# Patient Record
Sex: Male | Born: 1955 | Race: White | Hispanic: No | Marital: Married | Smoking: Never smoker
Health system: Southern US, Community
[De-identification: ages and names within clinical notes are randomized; demographics above are authoritative.]

## PROBLEM LIST (undated history)

## (undated) DIAGNOSIS — F419 Anxiety disorder, unspecified: Secondary | ICD-10-CM

## (undated) DIAGNOSIS — N529 Male erectile dysfunction, unspecified: Secondary | ICD-10-CM

## (undated) DIAGNOSIS — Z8739 Personal history of other diseases of the musculoskeletal system and connective tissue: Secondary | ICD-10-CM

## (undated) DIAGNOSIS — F32A Depression, unspecified: Secondary | ICD-10-CM

## (undated) DIAGNOSIS — E119 Type 2 diabetes mellitus without complications: Secondary | ICD-10-CM

## (undated) DIAGNOSIS — K219 Gastro-esophageal reflux disease without esophagitis: Secondary | ICD-10-CM

## (undated) DIAGNOSIS — I639 Cerebral infarction, unspecified: Secondary | ICD-10-CM

## (undated) DIAGNOSIS — R351 Nocturia: Secondary | ICD-10-CM

## (undated) DIAGNOSIS — R399 Unspecified symptoms and signs involving the genitourinary system: Secondary | ICD-10-CM

## (undated) DIAGNOSIS — E785 Hyperlipidemia, unspecified: Secondary | ICD-10-CM

## (undated) DIAGNOSIS — I10 Essential (primary) hypertension: Secondary | ICD-10-CM

## (undated) DIAGNOSIS — F329 Major depressive disorder, single episode, unspecified: Secondary | ICD-10-CM

## (undated) DIAGNOSIS — T387X1A Poisoning by androgens and anabolic congeners, accidental (unintentional), initial encounter: Secondary | ICD-10-CM

## (undated) DIAGNOSIS — E291 Testicular hypofunction: Secondary | ICD-10-CM

## (undated) DIAGNOSIS — M1990 Unspecified osteoarthritis, unspecified site: Secondary | ICD-10-CM

## (undated) DIAGNOSIS — N400 Enlarged prostate without lower urinary tract symptoms: Secondary | ICD-10-CM

## (undated) DIAGNOSIS — E559 Vitamin D deficiency, unspecified: Secondary | ICD-10-CM

## (undated) HISTORY — DX: Nocturia: R35.1

## (undated) HISTORY — PX: ROTATOR CUFF REPAIR: SHX139

## (undated) HISTORY — DX: Hyperlipidemia, unspecified: E78.5

## (undated) HISTORY — DX: Unspecified osteoarthritis, unspecified site: M19.90

## (undated) HISTORY — PX: COLONOSCOPY: SHX174

## (undated) HISTORY — DX: Personal history of other diseases of the musculoskeletal system and connective tissue: Z87.39

## (undated) HISTORY — DX: Poisoning by androgens and anabolic congeners, accidental (unintentional), initial encounter: T38.7X1A

## (undated) HISTORY — DX: Unspecified symptoms and signs involving the genitourinary system: R39.9

## (undated) HISTORY — DX: Depression, unspecified: F32.A

## (undated) HISTORY — DX: Vitamin D deficiency, unspecified: E55.9

## (undated) HISTORY — PX: COLON SURGERY: SHX602

## (undated) HISTORY — DX: Major depressive disorder, single episode, unspecified: F32.9

## (undated) HISTORY — PX: KNEE ARTHROSCOPY: SUR90

## (undated) HISTORY — DX: Anxiety disorder, unspecified: F41.9

## (undated) HISTORY — DX: Male erectile dysfunction, unspecified: N52.9

## (undated) HISTORY — DX: Testicular hypofunction: E29.1

## (undated) HISTORY — DX: Benign prostatic hyperplasia without lower urinary tract symptoms: N40.0

---

## 1984-11-06 HISTORY — PX: BACK SURGERY: SHX140

## 2005-10-23 ENCOUNTER — Inpatient Hospital Stay: Payer: Self-pay | Admitting: Unknown Physician Specialty

## 2006-07-26 ENCOUNTER — Ambulatory Visit: Payer: Self-pay | Admitting: Internal Medicine

## 2006-11-09 ENCOUNTER — Emergency Department: Payer: Self-pay | Admitting: Emergency Medicine

## 2007-05-23 ENCOUNTER — Ambulatory Visit: Payer: Self-pay | Admitting: Unknown Physician Specialty

## 2007-05-28 ENCOUNTER — Ambulatory Visit: Payer: Self-pay | Admitting: Unknown Physician Specialty

## 2007-07-01 ENCOUNTER — Ambulatory Visit: Payer: Self-pay | Admitting: Unknown Physician Specialty

## 2010-08-31 ENCOUNTER — Ambulatory Visit: Payer: Self-pay

## 2010-09-14 ENCOUNTER — Ambulatory Visit: Payer: Self-pay | Admitting: Unknown Physician Specialty

## 2012-09-13 ENCOUNTER — Other Ambulatory Visit: Payer: Self-pay | Admitting: Unknown Physician Specialty

## 2012-09-13 LAB — SYNOVIAL CELL COUNT + DIFF, W/ CRYSTALS
Crystals, Joint Fluid: NONE SEEN
Nucleated Cell Count: 134061 /mm3
Other Cells BF: 0 %
Other Mononuclear Cells: 3 %

## 2012-09-15 ENCOUNTER — Other Ambulatory Visit: Payer: Self-pay

## 2012-09-15 LAB — BODY FLUID CELL COUNT WITH DIFFERENTIAL
Basophil: 0 %
Eosinophil: 0 %
Lymphocytes: 14 %
Neutrophils: 79 %
Nucleated Cell Count: 66842 /mm3
Other Cells BF: 0 %

## 2012-09-15 LAB — SYNOVIAL FLUID, CRYSTAL: Crystals, Joint Fluid: NONE SEEN

## 2012-09-16 ENCOUNTER — Ambulatory Visit: Payer: Self-pay | Admitting: Unknown Physician Specialty

## 2012-09-17 ENCOUNTER — Ambulatory Visit: Payer: Self-pay | Admitting: Unknown Physician Specialty

## 2012-09-19 LAB — BODY FLUID CULTURE

## 2012-09-26 DIAGNOSIS — M009 Pyogenic arthritis, unspecified: Secondary | ICD-10-CM | POA: Insufficient documentation

## 2012-09-28 LAB — BODY FLUID CULTURE

## 2012-09-29 ENCOUNTER — Other Ambulatory Visit: Payer: Self-pay | Admitting: Specialist

## 2012-09-29 LAB — BASIC METABOLIC PANEL
Anion Gap: 6 — ABNORMAL LOW (ref 7–16)
BUN: 16 mg/dL (ref 7–18)
Calcium, Total: 9.4 mg/dL (ref 8.5–10.1)
Chloride: 105 mmol/L (ref 98–107)
Co2: 29 mmol/L (ref 21–32)
Creatinine: 0.82 mg/dL (ref 0.60–1.30)
Sodium: 140 mmol/L (ref 136–145)

## 2012-09-29 LAB — CBC WITH DIFFERENTIAL/PLATELET
Basophil #: 0 10*3/uL (ref 0.0–0.1)
Basophil %: 0.6 %
Eosinophil #: 0.2 10*3/uL (ref 0.0–0.7)
HCT: 36.5 % — ABNORMAL LOW (ref 40.0–52.0)
Lymphocyte #: 1.4 10*3/uL (ref 1.0–3.6)
MCH: 31.9 pg (ref 26.0–34.0)
MCV: 92 fL (ref 80–100)
Monocyte %: 9.5 %
Neutrophil #: 6 10*3/uL (ref 1.4–6.5)
Platelet: 322 10*3/uL (ref 150–440)
RDW: 11.8 % (ref 11.5–14.5)
WBC: 8.4 10*3/uL (ref 3.8–10.6)

## 2012-09-29 LAB — SEDIMENTATION RATE: Erythrocyte Sed Rate: 76 mm/hr — ABNORMAL HIGH (ref 0–20)

## 2012-09-29 LAB — VANCOMYCIN, TROUGH: Vancomycin, Trough: 12 ug/mL (ref 10–20)

## 2012-10-23 ENCOUNTER — Ambulatory Visit: Payer: Self-pay | Admitting: Unknown Physician Specialty

## 2013-09-12 ENCOUNTER — Ambulatory Visit: Payer: Self-pay | Admitting: Gastroenterology

## 2013-09-12 LAB — CBC WITH DIFFERENTIAL/PLATELET
Basophil #: 0 10*3/uL (ref 0.0–0.1)
Basophil %: 0.4 %
Eosinophil %: 4.2 %
HCT: 40.2 % (ref 40.0–52.0)
Lymphocyte #: 1.3 10*3/uL (ref 1.0–3.6)
MCH: 33 pg (ref 26.0–34.0)
MCHC: 36.1 g/dL — ABNORMAL HIGH (ref 32.0–36.0)
MCV: 92 fL (ref 80–100)
Monocyte #: 0.5 x10 3/mm (ref 0.2–1.0)
Neutrophil #: 2.9 10*3/uL (ref 1.4–6.5)
Neutrophil %: 58.3 %
Platelet: 181 10*3/uL (ref 150–440)

## 2013-09-12 LAB — PROTIME-INR: Prothrombin Time: 13.1 secs (ref 11.5–14.7)

## 2013-09-15 LAB — PATHOLOGY REPORT

## 2013-10-17 ENCOUNTER — Ambulatory Visit: Payer: Self-pay | Admitting: Anesthesiology

## 2013-10-17 LAB — POTASSIUM: Potassium: 3.8 mmol/L (ref 3.5–5.1)

## 2013-11-03 ENCOUNTER — Inpatient Hospital Stay: Payer: Self-pay | Admitting: Surgery

## 2013-11-03 LAB — POTASSIUM: Potassium: 3.8 mmol/L (ref 3.5–5.1)

## 2013-11-04 LAB — BASIC METABOLIC PANEL
Anion Gap: 5 — ABNORMAL LOW (ref 7–16)
BUN: 15 mg/dL (ref 7–18)
Calcium, Total: 9 mg/dL (ref 8.5–10.1)
Chloride: 98 mmol/L (ref 98–107)
Co2: 27 mmol/L (ref 21–32)
Creatinine: 0.81 mg/dL (ref 0.60–1.30)
EGFR (African American): >60
EGFR (Non-African Amer.): >60
Sodium: 130 mmol/L — ABNORMAL LOW (ref 136–145)

## 2013-11-04 LAB — CBC WITH DIFFERENTIAL/PLATELET
Basophil %: 0.1 %
Eosinophil #: 0 10*3/uL (ref 0.0–0.7)
HCT: 41 % (ref 40.0–52.0)
HGB: 14.2 g/dL (ref 13.0–18.0)
Lymphocyte %: 6.1 %
MCH: 32.3 pg (ref 26.0–34.0)
MCHC: 34.8 g/dL (ref 32.0–36.0)
MCV: 93 fL (ref 80–100)
Monocyte #: 1 x10 3/mm (ref 0.2–1.0)
Monocyte %: 7.7 %
Neutrophil #: 10.6 10*3/uL — ABNORMAL HIGH (ref 1.4–6.5)
Neutrophil %: 86.1 %
Platelet: 182 10*3/uL (ref 150–440)
RBC: 4.41 10*6/uL (ref 4.40–5.90)
WBC: 12.4 10*3/uL — ABNORMAL HIGH (ref 3.8–10.6)

## 2013-11-07 LAB — PATHOLOGY REPORT

## 2015-02-23 NOTE — Op Note (Signed)
PATIENT NAME:  Erik [name], Erik [name] MR#:  [id number] DATE OF BIRTH:  23-Jun-1956  DATE OF PROCEDURE:  09/17/2012  PREOPERATIVE DIAGNOSIS:  Septic left knee.  POSTOPERATIVE DIAGNOSIS: Septic left knee.  PROCEDURES:  1. Ultrasound guidance for vascular access to left brachial vein.  2. Fluoroscopic guidance for placement of catheter.  3. Insertion of peripherally inserted central venous catheter, 4-french single lumen, left arm.  SURGEON: Levora DredgeGregory Aldo Sondgeroth, MD  ANESTHESIA: Local.   ESTIMATED BLOOD LOSS: Minimal.   INDICATION FOR PROCEDURE: Requiring antibiotics greater than five days.  DESCRIPTION OF PROCEDURE: The patient's left arm was sterilely prepped and draped, and a sterile surgical field was created. The brachial vein was accessed under direct ultrasound guidance without difficulty with a micropuncture needle and permanent image was recorded. 0.018 wire was then placed into the superior vena cava. Peel-away sheath was placed over the wire. A single lumen peripherally inserted central venous catheter was then placed over the wire and the wire and peel-away sheath were removed. The catheter tip was placed into the superior             vena cava and was secured at the skin at 40 cm with a sterile dressing. The catheter withdrew blood well and flushed easily with heparinized saline. The patient tolerated procedure well. ____________________________ Renford DillsGregory G. Tannie Koskela, MD ggs:slb D: 09/17/2012 11:58:56 ET T: 09/17/2012 12:10:04 ET JOB#: [id number]  cc: Renford DillsGregory G. Nox Talent, MD, <Dictator> Alda BertholdHarold B. Kernodle Jr., MD Rosalyn GessMichael E. Blocker, MD Renford DillsGREGORY G Tiajah Oyster MD ELECTRONICALLY SIGNED 09/17/2012 17:15

## 2015-02-26 NOTE — Op Note (Signed)
PATIENT NAME:  Erik [name], Erik [name] MR#:  [id number] DATE OF BIRTH:  Feb 26, 1956  DATE OF PROCEDURE:  11/03/2013  PREOPERATIVE DIAGNOSIS: A polyp of the cecum, umbilical hernia.   PROCEDURE: Laparoscopic right colectomy, umbilical hernia repair.   SURGEON: Renda Rolls, M.D.   ANESTHESIA: General.   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 He also had a small umbilical hernia, and surgery was recommended for definitive treatment.   DESCRIPTION OF PROCEDURE: The patient was placed on the operating table in the supine position under general endotracheal anesthesia. The abdomen was prepared with ChloraPrep and draped in a sterile manner.   A short incision was made just below the umbilicus and carried down to the deep fascia which was grasped with laryngeal hook and elevated. A Veress needle was inserted, aspirated and irrigated with a saline solution.   Next, the peritoneal cavity was inflated with carbon dioxide. The Veress needle was removed. The 10 mm cannula was inserted. The 10 mm 0-degree laparoscope was inserted to view the peritoneal cavity.   Another incision was made in the epigastrium overlying the transverse colon to insert an 11 mm cannula, another just about 2.5 inches below that one. This was in the upper aspect of the epigastrium so that we had two 11 mm cannulas there, one below the navel, and another 5 mm cannula was inserted in the right lower quadrant.   Initial survey revealed the liver appeared normal. The omentum was reflected in a cephalad direction up to expose the transverse colon. Next, the cecum was identified. The patient was tilted towards the left and dissection was carried out to mobilize the right colon with incision of the lateral peritoneal reflection using the Harmonic scalpel. The appendix was identified and was mobilized, also mobilized the terminal ileum. There did appear to be  a number of attachments in this area and some scarring, and a somewhat tedious dissection was undertaken mobilizing the appendix, terminal ileum, and ascending colon.   Next, a portion of omentum was separated from the transverse colon beginning at the midline extending towards the right and then subsequently divided the omentum in a longitudinal direction somewhat to the right of the midline and then the hepatic flexure of the colon was mobilized with further dissection with the Harmonic scalpel and reaching the point of dissection along the right colic gutter. There was somewhat redundant colon in this area and it did take a somewhat tedious dissection mobilizing the hepatic flexure. Also identified the duodenum and continued to mobilize the right colon moving back and forth between the ascending colon and the transverse colon until there was satisfactory mobilization.   Next, the laparoscopic instruments were removed. The infraumbilical port site and the 5 mm port sites were closed with 4-0 nylon vertical mattress sutures.   Next, an incision was made from one port site to another in the epigastrium and also extended several centimeters inferiorly and this was still in the upper aspect of the epigastrium so that the midline fascia was incised and a hand could be put into the peritoneal cavity and brought the right colon out on to the abdominal wall.   Next, a window was created in the transverse mesentery to the right of the site of the middle colic vessels and began the mesenteric dissection with the Harmonic scalpel.   Next, a window was created in the mesentery of the small bowel just about 4 inches proximal  to the ileocecal valve. The mesenteric dissection was begun with the Harmonic scalpel, and this was carried deeply. I did widen the portion of mesentery removed so that approximately 6 inches of terminal ileum was removed in order to preserve blood supply at the intended anastomosis. The  dissection was continued with the harmonic scalpel finding somewhat large arteries including the ileocolic artery were ligated with 0 chromic and then dividing with the Harmonic scalpel, and also the ileocolic vein was ligated with 0 chromic suture ligature.   Next, the small bowel was brought adjacent to the transverse colon at the intended sites of resection and the small bowel was grasped with Allis clamp and also has a transverse colon grasped with Allis clamp at the tenia coli and brought the 2 portions of bowel side by side, made an enterotomy and a colotomy and introduced the 75 mm GIA stapler to begin the anastomosis along the antimesenteric border of the small bowel and at the tenia coli. The staple line was hemostatic. The anastomosis was completed with application of the TA-60 stapler which was placed perpendicular to the first staple line and the specimen was excised and passed off to a side table. Several small bleeding points were cauterized. The mesenteric defect was closed with running 3-0 chromic. The apex of the staple line was imbricated with 5-0 Vicryl and also a portion of the junction of the staple lines was imbricated with 5-0 Vicryl. The anastomosis looked good and the mesenteric defect was completely closed. A pull suction was placed into what was the right colic gutter and found no collection of blood. It appeared that hemostasis was intact.   Next, gloves, gown, instruments, suction and cautery were exchanged for clean ones and placed 4 sterile towels around the operative site.   Next, the umbilical hernia was repaired with making a transversely oriented supraumbilical incision which was curvilinear and approximately 2.5 cm in length and dissected down to encounter incarcerated properitoneal fatty tissue within the umbilical hernia, and this was dissected free from surrounding structures and was amputated with electrocautery. Hemostasis was intact and the fascial defect was closed  with a 0 Maxon figure-of-eight suture.   Next, this wound was closed with interrupted 4-0 nylon vertical mattress sutures.   Next, the extraction site in the upper abdominal midline was closed and it appeared that hemostasis was intact and instrument count was correct. The midline fascia was closed with interrupted 0 Maxon figure-of-eight sutures and the skin was closed with interrupted 4-0 nylon vertical mattress sutures.   Dressings were applied to all the wounds using 4 x 4 gauze, 2-inch paper tape.   The patient tolerated the procedure satisfactorily and was prepared for transfer to the recovery room.   This case took more than usual amount of timec greater than 3 hours and greater effort and skill due to significant amount of fatty tissue and a significant amount of redundancy involving the transverse colon in addition to the repair of the umbilical hernia.   It should be noted that at the end of the procedure, the segment of colon was incised to open the cecum and did expose the polyp in the cecum and was submitted in formalin for routine pathology.   ____________________________ Shela CommonsJ. Renda RollsWilton Samuel Rittenhouse, MD jws:np D: 11/03/2013 14:31:55 ET T: 11/03/2013 15:20:06 ET JOB#: [id number]  cc: Adella HareJ. Wilton Erandi Lemma, MD, <Dictator> Adella HareWILTON J Kaelan Amble MD ELECTRONICALLY SIGNED 11/05/2013 16:53

## 2015-02-27 NOTE — Discharge Summary (Signed)
PATIENT NAME:  Erik [name], [name] MR#:  [id number] DATE OF BIRTH:  August 20, 1956  DATE OF ADMISSION:  11/03/2013 DATE OF DISCHARGE:  11/06/2013  This 59 year old male had a recent colonoscopy with findings of a large polyp in the cecum. This was a 40 mm sessile tubular tubulovillous adenoma.   PAST MEDICAL HISTORY: Does include: 1.  Reflux esophagitis.  2.  Osteoarthritis.  3.  Depression and anxiety. 4.  Hyperlipidemia.  5.  History of hypertension in the past.  6.  Benign prostatic hypertrophy.   Details are recorded in the typed H and P.    PHYSICAL EXAMINATION:   VITAL SIGNS: BMI was 31, blood pressure 168/80.  ABDOMEN: With mild diastases recti. Also had 2 cm umbilical hernia which was partially reducible.   He was advised to have a laparoscopic right colectomy and also recommended umbilical hernia repair.   He had a bowel preparation at home and came in through the outpatient surgery department. He did have a preop prophylactic antibiotic. Was carried to the operating room where he had a laparoscopic right colectomy and also repair of umbilical hernia.   Postoperatively, he was treated with IV fluids, analgesics, subcutaneous heparin and was begun initially on a clear liquid diet and gradually advanced his diet, which he tolerated satisfactorily.   His final pathology demonstrated a tubulovillous adenoma of the cecum which was 2.8 cm in dimension with high-grade dysplasia. Twelve regional lymph nodes were negative. Appendix was included.   DIAGNOSES:   1.  Tubulovillous adenoma of the right colon. 2.  Umbilical hernia.   OPERATION: Laparoscopic right colectomy and umbilical hernia repair.   DISCHARGE INSTRUCTIONS: Wound care saturations were given and plans made for follow-up in the office.  ____________________________ J. Renda RollsWilton Smith, MD jws:dp D: 11/21/2013 14:08:05 ET T: 11/21/2013 15:31:19 ET JOB#: [id number]  cc: Adella HareJ. Wilton Smith, MD, <Dictator> Adella HareWILTON J SMITH  MD ELECTRONICALLY SIGNED 11/21/2013 20:02

## 2015-04-30 ENCOUNTER — Ambulatory Visit (INDEPENDENT_AMBULATORY_CARE_PROVIDER_SITE_OTHER): Payer: 59 | Admitting: Urology

## 2015-04-30 ENCOUNTER — Encounter: Payer: Self-pay | Admitting: Urology

## 2015-04-30 VITALS — BP 139/74 | HR 71 | Resp 18 | Ht 70.0 in | Wt 200.2 lb

## 2015-04-30 DIAGNOSIS — N400 Enlarged prostate without lower urinary tract symptoms: Secondary | ICD-10-CM | POA: Insufficient documentation

## 2015-04-30 DIAGNOSIS — K219 Gastro-esophageal reflux disease without esophagitis: Secondary | ICD-10-CM | POA: Insufficient documentation

## 2015-04-30 DIAGNOSIS — N138 Other obstructive and reflux uropathy: Secondary | ICD-10-CM | POA: Insufficient documentation

## 2015-04-30 DIAGNOSIS — F32A Depression, unspecified: Secondary | ICD-10-CM | POA: Insufficient documentation

## 2015-04-30 DIAGNOSIS — M1990 Unspecified osteoarthritis, unspecified site: Secondary | ICD-10-CM | POA: Insufficient documentation

## 2015-04-30 DIAGNOSIS — F419 Anxiety disorder, unspecified: Secondary | ICD-10-CM | POA: Insufficient documentation

## 2015-04-30 DIAGNOSIS — N401 Enlarged prostate with lower urinary tract symptoms: Secondary | ICD-10-CM

## 2015-04-30 DIAGNOSIS — I10 Essential (primary) hypertension: Secondary | ICD-10-CM | POA: Insufficient documentation

## 2015-04-30 DIAGNOSIS — E785 Hyperlipidemia, unspecified: Secondary | ICD-10-CM | POA: Insufficient documentation

## 2015-04-30 DIAGNOSIS — F329 Major depressive disorder, single episode, unspecified: Secondary | ICD-10-CM | POA: Insufficient documentation

## 2015-04-30 LAB — BLADDER SCAN AMB NON-IMAGING

## 2015-04-30 NOTE — Progress Notes (Signed)
04/30/2015 9:21 AM   Erik [name] 08/27/1956 [id number]  Referring provider: No referring provider defined for this encounter.  Chief Complaint  Patient presents with  . Medication Management    1 mo f/u    HPI: Erik [name] is a 60 year old white male with BPH with LUTS who was found to have a large residual at his appointment month ago. He denied any suprapubic pain but his IPS S score was 25/3 which is a severe score. He was experiencing incomplete bladder emptying, frequency, intermittency, urgency, weak stream and nocturia 4. We did discuss CIC at that appointment, but patient refused. He wanted to restart his Cialis 5 mg daily in an effort to aid in doing his bladder.  He presents today for follow-up.  His IPSS score improved. It is now 12/2 which is a moderate score.  His PVR is still remained large at 288 mL. He is not experiencing any suprapubic discomfort, urinary tract infections, dysuria or gross hematuria. He refuses CIC at this time.      IPSS      04/30/15 0900       International Prostate Symptom Score   How often have you had the sensation of not emptying your bladder? Less than half the time     How often have you had to urinate less than every two hours? Less than half the time     How often have you found you stopped and started again several times when you urinated? Less than half the time     How often have you found it difficult to postpone urination? Less than 1 in 5 times     How often have you had a weak urinary stream? Less than half the time     How often have you had to strain to start urination? Less than 1 in 5 times     How many times did you typically get up at night to urinate? 2 Times     Total IPSS Score 12     Quality of Life due to urinary symptoms   If you were to spend the rest of your life with your urinary condition just the way it is now how would you feel about that? 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  He has been advised of the dangers of abusing testosterone.    PMH: Past Medical History  Diagnosis Date  . Nocturia   . HLD (hyperlipidemia)   . ED (erectile dysfunction)   . Anxiety   . H/O calcium pyrophosphate deposition disease (CPPD)   . Lower urinary tract symptoms (LUTS)   . BPH (benign prostatic hyperplasia)   . Testosterone overdose   . Hypogonadism in male   . Vitamin D deficiency     Surgical History: Past Surgical History  Procedure Laterality Date  . Knee arthroscopy Bilateral     Home Medications:    Medication List       This list is accurate as of: 04/30/15  9:21 AM.  Always use your most recent med list.               amLODipine 10 MG tablet  Commonly known as:  NORVASC  Take 10 mg by mouth daily.     CRESTOR 40 MG tablet  Generic drug:  rosuvastatin  TAKE 1 TABLET (40 MG TOTAL) BY  MOUTH NIGHTLY.     FLUoxetine 10 MG capsule  Commonly known as:  PROZAC  Take 10 mg by mouth daily.     hydrochlorothiazide 25 MG tablet  Commonly known as:  HYDRODIURIL  TAKE 1 TABLET (25 MG TOTAL) BY MOUTH ONCE DAILY.     losartan 25 MG tablet  Commonly known as:  COZAAR  TAKE 1 TABLET (25 MG TOTAL) BY MOUTH ONCE DAILY.     multivitamin tablet  Take 1 tablet by mouth daily.     nabumetone 750 MG tablet  Commonly known as:  RELAFEN  Take 750 mg by mouth daily.     omeprazole 40 MG capsule  Commonly known as:  PRILOSEC  TAKE 1 CAPSULE (40 MG TOTAL) BY MOUTH ONCE DAILY.     phentermine 15 MG capsule  TAKE 1 CAPSULE BY MOUTH EVERY MORNING BEFORE BREAKFAST     tadalafil 5 MG tablet  Commonly known as:  CIALIS  Take 5 mg by mouth daily as needed for erectile dysfunction.        Allergies:  Allergies  Allergen Reactions  . Celecoxib Nausea And Vomiting  . Shellfish-Derived Products Hives    Family History: No family history on file.  Social History:  reports that he has  never smoked. He does not have any smokeless tobacco history on file. He reports that he drinks alcohol. His drug history is not on file.  ROS: Urological Symptom Review  Patient is experiencing the following symptoms: Frequent urination Get up at night to urinate Stream starts and stops   Review of Systems  Gastrointestinal (upper)  : Negative for upper GI symptoms  Gastrointestinal (lower) : Negative for lower GI symptoms  Constitutional : Negative for symptoms  Skin: Negative for skin symptoms  Eyes: Negative for eye symptoms  Ear/Nose/Throat : Negative for Ear/Nose/Throat symptoms  Hematologic/Lymphatic: Negative for Hematologic/Lymphatic symptoms  Cardiovascular : Negative for cardiovascular symptoms  Respiratory : Negative for respiratory symptoms  Endocrine: Negative for endocrine symptoms  Musculoskeletal: Negative for musculoskeletal symptoms  Neurological: Negative for neurological symptoms  Psychologic: Negative for psychiatric symptoms   Physical Exam: BP 139/74 mmHg  Pulse 71  Resp 18  Ht  (1.778 m)  Wt 200 lb 3.2 oz (90.81 kg)  BMI 28.73 kg/m2   Laboratory Data: Results for orders placed or performed in visit on 04/30/15  BLADDER SCAN AMB NON-IMAGING  Result Value Ref Range   Scan Result     Lab Results  Component Value Date   WBC 12.4* 11/04/2013   HGB 14.2 11/04/2013   HCT 41.0 11/04/2013   MCV 93 11/04/2013   PLT 182 11/04/2013    Lab Results  Component Value Date   CREATININE 0.81 11/04/2013    No results found for: PSA  No results found for: TESTOSTERONE  No results found for: HGBA1C  Urinalysis No results found for: COLORURINE, APPEARANCEUR, LABSPEC, PHURINE, GLUCOSEU, HGBUR, BILIRUBINUR, KETONESUR, PROTEINUR, UROBILINOGEN, NITRITE, LEUKOCYTESUR  Pertinent Imaging:   Assessment & Plan:    1. BPH (benign prostatic hyperplasia) with LUTS:  Patient continues to have large residuals, but his  urinary symptoms have improved. He does not want any further testing or to learn CIC at this time.  Patient was warned of the possibility of urinary retention and the dangers of having a high residual of urine. (Cr 0.86 on 03/30/2015)  He will continue the Cialis 5 mg 1 tablet daily. He will return in one years time for IPS S score,  PVR, DRE and PSA.  - BLADDER SCAN AMB NON-IMAGING  PSA History:    0.3 ng/mL on 03/03/2013    0.2 ng/mL on 03/30/2015  2. Testosterone abuse:  Patient has been advised of the risks of using testosterone without the supervision of a medical provider.  No Follow-up on file.  Michiel Cowboy, PA-C  [facility] [address] [place], [place] [address] [phone]

## 2016-04-28 ENCOUNTER — Ambulatory Visit: Payer: Managed Care, Other (non HMO) | Admitting: Urology

## 2016-04-28 ENCOUNTER — Encounter: Payer: Self-pay | Admitting: Urology

## 2017-02-04 DIAGNOSIS — E119 Type 2 diabetes mellitus without complications: Secondary | ICD-10-CM

## 2017-02-04 HISTORY — DX: Type 2 diabetes mellitus without complications: E11.9

## 2017-02-16 ENCOUNTER — Emergency Department (HOSPITAL_COMMUNITY): Payer: Managed Care, Other (non HMO)

## 2017-02-16 ENCOUNTER — Inpatient Hospital Stay (HOSPITAL_COMMUNITY): Payer: Managed Care, Other (non HMO)

## 2017-02-16 ENCOUNTER — Emergency Department (HOSPITAL_COMMUNITY): Payer: Managed Care, Other (non HMO) | Admitting: Certified Registered"

## 2017-02-16 ENCOUNTER — Inpatient Hospital Stay (HOSPITAL_COMMUNITY)
Admission: EM | Admit: 2017-02-16 | Discharge: 2017-02-22 | DRG: 023 | Disposition: A | Discharge status: Rehabilitation Facility | Payer: Managed Care, Other (non HMO) | Attending: Neurology | Admitting: Neurology

## 2017-02-16 ENCOUNTER — Encounter (HOSPITAL_COMMUNITY)
Admission: EM | Disposition: A | Discharge status: Rehabilitation Facility | Payer: Self-pay | Source: Home / Self Care | Attending: Neurology

## 2017-02-16 ENCOUNTER — Encounter (HOSPITAL_COMMUNITY): Payer: Self-pay | Admitting: Interventional Radiology

## 2017-02-16 DIAGNOSIS — E1165 Type 2 diabetes mellitus with hyperglycemia: Secondary | ICD-10-CM | POA: Diagnosis present

## 2017-02-16 DIAGNOSIS — I63032 Cerebral infarction due to thrombosis of left carotid artery: Secondary | ICD-10-CM | POA: Diagnosis not present

## 2017-02-16 DIAGNOSIS — R471 Dysarthria and anarthria: Secondary | ICD-10-CM | POA: Diagnosis present

## 2017-02-16 DIAGNOSIS — R402222 Coma scale, best verbal response, incomprehensible words, at arrival to emergency department: Secondary | ICD-10-CM | POA: Diagnosis present

## 2017-02-16 DIAGNOSIS — I63031 Cerebral infarction due to thrombosis of right carotid artery: Secondary | ICD-10-CM

## 2017-02-16 DIAGNOSIS — I6522 Occlusion and stenosis of left carotid artery: Secondary | ICD-10-CM | POA: Diagnosis not present

## 2017-02-16 DIAGNOSIS — Z8249 Family history of ischemic heart disease and other diseases of the circulatory system: Secondary | ICD-10-CM | POA: Diagnosis not present

## 2017-02-16 DIAGNOSIS — I69991 Dysphagia following unspecified cerebrovascular disease: Secondary | ICD-10-CM | POA: Diagnosis not present

## 2017-02-16 DIAGNOSIS — K219 Gastro-esophageal reflux disease without esophagitis: Secondary | ICD-10-CM | POA: Diagnosis present

## 2017-02-16 DIAGNOSIS — I10 Essential (primary) hypertension: Secondary | ICD-10-CM | POA: Diagnosis present

## 2017-02-16 DIAGNOSIS — N401 Enlarged prostate with lower urinary tract symptoms: Secondary | ICD-10-CM | POA: Diagnosis present

## 2017-02-16 DIAGNOSIS — I63312 Cerebral infarction due to thrombosis of left middle cerebral artery: Secondary | ICD-10-CM | POA: Diagnosis present

## 2017-02-16 DIAGNOSIS — R29810 Facial weakness: Secondary | ICD-10-CM | POA: Diagnosis present

## 2017-02-16 DIAGNOSIS — E785 Hyperlipidemia, unspecified: Secondary | ICD-10-CM | POA: Diagnosis present

## 2017-02-16 DIAGNOSIS — I639 Cerebral infarction, unspecified: Secondary | ICD-10-CM | POA: Diagnosis present

## 2017-02-16 DIAGNOSIS — I63512 Cerebral infarction due to unspecified occlusion or stenosis of left middle cerebral artery: Secondary | ICD-10-CM | POA: Diagnosis not present

## 2017-02-16 DIAGNOSIS — I63239 Cerebral infarction due to unspecified occlusion or stenosis of unspecified carotid arteries: Secondary | ICD-10-CM | POA: Diagnosis not present

## 2017-02-16 DIAGNOSIS — G936 Cerebral edema: Secondary | ICD-10-CM | POA: Diagnosis present

## 2017-02-16 DIAGNOSIS — R402362 Coma scale, best motor response, obeys commands, at arrival to emergency department: Secondary | ICD-10-CM | POA: Diagnosis present

## 2017-02-16 DIAGNOSIS — R351 Nocturia: Secondary | ICD-10-CM | POA: Diagnosis present

## 2017-02-16 DIAGNOSIS — I6529 Occlusion and stenosis of unspecified carotid artery: Secondary | ICD-10-CM | POA: Diagnosis not present

## 2017-02-16 DIAGNOSIS — R1310 Dysphagia, unspecified: Secondary | ICD-10-CM | POA: Diagnosis present

## 2017-02-16 DIAGNOSIS — I69320 Aphasia following cerebral infarction: Secondary | ICD-10-CM | POA: Diagnosis not present

## 2017-02-16 DIAGNOSIS — R4701 Aphasia: Secondary | ICD-10-CM

## 2017-02-16 DIAGNOSIS — N138 Other obstructive and reflux uropathy: Secondary | ICD-10-CM | POA: Diagnosis present

## 2017-02-16 DIAGNOSIS — Z6831 Body mass index (BMI) 31.0-31.9, adult: Secondary | ICD-10-CM | POA: Diagnosis not present

## 2017-02-16 DIAGNOSIS — R29726 NIHSS score 26: Secondary | ICD-10-CM | POA: Diagnosis present

## 2017-02-16 DIAGNOSIS — Z833 Family history of diabetes mellitus: Secondary | ICD-10-CM | POA: Diagnosis not present

## 2017-02-16 DIAGNOSIS — E669 Obesity, unspecified: Secondary | ICD-10-CM | POA: Diagnosis present

## 2017-02-16 DIAGNOSIS — E119 Type 2 diabetes mellitus without complications: Secondary | ICD-10-CM | POA: Diagnosis not present

## 2017-02-16 DIAGNOSIS — D62 Acute posthemorrhagic anemia: Secondary | ICD-10-CM | POA: Diagnosis not present

## 2017-02-16 DIAGNOSIS — G8191 Hemiplegia, unspecified affecting right dominant side: Secondary | ICD-10-CM | POA: Diagnosis present

## 2017-02-16 DIAGNOSIS — I6521 Occlusion and stenosis of right carotid artery: Secondary | ICD-10-CM | POA: Diagnosis not present

## 2017-02-16 DIAGNOSIS — I6523 Occlusion and stenosis of bilateral carotid arteries: Secondary | ICD-10-CM | POA: Diagnosis not present

## 2017-02-16 DIAGNOSIS — Z96653 Presence of artificial knee joint, bilateral: Secondary | ICD-10-CM | POA: Diagnosis present

## 2017-02-16 DIAGNOSIS — J9601 Acute respiratory failure with hypoxia: Secondary | ICD-10-CM | POA: Diagnosis not present

## 2017-02-16 DIAGNOSIS — R402142 Coma scale, eyes open, spontaneous, at arrival to emergency department: Secondary | ICD-10-CM | POA: Diagnosis present

## 2017-02-16 DIAGNOSIS — J9690 Respiratory failure, unspecified, unspecified whether with hypoxia or hypercapnia: Secondary | ICD-10-CM

## 2017-02-16 DIAGNOSIS — R001 Bradycardia, unspecified: Secondary | ICD-10-CM | POA: Diagnosis not present

## 2017-02-16 DIAGNOSIS — I69391 Dysphagia following cerebral infarction: Secondary | ICD-10-CM

## 2017-02-16 DIAGNOSIS — Z9911 Dependence on respirator [ventilator] status: Secondary | ICD-10-CM | POA: Diagnosis not present

## 2017-02-16 DIAGNOSIS — J9600 Acute respiratory failure, unspecified whether with hypoxia or hypercapnia: Secondary | ICD-10-CM | POA: Diagnosis not present

## 2017-02-16 DIAGNOSIS — I63232 Cerebral infarction due to unspecified occlusion or stenosis of left carotid arteries: Secondary | ICD-10-CM

## 2017-02-16 DIAGNOSIS — F809 Developmental disorder of speech and language, unspecified: Secondary | ICD-10-CM | POA: Diagnosis not present

## 2017-02-16 DIAGNOSIS — G8194 Hemiplegia, unspecified affecting left nondominant side: Secondary | ICD-10-CM | POA: Diagnosis not present

## 2017-02-16 DIAGNOSIS — I6789 Other cerebrovascular disease: Secondary | ICD-10-CM | POA: Diagnosis not present

## 2017-02-16 DIAGNOSIS — I459 Conduction disorder, unspecified: Secondary | ICD-10-CM | POA: Diagnosis not present

## 2017-02-16 DIAGNOSIS — N400 Enlarged prostate without lower urinary tract symptoms: Secondary | ICD-10-CM | POA: Diagnosis not present

## 2017-02-16 DIAGNOSIS — Z4659 Encounter for fitting and adjustment of other gastrointestinal appliance and device: Secondary | ICD-10-CM

## 2017-02-16 DIAGNOSIS — Z96659 Presence of unspecified artificial knee joint: Secondary | ICD-10-CM | POA: Diagnosis not present

## 2017-02-16 DIAGNOSIS — E1159 Type 2 diabetes mellitus with other circulatory complications: Secondary | ICD-10-CM | POA: Diagnosis not present

## 2017-02-16 HISTORY — PX: IR PTA NON CORO-LOWER EXTREM: IMG6142

## 2017-02-16 HISTORY — PX: IR PERCUTANEOUS ART THROMBECTOMY/INFUSION INTRACRANIAL INC DIAG ANGIO: IMG6087

## 2017-02-16 HISTORY — PX: RADIOLOGY WITH ANESTHESIA: SHX6223

## 2017-02-16 HISTORY — PX: IR US GUIDE VASC ACCESS LEFT: IMG2389

## 2017-02-16 HISTORY — PX: IR ANGIO INTRA EXTRACRAN SEL COM CAROTID INNOMINATE UNI R MOD SED: IMG5359

## 2017-02-16 HISTORY — PX: IR US GUIDE VASC ACCESS RIGHT: IMG2390

## 2017-02-16 LAB — I-STAT CHEM 8, ED
BUN: 24 mg/dL — AB (ref 6–20)
CHLORIDE: 101 mmol/L (ref 101–111)
Calcium, Ion: 1.16 mmol/L (ref 1.15–1.40)
Creatinine, Ser: 1 mg/dL (ref 0.61–1.24)
Glucose, Bld: 207 mg/dL — ABNORMAL HIGH (ref 65–99)
HEMATOCRIT: 39 % (ref 39.0–52.0)
Hemoglobin: 13.3 g/dL (ref 13.0–17.0)
Potassium: 3.6 mmol/L (ref 3.5–5.1)
SODIUM: 138 mmol/L (ref 135–145)
TCO2: 26 mmol/L (ref 0–100)

## 2017-02-16 LAB — CBC
HCT: 38.2 % — ABNORMAL LOW (ref 39.0–52.0)
HEMOGLOBIN: 13.2 g/dL (ref 13.0–17.0)
MCH: 31.4 pg (ref 26.0–34.0)
MCHC: 34.6 g/dL (ref 30.0–36.0)
MCV: 91 fL (ref 78.0–100.0)
PLATELETS: 207 10*3/uL (ref 150–400)
RBC: 4.2 MIL/uL — AB (ref 4.22–5.81)
RDW: 12.9 % (ref 11.5–15.5)
WBC: 6.6 10*3/uL (ref 4.0–10.5)

## 2017-02-16 LAB — COMPREHENSIVE METABOLIC PANEL
ALBUMIN: 4.4 g/dL (ref 3.5–5.0)
ALT: 29 U/L (ref 17–63)
ANION GAP: 11 (ref 5–15)
AST: 30 U/L (ref 15–41)
Alkaline Phosphatase: 33 U/L — ABNORMAL LOW (ref 38–126)
BUN: 22 mg/dL — AB (ref 6–20)
CO2: 23 mmol/L (ref 22–32)
Calcium: 9.3 mg/dL (ref 8.9–10.3)
Chloride: 102 mmol/L (ref 101–111)
Creatinine, Ser: 0.97 mg/dL (ref 0.61–1.24)
GFR calc Af Amer: >60 mL/min (ref >60)
GFR calc non Af Amer: >60 mL/min (ref >60)
GLUCOSE: 206 mg/dL — AB (ref 65–99)
POTASSIUM: 3.7 mmol/L (ref 3.5–5.1)
SODIUM: 136 mmol/L (ref 135–145)
Total Bilirubin: 0.6 mg/dL (ref 0.3–1.2)
Total Protein: 6.9 g/dL (ref 6.5–8.1)

## 2017-02-16 LAB — POCT I-STAT 3, ART BLOOD GAS (G3+)
BICARBONATE: 25.8 mmol/L (ref 20.0–28.0)
O2 Saturation: 99 %
PH ART: 7.346 — AB (ref 7.350–7.450)
PO2 ART: 161 mmHg — AB (ref 83.0–108.0)
Patient temperature: 98.6
TCO2: 27 mmol/L (ref 0–100)
pCO2 arterial: 47.2 mmHg (ref 32.0–48.0)

## 2017-02-16 LAB — DIFFERENTIAL
BASOS ABS: 0 10*3/uL (ref 0.0–0.1)
Basophils Relative: 0 %
EOS ABS: 0.1 10*3/uL (ref 0.0–0.7)
EOS PCT: 2 %
LYMPHS ABS: 2.4 10*3/uL (ref 0.7–4.0)
LYMPHS PCT: 36 %
Monocytes Absolute: 0.8 10*3/uL (ref 0.1–1.0)
Monocytes Relative: 13 %
NEUTROS PCT: 49 %
Neutro Abs: 3.3 10*3/uL (ref 1.7–7.7)

## 2017-02-16 LAB — LIPID PANEL
CHOLESTEROL: 135 mg/dL (ref 0–200)
HDL: 28 mg/dL — ABNORMAL LOW (ref >40)
LDL Cholesterol: 78 mg/dL (ref 0–99)
Total CHOL/HDL Ratio: 4.8 RATIO
Triglycerides: 144 mg/dL (ref <150)
VLDL: 29 mg/dL (ref 0–40)

## 2017-02-16 LAB — HIV ANTIBODY (ROUTINE TESTING W REFLEX): HIV SCREEN 4TH GENERATION: NONREACTIVE

## 2017-02-16 LAB — PROTIME-INR
INR: 0.97
PROTHROMBIN TIME: 12.9 s (ref 11.4–15.2)

## 2017-02-16 LAB — APTT: APTT: 22 s — AB (ref 24–36)

## 2017-02-16 LAB — CBG MONITORING, ED
GLUCOSE-CAPILLARY: 206 mg/dL — AB (ref 65–99)
GLUCOSE-CAPILLARY: 238 mg/dL — AB (ref 65–99)

## 2017-02-16 LAB — GLUCOSE, CAPILLARY
GLUCOSE-CAPILLARY: 138 mg/dL — AB (ref 65–99)
GLUCOSE-CAPILLARY: 110 mg/dL — AB (ref 65–99)
Glucose-Capillary: 160 mg/dL — ABNORMAL HIGH (ref 65–99)

## 2017-02-16 LAB — MRSA PCR SCREENING: MRSA by PCR: NEGATIVE

## 2017-02-16 LAB — HEPARIN LEVEL (UNFRACTIONATED): HEPARIN UNFRACTIONATED: 0.2 [IU]/mL — AB (ref 0.30–0.70)

## 2017-02-16 LAB — I-STAT TROPONIN, ED: Troponin i, poc: 0 ng/mL (ref 0.00–0.08)

## 2017-02-16 SURGERY — RADIOLOGY WITH ANESTHESIA
Anesthesia: General

## 2017-02-16 MED ORDER — SODIUM CHLORIDE 0.9 % IV BOLUS (SEPSIS)
500.0000 mL | Freq: Once | INTRAVENOUS | Status: AC
  Administered 2017-02-16: 500 mL via INTRAVENOUS

## 2017-02-16 MED ORDER — SODIUM CHLORIDE 0.9 % IV SOLN: 50.0000 mL | Freq: Once | INTRAVENOUS | Status: DC

## 2017-02-16 MED ORDER — STROKE: EARLY STAGES OF RECOVERY BOOK
Freq: Once | Status: AC
  Administered 2017-02-16: 06:00:00
  Filled 2017-02-16: qty 1

## 2017-02-16 MED ORDER — PROPOFOL 10 MG/ML IV BOLUS
INTRAVENOUS | Status: DC | PRN
  Administered 2017-02-16: 100 mg via INTRAVENOUS
  Administered 2017-02-16: 50 mg via INTRAVENOUS
  Administered 2017-02-16: 150 mg via INTRAVENOUS

## 2017-02-16 MED ORDER — ACETAMINOPHEN 325 MG PO TABS
650.0000 mg | ORAL_TABLET | ORAL | Status: DC | PRN
Start: 2017-02-16 — End: 2017-02-18

## 2017-02-16 MED ORDER — PROPOFOL 10 MG/ML IV BOLUS
INTRAVENOUS | Status: AC
  Filled 2017-02-16: qty 20

## 2017-02-16 MED ORDER — CHLORHEXIDINE GLUCONATE 0.12% ORAL RINSE (MEDLINE KIT)
15.0000 mL | Freq: Two times a day (BID) | OROMUCOSAL | Status: DC
  Administered 2017-02-16 – 2017-02-17 (×3): 15 mL via OROMUCOSAL

## 2017-02-16 MED ORDER — NITROGLYCERIN 1 MG/10 ML FOR IR/CATH LAB
INTRA_ARTERIAL | Status: AC
  Filled 2017-02-16: qty 10

## 2017-02-16 MED ORDER — NICARDIPINE HCL IN NACL 20-0.86 MG/200ML-% IV SOLN: 0.0000 mg/h | INTRAVENOUS | Status: DC

## 2017-02-16 MED ORDER — ACETAMINOPHEN 160 MG/5ML PO SOLN: 650.0000 mg | ORAL | Status: DC | PRN

## 2017-02-16 MED ORDER — SUFENTANIL CITRATE 50 MCG/ML IV SOLN
INTRAVENOUS | Status: AC
  Filled 2017-02-16: qty 1

## 2017-02-16 MED ORDER — EPTIFIBATIDE 20 MG/10ML IV SOLN
INTRAVENOUS | Status: AC
  Filled 2017-02-16: qty 10

## 2017-02-16 MED ORDER — OXYCODONE HCL 5 MG/5ML PO SOLN
5.0000 mg | Freq: Once | ORAL | Status: DC | PRN
Start: 2017-02-16 — End: 2017-02-16

## 2017-02-16 MED ORDER — ALBUMIN HUMAN 5 % IV SOLN
12.5000 g | Freq: Four times a day (QID) | INTRAVENOUS | Status: AC
  Administered 2017-02-16 – 2017-02-17 (×4): 12.5 g via INTRAVENOUS
  Filled 2017-02-16 (×4): qty 250

## 2017-02-16 MED ORDER — IOPAMIDOL (ISOVUE-300) INJECTION 61%
INTRAVENOUS | Status: AC
  Administered 2017-02-16: 90 mL
  Filled 2017-02-16: qty 150

## 2017-02-16 MED ORDER — SODIUM CHLORIDE 0.9 % IV SOLN
INTRAVENOUS | Status: DC
  Administered 2017-02-16: 07:00:00 via INTRAVENOUS

## 2017-02-16 MED ORDER — SUFENTANIL CITRATE 50 MCG/ML IV SOLN
INTRAVENOUS | Status: DC | PRN
  Administered 2017-02-16 (×2): 20 ug via INTRAVENOUS
  Administered 2017-02-16: 10 ug via INTRAVENOUS

## 2017-02-16 MED ORDER — ONDANSETRON HCL 4 MG/2ML IJ SOLN: 4.0000 mg | Freq: Four times a day (QID) | INTRAMUSCULAR | Status: DC | PRN

## 2017-02-16 MED ORDER — PANTOPRAZOLE SODIUM 40 MG IV SOLR
40.0000 mg | Freq: Every day | INTRAVENOUS | Status: DC
  Administered 2017-02-16 – 2017-02-17 (×2): 40 mg via INTRAVENOUS
  Filled 2017-02-16 (×2): qty 40

## 2017-02-16 MED ORDER — IOPAMIDOL (ISOVUE-370) INJECTION 76%
INTRAVENOUS | Status: AC
  Administered 2017-02-16: 50 mL
  Filled 2017-02-16: qty 50

## 2017-02-16 MED ORDER — SODIUM CHLORIDE 0.9 % IV SOLN: 250.0000 mL | INTRAVENOUS | Status: DC | PRN

## 2017-02-16 MED ORDER — LACTATED RINGERS IV SOLN
INTRAVENOUS | Status: DC | PRN
  Administered 2017-02-16 (×2): via INTRAVENOUS

## 2017-02-16 MED ORDER — INSULIN ASPART 100 UNIT/ML ~~LOC~~ SOLN
0.0000 [IU] | Freq: Three times a day (TID) | SUBCUTANEOUS | Status: DC
  Administered 2017-02-16: 2 [IU] via SUBCUTANEOUS
  Administered 2017-02-16: 3 [IU] via SUBCUTANEOUS
  Administered 2017-02-17: 2 [IU] via SUBCUTANEOUS

## 2017-02-16 MED ORDER — ALTEPLASE (STROKE) FULL DOSE INFUSION
0.9000 mg/kg | Freq: Once | INTRAVENOUS | Status: AC
  Administered 2017-02-16: 90 mg via INTRAVENOUS
  Filled 2017-02-16: qty 100

## 2017-02-16 MED ORDER — ASPIRIN 325 MG PO TABS
ORAL_TABLET | ORAL | Status: AC
  Filled 2017-02-16: qty 1

## 2017-02-16 MED ORDER — PHENYLEPHRINE HCL 10 MG/ML IJ SOLN
INTRAVENOUS | Status: DC | PRN
  Administered 2017-02-16: 6.6 ug/min via INTRAVENOUS

## 2017-02-16 MED ORDER — LIDOCAINE HCL (CARDIAC) 20 MG/ML IV SOLN
INTRAVENOUS | Status: DC | PRN
  Administered 2017-02-16: 100 mg via INTRATRACHEAL

## 2017-02-16 MED ORDER — OXYCODONE HCL 5 MG PO TABS: 5.0000 mg | ORAL_TABLET | Freq: Once | ORAL | Status: DC | PRN

## 2017-02-16 MED ORDER — FENTANYL CITRATE (PF) 100 MCG/2ML IJ SOLN: 100.0000 ug | INTRAMUSCULAR | Status: DC | PRN

## 2017-02-16 MED ORDER — INSULIN ASPART 100 UNIT/ML ~~LOC~~ SOLN: 0.0000 [IU] | Freq: Every day | SUBCUTANEOUS | Status: DC

## 2017-02-16 MED ORDER — SUCCINYLCHOLINE CHLORIDE 20 MG/ML IJ SOLN
INTRAMUSCULAR | Status: DC | PRN
  Administered 2017-02-16: 50 mg via INTRAVENOUS
  Administered 2017-02-16: 120 mg via INTRAVENOUS

## 2017-02-16 MED ORDER — ACETAMINOPHEN 650 MG RE SUPP: 650.0000 mg | RECTAL | Status: DC | PRN

## 2017-02-16 MED ORDER — ORAL CARE MOUTH RINSE
15.0000 mL | OROMUCOSAL | Status: DC
  Administered 2017-02-16 – 2017-02-17 (×15): 15 mL via OROMUCOSAL

## 2017-02-16 MED ORDER — ALTEPLASE (STROKE) FULL DOSE INFUSION
0.9000 mg/kg | Freq: Once | INTRAVENOUS | Status: DC
  Administered 2017-02-16: 81 mg via INTRAVENOUS

## 2017-02-16 MED ORDER — PROPOFOL 1000 MG/100ML IV EMUL
INTRAVENOUS | Status: AC
  Administered 2017-02-16: 25 ug/kg/min via INTRAVENOUS
  Filled 2017-02-16: qty 100

## 2017-02-16 MED ORDER — CLOPIDOGREL BISULFATE 300 MG PO TABS
ORAL_TABLET | ORAL | Status: AC
Start: 2017-02-16 — End: 2017-02-16
  Filled 2017-02-16: qty 1

## 2017-02-16 MED ORDER — PROPOFOL 1000 MG/100ML IV EMUL
0.0000 ug/kg/min | INTRAVENOUS | Status: DC
  Administered 2017-02-16: 25 ug/kg/min via INTRAVENOUS
  Administered 2017-02-16: 50 ug/kg/min via INTRAVENOUS
  Administered 2017-02-16: 25 ug/kg/min via INTRAVENOUS
  Administered 2017-02-17: 50 ug/kg/min via INTRAVENOUS
  Administered 2017-02-17: 25 ug/kg/min via INTRAVENOUS
  Filled 2017-02-16 (×5): qty 100

## 2017-02-16 MED ORDER — ROCURONIUM 10MG/ML (10ML) SYRINGE FOR MEDFUSION PUMP - OPTIME
INTRAVENOUS | Status: DC | PRN
  Administered 2017-02-16 (×3): 50 mg via INTRAVENOUS

## 2017-02-16 MED ORDER — FENTANYL CITRATE (PF) 100 MCG/2ML IJ SOLN: 25.0000 ug | INTRAMUSCULAR | Status: DC | PRN

## 2017-02-16 MED ORDER — ROSUVASTATIN CALCIUM 20 MG PO TABS
40.0000 mg | ORAL_TABLET | Freq: Every day | ORAL | Status: DC
  Administered 2017-02-16 – 2017-02-18 (×3): 40 mg
  Filled 2017-02-16 (×3): qty 1

## 2017-02-16 MED ORDER — SUFENTANIL CITRATE 50 MCG/ML IV SOLN
INTRAVENOUS | Status: AC
Start: 2017-02-16 — End: 2017-02-16
  Filled 2017-02-16: qty 1

## 2017-02-16 NOTE — ED Provider Notes (Signed)
[facility]-EMERGENCY DEPT Provider Note   CSN: [id number] Arrival date & time: 02/16/17  0037  By signing my name below, I, Octavia Heir, attest that this documentation has been prepared under the direction and in the presence of Aalyssa Elderkin, MD.  Electronically Signed: Octavia Heir, ED Scribe. 02/16/17. 1:18 AM.    History   Chief Complaint No chief complaint on file.  LEVEL V CAVEAT: HPI and ROS limited due to acuity of condition.  The history is provided by the EMS personnel and the spouse. No language interpreter was used.  Cerebrovascular Accident  This is a new problem. The current episode started 1 to 2 hours ago. The problem occurs rarely. The problem has been gradually improving. Pertinent negatives include no chest pain. Nothing aggravates the symptoms. Nothing relieves the symptoms. He has tried nothing for the symptoms. The treatment provided no relief.   HPI Comments: Erik [name] is a 61 y.o. male brought in by ambulance, who presents to the Emergency Department presenting with as a code stroke. Last seen normal was ~ 11:30pm. Per EMS, pt was found in the bathroom laying on his knees. Per wife and EMS, pt is having significant left sided facial droop and aphasia.   Past Medical History:  Diagnosis Date  . Anxiety   . BPH (benign prostatic hyperplasia)   . ED (erectile dysfunction)   . H/O calcium pyrophosphate deposition disease (CPPD)   . HLD (hyperlipidemia)   . Hypogonadism in male   . Lower urinary tract symptoms (LUTS)   . Nocturia   . Testosterone overdose   . Vitamin D deficiency     Patient Active Problem List   Diagnosis Date Noted  . Anxiety 04/30/2015  . Benign fibroma of prostate 04/30/2015  . Clinical depression 04/30/2015  . Acid reflux 04/30/2015  . HLD (hyperlipidemia) 04/30/2015  . BP (high blood pressure) 04/30/2015  . Arthritis, degenerative 04/30/2015  . 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 Arthritis,  septic, knee (HCC) 09/26/2012    Past Surgical History:  Procedure Laterality Date  . KNEE ARTHROSCOPY Bilateral        Home Medications    Prior to Admission medications   Medication Sig Start Date End Date Taking? Authorizing Provider  amLODipine (NORVASC) 10 MG tablet Take 10 mg by mouth daily. 03/24/15   Historical Provider, MD  CRESTOR 40 MG tablet TAKE 1 TABLET (40 MG TOTAL) BY MOUTH NIGHTLY. 03/24/15   Historical Provider, MD  FLUoxetine (PROZAC) 10 MG capsule Take 10 mg by mouth daily.    Historical Provider, MD  hydrochlorothiazide (HYDRODIURIL) 25 MG tablet TAKE 1 TABLET (25 MG TOTAL) BY MOUTH ONCE DAILY. 03/24/15   Historical Provider, MD  losartan (COZAAR) 25 MG tablet TAKE 1 TABLET (25 MG TOTAL) BY MOUTH ONCE DAILY. 03/27/15   Historical Provider, MD  Multiple Vitamin (MULTIVITAMIN) tablet Take 1 tablet by mouth daily.    Historical Provider, MD  nabumetone (RELAFEN) 750 MG tablet Take 750 mg by mouth daily.    Historical Provider, MD  omeprazole (PRILOSEC) 40 MG capsule TAKE 1 CAPSULE (40 MG TOTAL) BY MOUTH ONCE DAILY. 03/24/15   Historical Provider, MD  phentermine 15 MG capsule TAKE 1 CAPSULE BY MOUTH EVERY MORNING BEFORE BREAKFAST 04/10/15   Historical Provider, MD  tadalafil (CIALIS) 5 MG tablet Take 5 mg by mouth daily as needed for erectile dysfunction.    Historical Provider, MD    Family History No family history on file.  Social History Social  History  Substance Use Topics  . Smoking status: Never Smoker  . Smokeless tobacco: Not on file  . Alcohol use Yes     Allergies   Celecoxib and Shellfish-derived products   Review of Systems Review of Systems  Unable to perform ROS: Acuity of condition  Cardiovascular: Negative for chest pain.  Musculoskeletal: Negative for back pain.    LEVEL V CAVEAT: HPI and ROS limited due to acuity condition of patient  Physical Exam Updated Vital Signs There were no vitals taken for this visit.  Physical Exam    Constitutional: He appears well-developed and well-nourished.  HENT:  Head: Normocephalic and atraumatic.  Mouth/Throat: Oropharynx is clear and moist. No oropharyngeal exudate.  Moist mucous membranes. No exudates. Drooling actively  Eyes: Conjunctivae and EOM are normal. Pupils are equal, round, and reactive to light.  Neck: Normal range of motion. Neck supple. No JVD present. No tracheal deviation present.  No carotid bruits. Trachea midline.   Cardiovascular: Normal rate, regular rhythm, normal heart sounds and intact distal pulses.  Exam reveals no gallop and no friction rub.   No murmur heard. RRR.   Pulmonary/Chest: Effort normal and breath sounds normal. No stridor. No respiratory distress. He has no wheezes. He has no rales.  Lungs CTA bilaterally.   Abdominal: Soft. Bowel sounds are normal. He exhibits no distension and no mass. There is no tenderness. There is no rebound and no guarding.  Genitourinary:  Genitourinary Comments: Rectal tone intact, hemoccult negative, normal looking genitals   Musculoskeletal: Normal range of motion. He exhibits no edema.  Lymphadenopathy:    He has no cervical adenopathy.  Neurological: He is alert. He has normal reflexes. He displays normal reflexes. Coordination normal.  GCS:  2- speech 4- eyes 5/6-movement  babinski on the left Significant dysarthria  2+ DTRs intact  Skin: Skin is warm and dry. Capillary refill takes less than 2 seconds.  Psychiatric: He has a normal mood and affect.  Nursing note and vitals reviewed.    ED Treatments / Results   Vitals:   02/16/17 0126 02/16/17 0200  BP:  (!) 154/75  Pulse: 81 78  Resp: 19 (!) 22    COORDINATION OF CARE:  1:07 AM Discussed treatment plan with staff at bedside and staff agreed to plan.  Labs (all labs ordered are listed, but only abnormal results are displayed) Results for orders placed or performed during the hospital encounter of 02/16/17  Protime-INR  Result Value  Ref Range   Prothrombin Time 12.9 11.4 - 15.2 seconds   INR 0.97   APTT  Result Value Ref Range   aPTT 22 (L) 24 - 36 seconds  CBC  Result Value Ref Range   WBC 6.6 4.0 - 10.5 K/uL   RBC 4.20 (L) 4.22 - 5.81 MIL/uL   Hemoglobin 13.2 13.0 - 17.0 g/dL   HCT 16.1 (L) 09.6 - 04.5 %   MCV 91.0 78.0 - 100.0 fL   MCH 31.4 26.0 - 34.0 pg   MCHC 34.6 30.0 - 36.0 g/dL   RDW 40.9 81.1 - 91.4 %   Platelets 207 150 - 400 K/uL  Differential  Result Value Ref Range   Neutrophils Relative % 49 %   Neutro Abs 3.3 1.7 - 7.7 K/uL   Lymphocytes Relative 36 %   Lymphs Abs 2.4 0.7 - 4.0 K/uL   Monocytes Relative 13 %   Monocytes Absolute 0.8 0.1 - 1.0 K/uL   Eosinophils Relative 2 %   Eosinophils  Absolute 0.1 0.0 - 0.7 K/uL   Basophils Relative 0 %   Basophils Absolute 0.0 0.0 - 0.1 K/uL  Comprehensive metabolic panel  Result Value Ref Range   Sodium 136 135 - 145 mmol/L   Potassium 3.7 3.5 - 5.1 mmol/L   Chloride 102 101 - 111 mmol/L   CO2 23 22 - 32 mmol/L   Glucose, Bld 206 (H) 65 - 99 mg/dL   BUN 22 (H) 6 - 20 mg/dL   Creatinine, Ser 1.61 0.61 - 1.24 mg/dL   Calcium 9.3 8.9 - 09.6 mg/dL   Total Protein 6.9 6.5 - 8.1 g/dL   Albumin 4.4 3.5 - 5.0 g/dL   AST 30 15 - 41 U/L   ALT 29 17 - 63 U/L   Alkaline Phosphatase 33 (L) 38 - 126 U/L   Total Bilirubin 0.6 0.3 - 1.2 mg/dL   GFR calc non Af Amer >60 >60 mL/min   GFR calc Af Amer >60 >60 mL/min   Anion gap 11 5 - 15  I-stat troponin, ED  Result Value Ref Range   Troponin i, poc 0.00 0.00 - 0.08 ng/mL   Comment 3          CBG monitoring, ED  Result Value Ref Range   Glucose-Capillary 206 (H) 65 - 99 mg/dL  I-Stat Chem 8, ED  Result Value Ref Range   Sodium 138 135 - 145 mmol/L   Potassium 3.6 3.5 - 5.1 mmol/L   Chloride 101 101 - 111 mmol/L   BUN 24 (H) 6 - 20 mg/dL   Creatinine, Ser 0.45 0.61 - 1.24 mg/dL   Glucose, Bld 409 (H) 65 - 99 mg/dL   Calcium, Ion 8.11 9.14 - 1.40 mmol/L   TCO2 26 0 - 100 mmol/L   Hemoglobin 13.3  13.0 - 17.0 g/dL   HCT 78.2 95.6 - 21.3 %  CBG monitoring, ED  Result Value Ref Range   Glucose-Capillary 238 (H) 65 - 99 mg/dL   Ct Angio Head W Or Wo Contrast  Result Date: 02/16/2017 CLINICAL DATA:  Right-sided weakness and aphasia EXAM: CT ANGIOGRAPHY HEAD AND NECK TECHNIQUE: Multidetector CT imaging of the head and neck was performed using the standard protocol during bolus administration of intravenous contrast. Multiplanar CT image reconstructions and MIPs were obtained to evaluate the vascular anatomy. Carotid stenosis measurements (when applicable) are obtained utilizing NASCET criteria, using the distal internal carotid diameter as the denominator. CONTRAST:  50 mL Isovue 370 IV COMPARISON:  Head CT 02/16/2017 FINDINGS: CTA NECK FINDINGS Aortic arch: There is no aneurysm or dissection of the visualized ascending aorta or aortic arch. There is a normal 3 vessel branching pattern. The visualized proximal subclavian arteries are widely patent. There is aortic atherosclerotic calcification. Right carotid system: There is multifocal atherosclerotic calcification within the right common carotid artery. There is mixed calcified and noncalcified plaque within the proximal right internal carotid artery with at least 90% stenosis. The distal right ICA is normal. Left carotid system: There is calcification at the origin of the left common carotid artery. The left internal carotid artery is occluded at its origin and along the remainder of its cervical course to the skullbase. There is return of contrast enhancement at the distal petrous segment. Vertebral arteries: The vertebral system is codominant. Both vertebral artery origins are patent. There is multifocal irregular narrowing of the left vertebral artery without hemodynamically significant stenosis. Both V3 and V4 segments are normal. The vertebrobasilar confluence is normal. Skeleton: There is  no bony spinal canal stenosis. No lytic or blastic lesions.  Other neck: The nasopharynx is clear. The oropharynx and hypopharynx are normal. The epiglottis is normal. The supraglottic larynx, glottis and subglottic larynx are normal. No retropharyngeal collection. The parapharyngeal spaces are preserved. The parotid and submandibular glands are normal. No sialolithiasis or salivary ductal dilatation. The thyroid gland is normal. There is no cervical lymphadenopathy. Upper chest: No pneumothorax or pleural effusion. No nodules or masses. Review of the MIP images confirms the above findings CTA HEAD FINDINGS Anterior circulation: --Intracranial internal carotid arteries: The left ICA is occluded without opacification of the proximal petrous segment. The distal petrous segment and the remainder of the intracranial left ICA are opacified. There is mild atherosclerotic calcification of the cavernous and clinoid segments. The right side is normal. --Anterior cerebral arteries: Normal. --Middle cerebral arteries: There is complete occlusion of the left middle cerebral artery at its origin. No enhancement of the M1 or and 2 segments is seen. The distal small vessels in the left MCA distribution are relatively symmetric compared to the right. The right MCA is normal. --Posterior communicating arteries: Present bilaterally. Posterior circulation: --Posterior cerebral arteries: Normal. --Superior cerebellar arteries: Normal. --Basilar artery: Normal. --Anterior inferior cerebellar arteries: Not clearly visualized. --Posterior inferior cerebellar arteries: Normal. Venous sinuses: As permitted by contrast timing, patent. Anatomic variants: None Delayed phase: Not performed. Review of the MIP images confirms the above findings IMPRESSION: 1. 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 Distal collateral flow within the left MCA distribution persists and is relatively symmetric to the right side. 2. 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 3. At least 90% stenosis at the origin of the right internal carotid artery secondary to mixed calcified and noncalcified plaque. Critical Value/emergent results were called by telephone at the time of interpretation on 02/16/2017 at 1:25 am to Dr. Ritta Slot , who verbally acknowledged these results. Electronically Signed   By: Deatra Robinson M.D.   On: 02/16/2017 01:47   Ct Angio Neck W Or Wo Contrast  Result Date: 02/16/2017 CLINICAL DATA:  Right-sided weakness and aphasia EXAM: CT ANGIOGRAPHY HEAD AND NECK TECHNIQUE: Multidetector CT imaging of the head and neck was performed using the standard protocol during bolus administration of intravenous contrast. Multiplanar CT image reconstructions and MIPs were obtained to evaluate the vascular anatomy. Carotid stenosis measurements (when applicable) are obtained utilizing NASCET criteria, using the distal internal carotid diameter as the denominator. CONTRAST:  50 mL Isovue 370 IV COMPARISON:  Head CT 02/16/2017 FINDINGS: CTA NECK FINDINGS Aortic arch: There is no aneurysm or dissection of the visualized ascending aorta or aortic arch. There is a normal 3 vessel branching pattern. The visualized proximal subclavian arteries are widely patent. There is aortic atherosclerotic calcification. Right carotid system: There is multifocal atherosclerotic calcification within the right common carotid artery. There is mixed calcified and noncalcified plaque within the proximal right internal carotid artery with at least 90% stenosis. The distal right ICA is normal. Left carotid system: There is calcification at the origin of the left common carotid artery. The left internal carotid artery is occluded at its origin and along the remainder of its cervical course to the skullbase. There is return of contrast enhancement at the distal petrous segment.  Vertebral arteries: The vertebral system is codominant. Both vertebral artery origins are patent. There  is multifocal irregular narrowing of the left vertebral artery without hemodynamically significant stenosis. Both V3 and V4 segments are normal. The vertebrobasilar confluence is normal. Skeleton: There is no bony spinal canal stenosis. No lytic or blastic lesions. Other neck: The nasopharynx is clear. The oropharynx and hypopharynx are normal. The epiglottis is normal. The supraglottic larynx, glottis and subglottic larynx are normal. No retropharyngeal collection. The parapharyngeal spaces are preserved. The parotid and submandibular glands are normal. No sialolithiasis or salivary ductal dilatation. The thyroid gland is normal. There is no cervical lymphadenopathy. Upper chest: No pneumothorax or pleural effusion. No nodules or masses. Review of the MIP images confirms the above findings CTA HEAD FINDINGS Anterior circulation: --Intracranial internal carotid arteries: The left ICA is occluded without opacification of the proximal petrous segment. The distal petrous segment and the remainder of the intracranial left ICA are opacified. There is mild atherosclerotic calcification of the cavernous and clinoid segments. The right side is normal. --Anterior cerebral arteries: Normal. --Middle cerebral arteries: There is complete occlusion of the left middle cerebral artery at its origin. No enhancement of the M1 or and 2 segments is seen. The distal small vessels in the left MCA distribution are relatively symmetric compared to the right. The right MCA is normal. --Posterior communicating arteries: Present bilaterally. Posterior circulation: --Posterior cerebral arteries: Normal. --Superior cerebellar arteries: Normal. --Basilar artery: Normal. --Anterior inferior cerebellar arteries: Not clearly visualized. --Posterior inferior cerebellar arteries: Normal. Venous sinuses: As permitted by contrast timing, patent.  Anatomic variants: None Delayed phase: Not performed. Review of the MIP images confirms the above findings IMPRESSION: 1. 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 Distal collateral flow within the left MCA distribution persists and is relatively symmetric to the right side. 2. 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 3. At least 90% stenosis at the origin of the right internal carotid artery secondary to mixed calcified and noncalcified plaque. Critical Value/emergent results were called by telephone at the time of interpretation on 02/16/2017 at 1:25 am to Dr. Ritta Slot , who verbally acknowledged these results. Electronically Signed   By: Deatra Robinson M.D.   On: 02/16/2017 01:47   Ct Head Code Stroke W/o Cm  Result Date: 02/16/2017 CLINICAL DATA:  Code stroke.  Right-sided weakness and aphasia EXAM: CT HEAD WITHOUT CONTRAST TECHNIQUE: Contiguous axial images were obtained from the base of the skull through the vertex without intravenous contrast. COMPARISON:  None. FINDINGS: Brain: No mass lesion, intraparenchymal hemorrhage or extra-axial collection. No evidence of acute cortical infarct. Brain parenchyma and CSF-containing spaces are normal for age. Vascular: There is a hyperdense left MCA. Skull: Normal visualized skull base, calvarium and extracranial soft tissues. Sinuses/Orbits: No sinus fluid levels or advanced mucosal thickening. No mastoid effusion. Normal orbits. ASPECTS [facility] Stroke Program Early CT Score) - Ganglionic level infarction (caudate, lentiform nuclei, internal capsule, insula, M1-M3 cortex): 7 - Supraganglionic infarction (M4-M6 cortex): 3 Total score (0-10 with 10 being normal): 10 IMPRESSION: 1. Hyper dense left middle cerebral artery, likely indicating the presence of acute thrombus. 2. ASPECTS is  10. 3. No intracranial hemorrhage. These results were called by telephone at the time of interpretation on 02/16/2017 at 12:53 am to Dr. Ritta Slot, who verbally acknowledged these results. Electronically Signed   By: Deatra Robinson M.D.   On: 02/16/2017 00:55    Radiology Ct  Head Code Stroke W/o Cm  Result Date: 02/16/2017 CLINICAL DATA:  Code stroke.  Right-sided weakness and aphasia EXAM: CT HEAD WITHOUT CONTRAST TECHNIQUE: Contiguous axial images were obtained from the base of the skull through the vertex without intravenous contrast. COMPARISON:  None. FINDINGS: Brain: No mass lesion, intraparenchymal hemorrhage or extra-axial collection. No evidence of acute cortical infarct. Brain parenchyma and CSF-containing spaces are normal for age. Vascular: There is a hyperdense left MCA. Skull: Normal visualized skull base, calvarium and extracranial soft tissues. Sinuses/Orbits: No sinus fluid levels or advanced mucosal thickening. No mastoid effusion. Normal orbits. ASPECTS [facility] Stroke Program Early CT Score) - Ganglionic level infarction (caudate, lentiform nuclei, internal capsule, insula, M1-M3 cortex): 7 - Supraganglionic infarction (M4-M6 cortex): 3 Total score (0-10 with 10 being normal): 10 IMPRESSION: 1. Hyper dense left middle cerebral artery, likely indicating the presence of acute thrombus. 2. ASPECTS is 10. 3. No intracranial hemorrhage. These results were called by telephone at the time of interpretation on 02/16/2017 at 12:53 am to Dr. Ritta Slot, who verbally acknowledged these results. Electronically Signed   By: Deatra Robinson M.D.   On: 02/16/2017 00:55    Procedures Procedures (including critical care time)  Medications Ordered in ED Medications  iopamidol (ISOVUE-370) 76 % injection (50 mLs  Contrast Given 02/16/17 0100)    EKG Interpretation  Date/Time:  Friday Rainen [place] 13 2018 01:10:49 EDT Ventricular Rate:  83 PR Interval:    QRS Duration: 99 QT  Interval:  378 QTC Calculation: 445 R Axis:   64 Text Interpretation:  Sinus rhythm Nonspecific  st t change Confirmed by [facility]  MD, Joseluis Alessio ([id number]) on 02/16/2017 3:28:01 AM      MDM Reviewed: nursing note and vitals Interpretation: labs, ECG, x-ray and CT scan (elevated glucose, dense MCA sign by me on CT NACPD by me on cxr) Consults: neurology  CRITICAL CARE Performed by: Jasmine Awe Total critical care time: 31 minutes Critical care time was exclusive of separately billable procedures and treating other patients. Critical care was necessary to treat or prevent imminent or life-threatening deterioration. Critical care was time spent personally by me on the following activities: development of treatment plan with patient and/or surrogate as well as nursing, discussions with consultants, evaluation of patient's response to treatment, examination of patient, obtaining history from patient or surrogate, ordering and performing treatments and interventions, ordering and review of laboratory studies, ordering and review of radiographic studies, pulse oximetry and re-evaluation of patient's condition.  Final Clinical Impressions(s) / ED Diagnoses   Final diagnoses:  Stroke (cerebrum) (HCC)  Stroke (cerebrum) (HCC)   Had TPA by neurology and was admitted by neurology.    I personally performed the services described in this documentation, which was scribed in my presence. The recorded information has been reviewed and is accurate.    New Prescriptions New Prescriptions   No medications on file     Delvis Kau, MD 02/16/17 [phone]

## 2017-02-16 NOTE — Progress Notes (Signed)
CODE STROKE called a [id number]. Patient arrived with EMS at 0037 with EMS.  Patient was LSW around 1115, patient's wife found him on the floor in the bathroom and patient was unable to speak.    Upon arrival, patient did have facial droop, aphasic, severe right hemiparesis and did not respond to pain in the right side initially, right side facial weakness, and left gaze. Patient was alert, patient was oriented (self, place, birthday), but did express dysarthria.  Pupils were 3-26mm round brisk equal and reactive.   Patient was taken CT and CT Head and CTA Head and NECK were done. RPh was already present with TPA if it was needed.  CTA + for Left M1 occlusion,  TPA was primed and started a 111, dose of , with a bolus dose of .  Foley was placed while contrast was being mixed for CTA.   Before TPA was started, patient did some improvement, and was able to follow some commands. Patient was able to lift the right leg and no drift was present and patient was able to lift right arm 2/5 strength. Patient denied HA or pain.  TPA start time 0111, patient taken to IR at 0116, arrived in IR at 0118. Handoff given to IR staff by myself and ED RN.   VS remained stable, BP with the parameters for TPA.  Time Arrived: 0034 Time Ended: 0145

## 2017-02-16 NOTE — ED Notes (Signed)
Pt arrived to Ed via EMS. [facility] family reports pt LKW at 4/12 @ 23:30. Pt spoke to wife and went into the bathroom. Wife went in to check on pt and he was down on the floor on his knees with right side unable to move.

## 2017-02-16 NOTE — ED Notes (Addendum)
Pt arrived in IR at 01:16. IR notified at 00:59. First IR MD 01:26. arrived at IR RN arrived at 01:37. IR nurse given report. Care handoff completed.

## 2017-02-16 NOTE — Progress Notes (Signed)
PT Cancellation Note  Patient Details Name: Erik [name] MRN: [id number] DOB: July 16, 1956   Cancelled Treatment:    Reason Eval/Treat Not Completed: Patient not medically ready, on strict bed rest orders. Will follow-up with PT evaluation when medically appropriate as time allows.  Dewayne Hatch, SPT Office-[phone]  Ina Homes 02/16/2017, 6:59 AM

## 2017-02-16 NOTE — Procedures (Addendum)
ADDENDUM:  10:14am, 02/16/2017  Upon further review of intraoperative images, addendum created to address findings of the CONTRALATERAL carotid system (right).  The cervical angiogram demonstrates high grade stenosis of the right proximal ICA, just after the bifurcation.  Measurement will be provided on angiogram images with percentage dictated in the image report.   Signed,  Yvone Neu. Loreta Ave DO       Neuro-Interventional Radiology Post Cerebral Angiogram Procedure Note  History:   61 yo male with acute left hemisphere stroke, presenting NIHSS reported >20. Last known well, 11:30pm.  tPA candidate, and received IV tPA.  Baseline mRS of 0.   CTA shows left ICA occlusion with left carotid disease.  M1 occlusion.  ASPECTS of 10.    Baseline mRS:  0 NIHSS:   >20 Last Known Well:  11:30pm   ASPECTS:   10 Anesthesia    GETA Skin Puncture:   2:12am  First Pass Date & Time: 3:55am Solitaire deployed with TICI 2b.  Solitaire/clot removed 4:03am with TICI 3 flow  IA tPA:   Yes IA Medication:  [No] Post TICI Score:  TICI 3 Device:   Solitaire 4x40 Additional Meds:   plavix OG tube,  ASA OG tube, 3:00am.    Procedure:  -US guided left CFA access for arterial line 46F sheath -US guided right CFA access -Cervicocerebral angiogram -Balloon angioplasty of left ICA occlusive lesion at the carotid bulb (initial revascularization of ICA occlusion 3:03am.) -Combination local suction and retrievable stent for mechanical thrombectomy of acute left M1 ELVO.  Rolm Gala technique.) -Attempted left ICA stenting with selected Exact 8-6 x40 stent. Failed stent placement given the calcified plaque, despite angioplasty with 4mm & 5mm balloon.     Findings:  Right ICA disease, with less than 50% stenosis at the ICA origin. Right sided cross-flow fills the left ACA, without significant MCA collaterals.  Left ICA occlusion, with minimal collateral flow to the left MCA territory via the skull  base and the ECA branches.   - After treatment, flow restored through the ICA, with high grade stenosis persisting.  Left M1 occlusion.    - after single pass solitaire, restoration of TICI 3 flow.   Complications: None  EBL: ~150cc.   Recommendations:  Given the inability to pass the carotid stent through the calcified plaque without risking dissection or carotid injury, wound recommend medical therapy and referral for surgical evaluation.  Discussed with Dr. Petra Kuba.    CT head now.   ICU admission  Right CFA sheath (68F) to stay until am.  VIR techs to remove in am.   Maintain left CFA 46F sheath for arterial line.   Signed,  Yvone Neu. Loreta Ave, DO

## 2017-02-16 NOTE — ED Notes (Signed)
Wife arrived. Speaking with Amada Jupiter, MD

## 2017-02-16 NOTE — Progress Notes (Signed)
Inpatient Diabetes Program Recommendations  AACE/ADA: New Consensus Statement on Inpatient Glycemic Control (2015)  Target Ranges:  Prepandial:   less than 140 mg/dL      Peak postprandial:   less than 180 mg/dL (1-2 hours)      Critically ill patients:  140 - 180 mg/dL   Lab Results  Component Value Date   GLUCAP 238 (H) 02/16/2017    Review of Glycemic Control  Diabetes history: No prior hx  Inpatient Diabetes Program Recommendations:  Noted pending A1c. Please consider ICU Glycemic Control Order set while patient is on vent.  Thank you, Billy Fischer. Latreece Mochizuki, RN, MSN, CDE  Diabetes Coordinator Inpatient Glycemic Control Team Team Pager [phone] (8am-5pm) 02/16/2017 8:35 AM

## 2017-02-16 NOTE — Progress Notes (Signed)
SLP Cancellation Note  Patient Details Name: Erik [name] MRN: [id number] DOB: Jun 11, 1956   Cancelled treatment:       Reason Eval/Treat Not Completed: Patient not medically ready   Lance [place], Riley Nearing 02/16/2017, 11:48 AM

## 2017-02-16 NOTE — Progress Notes (Signed)
STROKE TEAM PROGRESS NOTE   SUBJECTIVE (INTERVAL HISTORY) Erik [name] is at the bedside.  Still intubated on low dose sedation but able to follow commands and open eyes. Still has sheath and aline, will d/c when able. MRI pending.    OBJECTIVE Temp:  [97.3 F (36.3 C)] 97.3 F (36.3 C) (04/13 0809) Pulse Rate:  [50-83] 59 (04/13 1000) Cardiac Rhythm: Sinus bradycardia (04/13 0800) Resp:  [10-22] 16 (04/13 1000) BP: (85-157)/(56-77) 122/69 (04/13 1000) SpO2:  [94 %-100 %] 100 % (04/13 1000) Arterial Line BP: (108-174)/(46-70) 170/66 (04/13 1000) FiO2 (%):  [50 %-70 %] 50 % (04/13 0809) Weight:  [220 lb 0.3 oz (99.8 kg)] 220 lb 0.3 oz (99.8 kg) (04/13 0138)   Recent Labs Lab 02/16/17 0039 02/16/17 0056  GLUCAP 206* 238*    Recent Labs Lab 02/16/17 0038 02/16/17 0050  NA 136 138  K 3.7 3.6  CL 102 101  CO2 23  --   GLUCOSE 206* 207*  BUN 22* 24*  CREATININE 0.97 1.00  CALCIUM 9.3  --     Recent Labs Lab 02/16/17 0038  AST 30  ALT 29  ALKPHOS 33*  BILITOT 0.6  PROT 6.9  ALBUMIN 4.4    Recent Labs Lab 02/16/17 0038 02/16/17 0050  WBC 6.6  --   NEUTROABS 3.3  --   HGB 13.2 13.3  HCT 38.2* 39.0  MCV 91.0  --   PLT 207  --    No results for input(s): CKTOTAL, CKMB, CKMBINDEX, TROPONINI in the last 168 hours.  Recent Labs  02/16/17 0038  LABPROT 12.9  INR 0.97   No results for input(s): COLORURINE, LABSPEC, PHURINE, GLUCOSEU, HGBUR, BILIRUBINUR, KETONESUR, PROTEINUR, UROBILINOGEN, NITRITE, LEUKOCYTESUR in the last 72 hours.  Invalid input(s): APPERANCEUR     Component Value Date/Time   CHOL 135 02/16/2017 0612   TRIG 144 02/16/2017 0612   HDL 28 (L) 02/16/2017 0612   CHOLHDL 4.8 02/16/2017 0612   VLDL 29 02/16/2017 0612   LDLCALC 78 02/16/2017 0612   No results found for: HGBA1C No results found for: LABOPIA, COCAINSCRNUR, LABBENZ, AMPHETMU, THCU, LABBARB  No results for input(s): ETH in the last 168 hours.  I have personally reviewed the  radiological images below and agree with the radiology interpretations.  Ct Angio Head W Or Wo Contrast  Result Date: 02/16/2017 CLINICAL DATA:  Right-sided weakness and aphasia EXAM: CT ANGIOGRAPHY HEAD AND NECK TECHNIQUE: Multidetector CT imaging of the head and neck was performed using the standard protocol during bolus administration of intravenous contrast. Multiplanar CT image reconstructions and MIPs were obtained to evaluate the vascular anatomy. Carotid stenosis measurements (when applicable) are obtained utilizing NASCET criteria, using the distal internal carotid diameter as the denominator. CONTRAST:  50 mL Isovue 370 IV COMPARISON:  Head CT 02/16/2017 FINDINGS: CTA NECK FINDINGS Aortic arch: There is no aneurysm or dissection of the visualized ascending aorta or aortic arch. There is a normal 3 vessel branching pattern. The visualized proximal subclavian arteries are widely patent. There is aortic atherosclerotic calcification. Right carotid system: There is multifocal atherosclerotic calcification within the right common carotid artery. There is mixed calcified and noncalcified plaque within the proximal right internal carotid artery with at least 90% stenosis. The distal right ICA is normal. Left carotid system: There is calcification at the origin of the left common carotid artery. The left internal carotid artery is occluded at its origin and along the remainder of its cervical course to the skullbase. There is return  of contrast enhancement at the distal petrous segment. Vertebral arteries: The vertebral system is codominant. Both vertebral artery origins are patent. There is multifocal irregular narrowing of the left vertebral artery without hemodynamically significant stenosis. Both V3 and V4 segments are normal. The vertebrobasilar confluence is normal. Skeleton: There is no bony spinal canal stenosis. No lytic or blastic lesions. Other neck: The nasopharynx is clear. The oropharynx and  hypopharynx are normal. The epiglottis is normal. The supraglottic larynx, glottis and subglottic larynx are normal. No retropharyngeal collection. The parapharyngeal spaces are preserved. The parotid and submandibular glands are normal. No sialolithiasis or salivary ductal dilatation. The thyroid gland is normal. There is no cervical lymphadenopathy. Upper chest: No pneumothorax or pleural effusion. No nodules or masses. Review of the MIP images confirms the above findings CTA HEAD FINDINGS Anterior circulation: --Intracranial internal carotid arteries: The left ICA is occluded without opacification of the proximal petrous segment. The distal petrous segment and the remainder of the intracranial left ICA are opacified. There is mild atherosclerotic calcification of the cavernous and clinoid segments. The right side is normal. --Anterior cerebral arteries: Normal. --Middle cerebral arteries: There is complete occlusion of the left middle cerebral artery at its origin. No enhancement of the M1 or and 2 segments is seen. The distal small vessels in the left MCA distribution are relatively symmetric compared to the right. The right MCA is normal. --Posterior communicating arteries: Present bilaterally. Posterior circulation: --Posterior cerebral arteries: Normal. --Superior cerebellar arteries: Normal. --Basilar artery: Normal. --Anterior inferior cerebellar arteries: Not clearly visualized. --Posterior inferior cerebellar arteries: Normal. Venous sinuses: As permitted by contrast timing, patent. Anatomic variants: None Delayed phase: Not performed. Review of the MIP images confirms the above findings IMPRESSION: 1. 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 Distal collateral flow within the left MCA distribution persists and is relatively symmetric to the right side. 2. 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 3. At least 90% stenosis at the origin of the right internal carotid artery secondary to mixed calcified and noncalcified plaque. Critical Value/emergent results were called by telephone at the time of interpretation on 02/16/2017 at 1:25 am to Dr. Ritta Slot , who verbally acknowledged these results. Electronically Signed   By: Deatra Robinson M.D.   On: 02/16/2017 01:47   Ct Head Wo Contrast  Result Date: 02/16/2017 CLINICAL DATA:  Left MCA stroke, status post intervention. EXAM: CT HEAD WITHOUT CONTRAST TECHNIQUE: Contiguous axial images were obtained from the base of the skull through the vertex without intravenous contrast. COMPARISON:  CTA head neck 02/16/2017 FINDINGS: Brain: Gray-white differentiation in the left MCA territory is maintained. The basal ganglia and insular ribbons are preserved. There is no evidence of acute hemorrhage. There is residual contrast material opacifying the venous sinuses. Vascular: The previously seen hyperdense thrombus within the left middle cerebral artery is no longer present. Skull: Normal Sinuses/Orbits: Moderate bilateral maxillary mucosal thickening. Normal orbits. Mastoids are clear. Other: None. IMPRESSION: 1. Status post stroke intervention with resolution of previously seen hyperdense thrombus in the left middle cerebral artery. 2. No acute hemorrhage or cytotoxic edema. Electronically Signed   By: Deatra Robinson M.D.   On: 02/16/2017 06:43   Ct Angio Neck W Or Wo Contrast  Result Date: 02/16/2017 CLINICAL DATA:  Right-sided weakness and aphasia EXAM: CT ANGIOGRAPHY HEAD AND NECK TECHNIQUE: Multidetector CT  imaging of the head and neck was performed using the standard protocol during bolus administration of intravenous contrast. Multiplanar CT image reconstructions and MIPs were obtained to evaluate the vascular anatomy. Carotid stenosis measurements (when applicable) are obtained utilizing  NASCET criteria, using the distal internal carotid diameter as the denominator. CONTRAST:  50 mL Isovue 370 IV COMPARISON:  Head CT 02/16/2017 FINDINGS: CTA NECK FINDINGS Aortic arch: There is no aneurysm or dissection of the visualized ascending aorta or aortic arch. There is a normal 3 vessel branching pattern. The visualized proximal subclavian arteries are widely patent. There is aortic atherosclerotic calcification. Right carotid system: There is multifocal atherosclerotic calcification within the right common carotid artery. There is mixed calcified and noncalcified plaque within the proximal right internal carotid artery with at least 90% stenosis. The distal right ICA is normal. Left carotid system: There is calcification at the origin of the left common carotid artery. The left internal carotid artery is occluded at its origin and along the remainder of its cervical course to the skullbase. There is return of contrast enhancement at the distal petrous segment. Vertebral arteries: The vertebral system is codominant. Both vertebral artery origins are patent. There is multifocal irregular narrowing of the left vertebral artery without hemodynamically significant stenosis. Both V3 and V4 segments are normal. The vertebrobasilar confluence is normal. Skeleton: There is no bony spinal canal stenosis. No lytic or blastic lesions. Other neck: The nasopharynx is clear. The oropharynx and hypopharynx are normal. The epiglottis is normal. The supraglottic larynx, glottis and subglottic larynx are normal. No retropharyngeal collection. The parapharyngeal spaces are preserved. The parotid and submandibular glands are normal. No sialolithiasis or salivary ductal dilatation. The thyroid gland is normal. There is no cervical lymphadenopathy. Upper chest: No pneumothorax or pleural effusion. No nodules or masses. Review of the MIP images confirms the above findings CTA HEAD FINDINGS Anterior circulation: --Intracranial  internal carotid arteries: The left ICA is occluded without opacification of the proximal petrous segment. The distal petrous segment and the remainder of the intracranial left ICA are opacified. There is mild atherosclerotic calcification of the cavernous and clinoid segments. The right side is normal. --Anterior cerebral arteries: Normal. --Middle cerebral arteries: There is complete occlusion of the left middle cerebral artery at its origin. No enhancement of the M1 or and 2 segments is seen. The distal small vessels in the left MCA distribution are relatively symmetric compared to the right. The right MCA is normal. --Posterior communicating arteries: Present bilaterally. Posterior circulation: --Posterior cerebral arteries: Normal. --Superior cerebellar arteries: Normal. --Basilar artery: Normal. --Anterior inferior cerebellar arteries: Not clearly visualized. --Posterior inferior cerebellar arteries: Normal. Venous sinuses: As permitted by contrast timing, patent. Anatomic variants: None Delayed phase: Not performed. Review of the MIP images confirms the above findings IMPRESSION: 1. 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 Distal collateral flow within the left MCA distribution persists and is relatively symmetric to the right side. 2. 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 3. At least 90% stenosis at the origin of the right internal carotid artery secondary to mixed calcified and noncalcified plaque. Critical Value/emergent results were called by telephone at the time of interpretation on 02/16/2017 at 1:25 am to Dr. Ritta Slot , who verbally acknowledged these results. Electronically Signed   By: Deatra Robinson M.D.   On: 02/16/2017 01:47  Dg Chest Port 1 View  Result Date: 02/16/2017 CLINICAL DATA:  Respiratory failure,  intubated EXAM: PORTABLE CHEST 1 VIEW COMPARISON:  None available FINDINGS: Endotracheal tube 4.3 cm above the carina. NG tube within the stomach, tip not visualized. Marked cardiomegaly with central vascular congestion and low lung volumes. Mild streaky edema pattern and basilar atelectasis throughout both lungs. No large effusion or pneumothorax. Trachea is midline. No acute osseous finding. Remote postop changes of the right shoulder. IMPRESSION: Cardiomegaly with mild central vascular congestion and low lung volumes Scattered mild bilateral streaky edema pattern and atelectasis. Support apparatus in good position as above. Electronically Signed   By: Judie Petit.  Shick M.D.   On: 02/16/2017 08:54   Ct Head Code Stroke W/o Cm  Result Date: 02/16/2017 CLINICAL DATA:  Code stroke.  Right-sided weakness and aphasia EXAM: CT HEAD WITHOUT CONTRAST TECHNIQUE: Contiguous axial images were obtained from the base of the skull through the vertex without intravenous contrast. COMPARISON:  None. FINDINGS: Brain: No mass lesion, intraparenchymal hemorrhage or extra-axial collection. No evidence of acute cortical infarct. Brain parenchyma and CSF-containing spaces are normal for age. Vascular: There is a hyperdense left MCA. Skull: Normal visualized skull base, calvarium and extracranial soft tissues. Sinuses/Orbits: No sinus fluid levels or advanced mucosal thickening. No mastoid effusion. Normal orbits. ASPECTS [facility] Stroke Program Early CT Score) - Ganglionic level infarction (caudate, lentiform nuclei, internal capsule, insula, M1-M3 cortex): 7 - Supraganglionic infarction (M4-M6 cortex): 3 Total score (0-10 with 10 being normal): 10 IMPRESSION: 1. Hyper dense left middle cerebral artery, likely indicating the presence of acute thrombus. 2. ASPECTS is 10. 3. No intracranial hemorrhage. These results were called by telephone at the time of interpretation on 02/16/2017 at 12:53 am to Dr. Ritta Slot, who verbally  acknowledged these results. Electronically Signed   By: Deatra Robinson M.D.   On: 02/16/2017 00:55   2D Echocardiogram  penidng  MRI and MRA pending   PHYSICAL EXAM  Temp:  [97.3 F (36.3 C)] 97.3 F (36.3 C) (04/13 0809) Pulse Rate:  [50-83] 59 (04/13 1000) Resp:  [10-22] 16 (04/13 1000) BP: (85-157)/(56-77) 122/69 (04/13 1000) SpO2:  [94 %-100 %] 100 % (04/13 1000) Arterial Line BP: (108-174)/(46-70) 170/66 (04/13 1000) FiO2 (%):  [50 %-70 %] 50 % (04/13 0809) Weight:  [220 lb 0.3 oz (99.8 kg)] 220 lb 0.3 oz (99.8 kg) (04/13 0138)  General - Well nourished, well developed, intubated and on low dose sedation.  Ophthalmologic - fundi not visualized  Cardiovascular - Regular rate and rhythm.  Neuro - intubated on low dose sedation, able to open eyes on voice, following central and peripheral commands. PERRL, EOMI, blinking to visual threat on the left more consistent, but inconsistent on the right. Facial symmetry difficult to test due to ET. LUE and LLE purposeful movement and against gravity, RUE proximal 2/5, bicep 3/5, tricep 2/5, finger movement 0/5, RLE 2/5 proximal and 0/5 distally. DTR 1+ and no babinski bilaterally. Sensation, coordination not cooperative. And gait not tested.   ASSESSMENT/PLAN Erik [name] is a 61 y.o. male with history of HTN, HLD admitted for aphasia and right side weakness. Received tPA and endovascular intervention.    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 Failed attempt left ICA stenting. Embolic likely secondary to large vessel disease source given b/l ICA high grade stenosis with severely calcified plaques  MRI / MRA pending  CTA head and  neck showed left ICA and left M1 occlusion, right ICA proximal at least 90% stenosis  2D Echo  pending  LDL 78  HgbA1c penidng  SCDs for VTE prophylaxis within 24h tPA window  Diet NPO time specified   No  antithrombotic prior to admission, now within tPA window  Ongoing aggressive stroke risk factor management  Therapy recommendations:  pending  Disposition:  Pending  b/l ICA stenosis  Left ICA occlusion on CTA but now likely high grade stenosis post intervention  Failed left ICA stenting  Will need VVS consult for b/l ICA stenosis once extubated and stabilized  BP goal 120-160 post procedure  Diabetes  HgbA1c pending goal < 7.0  Uncontrolled  hyperglycemia  CBG monitoring  SSI  Hypertension  Home meds:   Amlodipine and cozaar BP goal 120-160 post tPA and revascularization procedure as well as b/l ICA stenosis  Stable at low 120s  Will give albumin 4 doses.  Hyperlipidemia  Home meds:  crestor 40  LDL 78, goal < 70  Resume crestor   Continue statin at discharge  Other Stroke Risk Factors  Advanced age  Obesity, Body mass index is 31.57 kg/m.   Other Active Problems  Elevated BUN  Hospital day # 0  This patient is critically ill due to Ieft ICA and MCA occlusion s/p tPA and IR, left and right ICA high grade stenosis, failed stenting, hyperglycemia and at significant risk of neurological worsening, death form recurrent stroke, cerebral edema, brain herniation, DKA. This patient's care requires constant monitoring of vital signs, hemodynamics, respiratory and cardiac monitoring, review of multiple databases, neurological assessment, discussion with family, other specialists and medical decision making of high complexity. I spent 45 minutes of neurocritical care time in the care of this patient.   Marvel Plan, MD PhD Stroke Neurology 02/16/2017 10:32 AM    To contact Stroke Continuity provider, please refer to [email or web address]. After hours, contact General Neurology

## 2017-02-16 NOTE — Anesthesia Procedure Notes (Addendum)
Procedure Name: Intubation Date/Time: 02/16/2017 1:58 AM Performed by: Claris Che Pre-anesthesia Checklist: Patient identified, Emergency Drugs available, Suction available, Patient being monitored and Timeout performed Patient Re-evaluated:Patient Re-evaluated prior to inductionOxygen Delivery Method: Circle system utilized Preoxygenation: Pre-oxygenation with 100% oxygen Intubation Type: IV induction, Rapid sequence and Cricoid Pressure applied Laryngoscope Size: Mac and 4 Grade View: Grade III Tube type: Subglottic suction tube Tube size: 7.5 mm Number of attempts: 1 Airway Equipment and Method: Stylet Placement Confirmation: ETT inserted through vocal cords under direct vision,  positive ETCO2 and breath sounds checked- equal and bilateral Secured at: 22 cm Tube secured with: Tape Dental Injury: Teeth and Oropharynx as per pre-operative assessment

## 2017-02-16 NOTE — Progress Notes (Signed)
Left femoral 4 french sheath discontinued by E Meital Riehl, RN,  vpad placed, 4x4 gauze dressing with tegederm applied, pedal pulse intact. Right 9 french femoral sheath discontinued by Pearlean Brownie, RT, Exoseal 7 french placed.  Pedal pulses intact, gauze and 4x4 tegederm dressing applied.  Pulses and sites reviewed with [facility].

## 2017-02-16 NOTE — Progress Notes (Signed)
Initial Nutrition Assessment  DOCUMENTATION CODES:   Obesity unspecified  INTERVENTION:    If TF started, rec initiation of Vital High Protein at goal rate of 20 ml/h (480 ml per day) and Prostat 30 ml QID   TF regimen + current Propofol to provide 1597 kcals, 162 gm protein, 401 ml free water daily  NUTRITION DIAGNOSIS:   Inadequate oral intake related to inability to eat as evidenced by NPO status  GOAL:   Provide needs based on ASPEN/SCCM guidelines  MONITOR:   Vent status, Labs, Weight trends, I & O's  REASON FOR ASSESSMENT:   Ventilator  ASSESSMENT:   61 yo Male with PMH of HTN and HLD who presented to [facility] on 4/12 with dysarthria and weakness Code stroke was called; patient received TPA at 11:15pm and CTA showed a left M1 occlusion. Per neuro notes, it is a possible embolus from left carotid stenosis.   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 Neurology note reviewed.  MRI pending.  Able to follow commands and open eyes. S/p IV TPA and percutaneous thrombectomy.  Labs and medications reviewed. CBG's 206-238.  Diet Order:  Diet NPO time specified  Skin:  Reviewed, no issues  Last BM:  N/A  Height:   Ht Readings from Last 1 Encounters:  02/16/17  (1.778 m)   Weight:   Wt Readings from Last 1 Encounters:  02/16/17 220 lb 0.3 oz (99.8 kg)   Ideal Body Weight:  75.4 kg  BMI:  Body mass index is 31.57 kg/m.  Estimated Nutritional Needs:   Kcal:  0981-1914  Protein:  150-160 gm  Fluid:  per MD  EDUCATION NEEDS:   No education needs identified at this time  Maureen Chatters, RD, LDN Pager #: [phone] After-Hours Pager #: [phone]

## 2017-02-16 NOTE — Transfer of Care (Signed)
Immediate Anesthesia Transfer of Care Note  Patient: Erik [name]  Procedure(s) Performed: Procedure(s): RADIOLOGY WITH ANESTHESIA (N/A)  Patient Location: ICU  Anesthesia Type:General  Level of Consciousness: sedated and Patient remains intubated per anesthesia plan  Airway & Oxygen Therapy: Patient remains intubated per anesthesia plan and Patient placed on Ventilator (see vital sign flow sheet for setting)  Post-op Assessment: Report given to RN and Post -op Vital signs reviewed and stable  Post vital signs: Reviewed and stable  Last Vitals:  Vitals:   02/16/17 0604 02/16/17 0606  BP:    Pulse:  (!) 57  Resp: 12 19    Last Pain: There were no vitals filed for this visit.       Complications: No apparent anesthesia complications

## 2017-02-16 NOTE — Consult Note (Signed)
Name: Erik [name] MRN: [id number] DOB: 04-07-1956    LOS: 0  PCCM Consult NOTE  History of Present Illness: Erik [name] is a 61yo male with PMH of HTN and HLD who presented to [facility] on 4/12 with dysarthria and weakness Code stroke was called; patient received TPA at 11:15pm and CTA showed a left M1 occlusion. Per neuro notes, it is a possible embolus from left carotid stenosis. IR was consulted and performed a percutaneous arterial thrombectomy of left A1; balloon angioplasty of left ICA and failed stenting of left ICA.  PCCM was consulted for vent management post procedure.  Lines / Drains: A line 4/13 Foley 4/13  Cultures: none  Antibiotics: none  Tests / Events: 4/12 - presented to [facility] with dysarthria and right sided weakness - L MCA stroke; s/p IV TPA and percutaneous thrombectomy. 4/12 - remained on vent post procedure  The patient is sedated, intubated and unable to provide history, which was obtained for available medical records.    Past Medical History:  Diagnosis Date  . Anxiety   . BPH (benign prostatic hyperplasia)   . ED (erectile dysfunction)   . H/O calcium pyrophosphate deposition disease (CPPD)   . HLD (hyperlipidemia)   . Hypogonadism in male   . Lower urinary tract symptoms (LUTS)   . Nocturia   . Testosterone overdose   . Vitamin D deficiency    Past Surgical History:  Procedure Laterality Date  . KNEE ARTHROSCOPY Bilateral    Prior to Admission medications   Medication Sig Start Date End Date Taking? Authorizing Provider  amLODipine (NORVASC) 10 MG tablet Take 10 mg by mouth daily. 03/24/15   Historical Provider, MD  CRESTOR 40 MG tablet TAKE 1 TABLET (40 MG TOTAL) BY MOUTH NIGHTLY. 03/24/15   Historical Provider, MD  FLUoxetine (PROZAC) 10 MG capsule Take 10 mg by mouth daily.    Historical Provider, MD  hydrochlorothiazide (HYDRODIURIL) 25 MG tablet TAKE 1 TABLET (25 MG TOTAL) BY MOUTH ONCE DAILY. 03/24/15   Historical Provider, MD  losartan  (COZAAR) 25 MG tablet TAKE 1 TABLET (25 MG TOTAL) BY MOUTH ONCE DAILY. 03/27/15   Historical Provider, MD  Multiple Vitamin (MULTIVITAMIN) tablet Take 1 tablet by mouth daily.    Historical Provider, MD  nabumetone (RELAFEN) 750 MG tablet Take 750 mg by mouth daily.    Historical Provider, MD  omeprazole (PRILOSEC) 40 MG capsule TAKE 1 CAPSULE (40 MG TOTAL) BY MOUTH ONCE DAILY. 03/24/15   Historical Provider, MD  phentermine 15 MG capsule TAKE 1 CAPSULE BY MOUTH EVERY MORNING BEFORE BREAKFAST 04/10/15   Historical Provider, MD  tadalafil (CIALIS) 5 MG tablet Take 5 mg by mouth daily as needed for erectile dysfunction.    Historical Provider, MD   Allergies Allergies  Allergen Reactions  . Celecoxib Nausea And Vomiting  . Shellfish-Derived Products Hives    Family History No family history on file.  Social History  reports that he has never smoked. He does not have any smokeless tobacco history on file. He reports that he drinks alcohol. His drug history is not on file.  Review Of Systems  11 points review of systems is negative with an exception of listed in HPI.  Vital Signs: Pulse Rate:  [57-83] 57 (04/13 0606) Resp:  [12-22] 19 (04/13 0606) BP: (154-157)/(72-75) 154/75 (04/13 0200) SpO2:  [94 %-100 %] 100 % (04/13 0606) FiO2 (%):  [70 %] 70 % (04/13 0606) Weight:  [99.8 kg (220 lb 0.3 oz)] 99.8 kg (  220 lb 0.3 oz) (04/13 0138) No intake/output data recorded.  Physical Examination: General:  Ill appearing male Neuro:  Sedated; PERRL; does not respond to painful stimuli    HEENT:  ETT in place Neck:  No JVD   Cardiovascular:  Distant heart sounds, N S1/S2, no murmurs, rubs or gallops appreciated; no LE edema; pulses intact Lungs:  Clear breath sounds on right; diminished breath sounds on left; no rales or rhonchi Abdomen:  Soft, NDNT Musculoskeletal:  Muscle tone intact; unable to evaluate fully due to sedation Skin:  intact  Ventilator settings: Vent Mode: PRVC FiO2 (%):  [70  %] 70 % Set Rate:  [16 bmp] 16 bmp Vt Set:  [580 mL] 580 mL PEEP:  [5 cmH20] 5 cmH20  Labs and Imaging:  Reviewed.  Please refer to the Assessment and Plan section for relevant results.  Assessment and Plan:  Ventilatory support in setting of CVA s/p TPA and thrombectomy 4/13 No known pulmonary pathology prior to hospitalization Current vent settings: PRVC FiO2 70, PEEP 5, RR 16, TV 380 Concern for tube displacement into right bronchus vs mucous plugging due to diminished breath sounds on left hemithorax Plan: --CXR for tube positioning - adjust as necessary --ABG after confirmation of tube positioning - adjust vent settings as indicated; goal O2 sat >90% --titrate sedation for RASS goal of 0, -1  Best practices / Disposition: -->ICU status under Neurology -->full code -->Protonix for GI Px -->ventilator bundle -->diet - NPO -->no family at bedside for update  The patient is critically ill with multiple organ systems failure and requires high complexity decision making for assessment and support, frequent evaluation and titration of therapies, application of advanced monitoring technologies and extensive interpretation of multiple databases. Critical Care Time devoted to patient care services described in this note is 33 minutes.  Gorica Svalina 02/16/2017, 6:08 AM    ATTENDING NOTE / ATTESTATION NOTE :   I have discussed the case with the resident/APP  Dr. Samuella Cota   I agree with the resident/APP's  history, physical examination, assessment, and plans.    I have edited the above note and modified it according to our agreed history, physical examination, assessment and plan.   Briefly,  Erik [name] is a 61yo male with PMH of HTN and HLD who presented to [facility] on 4/12 with dysarthria and weakness Code stroke was called; patient received TPA at 11:15pm and CTA showed a left M1 occlusion. Per neuro notes, it is a possible embolus from left carotid stenosis. IR was consulted and  performed a percutaneous arterial thrombectomy of left A1; balloon angioplasty of left ICA and failed stenting of left ICA.  PCCM was consulted for vent management post procedure.  Vitals:  Vitals:   02/16/17 0900 02/16/17 0930 02/16/17 0945 02/16/17 1000  BP: 121/69 117/66 124/68 122/69  Pulse: (!) 56 (!) 59 (!) 59 (!) 59  Resp: 16 16 16 16   Temp:      TempSrc:      SpO2: 100% 100% 100% 100%  Weight:      Height:        Constitutional/General: well-nourished, well-developed, intubated, sedated, not in any distress  Body mass index is 31.57 kg/m. Wt Readings from Last 3 Encounters:  02/16/17 99.8 kg (220 lb 0.3 oz)  04/30/15 90.8 kg (200 lb 3.2 oz)    HEENT: PERLA, anicteric sclerae. (-) Oral thrush. Intubated, ETT in place  Neck: No masses. Midline trachea. No JVD, (-) LAD. (-) bruits appreciated.  Respiratory/Chest:  Grossly normal chest. (-) deformity. (-) Accessory muscle use.  Symmetric expansion. Diminished BS on both lower lung zones. (-) wheezing, crackles, rhonchi (-) egophony  Cardiovascular: Regular rate and  rhythm, heart sounds normal, no murmur or gallops,  (-)  peripheral edema  Gastrointestinal:  Normal bowel sounds. Soft, non-tender. No hepatosplenomegaly.  (-) masses.   Musculoskeletal:  Normal muscle tone.   Extremities: Grossly normal. (-) clubbing, cyanosis.  (-) edema  Skin: (-) rash,lesions seen.   Neurological/Psychiatric : sedated, intubated. CN grossly intact. Unable to assess fully 2/2 sedation.     CBC Recent Labs     02/16/17  0038  02/16/17  0050  WBC  6.6   --   HGB  13.2  13.3  HCT  38.2*  39.0  PLT  207   --     Coag's Recent Labs     02/16/17  0038  APTT  22*  INR  0.97    BMET Recent Labs     02/16/17  0038  02/16/17  0050  NA  136  138  K  3.7  3.6  CL  102  101  CO2  23   --   BUN  22*  24*  CREATININE  0.97  1.00  GLUCOSE  206*  207*    Electrolytes Recent Labs     02/16/17  0038  CALCIUM   9.3    Sepsis Markers No results for input(s): PROCALCITON, O2SATVEN in the last 72 hours.  Invalid input(s): LACTICACIDVEN  ABG Recent Labs     02/16/17  0807  PHART  7.346*  PCO2ART  47.2  PO2ART  161.0*    Liver Enzymes Recent Labs     02/16/17  0038  AST  30  ALT  29  ALKPHOS  33*  BILITOT  0.6  ALBUMIN  4.4    Cardiac Enzymes No results for input(s): TROPONINI, PROBNP in the last 72 hours.  Glucose Recent Labs     02/16/17  0039  02/16/17  0056  GLUCAP  206*  238*    Imaging Ct Angio Head W Or Wo Contrast  Result Date: 02/16/2017 CLINICAL DATA:  Right-sided weakness and aphasia EXAM: CT ANGIOGRAPHY HEAD AND NECK TECHNIQUE: Multidetector CT imaging of the head and neck was performed using the standard protocol during bolus administration of intravenous contrast. Multiplanar CT image reconstructions and MIPs were obtained to evaluate the vascular anatomy. Carotid stenosis measurements (when applicable) are obtained utilizing NASCET criteria, using the distal internal carotid diameter as the denominator. CONTRAST:  50 mL Isovue 370 IV COMPARISON:  Head CT 02/16/2017 FINDINGS: CTA NECK FINDINGS Aortic arch: There is no aneurysm or dissection of the visualized ascending aorta or aortic arch. There is a normal 3 vessel branching pattern. The visualized proximal subclavian arteries are widely patent. There is aortic atherosclerotic calcification. Right carotid system: There is multifocal atherosclerotic calcification within the right common carotid artery. There is mixed calcified and noncalcified plaque within the proximal right internal carotid artery with at least 90% stenosis. The distal right ICA is normal. Left carotid system: There is calcification at the origin of the left common carotid artery. The left internal carotid artery is occluded at its origin and along the remainder of its cervical course to the skullbase. There is return of contrast enhancement at the  distal petrous segment. Vertebral arteries: The vertebral system is codominant. Both vertebral artery origins are patent. There is multifocal irregular narrowing of the left vertebral artery without  hemodynamically significant stenosis. Both V3 and V4 segments are normal. The vertebrobasilar confluence is normal. Skeleton: There is no bony spinal canal stenosis. No lytic or blastic lesions. Other neck: The nasopharynx is clear. The oropharynx and hypopharynx are normal. The epiglottis is normal. The supraglottic larynx, glottis and subglottic larynx are normal. No retropharyngeal collection. The parapharyngeal spaces are preserved. The parotid and submandibular glands are normal. No sialolithiasis or salivary ductal dilatation. The thyroid gland is normal. There is no cervical lymphadenopathy. Upper chest: No pneumothorax or pleural effusion. No nodules or masses. Review of the MIP images confirms the above findings CTA HEAD FINDINGS Anterior circulation: --Intracranial internal carotid arteries: The left ICA is occluded without opacification of the proximal petrous segment. The distal petrous segment and the remainder of the intracranial left ICA are opacified. There is mild atherosclerotic calcification of the cavernous and clinoid segments. The right side is normal. --Anterior cerebral arteries: Normal. --Middle cerebral arteries: There is complete occlusion of the left middle cerebral artery at its origin. No enhancement of the M1 or and 2 segments is seen. The distal small vessels in the left MCA distribution are relatively symmetric compared to the right. The right MCA is normal. --Posterior communicating arteries: Present bilaterally. Posterior circulation: --Posterior cerebral arteries: Normal. --Superior cerebellar arteries: Normal. --Basilar artery: Normal. --Anterior inferior cerebellar arteries: Not clearly visualized. --Posterior inferior cerebellar arteries: Normal. Venous sinuses: As permitted by  contrast timing, patent. Anatomic variants: None Delayed phase: Not performed. Review of the MIP images confirms the above findings IMPRESSION: 1. 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 Distal collateral flow within the left MCA distribution persists and is relatively symmetric to the right side. 2. 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 3. At least 90% stenosis at the origin of the right internal carotid artery secondary to mixed calcified and noncalcified plaque. Critical Value/emergent results were called by telephone at the time of interpretation on 02/16/2017 at 1:25 am to Dr. Ritta Slot , who verbally acknowledged these results. Electronically Signed   By: Deatra Robinson M.D.   On: 02/16/2017 01:47   Ct Head Wo Contrast  Result Date: 02/16/2017 CLINICAL DATA:  Left MCA stroke, status post intervention. EXAM: CT HEAD WITHOUT CONTRAST TECHNIQUE: Contiguous axial images were obtained from the base of the skull through the vertex without intravenous contrast. COMPARISON:  CTA head neck 02/16/2017 FINDINGS: Brain: Gray-white differentiation in the left MCA territory is maintained. The basal ganglia and insular ribbons are preserved. There is no evidence of acute hemorrhage. There is residual contrast material opacifying the venous sinuses. Vascular: The previously seen hyperdense thrombus within the left middle cerebral artery is no longer present. Skull: Normal Sinuses/Orbits: Moderate bilateral maxillary mucosal thickening. Normal orbits. Mastoids are clear. Other: None. IMPRESSION: 1. Status post stroke intervention with resolution of previously seen hyperdense thrombus in the left middle cerebral artery. 2. No acute hemorrhage or cytotoxic edema. Electronically Signed   By: Deatra Robinson M.D.   On: 02/16/2017 06:43   Ct  Angio Neck W Or Wo Contrast  Result Date: 02/16/2017 CLINICAL DATA:  Right-sided weakness and aphasia EXAM: CT ANGIOGRAPHY HEAD AND NECK TECHNIQUE: Multidetector CT imaging of the head and neck was performed using the standard protocol during bolus administration of intravenous contrast. Multiplanar CT image reconstructions and MIPs were obtained to evaluate the vascular anatomy. Carotid  stenosis measurements (when applicable) are obtained utilizing NASCET criteria, using the distal internal carotid diameter as the denominator. CONTRAST:  50 mL Isovue 370 IV COMPARISON:  Head CT 02/16/2017 FINDINGS: CTA NECK FINDINGS Aortic arch: There is no aneurysm or dissection of the visualized ascending aorta or aortic arch. There is a normal 3 vessel branching pattern. The visualized proximal subclavian arteries are widely patent. There is aortic atherosclerotic calcification. Right carotid system: There is multifocal atherosclerotic calcification within the right common carotid artery. There is mixed calcified and noncalcified plaque within the proximal right internal carotid artery with at least 90% stenosis. The distal right ICA is normal. Left carotid system: There is calcification at the origin of the left common carotid artery. The left internal carotid artery is occluded at its origin and along the remainder of its cervical course to the skullbase. There is return of contrast enhancement at the distal petrous segment. Vertebral arteries: The vertebral system is codominant. Both vertebral artery origins are patent. There is multifocal irregular narrowing of the left vertebral artery without hemodynamically significant stenosis. Both V3 and V4 segments are normal. The vertebrobasilar confluence is normal. Skeleton: There is no bony spinal canal stenosis. No lytic or blastic lesions. Other neck: The nasopharynx is clear. The oropharynx and hypopharynx are normal. The epiglottis is normal. The supraglottic larynx, glottis  and subglottic larynx are normal. No retropharyngeal collection. The parapharyngeal spaces are preserved. The parotid and submandibular glands are normal. No sialolithiasis or salivary ductal dilatation. The thyroid gland is normal. There is no cervical lymphadenopathy. Upper chest: No pneumothorax or pleural effusion. No nodules or masses. Review of the MIP images confirms the above findings CTA HEAD FINDINGS Anterior circulation: --Intracranial internal carotid arteries: The left ICA is occluded without opacification of the proximal petrous segment. The distal petrous segment and the remainder of the intracranial left ICA are opacified. There is mild atherosclerotic calcification of the cavernous and clinoid segments. The right side is normal. --Anterior cerebral arteries: Normal. --Middle cerebral arteries: There is complete occlusion of the left middle cerebral artery at its origin. No enhancement of the M1 or and 2 segments is seen. The distal small vessels in the left MCA distribution are relatively symmetric compared to the right. The right MCA is normal. --Posterior communicating arteries: Present bilaterally. Posterior circulation: --Posterior cerebral arteries: Normal. --Superior cerebellar arteries: Normal. --Basilar artery: Normal. --Anterior inferior cerebellar arteries: Not clearly visualized. --Posterior inferior cerebellar arteries: Normal. Venous sinuses: As permitted by contrast timing, patent. Anatomic variants: None Delayed phase: Not performed. Review of the MIP images confirms the above findings IMPRESSION: 1. 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 Distal collateral flow within the left MCA distribution persists and is relatively symmetric to the right side. 2. 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 3. At least 90%  stenosis at the origin of the right internal carotid artery secondary to mixed calcified and noncalcified plaque. Critical Value/emergent results were called by telephone at the time of interpretation on 02/16/2017 at 1:25 am to Dr. Ritta Slot , who verbally acknowledged these results. Electronically Signed   By: Deatra Robinson M.D.   On: 02/16/2017 01:47   Ir US Guide Vasc Access Left  Result Date: 02/16/2017 INDICATION: 61 year old male with acute right-sided weakness secondary to acute left hemisphere stroke. CT demonstrates left sided emergent large vessel occlusion of  the M1 segment. Last known well 11:30 p.m., and within tPA time frame. IV tPA administered, and given his baseline function, angiogram for mechanical thrombectomy was pursued. CTA demonstrates left ICA occlusion. 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 COMPLICATIONS: None TECHNIQUE: Informed written consent was obtained from the patient's family/wife after a thorough discussion of the procedural risks, benefits and alternatives. Specific risks discussed include: Bleeding, infection, contrast reaction, kidney injury/failure, need for further procedure/surgery, arterial injury or dissection, embolization to new territory, intracranial hemorrhage (10-15% risk), neurologic deterioration, cardiopulmonary collapse, death. All questions were  addressed. Maximal Sterile Barrier Technique was utilized including during the procedure including caps, mask, sterile gowns, sterile gloves, sterile drape, hand hygiene and skin antiseptic. A timeout was performed prior to the initiation of the procedure. Ultrasound survey of the left inguinal region was performed with images stored and sent to PACs. A micropuncture needle was used access the left common femoral artery under ultrasound. With excellent arterial blood flow returned, an .018 micro wire was passed through the needle, observed to enter the abdominal aorta under fluoroscopy. The needle was removed, and a micropuncture sheath was placed over the wire. The inner dilator and wire were removed, and an 035 Bentson wire was advanced under fluoroscopy into the abdominal aorta. The sheath was removed and a standard 4 [place] vascular sheath was placed for hemodynamic monitoring. The dilator was removed and the sheath was flushed. Ultrasound survey of the right inguinal region was performed with images stored and sent to PACs. A micropuncture needle was used access the right common femoral artery under ultrasound. With excellent arterial blood flow returned, an .018 micro wire was passed through the needle, observed to enter the abdominal aorta under fluoroscopy. The needle was removed, and a micropuncture sheath was placed over the wire. The inner dilator and wire were removed, and an 035 Bentson wire was advanced under fluoroscopy into the abdominal aorta. The sheath was removed and a standard 5 [place] vascular sheath was placed for hemodynamic monitoring. The dilator was removed and the sheath was flushed. A 76F JB-1 diagnostic catheter was advanced over the wire to the proximal descending thoracic aorta. Wire was then removed. Double flush of the catheter was performed. Catheter was then used to select the right common carotid artery. Angiogram was performed of the right-sided cervical and cerebral vasculature.  Catheter was then withdrawn into the aortic arch, and the origin of the left common carotid artery was selected. Road map angiogram was performed. Standard Glidewire was used to advanced the catheter into the left common carotid artery. Formal angiogram was then performed, confirming the left common carotid artery occlusion and left MCA occlusion. The Glidewire was then used to advance the JB 1 catheter into the external carotid artery branches. Glidewire was then exchanged for a Corporate investment banker. Exchange length Rosen wire was then passed through the diagnostic catheter to the distal common carotid artery and the diagnostic catheter was removed. The 5 French sheath was removed and exchanged for 8 French 55 centimeter BrightTip sheath. Sheath was flushed  and attached to pressurized and heparinized saline bag for constant forward flow. Then an 8 [place], 85 cm Flowgate balloon tip catheter was prepared on the back table with inflation of the balloon with 50/50 concentration of dilute contrast. The balloon catheter was then advanced over the wire, positioned into the distal common carotid artery. Copious back flush was performed and the balloon catheter was attached to heparinized and pressurized saline bag for forward flow. Rapid transit microcatheter and soft tip Transcend wire were then used to probe the origin of the common carotid artery. The micro wiring catheter combination were successful with crossing the acute occlusion, with the wire catheter combination place distally into the left ICA. Wire was removed and back flow blood was confirmed to confirm luminal position. Small contrast injection through the microcatheter was performed. Exchange length Transcend wire was then placed through the microcatheter, it and a rapid transit was removed over the wire. Balloon angioplasty was then performed at the left ICA occlusion with a 4mm x 30mm Via-Trak rapid exchange angioplasty balloon. Given the recalcitrant stenosis, a  5mm x 30mm balloon was selected with balloon angioplasty to 7 atmosphere. The balloon was then removed, and Provue18 catheter was advanced over the Transcend wire. Transcend wire was removed and a synchro soft micro wire was selected. Micro wire was then advanced into the MCA. The micro wire was then carefully advanced through the occluded segment. Microcatheter would not pass beyond the ophthalmic segment, likely secondary to the incomplete support system with the balloon catheter below the ICA origin. The exchange length Transcend wire was then placed through the microcatheter, and the microcatheter removed. An intermediate CAT 6 was selected, placed over the micro wire into the distal common carotid artery. The Provue 18 catheter was then advanced through the intermediate catheter while observing the distal aspect of the wire. The combination of the microcatheter and the intermediate catheter were successful in navigating to the skullbase through the persisting stenosis at the right ICA origin. Single attempt a passing the balloon catheter into the ICA was unsuccessful. Tip of the balloon catheter remained in the common carotid artery. The extra support from the intermediate catheter was useful in navigating the micro wire and microcatheter through the occlusion of the MCA into parietal branch. Micro wire was then removed. Blood was then aspirated through the hub of the microcatheter, and a gentle contrast injection was performed confirming intraluminal position. A rotating hemostatic valve was then attached to the back end of the microcatheter, and a pressurized and heparinized saline bag was attached to the catheter. 4 x 40 solitaire device was then selected. Back flush was achieved at the rotating hemostatic valve, and then the device was gently advanced through the microcatheter to the distal end. The retriever was then unsheathed by withdrawing the microcatheter under fluoroscopy. Once the retriever was  completely unsheathed, control angiogram was performed from the intermediate catheter. 3 minutes of the stent intercolation. Intermediate catheter was then advanced through the ophthalmic segment into the proximal MCA at the proximal aspect of the solitaire. Constant aspiration was then performed at the tip of the intermediate catheter as the retriever was gently and slowly withdrawn with fluoroscopic observation. Once the retriever was entirely removed from the system, free aspiration was confirmed at the hub of the intermediate catheter, with free blood return confirmed. Significant thrombus was retrieved from the solitaire device. Repeat angiogram was then performed through the intermediate catheter confirming restoration of TICI3 flow. The intermediate catheter was then withdrawn into  the cervical ICA, in preparation to address the left ICA stenosis with anticipated stent placement. Exchange length Transcend wire was placed through the intermediate catheter which was then removed. Once the exchange length trans and wire was in place, a control angiogram was performed, and there was attempted placement of our selected 8-6 x 40mm Exact stent. The stent would not pass through the lesion at the proximal common carotid artery. Stent was removed from the wire and repeat balloon angioplasty with 5 mm balloon was performed. Again, the stent would not pass through the lesion. Stent was removed from the wire, and a combination of 018 SV8 wire and the rapid transit were used to place the 018 buddy wire adjacent to the 0.014 Transcend wire. This was successful in straightening the carotid tortuosity, however, when there was attempt made to pass the stent system with 2 wire in place, the lumen on the flow gate catheter would not accept the buddy wire. The stent system was then removed, and a final balloon angioplasty was performed with 5 mm diameter balloon and the 018 SV 8 wire has a modified cutting balloon technique.  Eight atmospheric was achieved (nominal diameter). Final attempt at passing the stent across the lesion was unsuccessful, and attempted stenting was abandoned after discussion with Dr. Amada Jupiter of Stroke Neurology. Final control angiogram of the left carotid system was performed. Control angiogram was performed at the right common femoral artery puncture site, after the 55 centimeter 8 French sheath was exchanged for a standard 9 French sheath at the common femoral artery puncture site. Patient tolerated the procedure well and remained hemodynamically stable throughout. No complications were encountered. Estimated blood loss approximately 150 cc. FINDINGS: Ultrasound survey of the left common femoral artery demonstrates wide patency. Ultrasound survey of the right common femoral artery demonstrates wide patency. Initial images: Right common carotid artery:  Normal course caliber and contour. Right external carotid artery: Patent with antegrade flow. Right internal carotid artery: Significant stenosis at the proximal right internal carotid artery with a string sign. The percent stenosis estimated greater than 73% fine ask at criteria, however, this is favored to be greater given there is decreased caliber of the distal ICA secondary to decreased flow. Right MCA: M1 segment patent. The arterial, capillary/ parenchymal, and venous phase are delayed given the significant stenosis proximally. Contralateral filling of the left anterior cerebral artery with patent anterior communicating artery and large right A1 segment. Right ACA: Large right A1 segment with perfusion of left and right ACA territory. Left common carotid artery: Left common carotid artery origin is a common or drain from the base of the innominate artery. Mild plaque at the origin. Unremarkable course caliber and contour of the common carotid artery. Left external carotid artery: Patent with antegrade flow. Left internal carotid artery: Advanced  calcified and soft plaque at the carotid bifurcation and the left internal carotid artery with occlusion at the initiation of the case. Distal segment is patent, which was proven after crossing of the proximal occlusion. Left MCA: M1 occlusion secondary to thrombus was confirmed on the initial angiogram. Left ACA: Occlusion extended into the left A1 segment, compatible with a T occlusion Final images: After successful thrombectomy, there is restoration of TICI3 flow of the left hemisphere. Restoration of flow through the left A1 segment. Residual high-grade stenosis at the left ICA origin which was recalcitrant to multiple balloon angioplasty up to 5 mm with a cutting balloon technique. Dense plaque at the left ICA origin as well as  the geometry of the ICA origin precluded placement of the selected stent. Final image demonstrates flow through the left ICA and left MCA territory PROCEDURE: Cervical cerebral angiogram with left MCA thrombectomy with a combined aspiration and solitaire technique. 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 Restoration of TICI 3 flow was achieved with 1 pass of solitaire device and local aspiration, and flow through the left ICA occlusion was re-established by balloon angioplasty. Dense calcified plaque and the geometry of the proximal left ICA precluded placement of the selected Exact stent, and the patient will remain on medical therapy. Angiogram of the right carotid system demonstrates high-grade stenosis of proximal right ICA secondary to advanced atherosclerotic changes. Although the angiographic measurement of the string sign is 73% stenosis by NASCET criteria, the degree of stenosis is favored to be greater, as the distal vessel is compromised in diameter given the low flow. Signed, Yvone Neu. Loreta Ave DO [facility] Radiology PLAN: Noncontrast head CT postprocedure. ICU admission. Continue anti-platelet medication. Right common femoral 9 French sheath maintained which will be removed after 12 hours. Left common femoral 4 French sheath maintained for hemodynamic monitoring. Electronically Signed   By: Gilmer Mor D.O.   On: 02/16/2017 11:18   Ir US Guide Vasc Access Right  Result Date: 02/16/2017 INDICATION: 61 year old male with acute right-sided weakness secondary to acute left hemisphere stroke. CT demonstrates left sided emergent large vessel occlusion of the M1 segment. Last known well 11:30 p.m., and within tPA time frame. IV tPA administered, and given his baseline function, angiogram for mechanical thrombectomy was pursued. CTA demonstrates left ICA occlusion. 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 COMPLICATIONS: None TECHNIQUE: Informed written consent was obtained from the patient's family/wife after a thorough discussion of the procedural risks, benefits and alternatives. Specific risks discussed include: Bleeding, infection, contrast reaction, kidney injury/failure, need for further procedure/surgery, arterial injury or dissection, embolization to new territory, intracranial hemorrhage (10-15% risk), neurologic deterioration, cardiopulmonary collapse, death. All questions were  addressed. Maximal Sterile Barrier Technique was utilized including during the procedure including caps, mask, sterile gowns, sterile gloves, sterile drape, hand hygiene and skin antiseptic. A timeout was performed prior to the initiation of the procedure. Ultrasound survey of the left inguinal region was performed with images stored and sent to PACs. A micropuncture needle was used access the left common femoral artery under ultrasound. With excellent arterial blood flow returned, an .018 micro wire was passed through the needle, observed to enter the abdominal aorta under fluoroscopy. The needle was removed, and a micropuncture sheath was placed over the wire. The inner dilator and wire were removed, and an 035 Bentson wire was advanced under fluoroscopy into the abdominal aorta. The sheath was removed and a standard 4 [place] vascular sheath was placed for hemodynamic monitoring. The dilator was removed and the sheath was flushed. Ultrasound survey of the right inguinal region was performed with  images stored and sent to PACs. A micropuncture needle was used access the right common femoral artery under ultrasound. With excellent arterial blood flow returned, an .018 micro wire was passed through the needle, observed to enter the abdominal aorta under fluoroscopy. The needle was removed, and a micropuncture sheath was placed over the wire. The inner dilator and wire were removed, and an 035 Bentson wire was advanced under fluoroscopy into the abdominal aorta. The sheath was removed and a standard 5 [place] vascular sheath was placed for hemodynamic monitoring. The dilator was removed and the sheath was flushed. A 35F JB-1 diagnostic catheter was advanced over the wire to the proximal descending thoracic aorta. Wire was then removed. Double flush of the catheter was performed. Catheter was then used to select the right common carotid artery. Angiogram was performed of the right-sided cervical and cerebral vasculature.  Catheter was then withdrawn into the aortic arch, and the origin of the left common carotid artery was selected. Road map angiogram was performed. Standard Glidewire was used to advanced the catheter into the left common carotid artery. Formal angiogram was then performed, confirming the left common carotid artery occlusion and left MCA occlusion. The Glidewire was then used to advance the JB 1 catheter into the external carotid artery branches. Glidewire was then exchanged for a Corporate investment banker. Exchange length Rosen wire was then passed through the diagnostic catheter to the distal common carotid artery and the diagnostic catheter was removed. The 5 French sheath was removed and exchanged for 8 French 55 centimeter BrightTip sheath. Sheath was flushed and attached to pressurized and heparinized saline bag for constant forward flow. Then an 8 [place], 85 cm Flowgate balloon tip catheter was prepared on the back table with inflation of the balloon with 50/50 concentration of dilute contrast. The balloon catheter was then advanced over the wire, positioned into the distal common carotid artery. Copious back flush was performed and the balloon catheter was attached to heparinized and pressurized saline bag for forward flow. Rapid transit microcatheter and soft tip Transcend wire were then used to probe the origin of the common carotid artery. The micro wiring catheter combination were successful with crossing the acute occlusion, with the wire catheter combination place distally into the left ICA. Wire was removed and back flow blood was confirmed to confirm luminal position. Small contrast injection through the microcatheter was performed. Exchange length Transcend wire was then placed through the microcatheter, it and a rapid transit was removed over the wire. Balloon angioplasty was then performed at the left ICA occlusion with a 4mm x 30mm Via-Trak rapid exchange angioplasty balloon. Given the recalcitrant stenosis, a  5mm x 30mm balloon was selected with balloon angioplasty to 7 atmosphere. The balloon was then removed, and Provue18 catheter was advanced over the Transcend wire. Transcend wire was removed and a synchro soft micro wire was selected. Micro wire was then advanced into the MCA. The micro wire was then carefully advanced through the occluded segment. Microcatheter would not pass beyond the ophthalmic segment, likely secondary to the incomplete support system with the balloon catheter below the ICA origin. The exchange length Transcend wire was then placed through the microcatheter, and the microcatheter removed. An intermediate CAT 6 was selected, placed over the micro wire into the distal common carotid artery. The Provue 18 catheter was then advanced through the intermediate catheter while observing the distal aspect of the wire. The combination of the microcatheter and the intermediate catheter were successful in navigating to the  skullbase through the persisting stenosis at the right ICA origin. Single attempt a passing the balloon catheter into the ICA was unsuccessful. Tip of the balloon catheter remained in the common carotid artery. The extra support from the intermediate catheter was useful in navigating the micro wire and microcatheter through the occlusion of the MCA into parietal branch. Micro wire was then removed. Blood was then aspirated through the hub of the microcatheter, and a gentle contrast injection was performed confirming intraluminal position. A rotating hemostatic valve was then attached to the back end of the microcatheter, and a pressurized and heparinized saline bag was attached to the catheter. 4 x 40 solitaire device was then selected. Back flush was achieved at the rotating hemostatic valve, and then the device was gently advanced through the microcatheter to the distal end. The retriever was then unsheathed by withdrawing the microcatheter under fluoroscopy. Once the retriever was  completely unsheathed, control angiogram was performed from the intermediate catheter. 3 minutes of the stent intercolation. Intermediate catheter was then advanced through the ophthalmic segment into the proximal MCA at the proximal aspect of the solitaire. Constant aspiration was then performed at the tip of the intermediate catheter as the retriever was gently and slowly withdrawn with fluoroscopic observation. Once the retriever was entirely removed from the system, free aspiration was confirmed at the hub of the intermediate catheter, with free blood return confirmed. Significant thrombus was retrieved from the solitaire device. Repeat angiogram was then performed through the intermediate catheter confirming restoration of TICI3 flow. The intermediate catheter was then withdrawn into the cervical ICA, in preparation to address the left ICA stenosis with anticipated stent placement. Exchange length Transcend wire was placed through the intermediate catheter which was then removed. Once the exchange length trans and wire was in place, a control angiogram was performed, and there was attempted placement of our selected 8-6 x 40mm Exact stent. The stent would not pass through the lesion at the proximal common carotid artery. Stent was removed from the wire and repeat balloon angioplasty with 5 mm balloon was performed. Again, the stent would not pass through the lesion. Stent was removed from the wire, and a combination of 018 SV8 wire and the rapid transit were used to place the 018 buddy wire adjacent to the 0.014 Transcend wire. This was successful in straightening the carotid tortuosity, however, when there was attempt made to pass the stent system with 2 wire in place, the lumen on the flow gate catheter would not accept the buddy wire. The stent system was then removed, and a final balloon angioplasty was performed with 5 mm diameter balloon and the 018 SV 8 wire has a modified cutting balloon technique.  Eight atmospheric was achieved (nominal diameter). Final attempt at passing the stent across the lesion was unsuccessful, and attempted stenting was abandoned after discussion with Dr. Amada Jupiter of Stroke Neurology. Final control angiogram of the left carotid system was performed. Control angiogram was performed at the right common femoral artery puncture site, after the 55 centimeter 8 French sheath was exchanged for a standard 9 French sheath at the common femoral artery puncture site. Patient tolerated the procedure well and remained hemodynamically stable throughout. No complications were encountered. Estimated blood loss approximately 150 cc. FINDINGS: Ultrasound survey of the left common femoral artery demonstrates wide patency. Ultrasound survey of the right common femoral artery demonstrates wide patency. Initial images: Right common carotid artery:  Normal course caliber and contour. Right external carotid artery: Patent with antegrade  flow. Right internal carotid artery: Significant stenosis at the proximal right internal carotid artery with a string sign. The percent stenosis estimated greater than 73% fine ask at criteria, however, this is favored to be greater given there is decreased caliber of the distal ICA secondary to decreased flow. Right MCA: M1 segment patent. The arterial, capillary/ parenchymal, and venous phase are delayed given the significant stenosis proximally. Contralateral filling of the left anterior cerebral artery with patent anterior communicating artery and large right A1 segment. Right ACA: Large right A1 segment with perfusion of left and right ACA territory. Left common carotid artery: Left common carotid artery origin is a common or drain from the base of the innominate artery. Mild plaque at the origin. Unremarkable course caliber and contour of the common carotid artery. Left external carotid artery: Patent with antegrade flow. Left internal carotid artery: Advanced  calcified and soft plaque at the carotid bifurcation and the left internal carotid artery with occlusion at the initiation of the case. Distal segment is patent, which was proven after crossing of the proximal occlusion. Left MCA: M1 occlusion secondary to thrombus was confirmed on the initial angiogram. Left ACA: Occlusion extended into the left A1 segment, compatible with a T occlusion Final images: After successful thrombectomy, there is restoration of TICI3 flow of the left hemisphere. Restoration of flow through the left A1 segment. Residual high-grade stenosis at the left ICA origin which was recalcitrant to multiple balloon angioplasty up to 5 mm with a cutting balloon technique. Dense plaque at the left ICA origin as well as the geometry of the ICA origin precluded placement of the selected stent. Final image demonstrates flow through the left ICA and left MCA territory PROCEDURE: Cervical cerebral angiogram with left MCA thrombectomy with a combined aspiration and solitaire technique. 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 Restoration of TICI 3 flow was achieved with 1 pass of solitaire device and local aspiration, and flow through the left ICA occlusion was re-established by balloon angioplasty. Dense calcified plaque and the geometry of the proximal left ICA precluded placement of the selected Exact stent, and the patient will remain on medical therapy. Angiogram of the right carotid system demonstrates high-grade stenosis of proximal right ICA secondary to advanced atherosclerotic changes. Although the angiographic measurement of the string sign is 73% stenosis by NASCET criteria, the degree of stenosis is favored to be greater, as the distal vessel is compromised in diameter given the low flow. Signed, Yvone Neu. Loreta Ave DO Vascular and Interventional  [facility] Radiology PLAN: Noncontrast head CT postprocedure. ICU admission. Continue anti-platelet medication. Right common femoral 9 French sheath maintained which will be removed after 12 hours. Left common femoral 4 French sheath maintained for hemodynamic monitoring. Electronically Signed   By: Gilmer Mor D.O.   On: 02/16/2017 11:18   Dg Chest Port 1 View  Result Date: 02/16/2017 CLINICAL DATA:  Respiratory failure, intubated EXAM: PORTABLE CHEST 1 VIEW COMPARISON:  None available FINDINGS: Endotracheal tube 4.3 cm above the carina. NG tube within the stomach, tip not visualized. Marked cardiomegaly with central vascular congestion and low lung volumes. Mild streaky edema pattern and basilar atelectasis throughout both lungs. No large effusion or pneumothorax. Trachea is midline. No acute osseous finding. Remote postop changes of the right shoulder. IMPRESSION: Cardiomegaly with mild central vascular congestion and low lung volumes Scattered mild bilateral streaky  edema pattern and atelectasis. Support apparatus in good position as above. Electronically Signed   By: Judie Petit.  Shick M.D.   On: 02/16/2017 08:54   Ir Percutaneous Art Thrombectomy/infusion Intracranial Inc Diag Angio  Result Date: 02/16/2017 INDICATION: 61 year old male with acute right-sided weakness secondary to acute left hemisphere stroke. CT demonstrates left sided emergent large vessel occlusion of the M1 segment. Last known well 11:30 p.m., and within tPA time frame. IV tPA administered, and given his baseline function, angiogram for mechanical thrombectomy was pursued. CTA demonstrates left ICA occlusion. EXAM: ULTRASOUND-GUIDED LEFT COMMON FEMORAL ARTERY ACCESS FOR HEMODYNAMIC MONITORING ULTRASOUND-GUIDED RIGHT COMMON FEMORAL ARTERY ACCESS FOR ANGIOGRAM CERVICO CEREBRAL ANGIOGRAM, WITH COMPLETION CERVICO CEREBRAL ANGIOGRAM AFTER THROMBECTOMY ANGIOPLASTY OF LEFT INTERNAL CAROTID ARTERY FOR ACUTE ICA OCCLUSION  MECHANICAL THROMBECTOMY FOR EMERGENT LARGE VESSEL OCCLUSION LEFT MCA COMPARISON:  CT 02/16/2017, CT angiogram 02/16/2017 MEDICATIONS: 300 mg Plavix, 325 mg aspirin ANESTHESIA/SEDATION: General endotracheal tube anesthesia with the anesthesia team CONTRAST:  180 cc FLUOROSCOPY TIME:  Fluoroscopy Time: 59 minutes 24 seconds (5229 mGy). COMPLICATIONS: None TECHNIQUE: Informed written consent was obtained from the patient's family/wife after a thorough discussion of the procedural risks, benefits and alternatives. Specific risks discussed include: Bleeding, infection, contrast reaction, kidney injury/failure, need for further procedure/surgery, arterial injury or dissection, embolization to new territory, intracranial hemorrhage (10-15% risk), neurologic deterioration, cardiopulmonary collapse, death. All questions were addressed. Maximal Sterile Barrier Technique was utilized including during the procedure including caps, mask, sterile gowns, sterile gloves, sterile drape, hand hygiene and skin antiseptic. A timeout was performed prior to the initiation of the procedure. Ultrasound survey of the left inguinal region was performed with images stored and sent to PACs. A micropuncture needle was used access the left common femoral artery under ultrasound. With excellent arterial blood flow returned, an .018 micro wire was passed through the needle, observed to enter the abdominal aorta under fluoroscopy. The needle was removed, and a micropuncture sheath was placed over the wire. The inner dilator and wire were removed, and an 035 Bentson wire was advanced under fluoroscopy into the abdominal aorta. The sheath was removed and a standard 4 [place] vascular sheath was placed for hemodynamic monitoring. The dilator was removed and the sheath was flushed. Ultrasound survey of the right inguinal region was performed with images stored and sent to PACs. A micropuncture needle was used access the right common femoral artery under  ultrasound. With excellent arterial blood flow returned, an .018 micro wire was passed through the needle, observed to enter the abdominal aorta under fluoroscopy. The needle was removed, and a micropuncture sheath was placed over the wire. The inner dilator and wire were removed, and an 035 Bentson wire was advanced under fluoroscopy into the abdominal aorta. The sheath was removed and a standard 5 [place] vascular sheath was placed for hemodynamic monitoring. The dilator was removed and the sheath was flushed. A 6F JB-1 diagnostic catheter was advanced over the wire to the proximal descending thoracic aorta. Wire was then removed. Double flush of the catheter was performed. Catheter was then used to select the right common carotid artery. Angiogram was performed of the right-sided cervical and cerebral vasculature. Catheter was then withdrawn into the aortic arch, and the origin of the left common carotid artery was selected. Road map angiogram was performed. Standard Glidewire was used to advanced the catheter into the left common carotid artery. Formal angiogram was then performed, confirming the left common carotid artery occlusion and left MCA occlusion. The Glidewire was then used  to advance the JB 1 catheter into the external carotid artery branches. Glidewire was then exchanged for a Corporate investment banker. Exchange length Rosen wire was then passed through the diagnostic catheter to the distal common carotid artery and the diagnostic catheter was removed. The 5 French sheath was removed and exchanged for 8 French 55 centimeter BrightTip sheath. Sheath was flushed and attached to pressurized and heparinized saline bag for constant forward flow. Then an 8 [place], 85 cm Flowgate balloon tip catheter was prepared on the back table with inflation of the balloon with 50/50 concentration of dilute contrast. The balloon catheter was then advanced over the wire, positioned into the distal common carotid artery. Copious back  flush was performed and the balloon catheter was attached to heparinized and pressurized saline bag for forward flow. Rapid transit microcatheter and soft tip Transcend wire were then used to probe the origin of the common carotid artery. The micro wiring catheter combination were successful with crossing the acute occlusion, with the wire catheter combination place distally into the left ICA. Wire was removed and back flow blood was confirmed to confirm luminal position. Small contrast injection through the microcatheter was performed. Exchange length Transcend wire was then placed through the microcatheter, it and a rapid transit was removed over the wire. Balloon angioplasty was then performed at the left ICA occlusion with a 4mm x 30mm Via-Trak rapid exchange angioplasty balloon. Given the recalcitrant stenosis, a 5mm x 30mm balloon was selected with balloon angioplasty to 7 atmosphere. The balloon was then removed, and Provue18 catheter was advanced over the Transcend wire. Transcend wire was removed and a synchro soft micro wire was selected. Micro wire was then advanced into the MCA. The micro wire was then carefully advanced through the occluded segment. Microcatheter would not pass beyond the ophthalmic segment, likely secondary to the incomplete support system with the balloon catheter below the ICA origin. The exchange length Transcend wire was then placed through the microcatheter, and the microcatheter removed. An intermediate CAT 6 was selected, placed over the micro wire into the distal common carotid artery. The Provue 18 catheter was then advanced through the intermediate catheter while observing the distal aspect of the wire. The combination of the microcatheter and the intermediate catheter were successful in navigating to the skullbase through the persisting stenosis at the right ICA origin. Single attempt a passing the balloon catheter into the ICA was unsuccessful. Tip of the balloon catheter  remained in the common carotid artery. The extra support from the intermediate catheter was useful in navigating the micro wire and microcatheter through the occlusion of the MCA into parietal branch. Micro wire was then removed. Blood was then aspirated through the hub of the microcatheter, and a gentle contrast injection was performed confirming intraluminal position. A rotating hemostatic valve was then attached to the back end of the microcatheter, and a pressurized and heparinized saline bag was attached to the catheter. 4 x 40 solitaire device was then selected. Back flush was achieved at the rotating hemostatic valve, and then the device was gently advanced through the microcatheter to the distal end. The retriever was then unsheathed by withdrawing the microcatheter under fluoroscopy. Once the retriever was completely unsheathed, control angiogram was performed from the intermediate catheter. 3 minutes of the stent intercolation. Intermediate catheter was then advanced through the ophthalmic segment into the proximal MCA at the proximal aspect of the solitaire. Constant aspiration was then performed at the tip of the intermediate catheter as the retriever was  gently and slowly withdrawn with fluoroscopic observation. Once the retriever was entirely removed from the system, free aspiration was confirmed at the hub of the intermediate catheter, with free blood return confirmed. Significant thrombus was retrieved from the solitaire device. Repeat angiogram was then performed through the intermediate catheter confirming restoration of TICI3 flow. The intermediate catheter was then withdrawn into the cervical ICA, in preparation to address the left ICA stenosis with anticipated stent placement. Exchange length Transcend wire was placed through the intermediate catheter which was then removed. Once the exchange length trans and wire was in place, a control angiogram was performed, and there was attempted placement  of our selected 8-6 x 40mm Exact stent. The stent would not pass through the lesion at the proximal common carotid artery. Stent was removed from the wire and repeat balloon angioplasty with 5 mm balloon was performed. Again, the stent would not pass through the lesion. Stent was removed from the wire, and a combination of 018 SV8 wire and the rapid transit were used to place the 018 buddy wire adjacent to the 0.014 Transcend wire. This was successful in straightening the carotid tortuosity, however, when there was attempt made to pass the stent system with 2 wire in place, the lumen on the flow gate catheter would not accept the buddy wire. The stent system was then removed, and a final balloon angioplasty was performed with 5 mm diameter balloon and the 018 SV 8 wire has a modified cutting balloon technique. Eight atmospheric was achieved (nominal diameter). Final attempt at passing the stent across the lesion was unsuccessful, and attempted stenting was abandoned after discussion with Dr. Amada Jupiter of Stroke Neurology. Final control angiogram of the left carotid system was performed. Control angiogram was performed at the right common femoral artery puncture site, after the 55 centimeter 8 French sheath was exchanged for a standard 9 French sheath at the common femoral artery puncture site. Patient tolerated the procedure well and remained hemodynamically stable throughout. No complications were encountered. Estimated blood loss approximately 150 cc. FINDINGS: Ultrasound survey of the left common femoral artery demonstrates wide patency. Ultrasound survey of the right common femoral artery demonstrates wide patency. Initial images: Right common carotid artery:  Normal course caliber and contour. Right external carotid artery: Patent with antegrade flow. Right internal carotid artery: Significant stenosis at the proximal right internal carotid artery with a string sign. The percent stenosis estimated greater  than 73% fine ask at criteria, however, this is favored to be greater given there is decreased caliber of the distal ICA secondary to decreased flow. Right MCA: M1 segment patent. The arterial, capillary/ parenchymal, and venous phase are delayed given the significant stenosis proximally. Contralateral filling of the left anterior cerebral artery with patent anterior communicating artery and large right A1 segment. Right ACA: Large right A1 segment with perfusion of left and right ACA territory. Left common carotid artery: Left common carotid artery origin is a common or drain from the base of the innominate artery. Mild plaque at the origin. Unremarkable course caliber and contour of the common carotid artery. Left external carotid artery: Patent with antegrade flow. Left internal carotid artery: Advanced calcified and soft plaque at the carotid bifurcation and the left internal carotid artery with occlusion at the initiation of the case. Distal segment is patent, which was proven after crossing of the proximal occlusion. Left MCA: M1 occlusion secondary to thrombus was confirmed on the initial angiogram. Left ACA: Occlusion extended into the left A1 segment, compatible  with a T occlusion Final images: After successful thrombectomy, there is restoration of TICI3 flow of the left hemisphere. Restoration of flow through the left A1 segment. Residual high-grade stenosis at the left ICA origin which was recalcitrant to multiple balloon angioplasty up to 5 mm with a cutting balloon technique. Dense plaque at the left ICA origin as well as the geometry of the ICA origin precluded placement of the selected stent. Final image demonstrates flow through the left ICA and left MCA territory PROCEDURE: Cervical cerebral angiogram with left MCA thrombectomy with a combined aspiration and solitaire technique. 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 Restoration of TICI 3 flow was achieved with 1 pass of solitaire device and local aspiration, and flow through the left ICA occlusion was re-established by balloon angioplasty. Dense calcified plaque and the geometry of the proximal left ICA precluded placement of the selected Exact stent, and the patient will remain on medical therapy. Angiogram of the right carotid system demonstrates high-grade stenosis of proximal right ICA secondary to advanced atherosclerotic changes. Although the angiographic measurement of the string sign is 73% stenosis by NASCET criteria, the degree of stenosis is favored to be greater, as the distal vessel is compromised in diameter given the low flow. Signed, Yvone Neu. Loreta Ave DO [facility] Radiology PLAN: Noncontrast head CT postprocedure. ICU admission. Continue anti-platelet medication. Right common femoral 9 French sheath maintained which will be removed after 12 hours. Left common femoral 4 French sheath maintained for hemodynamic monitoring. Electronically Signed   By: Gilmer Mor D.O.   On: 02/16/2017 11:18   Ct Head Code Stroke W/o Cm  Result Date: 02/16/2017 CLINICAL DATA:  Code stroke.  Right-sided weakness and aphasia EXAM: CT HEAD WITHOUT CONTRAST TECHNIQUE: Contiguous axial images were obtained from the base of the skull through the vertex without intravenous contrast. COMPARISON:  None. FINDINGS: Brain: No mass lesion, intraparenchymal hemorrhage or extra-axial collection. No evidence of acute cortical infarct. Brain parenchyma and CSF-containing spaces are normal for age. Vascular: There is a hyperdense left MCA. Skull: Normal visualized skull base, calvarium and extracranial soft tissues. Sinuses/Orbits: No sinus fluid levels or advanced mucosal thickening. No mastoid effusion. Normal orbits. ASPECTS [facility] Stroke Program Early CT Score) -  Ganglionic level infarction (caudate, lentiform nuclei, internal capsule, insula, M1-M3 cortex): 7 - Supraganglionic infarction (M4-M6 cortex): 3 Total score (0-10 with 10 being normal): 10 IMPRESSION: 1. Hyper dense left middle cerebral artery, likely indicating the presence of acute thrombus. 2. ASPECTS is 10. 3. No intracranial hemorrhage. These results were called by telephone at the time of interpretation on 02/16/2017 at 12:53 am to Dr. Ritta Slot, who verbally acknowledged these results. Electronically Signed   By: Deatra Robinson M.D.   On: 02/16/2017 00:55   Ir Pta Non Coro-lower Extrem  Result Date: 02/16/2017 INDICATION: 61 year old male with acute right-sided weakness secondary to acute left hemisphere stroke. CT demonstrates left sided emergent large vessel occlusion of the M1 segment. Last known well 11:30 p.m., and within tPA time frame. IV tPA administered, and given his baseline function, angiogram for mechanical thrombectomy was pursued. CTA demonstrates left ICA occlusion. EXAM: ULTRASOUND-GUIDED LEFT COMMON FEMORAL ARTERY ACCESS FOR HEMODYNAMIC MONITORING ULTRASOUND-GUIDED RIGHT COMMON FEMORAL ARTERY ACCESS FOR ANGIOGRAM CERVICO CEREBRAL ANGIOGRAM, WITH COMPLETION CERVICO CEREBRAL ANGIOGRAM AFTER THROMBECTOMY ANGIOPLASTY OF LEFT INTERNAL CAROTID ARTERY FOR ACUTE ICA OCCLUSION  MECHANICAL THROMBECTOMY FOR EMERGENT LARGE VESSEL OCCLUSION LEFT MCA COMPARISON:  CT 02/16/2017, CT angiogram 02/16/2017 MEDICATIONS: 300 mg Plavix, 325 mg aspirin ANESTHESIA/SEDATION: General endotracheal tube anesthesia with the anesthesia team CONTRAST:  180 cc FLUOROSCOPY TIME:  Fluoroscopy Time: 59 minutes 24 seconds (5229 mGy). COMPLICATIONS: None TECHNIQUE: Informed written consent was obtained from the patient's family/wife after a thorough discussion of the procedural risks, benefits and alternatives. Specific risks discussed include: Bleeding, infection, contrast reaction, kidney injury/failure, need for  further procedure/surgery, arterial injury or dissection, embolization to new territory, intracranial hemorrhage (10-15% risk), neurologic deterioration, cardiopulmonary collapse, death. All questions were addressed. Maximal Sterile Barrier Technique was utilized including during the procedure including caps, mask, sterile gowns, sterile gloves, sterile drape, hand hygiene and skin antiseptic. A timeout was performed prior to the initiation of the procedure. Ultrasound survey of the left inguinal region was performed with images stored and sent to PACs. A micropuncture needle was used access the left common femoral artery under ultrasound. With excellent arterial blood flow returned, an .018 micro wire was passed through the needle, observed to enter the abdominal aorta under fluoroscopy. The needle was removed, and a micropuncture sheath was placed over the wire. The inner dilator and wire were removed, and an 035 Bentson wire was advanced under fluoroscopy into the abdominal aorta. The sheath was removed and a standard 4 [place] vascular sheath was placed for hemodynamic monitoring. The dilator was removed and the sheath was flushed. Ultrasound survey of the right inguinal region was performed with images stored and sent to PACs. A micropuncture needle was used access the right common femoral artery under ultrasound. With excellent arterial blood flow returned, an .018 micro wire was passed through the needle, observed to enter the abdominal aorta under fluoroscopy. The needle was removed, and a micropuncture sheath was placed over the wire. The inner dilator and wire were removed, and an 035 Bentson wire was advanced under fluoroscopy into the abdominal aorta. The sheath was removed and a standard 5 [place] vascular sheath was placed for hemodynamic monitoring. The dilator was removed and the sheath was flushed. A 29F JB-1 diagnostic catheter was advanced over the wire to the proximal descending thoracic aorta. Wire  was then removed. Double flush of the catheter was performed. Catheter was then used to select the right common carotid artery. Angiogram was performed of the right-sided cervical and cerebral vasculature. Catheter was then withdrawn into the aortic arch, and the origin of the left common carotid artery was selected. Road map angiogram was performed. Standard Glidewire was used to advanced the catheter into the left common carotid artery. Formal angiogram was then performed, confirming the left common carotid artery occlusion and left MCA occlusion. The Glidewire was then used to advance the JB 1 catheter into the external carotid artery branches. Glidewire was then exchanged for a Corporate investment banker. Exchange length Rosen wire was then passed through the diagnostic catheter to the distal common carotid artery and the diagnostic catheter was removed. The 5 French sheath was removed and exchanged for 8 French 55 centimeter BrightTip sheath. Sheath was flushed and attached to pressurized and heparinized saline bag for constant forward flow. Then an 8 [place], 85 cm Flowgate balloon tip catheter was prepared on the back table with inflation of the balloon with 50/50 concentration of dilute contrast. The balloon catheter was then advanced over the wire, positioned into the distal common carotid artery. Copious back flush was performed and the balloon catheter was attached to heparinized and pressurized  saline bag for forward flow. Rapid transit microcatheter and soft tip Transcend wire were then used to probe the origin of the common carotid artery. The micro wiring catheter combination were successful with crossing the acute occlusion, with the wire catheter combination place distally into the left ICA. Wire was removed and back flow blood was confirmed to confirm luminal position. Small contrast injection through the microcatheter was performed. Exchange length Transcend wire was then placed through the microcatheter, it and a  rapid transit was removed over the wire. Balloon angioplasty was then performed at the left ICA occlusion with a 4mm x 30mm Via-Trak rapid exchange angioplasty balloon. Given the recalcitrant stenosis, a 5mm x 30mm balloon was selected with balloon angioplasty to 7 atmosphere. The balloon was then removed, and Provue18 catheter was advanced over the Transcend wire. Transcend wire was removed and a synchro soft micro wire was selected. Micro wire was then advanced into the MCA. The micro wire was then carefully advanced through the occluded segment. Microcatheter would not pass beyond the ophthalmic segment, likely secondary to the incomplete support system with the balloon catheter below the ICA origin. The exchange length Transcend wire was then placed through the microcatheter, and the microcatheter removed. An intermediate CAT 6 was selected, placed over the micro wire into the distal common carotid artery. The Provue 18 catheter was then advanced through the intermediate catheter while observing the distal aspect of the wire. The combination of the microcatheter and the intermediate catheter were successful in navigating to the skullbase through the persisting stenosis at the right ICA origin. Single attempt a passing the balloon catheter into the ICA was unsuccessful. Tip of the balloon catheter remained in the common carotid artery. The extra support from the intermediate catheter was useful in navigating the micro wire and microcatheter through the occlusion of the MCA into parietal branch. Micro wire was then removed. Blood was then aspirated through the hub of the microcatheter, and a gentle contrast injection was performed confirming intraluminal position. A rotating hemostatic valve was then attached to the back end of the microcatheter, and a pressurized and heparinized saline bag was attached to the catheter. 4 x 40 solitaire device was then selected. Back flush was achieved at the rotating hemostatic  valve, and then the device was gently advanced through the microcatheter to the distal end. The retriever was then unsheathed by withdrawing the microcatheter under fluoroscopy. Once the retriever was completely unsheathed, control angiogram was performed from the intermediate catheter. 3 minutes of the stent intercolation. Intermediate catheter was then advanced through the ophthalmic segment into the proximal MCA at the proximal aspect of the solitaire. Constant aspiration was then performed at the tip of the intermediate catheter as the retriever was gently and slowly withdrawn with fluoroscopic observation. Once the retriever was entirely removed from the system, free aspiration was confirmed at the hub of the intermediate catheter, with free blood return confirmed. Significant thrombus was retrieved from the solitaire device. Repeat angiogram was then performed through the intermediate catheter confirming restoration of TICI3 flow. The intermediate catheter was then withdrawn into the cervical ICA, in preparation to address the left ICA stenosis with anticipated stent placement. Exchange length Transcend wire was placed through the intermediate catheter which was then removed. Once the exchange length trans and wire was in place, a control angiogram was performed, and there was attempted placement of our selected 8-6 x 40mm Exact stent. The stent would not pass through the lesion at the proximal common carotid  artery. Stent was removed from the wire and repeat balloon angioplasty with 5 mm balloon was performed. Again, the stent would not pass through the lesion. Stent was removed from the wire, and a combination of 018 SV8 wire and the rapid transit were used to place the 018 buddy wire adjacent to the 0.014 Transcend wire. This was successful in straightening the carotid tortuosity, however, when there was attempt made to pass the stent system with 2 wire in place, the lumen on the flow gate catheter would  not accept the buddy wire. The stent system was then removed, and a final balloon angioplasty was performed with 5 mm diameter balloon and the 018 SV 8 wire has a modified cutting balloon technique. Eight atmospheric was achieved (nominal diameter). Final attempt at passing the stent across the lesion was unsuccessful, and attempted stenting was abandoned after discussion with Dr. Amada Jupiter of Stroke Neurology. Final control angiogram of the left carotid system was performed. Control angiogram was performed at the right common femoral artery puncture site, after the 55 centimeter 8 French sheath was exchanged for a standard 9 French sheath at the common femoral artery puncture site. Patient tolerated the procedure well and remained hemodynamically stable throughout. No complications were encountered. Estimated blood loss approximately 150 cc. FINDINGS: Ultrasound survey of the left common femoral artery demonstrates wide patency. Ultrasound survey of the right common femoral artery demonstrates wide patency. Initial images: Right common carotid artery:  Normal course caliber and contour. Right external carotid artery: Patent with antegrade flow. Right internal carotid artery: Significant stenosis at the proximal right internal carotid artery with a string sign. The percent stenosis estimated greater than 73% fine ask at criteria, however, this is favored to be greater given there is decreased caliber of the distal ICA secondary to decreased flow. Right MCA: M1 segment patent. The arterial, capillary/ parenchymal, and venous phase are delayed given the significant stenosis proximally. Contralateral filling of the left anterior cerebral artery with patent anterior communicating artery and large right A1 segment. Right ACA: Large right A1 segment with perfusion of left and right ACA territory. Left common carotid artery: Left common carotid artery origin is a common or drain from the base of the innominate artery.  Mild plaque at the origin. Unremarkable course caliber and contour of the common carotid artery. Left external carotid artery: Patent with antegrade flow. Left internal carotid artery: Advanced calcified and soft plaque at the carotid bifurcation and the left internal carotid artery with occlusion at the initiation of the case. Distal segment is patent, which was proven after crossing of the proximal occlusion. Left MCA: M1 occlusion secondary to thrombus was confirmed on the initial angiogram. Left ACA: Occlusion extended into the left A1 segment, compatible with a T occlusion Final images: After successful thrombectomy, there is restoration of TICI3 flow of the left hemisphere. Restoration of flow through the left A1 segment. Residual high-grade stenosis at the left ICA origin which was recalcitrant to multiple balloon angioplasty up to 5 mm with a cutting balloon technique. Dense plaque at the left ICA origin as well as the geometry of the ICA origin precluded placement of the selected stent. Final image demonstrates flow through the left ICA and left MCA territory PROCEDURE: Cervical cerebral angiogram with left MCA thrombectomy with a combined aspiration and solitaire technique. 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 Restoration of TICI  3 flow was achieved with 1 pass of solitaire device and local aspiration, and flow through the left ICA occlusion was re-established by balloon angioplasty. Dense calcified plaque and the geometry of the proximal left ICA precluded placement of the selected Exact stent, and the patient will remain on medical therapy. Angiogram of the right carotid system demonstrates high-grade stenosis of proximal right ICA secondary to advanced atherosclerotic changes. Although the angiographic measurement of the string sign is 73% stenosis by  NASCET criteria, the degree of stenosis is favored to be greater, as the distal vessel is compromised in diameter given the low flow. Signed, Yvone Neu. Loreta Ave DO [facility] Radiology PLAN: Noncontrast head CT postprocedure. ICU admission. Continue anti-platelet medication. Right common femoral 9 French sheath maintained which will be removed after 12 hours. Left common femoral 4 French sheath maintained for hemodynamic monitoring. Electronically Signed   By: Gilmer Mor D.O.   On: 02/16/2017 11:18   Ir Angio Intra Extracran Sel Com Carotid Innominate Uni R Mod Sed  Result Date: 02/16/2017 INDICATION: 61 year old male with acute right-sided weakness secondary to acute left hemisphere stroke. CT demonstrates left sided emergent large vessel occlusion of the M1 segment. Last known well 11:30 p.m., and within tPA time frame. IV tPA administered, and given his baseline function, angiogram for mechanical thrombectomy was pursued. CTA demonstrates left ICA occlusion. 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 COMPLICATIONS: None TECHNIQUE: Informed written consent was obtained from the patient's family/wife after a thorough discussion of the procedural risks, benefits and alternatives. Specific risks discussed include: Bleeding, infection, contrast reaction, kidney  injury/failure, need for further procedure/surgery, arterial injury or dissection, embolization to new territory, intracranial hemorrhage (10-15% risk), neurologic deterioration, cardiopulmonary collapse, death. All questions were addressed. Maximal Sterile Barrier Technique was utilized including during the procedure including caps, mask, sterile gowns, sterile gloves, sterile drape, hand hygiene and skin antiseptic. A timeout was performed prior to the initiation of the procedure. Ultrasound survey of the left inguinal region was performed with images stored and sent to PACs. A micropuncture needle was used access the left common femoral artery under ultrasound. With excellent arterial blood flow returned, an .018 micro wire was passed through the needle, observed to enter the abdominal aorta under fluoroscopy. The needle was removed, and a micropuncture sheath was placed over the wire. The inner dilator and wire were removed, and an 035 Bentson wire was advanced under fluoroscopy into the abdominal aorta. The sheath was removed and a standard 4 [place] vascular sheath was placed for hemodynamic monitoring. The dilator was removed and the sheath was flushed. Ultrasound survey of the right inguinal region was performed with images stored and sent to PACs. A micropuncture needle was used access the right common femoral artery under ultrasound. With excellent arterial blood flow returned, an .018 micro wire was passed through the needle, observed to enter the abdominal aorta under fluoroscopy. The needle was removed, and a micropuncture sheath was placed over the wire. The inner dilator and wire were removed, and an  035 Bentson wire was advanced under fluoroscopy into the abdominal aorta. The sheath was removed and a standard 5 [place] vascular sheath was placed for hemodynamic monitoring. The dilator was removed and the sheath was flushed. A 39F JB-1 diagnostic catheter was advanced over the wire to the proximal  descending thoracic aorta. Wire was then removed. Double flush of the catheter was performed. Catheter was then used to select the right common carotid artery. Angiogram was performed of the right-sided cervical and cerebral vasculature. Catheter was then withdrawn into the aortic arch, and the origin of the left common carotid artery was selected. Road map angiogram was performed. Standard Glidewire was used to advanced the catheter into the left common carotid artery. Formal angiogram was then performed, confirming the left common carotid artery occlusion and left MCA occlusion. The Glidewire was then used to advance the JB 1 catheter into the external carotid artery branches. Glidewire was then exchanged for a Corporate investment banker. Exchange length Rosen wire was then passed through the diagnostic catheter to the distal common carotid artery and the diagnostic catheter was removed. The 5 French sheath was removed and exchanged for 8 French 55 centimeter BrightTip sheath. Sheath was flushed and attached to pressurized and heparinized saline bag for constant forward flow. Then an 8 [place], 85 cm Flowgate balloon tip catheter was prepared on the back table with inflation of the balloon with 50/50 concentration of dilute contrast. The balloon catheter was then advanced over the wire, positioned into the distal common carotid artery. Copious back flush was performed and the balloon catheter was attached to heparinized and pressurized saline bag for forward flow. Rapid transit microcatheter and soft tip Transcend wire were then used to probe the origin of the common carotid artery. The micro wiring catheter combination were successful with crossing the acute occlusion, with the wire catheter combination place distally into the left ICA. Wire was removed and back flow blood was confirmed to confirm luminal position. Small contrast injection through the microcatheter was performed. Exchange length Transcend wire was then placed  through the microcatheter, it and a rapid transit was removed over the wire. Balloon angioplasty was then performed at the left ICA occlusion with a 4mm x 30mm Via-Trak rapid exchange angioplasty balloon. Given the recalcitrant stenosis, a 5mm x 30mm balloon was selected with balloon angioplasty to 7 atmosphere. The balloon was then removed, and Provue18 catheter was advanced over the Transcend wire. Transcend wire was removed and a synchro soft micro wire was selected. Micro wire was then advanced into the MCA. The micro wire was then carefully advanced through the occluded segment. Microcatheter would not pass beyond the ophthalmic segment, likely secondary to the incomplete support system with the balloon catheter below the ICA origin. The exchange length Transcend wire was then placed through the microcatheter, and the microcatheter removed. An intermediate CAT 6 was selected, placed over the micro wire into the distal common carotid artery. The Provue 18 catheter was then advanced through the intermediate catheter while observing the distal aspect of the wire. The combination of the microcatheter and the intermediate catheter were successful in navigating to the skullbase through the persisting stenosis at the right ICA origin. Single attempt a passing the balloon catheter into the ICA was unsuccessful. Tip of the balloon catheter remained in the common carotid artery. The extra support from the intermediate catheter was useful in navigating the micro wire and microcatheter through the occlusion of the MCA into parietal branch. Micro wire was then removed. Blood  was then aspirated through the hub of the microcatheter, and a gentle contrast injection was performed confirming intraluminal position. A rotating hemostatic valve was then attached to the back end of the microcatheter, and a pressurized and heparinized saline bag was attached to the catheter. 4 x 40 solitaire device was then selected. Back flush was  achieved at the rotating hemostatic valve, and then the device was gently advanced through the microcatheter to the distal end. The retriever was then unsheathed by withdrawing the microcatheter under fluoroscopy. Once the retriever was completely unsheathed, control angiogram was performed from the intermediate catheter. 3 minutes of the stent intercolation. Intermediate catheter was then advanced through the ophthalmic segment into the proximal MCA at the proximal aspect of the solitaire. Constant aspiration was then performed at the tip of the intermediate catheter as the retriever was gently and slowly withdrawn with fluoroscopic observation. Once the retriever was entirely removed from the system, free aspiration was confirmed at the hub of the intermediate catheter, with free blood return confirmed. Significant thrombus was retrieved from the solitaire device. Repeat angiogram was then performed through the intermediate catheter confirming restoration of TICI3 flow. The intermediate catheter was then withdrawn into the cervical ICA, in preparation to address the left ICA stenosis with anticipated stent placement. Exchange length Transcend wire was placed through the intermediate catheter which was then removed. Once the exchange length trans and wire was in place, a control angiogram was performed, and there was attempted placement of our selected 8-6 x 40mm Exact stent. The stent would not pass through the lesion at the proximal common carotid artery. Stent was removed from the wire and repeat balloon angioplasty with 5 mm balloon was performed. Again, the stent would not pass through the lesion. Stent was removed from the wire, and a combination of 018 SV8 wire and the rapid transit were used to place the 018 buddy wire adjacent to the 0.014 Transcend wire. This was successful in straightening the carotid tortuosity, however, when there was attempt made to pass the stent system with 2 wire in place, the  lumen on the flow gate catheter would not accept the buddy wire. The stent system was then removed, and a final balloon angioplasty was performed with 5 mm diameter balloon and the 018 SV 8 wire has a modified cutting balloon technique. Eight atmospheric was achieved (nominal diameter). Final attempt at passing the stent across the lesion was unsuccessful, and attempted stenting was abandoned after discussion with Dr. Amada Jupiter of Stroke Neurology. Final control angiogram of the left carotid system was performed. Control angiogram was performed at the right common femoral artery puncture site, after the 55 centimeter 8 French sheath was exchanged for a standard 9 French sheath at the common femoral artery puncture site. Patient tolerated the procedure well and remained hemodynamically stable throughout. No complications were encountered. Estimated blood loss approximately 150 cc. FINDINGS: Ultrasound survey of the left common femoral artery demonstrates wide patency. Ultrasound survey of the right common femoral artery demonstrates wide patency. Initial images: Right common carotid artery:  Normal course caliber and contour. Right external carotid artery: Patent with antegrade flow. Right internal carotid artery: Significant stenosis at the proximal right internal carotid artery with a string sign. The percent stenosis estimated greater than 73% fine ask at criteria, however, this is favored to be greater given there is decreased caliber of the distal ICA secondary to decreased flow. Right MCA: M1 segment patent. The arterial, capillary/ parenchymal, and venous phase are delayed given  the significant stenosis proximally. Contralateral filling of the left anterior cerebral artery with patent anterior communicating artery and large right A1 segment. Right ACA: Large right A1 segment with perfusion of left and right ACA territory. Left common carotid artery: Left common carotid artery origin is a common or drain from  the base of the innominate artery. Mild plaque at the origin. Unremarkable course caliber and contour of the common carotid artery. Left external carotid artery: Patent with antegrade flow. Left internal carotid artery: Advanced calcified and soft plaque at the carotid bifurcation and the left internal carotid artery with occlusion at the initiation of the case. Distal segment is patent, which was proven after crossing of the proximal occlusion. Left MCA: M1 occlusion secondary to thrombus was confirmed on the initial angiogram. Left ACA: Occlusion extended into the left A1 segment, compatible with a T occlusion Final images: After successful thrombectomy, there is restoration of TICI3 flow of the left hemisphere. Restoration of flow through the left A1 segment. Residual high-grade stenosis at the left ICA origin which was recalcitrant to multiple balloon angioplasty up to 5 mm with a cutting balloon technique. Dense plaque at the left ICA origin as well as the geometry of the ICA origin precluded placement of the selected stent. Final image demonstrates flow through the left ICA and left MCA territory PROCEDURE: Cervical cerebral angiogram with left MCA thrombectomy with a combined aspiration and solitaire technique. 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 Restoration of TICI 3 flow was achieved with 1 pass of solitaire device and local aspiration, and flow through the left ICA occlusion was re-established by balloon angioplasty. Dense calcified plaque and the geometry of the proximal left ICA precluded placement of the selected Exact stent, and the patient will remain on medical therapy. Angiogram of the right carotid system demonstrates high-grade stenosis of proximal right ICA secondary to advanced atherosclerotic changes. Although the angiographic measurement of the  string sign is 73% stenosis by NASCET criteria, the degree of stenosis is favored to be greater, as the distal vessel is compromised in diameter given the low flow. Signed, Yvone Neu. Loreta Ave DO [facility] Radiology PLAN: Noncontrast head CT postprocedure. ICU admission. Continue anti-platelet medication. Right common femoral 9 French sheath maintained which will be removed after 12 hours. Left common femoral 4 French sheath maintained for hemodynamic monitoring. Electronically Signed   By: Gilmer Mor D.O.   On: 02/16/2017 11:18     Assessment/Plan : Acute CBA, S/P TPA for  left M1 occlusion. S/P  percutaneous arterial thrombectomy of left A1. S/P balloon angioplasty of left ICA and failed stenting of left ICA. - Neurology primary - Vascular has been consulted.  - plan for MRI today.    Acute hypoxemic respiratory failure 2/2 unable to protect airway 2/2 CVA.  Concern for pulm edema  - vent support - plan for SBT after neuro procedures - need to ask neuro if we can d/c IVF as CXR shows congestion and I am not sure what his EF is.  - f/u echo - may need diuresis - cont intubated and sedated for now - start TF if not extubated today   I spent  30   minutes of Critical Care time with this patient today. This is my time spent independent of the APP or resident.   Family  No family at bedside.  Pollie Meyer, MD 02/16/2017, 11:14 AM [place] [facility] Pager [phone] After 3 pm or if no answer, call [phone]

## 2017-02-16 NOTE — H&P (Addendum)
Neurology Consultation Reason for Consult: Right-sided weakness Referring Physician: Palumbo, A  CC: Right-sided weakness  History is obtained from: Patient  HPI: Erik [name] is a 61 y.o. male with a history of hypertension who was last in his normal state of health around 11:15 PM. He had an episode earlier where he felt like his legs might give out on him, but this completely resolved and he came back completely to normal. They went to a baseball game and were climbing bleachers and his wife noticed that he didn't have any issues. Around 11:15 PM, his wife went upstairs take a bath and then when she came out she found he was on the ground unable to speak.  He was brought in as a code stroke, and initially there was some delay with IV TPA due to not being able to get full history, but risks and benefits were discussed with his wife then treatment was undertaken.  CTA was performed which demonstrated left M1 occlusion and therefore Dr. Loreta Ave with interventional radiology was consulted.  Prior to starting IV TPA, he did have some improvement and began following some commands, but still had a disabling deficit and therefore tPA was administered.  LKW: 11:15 PM tpa given?: Yes    ROS:  Unable to obtain due to altered mental status.   Past Medical History:  Diagnosis Date  . Anxiety   . BPH (benign prostatic hyperplasia)   . ED (erectile dysfunction)   . H/O calcium pyrophosphate deposition disease (CPPD)   . HLD (hyperlipidemia)   . Hypogonadism in male   . Lower urinary tract symptoms (LUTS)   . Nocturia   . Testosterone overdose   . Vitamin D deficiency     Family history: Mother-possible history of stroke   Social History:  reports that he has never smoked. He does not have any smokeless tobacco history on file. He reports that he drinks alcohol. His drug history is not on file. He drinks approximately 2 drinks per day.  Exam: Current vital signs: BP (!) 154/75    Pulse 78   Resp (!) 22   Wt 99.8 kg (220 lb 0.3 oz)   SpO2 94%   BMI 31.57 kg/m  Vital signs in last 24 hours: Pulse Rate:  [78-83] 78 (04/13 0200) Resp:  [18-22] 22 (04/13 0200) BP: (154-157)/(72-75) 154/75 (04/13 0200) SpO2:  [94 %-96 %] 94 % (04/13 0200) Weight:  [99.8 kg (220 lb 0.3 oz)] 99.8 kg (220 lb 0.3 oz) (04/13 0138)   Physical Exam  Constitutional: Appears well-developed and well-nourished.  Psych: Affect appropriate to situation Eyes: No scleral injection HENT: No OP obstrucion Head: Normocephalic.  Cardiovascular: Normal rate and regular rhythm.  Respiratory: Effort normal and breath sounds normal to anterior ascultation GI: Soft.  No distension. There is no tenderness.  Skin: WDI  Initial exam: Neuro: Mental Status: Patient is awake, alert, unable to follow commands and without intelligible speech Cranial Nerves: II: Right hemianopia Pupils are equal, round, and reactive to light.   III,IV, VI: Left gaze preference  V: He does respond to touch on face bilaterally VII: Facial movement is notable for severe right facial weakness He does not protrude tongue Motor: He has a severe right hemiparesis, does not respond to noxious stimuli Sensory: He does not respond noxious stimuli nearly as much on the right as left Cerebellar: He does not perform  Subsequently improved to being able to follow commands, able to shake his head yes/no but without ability  to speak more than one word. He continued to have a right hemiparesis, arm >leg.   I have reviewed labs in epic and the results pertinent to this consultation are: CMP, CBC unremarkable  I have reviewed the images obtained: CT angiogram-left M1 occlusion  Impression: 61 year old male with left M1 occlusion. Possible artery to artery embolus from his left carotid stenosis. He has received IV TPA and is currently being taken for angiography.  Recommendations: 1. HgbA1c, fasting lipid panel 2. MRI, MRA  of  the brain without contrast 3. Frequent neuro checks 4. Echocardiogram 5. Carotid dopplers 6. Prophylactic therapy-none for 24 hours 7. Risk factor modification 8. Telemetry monitoring 9. PT consult, OT consult, Speech consult 10. please page stroke NP  Or  PA  Or MD  from 8am -4 pm as this patient will be followed by the stroke team at this point.   You can look them up on [email or web address]    This patient is critically ill and at significant risk of neurological worsening, death and care requires constant monitoring of vital signs, hemodynamics,respiratory and cardiac monitoring, neurological assessment, discussion with family, other specialists and medical decision making of high complexity. I spent 60 minutes of neurocritical care time  in the care of  this patient.  Ritta Slot, MD [facility] [phone]  If 7pm- 7am, please page neurology on call as listed in AMION. 02/16/2017  2:19 AM

## 2017-02-16 NOTE — Progress Notes (Signed)
Neuro-Interventional Radiology Pre-procedure Note  61 yo male presents to the ED with acute left hemisphere syndrome.  Last known well within 4.5 hours, with last time at 11:30pm.  Discovered by his wife at home.   Baseline mRS is 0.   Received tPA in the ED scanner.   Right sided hemiplegia, upper and lower, with speech difficulty, right mouth droop.    NIHSS >10.  Noncontrast CT = ASPECTS 10.    CTA neck:  Tandem occlusion with left ICA occlusion at the bifurcation, as well as left M1 occlusion.  Background of carotid calcification.   I do not see need for CT perfusion.   Discussed the situation with his wife.  Tandem occlusions have poor prognosis, with best chance of improving outcome mechanical thrombectomy and revascularization of the acute ICA occlusion and M1 occlusion.  Angiogram, mechanical thrombectomy, possible angioplasty/stenting discussed with his wife.  Risks include: Bleeding, infection, renal injury, arterial injury, need for further procedure, contrast reaction, neurologic deficit, intracranial hemorrhage of 10-15%, cardiopulmonary collapse, death.    She agrees and would like to proceed with treatment plan of angiogram and possible intervention.   Signed,  Yvone Neu. Loreta Ave, DO

## 2017-02-16 NOTE — Anesthesia Preprocedure Evaluation (Signed)
Anesthesia Evaluation  Patient identified by MRN, date of birth, ID band Patient confused    Reviewed: Allergy & Precautions, Patient's Chart, lab work & pertinent test resultsPreop documentation limited or incomplete due to emergent nature of procedure.  Airway Mallampati: II   Neck ROM: full  Mouth opening: Limited Mouth Opening  Dental   Pulmonary neg pulmonary ROS,    breath sounds clear to auscultation       Cardiovascular hypertension,  Rhythm:regular Rate:Normal     Neuro/Psych PSYCHIATRIC DISORDERS Anxiety Depression CVA    GI/Hepatic GERD  ,  Endo/Other    Renal/GU      Musculoskeletal  (+) Arthritis ,   Abdominal   Peds  Hematology   Anesthesia Other Findings   Reproductive/Obstetrics                             Anesthesia Physical Anesthesia Plan  ASA: III and emergent  Anesthesia Plan: General   Post-op Pain Management:    Induction: Intravenous, Rapid sequence and Cricoid pressure planned  Airway Management Planned: Oral ETT  Additional Equipment: Arterial line  Intra-op Plan:   Post-operative Plan: Possible Post-op intubation/ventilation  Informed Consent: I have reviewed the patients History and Physical, chart, labs and discussed the procedure including the risks, benefits and alternatives for the proposed anesthesia with the patient or authorized representative who has indicated his/her understanding and acceptance.     Plan Discussed with: CRNA, Anesthesiologist and Surgeon  Anesthesia Plan Comments:         Anesthesia Quick Evaluation

## 2017-02-16 NOTE — ED Notes (Signed)
TPA started @ 0111.  bolus. Current rate .

## 2017-02-17 ENCOUNTER — Inpatient Hospital Stay (HOSPITAL_COMMUNITY): Payer: Managed Care, Other (non HMO)

## 2017-02-17 ENCOUNTER — Encounter (HOSPITAL_COMMUNITY): Payer: Self-pay | Admitting: *Deleted

## 2017-02-17 DIAGNOSIS — Z9911 Dependence on respirator [ventilator] status: Secondary | ICD-10-CM

## 2017-02-17 DIAGNOSIS — I6522 Occlusion and stenosis of left carotid artery: Secondary | ICD-10-CM

## 2017-02-17 DIAGNOSIS — E1159 Type 2 diabetes mellitus with other circulatory complications: Secondary | ICD-10-CM

## 2017-02-17 DIAGNOSIS — I639 Cerebral infarction, unspecified: Secondary | ICD-10-CM

## 2017-02-17 LAB — CBC
HCT: 32 % — ABNORMAL LOW (ref 39.0–52.0)
Hemoglobin: 10.5 g/dL — ABNORMAL LOW (ref 13.0–17.0)
MCH: 30.3 pg (ref 26.0–34.0)
MCHC: 32.8 g/dL (ref 30.0–36.0)
MCV: 92.2 fL (ref 78.0–100.0)
PLATELETS: 140 10*3/uL — AB (ref 150–400)
RBC: 3.47 MIL/uL — ABNORMAL LOW (ref 4.22–5.81)
RDW: 13.1 % (ref 11.5–15.5)
WBC: 7 10*3/uL (ref 4.0–10.5)

## 2017-02-17 LAB — BLOOD GAS, ARTERIAL
Acid-Base Excess: 2.2 mmol/L — ABNORMAL HIGH (ref 0.0–2.0)
Bicarbonate: 26.3 mmol/L (ref 20.0–28.0)
FIO2: 40
O2 SAT: 97.9 %
PATIENT TEMPERATURE: 98.6
PEEP: 5 cmH2O
RATE: 15 resp/min
VT: 400 mL
pCO2 arterial: 41.5 mmHg (ref 32.0–48.0)
pH, Arterial: 7.419 (ref 7.350–7.450)
pO2, Arterial: 103 mmHg (ref 83.0–108.0)

## 2017-02-17 LAB — GLUCOSE, CAPILLARY
GLUCOSE-CAPILLARY: 129 mg/dL — AB (ref 65–99)
GLUCOSE-CAPILLARY: 118 mg/dL — AB (ref 65–99)
GLUCOSE-CAPILLARY: 124 mg/dL — AB (ref 65–99)
GLUCOSE-CAPILLARY: 133 mg/dL — AB (ref 65–99)
Glucose-Capillary: 190 mg/dL — ABNORMAL HIGH (ref 65–99)
Glucose-Capillary: 133 mg/dL — ABNORMAL HIGH (ref 65–99)
Glucose-Capillary: 121 mg/dL — ABNORMAL HIGH (ref 65–99)

## 2017-02-17 LAB — HEMOGLOBIN A1C
Hgb A1c MFr Bld: 7 % — ABNORMAL HIGH (ref 4.8–5.6)
MEAN PLASMA GLUCOSE: 154 mg/dL

## 2017-02-17 LAB — BASIC METABOLIC PANEL
ANION GAP: 9 (ref 5–15)
BUN: 8 mg/dL (ref 6–20)
CO2: 26 mmol/L (ref 22–32)
Calcium: 8.9 mg/dL (ref 8.9–10.3)
Chloride: 105 mmol/L (ref 101–111)
Creatinine, Ser: 0.71 mg/dL (ref 0.61–1.24)
GFR calc Af Amer: >60 mL/min (ref >60)
GLUCOSE: 107 mg/dL — AB (ref 65–99)
Potassium: 3.6 mmol/L (ref 3.5–5.1)
Sodium: 140 mmol/L (ref 135–145)

## 2017-02-17 LAB — MAGNESIUM: Magnesium: 2 mg/dL (ref 1.7–2.4)

## 2017-02-17 LAB — PHOSPHORUS: Phosphorus: 3.2 mg/dL (ref 2.5–4.6)

## 2017-02-17 MED ORDER — ASPIRIN 300 MG RE SUPP: 300.0000 mg | Freq: Every day | RECTAL | Status: DC

## 2017-02-17 MED ORDER — HEPARIN SODIUM (PORCINE) 5000 UNIT/ML IJ SOLN
5000.0000 [IU] | Freq: Three times a day (TID) | INTRAMUSCULAR | Status: DC
  Administered 2017-02-17 – 2017-02-18 (×4): 5000 [IU] via SUBCUTANEOUS
  Filled 2017-02-17 (×4): qty 1

## 2017-02-17 MED ORDER — INSULIN ASPART 100 UNIT/ML ~~LOC~~ SOLN
0.0000 [IU] | SUBCUTANEOUS | Status: DC
  Administered 2017-02-17 (×2): 2 [IU] via SUBCUTANEOUS
  Administered 2017-02-17: 3 [IU] via SUBCUTANEOUS
  Administered 2017-02-17 – 2017-02-18 (×3): 2 [IU] via SUBCUTANEOUS
  Administered 2017-02-18: 3 [IU] via SUBCUTANEOUS

## 2017-02-17 MED ORDER — ASPIRIN EC 325 MG PO TBEC
325.0000 mg | DELAYED_RELEASE_TABLET | Freq: Every day | ORAL | Status: DC
  Administered 2017-02-17 – 2017-02-18 (×2): 325 mg via ORAL
  Filled 2017-02-17 (×2): qty 1

## 2017-02-17 NOTE — Progress Notes (Signed)
PT Cancellation Note  Patient Details Name: ALFARD COCHRANE MRN: [id number] DOB: 11/30/1955   Cancelled Treatment:    Reason Eval/Treat Not Completed: Other (comment).  Pt remains on bedrest.  PT to check back after neuro rounds.  Thanks,    Rollene Rotunda. Kishia Shackett, PT, DPT [phone]   02/17/2017, 8:58 AM

## 2017-02-17 NOTE — Evaluation (Signed)
Physical Therapy Evaluation Patient Details Name: Erik [name] MRN: [id number] DOB: 1956-10-09 Today's Date: 02/17/2017   History of Present Illness  61 y.o. 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  Bil high grade ICA stenosis.  Pt with significant PMhx of HLD, bil knee surgery, and R RTC repair.  Clinical Impression  Pt was able to get up OOB to the recliner chair this AM with mod one person assist.  He has weak, but present R arm strength and weak right leg strength.  He would likely be able to progress to assisted gait soon, but would be safer attempting with two people.  There is some mild left gaze preference, so I positioned his wife on his right after transferring him to the chair.  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  I think    PT to follow acutely for deficits listed below.       Follow Up Recommendations CIR    Equipment Recommendations  Rolling walker with 5" wheels;3in1 (PT);Wheelchair (measurements PT);Wheelchair cushion (measurements PT)    Recommendations for Other Services Rehab consult     Precautions / Restrictions Precautions Precautions: Fall Precaution Comments: due to right sided weakness Restrictions Weight Bearing Restrictions: No      Mobility  Bed Mobility Overal bed mobility: Needs Assistance Bed Mobility: Supine to Sit     Supine to sit: HOB elevated;Min assist     General bed mobility comments: Min assist to help progress right leg and hip to EOB.  Pt going to his left side so he can use his left arm to help push his trunk up.    Transfers Overall transfer level: Needs assistance Equipment used: None Transfers: Sit to/from [employer] Sit to Stand: Mod assist;From elevated surface Stand pivot transfers: Mod assist;From elevated surface       General transfer comment: Mod assist to support trunk and stabilize his right leg in  standing.  Hyperextended right knee and foot sliding out from under him in standing.  Knee and foot had to be blocked to prevent LOB.  Pt was able to take 1-2 small steps and use his left arm to reach for the far side of the recliner chair on his left side to get up.  He used hip extension to bring right leg around.   Ambulation/Gait             General Gait Details: will need two people to be safe at attempts with gait, but I think he will be ready on his next session.            Modified Rankin (Stroke Patients Only) Modified Rankin (Stroke Patients Only) Pre-Morbid Rankin Score: No symptoms Modified Rankin: Moderately severe disability     Balance Overall balance assessment: Needs assistance Sitting-balance support: Feet supported;Single extremity supported Sitting balance-Leahy Scale: Good Sitting balance - Comments: supervision EOB.     Standing balance support: Single extremity supported Standing balance-Leahy Scale: Poor Standing balance comment: needs mod assist to stabilize his right leg in standing.                              Pertinent Vitals/Pain Pain Assessment: No/denies pain    Home Living Family/patient expects to be discharged to:: Private residence Living Arrangements: Spouse/significant other;Children;Other relatives Available Help at Discharge: Family Type of  Home: House Home Access: Stairs to enter Entrance Stairs-Rails: Lawyer of Steps: 6 Home Layout: Two level;Bed/bath upstairs;Full bath on main level Home Equipment: None      Prior Function Level of Independence: Independent         Comments: drives, works full time in a Teacher, adult education   Dominant Hand: Right    Extremity/Trunk Assessment   Upper Extremity Assessment Upper Extremity Assessment: Defer to OT evaluation    Lower Extremity Assessment Lower Extremity Assessment: RLE deficits/detail RLE Deficits / Details:  right leg with at least 3/5 ankle PF/DF, 3/5 knee ext, 3/5 hip flexion.  Sensation intact to LT RLE Sensation:  (intact to LT) RLE Coordination: decreased gross motor;decreased fine motor    Cervical / Trunk Assessment Cervical / Trunk Assessment: Normal  Communication   Communication: Expressive difficulties;Other (comment) (was just extubated)  Cognition Arousal/Alertness: Awake/alert Behavior During Therapy: WFL for tasks assessed/performed Overall Cognitive Status: Impaired/Different from baseline Area of Impairment: Problem solving;Following commands                       Following Commands: Follows one step commands with increased time     Problem Solving: Slow processing General Comments: Pt is slow to answer questions and at time innacurate with his answers.       General Comments General comments (skin integrity, edema, etc.): Some left gaze preference, so I positioned his wife on his right side after the transfer to encourage him to look to the right.         Assessment/Plan    PT Assessment Patient needs continued PT services  PT Problem List Decreased strength;Decreased range of motion;Decreased activity tolerance;Decreased balance;Decreased mobility;Decreased coordination;Decreased knowledge of use of DME;Decreased cognition       PT Treatment Interventions DME instruction;Gait training;Stair training;Functional mobility training;Therapeutic activities;Therapeutic exercise;Balance training;Neuromuscular re-education;Cognitive remediation;Patient/family education;Wheelchair mobility training    PT Goals (Current goals can be found in the Care Plan section)  Acute Rehab PT Goals Patient Stated Goal: wife wants him to get back to his normal self.  PT Goal Formulation: With patient/family Time For Goal Achievement: 03/03/17 Potential to Achieve Goals: Good    Frequency Min 4X/week   Barriers to discharge Inaccessible home environment two level home,  his bedroom is upstairs.         End of Session Equipment Utilized During Treatment: Gait belt;Oxygen (used O2 as he was just extubated this AM. ) Activity Tolerance: Patient tolerated treatment well Patient left: in chair;with call bell/phone within reach;with family/visitor present;with chair alarm set Nurse Communication: Mobility status PT Visit Diagnosis: Hemiplegia and hemiparesis;Difficulty in walking, not elsewhere classified (R26.2);Muscle weakness (generalized) (M62.81) Hemiplegia - Right/Left: Right Hemiplegia - dominant/non-dominant: Dominant Hemiplegia - caused by: Cerebral infarction    Time: 1610-9604 PT Time Calculation (min) (ACUTE ONLY): 36 min   Charges:   PT Evaluation $PT Eval Moderate Complexity: 1 Procedure PT Treatments $Therapeutic Activity: 8-22 mins          Trinidad Ingle B. Braelynn Benning, PT, DPT [phone]   02/17/2017, 11:24 AM

## 2017-02-17 NOTE — Progress Notes (Signed)
VASCULAR LAB PRELIMINARY  PRELIMINARY  PRELIMINARY  PRELIMINARY  Carotid duplex completed.    Preliminary report:  There is 80-99% right ICA stenosis, highest end of scale.  The left ICA appears occluded.  Vertebral artery flow is antegrade.   Necole Minassian, RVT 02/17/2017, 5:25 PM

## 2017-02-17 NOTE — Progress Notes (Signed)
Name: Erik [name] MRN: [id number] DOB: June 18, 1956    LOS: 1  PCCM f/u NOTE  History of Present Illness: [name] is a 61yo male with PMH of HTN and HLD who presented to [facility] on 4/12 with dysarthria and weakness Code stroke was called; patient received TPA at 11:15pm and CTA showed a left M1 occlusion. Per neuro notes, it is a possible embolus from left carotid stenosis. IR was consulted and performed a percutaneous arterial thrombectomy of left A1; balloon angioplasty of left ICA and failed stenting of left ICA.  PCCM was consulted for vent management post procedure.  Lines / Drains: A line 4/13 Foley 4/13  Cultures: none  Antibiotics: none  Tests / Events: 4/12 - presented to [facility] with dysarthria and right sided weakness - L MCA stroke; s/p IV TPA and percutaneous thrombectomy. 4/12 - remained on vent post procedure 4/14 MRI brain: 1. Multiple areas of acute or early subacute ischemia within the supratentorial left hemisphere, with the largest areas located in the left basal ganglia and left temporal lobe. There is associated cytotoxic edema without midline shift herniation or other significant mass effect. 2. Petechial hemorrhage in the posterior left temporal lobe and peripheral left basal ganglia without space-occupying hematoma. 3. No flow related enhancement within the visualized left internal carotid artery and diminished enhancement in the right internal carotid artery. This may be due to slow flow, though recurrent occlusion of the treated left internal carotid artery would be difficult to exclude. CTA of the neck may be helpful to assess possible residual or recurrent occlusion/stenosis. 4. Normal flow related enhancement of the left middle cerebral artery.  Subjective No distress.  Weaning on SBT Objective Vital Signs: Temp:  [97.5 F (36.4 C)-99.4 F (37.4 C)] 99.4 F (37.4 C) (04/14 0800) Pulse Rate:  [55-71] 66 (04/14 0800) Resp:  [10-17] 13 (04/14 0800) BP:  (117-155)/(64-82) 155/75 (04/14 0800) SpO2:  [98 %-100 %] 100 % (04/14 0800) Arterial Line BP: (156-191)/(61-71) 191/69 (04/13 1500) FiO2 (%):  [40 %] 40 % (04/14 0800) Weight:  [221 lb 1.9 oz (100.3 kg)] 221 lb 1.9 oz (100.3 kg) (04/14 0200)  Intake/Output Summary (Last 24 hours) at 02/17/17 0909 Last data filed at 02/17/17 0900  Gross per 24 hour  Intake          1726.09 ml  Output             3135 ml  Net         -1408.91 ml     Physical Examination: General appearance:  61 Year old  Male, well nourished  NAD,conversant  Eyes: anicteric sclerae, moist conjunctivae; PERRL, EOMI bilaterally. Mouth:  membranes and no mucosal ulcerations; normal hard and soft palate Neck: Trachea midline; neck supple, no JVD Lungs/chest: CTA, with normal respiratory effort and no intercostal retractions CV: RRR, no MRGs  Abdomen: Soft, non-tender; no masses or HSM Extremities: No peripheral edema or extremity lymphadenopathy Skin: Normal temperature, turgor and texture; no rash, ulcers or subcutaneous nodules Neuro/Psych: Appropriate affect, alert follows commands. Has right sided weakness  CBC Recent Labs     02/16/17  0038  02/16/17  0050  02/17/17  0233  WBC  6.6   --   7.0  HGB  13.2  13.3  10.5*  HCT  38.2*  39.0  32.0*  PLT  207   --   140*    Coag's Recent Labs     02/16/17  0038  APTT  22*  INR  0.97    BMET Recent Labs     02/16/17  0038  02/16/17  0050  02/17/17  0233  NA  136  138  140  K  3.7  3.6  3.6  CL  102  101  105  CO2  23   --   26  BUN  22*  24*  8  CREATININE  0.97  1.00  0.71  GLUCOSE  206*  207*  107*    Electrolytes Recent Labs     02/16/17  0038  02/17/17  0233  CALCIUM  9.3  8.9  MG   --   2.0  PHOS   --   3.2    Sepsis Markers No results for input(s): PROCALCITON, O2SATVEN in the last 72 hours.  Invalid input(s): LACTICACIDVEN  ABG Recent Labs     02/16/17  0807  02/17/17  0530  PHART  7.346*  7.419  PCO2ART  47.2  41.5   PO2ART  161.0*  103    Liver Enzymes Recent Labs     02/16/17  0038  AST  30  ALT  29  ALKPHOS  33*  BILITOT  0.6  ALBUMIN  4.4    Cardiac Enzymes No results for input(s): TROPONINI, PROBNP in the last 72 hours.  Glucose Recent Labs     02/16/17  0056  02/16/17  1253  02/16/17  1559  02/16/17  1959  02/17/17  0321  02/17/17  0738  GLUCAP  238*  160*  138*  110*  118*  124*    PCXR  Improved aeration. Right basilar atx  Tubes good position   Assessment and Plan:  Left MCA s/p TPA and thrombectomy w/ revascularization  Plan Antiplatelet therapy per neuro PT/OT/SLP service  Severe bilateral carotid artery stenosis  Plan Will need vasc surgery consult after extubation (defer to stroke team) Keep MAP >70   Ventilatory support in setting of CVA right base atx -passed SBT -f/c  -cxr improved Plan Extubate Wean O2 Mobilize  IS  NPO pending swallow eval   DM w/ mild hyperglycemia  Plan ssi   Anemia w/out evidence of bleeding Plan Trend CBC  DVT prophylaxis: SCDs SUP: ppi  Diet: npo SLP eval after extubation.  Activity: adv activity. Needs PT eval after extubation  Disposition : ICU   Summary  Vent dep s/p CVA and thrombectomy/revacularization in IR. Passed SBT. Ready for extubation. Will get PT, SLP and OT consult after extubation. Allowing for adequate MAP will be key s/p extubation as has bilateral carotid artery stenosis.   My critical care time 35 minutes  Simonne Martinet ACNP-BC [facility] Pulmonary/Critical Care Pager # [phone] OR # [phone] if no answer    02/17/2017, 8:42 AM

## 2017-02-17 NOTE — Evaluation (Signed)
Clinical/Bedside Swallow Evaluation Patient Details  Name: Erik [name] MRN: [id number] Date of Birth: [birth year]  Today's Date: 02/17/2017 Time: SLP Start Time (ACUTE ONLY): 1432 SLP Stop Time (ACUTE ONLY): 1456 SLP Time Calculation (min) (ACUTE ONLY): 24 min  Past Medical History:  Past Medical History:  Diagnosis Date  . Anxiety   . BPH (benign prostatic hyperplasia)   . ED (erectile dysfunction)   . H/O calcium pyrophosphate deposition disease (CPPD)   . HLD (hyperlipidemia)   . Hypogonadism in male   . Lower urinary tract symptoms (LUTS)   . Nocturia   . Testosterone overdose   . Vitamin D deficiency    Past Surgical History:  Past Surgical History:  Procedure Laterality Date  . IR ANGIO INTRA EXTRACRAN SEL COM CAROTID INNOMINATE UNI R MOD SED  02/16/2017  . IR PERCUTANEOUS ART THROMBECTOMY/INFUSION INTRACRANIAL INC DIAG ANGIO  02/16/2017  . IR PTA NON CORO-LOWER EXTREM  02/16/2017  . IR US GUIDE VASC ACCESS LEFT  02/16/2017  . IR US GUIDE VASC ACCESS RIGHT  02/16/2017  . KNEE ARTHROSCOPY Bilateral    HPI:  PT is a 61 y.o. 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 Bil high grade ICA stenosis. Pt with significant PMhx of HLD, bil knee surgery, and R RTC repair. Pt intubated 4-13 with extubation this date.    Assessment / Plan / Recommendation Clinical Impression  Pt presents with mild oropharyngeal dysphagia of neurogenic etiology s/p left MCA CVA with thrombectomy. Right sided oral motor deficits apparent including right facial droop, decreased right facial sensation, and decreased right lingual ROM/strength. Deficits result in delayed oral transit and intermittent right anterior spillage. Vocal quality with reduced vocal intensity s/p 1 day intubation with extubation this am. Pt does exhibit dysarthria and aphasia. Cognitive linguisitc evaluation to be completed. Pt with delayed throat clear following thin liquids however vocal  quality remained clear. Educated spouse and pt regarding swallow precautions. Recommend regular thin liquid diet with full supervision to ensure swallow safety/aid in feeding secondary to residual right sided paresis. ST to follow up for diet tolerance and cognitive linguistic evaluation.      SLP Visit Diagnosis: Dysphagia, oropharyngeal phase (R13.12)    Aspiration Risk  Mild aspiration risk;Moderate aspiration risk    Diet Recommendation   Regular , thin liquids   Medication Administration: Whole meds with puree    Other  Recommendations Oral Care Recommendations: Oral care BID   Follow up Recommendations Inpatient Rehab      Frequency and Duration min 2x/week  1 week       Prognosis Prognosis for Safe Diet Advancement: Good      Swallow Study   General Date of Onset: 02/16/17 HPI: PT is a 61 y.o. 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 Bil high grade ICA stenosis. Pt with significant PMhx of HLD, bil knee surgery, and R RTC repair. Pt intubated 4-13 with extubation this date.  Type of Study: Bedside Swallow Evaluation Previous Swallow Assessment: none on file Diet Prior to this Study: NPO Temperature Spikes Noted: No Respiratory Status: Nasal cannula History of Recent Intubation: Yes Length of Intubations (days): 1 days Date extubated: 02/17/17 Behavior/Cognition: Cooperative;Pleasant mood Oral Cavity Assessment: Within Functional Limits Oral Care Completed by SLP: No Oral Cavity - Dentition: Missing dentition Self-Feeding Abilities: Needs assist Patient Positioning: Upright in chair Baseline Vocal Quality: Low vocal intensity Volitional Cough: Weak Volitional Swallow:  Able to elicit    Oral/Motor/Sensory Function Overall Oral Motor/Sensory Function: Moderate impairment Facial ROM: Reduced right;Suspected CN VII (facial) dysfunction Facial Symmetry: Abnormal symmetry right;Suspected CN VII (facial) dysfunction Facial  Strength: Reduced right;Suspected CN VII (facial) dysfunction Facial Sensation: Reduced right Lingual ROM: Reduced right Lingual Symmetry: Abnormal symmetry right;Suspected CN XII (hypoglossal) dysfunction   Ice Chips Ice chips: Not tested   Thin Liquid Thin Liquid: Impaired Presentation: Cup;Straw Oral Phase Impairments: Reduced labial seal Oral Phase Functional Implications: Prolonged oral transit;Right anterior spillage Pharyngeal  Phase Impairments: Suspected delayed Swallow;Multiple swallows;Throat Clearing - Delayed    Nectar Thick Nectar Thick Liquid: Not tested   Honey Thick Honey Thick Liquid: Not tested   Puree Puree: Within functional limits   Solid   GO   Solid: Impaired Oral Phase Impairments: Impaired mastication Oral Phase Functional Implications: Prolonged oral transit;Impaired mastication       Erik Duos MA, CCC-SLP Acute Care Speech Language Pathologist    Erik [name] 02/17/2017,3:04 PM

## 2017-02-17 NOTE — Progress Notes (Signed)
Rehab Admissions Coordinator Note:  Patient was screened by Erik [name] for appropriateness for an Inpatient Acute Rehab Consult per PT recommendation.  At this time, we are recommending Inpatient Rehab consult.  Erik [name] 02/17/2017, 3:33 PM  I can be reached at [phone].

## 2017-02-17 NOTE — Progress Notes (Signed)
STROKE TEAM PROGRESS NOTE   SUBJECTIVE (INTERVAL HISTORY) His RN is at the bedside.  Still intubated on low dose sedation but able to follow commands and open eyes. On weaning protocol, tolerating well. [name] to extubate today. Sheath and aline removed. On albumin last dose. BP on the goal. Had MRI and MRA showed left MCA patchy infarct, left ICA not visualized but reconstituted at cavernous segment.     OBJECTIVE Temp:  [97.5 F (36.4 C)-99.4 F (37.4 C)] 99.4 F (37.4 C) (04/14 0800) Pulse Rate:  [55-71] 66 (04/14 0800) Cardiac Rhythm: Normal sinus rhythm (04/14 0800) Resp:  [10-17] 13 (04/14 0800) BP: (117-155)/(64-82) 155/75 (04/14 0800) SpO2:  [98 %-100 %] 100 % (04/14 0800) Arterial Line BP: (156-191)/(61-71) 191/69 (04/13 1500) FiO2 (%):  [40 %] 40 % (04/14 0800) Weight:  [221 lb 1.9 oz (100.3 kg)] 221 lb 1.9 oz (100.3 kg) (04/14 0200)   Recent Labs Lab 02/16/17 1253 02/16/17 1559 02/16/17 1959 02/17/17 0321 02/17/17 0738  GLUCAP 160* 138* 110* 118* 124*    Recent Labs Lab 02/16/17 0038 02/16/17 0050 02/17/17 0233  NA 136 138 140  K 3.7 3.6 3.6  CL 102 101 105  CO2 23  --  26  GLUCOSE 206* 207* 107*  BUN 22* 24* 8  CREATININE 0.97 1.00 0.71  CALCIUM 9.3  --  8.9  MG  --   --  2.0  PHOS  --   --  3.2    Recent Labs Lab 02/16/17 0038  AST 30  ALT 29  ALKPHOS 33*  BILITOT 0.6  PROT 6.9  ALBUMIN 4.4    Recent Labs Lab 02/16/17 0038 02/16/17 0050 02/17/17 0233  WBC 6.6  --  7.0  NEUTROABS 3.3  --   --   HGB 13.2 13.3 10.5*  HCT 38.2* 39.0 32.0*  MCV 91.0  --  92.2  PLT 207  --  140*   No results for input(s): CKTOTAL, CKMB, CKMBINDEX, TROPONINI in the last 168 hours.  Recent Labs  02/16/17 0038  LABPROT 12.9  INR 0.97   No results for input(s): COLORURINE, LABSPEC, PHURINE, GLUCOSEU, HGBUR, BILIRUBINUR, KETONESUR, PROTEINUR, UROBILINOGEN, NITRITE, LEUKOCYTESUR in the last 72 hours.  Invalid input(s): APPERANCEUR     Component Value  Date/Time   CHOL 135 02/16/2017 0612   TRIG 144 02/16/2017 0612   HDL 28 (L) 02/16/2017 0612   CHOLHDL 4.8 02/16/2017 0612   VLDL 29 02/16/2017 0612   LDLCALC 78 02/16/2017 0612   Lab Results  Component Value Date   HGBA1C 7.0 (H) 02/16/2017   No results found for: LABOPIA, COCAINSCRNUR, LABBENZ, AMPHETMU, THCU, LABBARB  No results for input(s): ETH in the last 168 hours.  I have personally reviewed the radiological images below and agree with the radiology interpretations.  Ct Angio Head and neck W Or Wo Contrast 02/16/2017 IMPRESSION: 1. 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 Distal collateral flow within the left MCA distribution persists and is relatively symmetric to the right side. 2. 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 3. At least 90% stenosis at the origin of the right internal carotid artery secondary to mixed calcified and noncalcified plaque.   Ct Head Wo Contrast 02/16/2017 IMPRESSION: 1. Status post stroke intervention with resolution of previously seen hyperdense thrombus in the left middle cerebral artery. 2. No acute  hemorrhage or cytotoxic edema.   Mri brain and Mra Head Wo Contrast 02/17/2017 IMPRESSION: 1. Multiple areas of acute or early subacute ischemia within the supratentorial left hemisphere, with the largest areas located in the left basal ganglia and left temporal lobe. There is associated cytotoxic edema without midline shift herniation or other significant mass effect. 2. Petechial hemorrhage in the posterior left temporal lobe and peripheral left basal ganglia without space-occupying hematoma. Short interval follow-up head CT is recommended. 3. No flow related enhancement within the visualized left internal carotid artery and diminished enhancement in the right internal carotid  artery. This may be due to slow flow, though recurrent occlusion of the treated left internal carotid artery would be difficult to exclude. CTA of the neck may be helpful to assess possible residual or recurrent occlusion/stenosis. 4. Normal flow related enhancement of the left middle cerebral artery.   Cerebral angio IMPRESSION: Status post treatment of tandem occlusion of left ICA and MCA with balloon angioplasty of advanced proximal ICA disease and mechanical thrombectomy of acute left M1 occlusion. Restoration of TICI 3 flow was achieved with 1 pass of solitaire device and local aspiration, and flow through the left ICA occlusion was re-established by balloon angioplasty. Dense calcified plaque and the geometry of the proximal left ICA precluded placement of the selected Exact stent, and the patient will remain on medical therapy. Angiogram of the right carotid system demonstrates high-grade stenosis of proximal right ICA secondary to advanced atherosclerotic changes. Although the angiographic measurement of the string sign is 73% stenosis by NASCET criteria, the degree of stenosis is favored to be greater, as the distal vessel is compromised in diameter given the low flow.   Ct Head Code Stroke W/o Cm 02/16/2017 IMPRESSION: 1. Hyper dense left middle cerebral artery, likely indicating the presence of acute thrombus. 2. ASPECTS is 10. 3. No intracranial hemorrhage.   CXR - 1. Stable support apparatus. 2. Improved aeration to the lungs with persistent RIGHT basilar atelectasis.  TTE pending  CUS pending   PHYSICAL EXAM  Temp:  [97.5 F (36.4 C)-99.4 F (37.4 C)] 99.4 F (37.4 C) (04/14 0800) Pulse Rate:  [55-71] 66 (04/14 0800) Resp:  [10-17] 13 (04/14 0800) BP: (117-155)/(64-82) 155/75 (04/14 0800) SpO2:  [98 %-100 %] 100 % (04/14 0800) Arterial Line BP: (156-191)/(61-71) 191/69 (04/13 1500) FiO2 (%):  [40 %] 40 % (04/14 0800) Weight:  [221 lb 1.9 oz (100.3 kg)] 221 lb 1.9 oz (100.3  kg) (04/14 0200)  General - Well nourished, well developed, intubated and on low dose sedation.  Ophthalmologic - fundi not visualized  Cardiovascular - Regular rate and rhythm.  Neuro - intubated on low dose sedation, able to open eyes on voice, following central and peripheral commands. PERRL, EOMI, blinking to visual threat on the left but not on the right. Facial symmetry difficult to test due to ET. LUE and LLE purposeful movement and against gravity, RUE proximal 3-/5, finger movement 0/5, RLE 2/5 proximal and 0/5 distally. DTR 1+ and no babinski bilaterally. Sensation, coordination not cooperative. Gait not tested.   ASSESSMENT/[name] Erik [name] is a 61 y.o. male with history of HTN, HLD admitted for aphasia and right side weakness. Received tPA and endovascular intervention.    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 Failed attempt left ICA stenting. Embolic likely secondary to large vessel disease source given b/l ICA high grade stenosis with severely calcified  plaques  MRI left MCA patchy infarcts with small hemorrhagic transformation  MRA left ICA not visualized but reconstituted at cavernous portion, left MCA patent  CTA head and neck showed left ICA and left M1 occlusion, right ICA proximal at least 90% stenosis  2D Echo  pending  CUS pending to further evaluation of proximal left ICA  LDL 78  HgbA1c 7.0  Heparin subq for VTE prophylaxis  Diet NPO time specified   No antithrombotic prior to admission, now on ASA after 24h tPA. Not able to do DAPT currently as he has small hemorrhagic transformation.  Ongoing aggressive stroke risk factor management  Therapy recommendations:  pending  Disposition:  Pending  b/l ICA stenosis  Left ICA occlusion on CTA but now likely high grade stenosis post intervention  MRA no visualization of left ICA but reconstituted at cavernous  portion  CUS pending for further evaluation of proximal left ICA  Failed left ICA stenting  Will need [facility]VS consult for b/l ICA stenosis once extubated and stabilized  BP goal 130-160 post procedure  Respiratory failure  s/p intubation for IR procedure  On weaning  CXR much improved  Possible extubation today  Diabetes, new diagnosis  HgbA1c 7.0 goal < 7.0  Uncontrolled  hyperglycemia  CBG monitoring  SSI  Hypertension  Home meds:   Amlodipine and cozaar BP goal 130-160 due to b/l ICA occlusion/stenosis  Stable   s/p albumin 4 doses  Consider IVF if needed  Hyperlipidemia  Home meds:  crestor 40  LDL 78, goal < 70  Resumed crestor   Continue statin at discharge  Other Stroke Risk Factors  Advanced age  Obesity, Body mass index is 31.73 kg/m.   Other Active Problems  Elevated BUN  Hospital day # 1  This patient is critically ill due to Ieft ICA and MCA occlusion s/p tPA and IR, left and right ICA high grade stenosis, failed stenting, hyperglycemia and at significant risk of neurological worsening, death form recurrent stroke, cerebral edema, brain herniation, DKA. This patient's care requires constant monitoring of vital signs, hemodynamics, respiratory and cardiac monitoring, review of multiple databases, neurological assessment, discussion with family, other specialists and medical decision making of high complexity. I spent 35 minutes of neurocritical care time in the care of this patient.  Erik Plan, MD PhD Stroke Neurology 02/17/2017 9:20 AM   To contact Stroke Continuity provider, please refer to [email or web address]. After hours, contact General Neurology

## 2017-02-17 NOTE — Procedures (Signed)
Extubation Procedure Note  Patient Details:   Name: Erik [name] DOB: 11/02/1956 MRN: [id number]   Airway Documentation:     Evaluation  O2 sats: stable throughout Complications: No apparent complications Patient did tolerate procedure well. Bilateral Breath Sounds: Clear   Yes  Pt extubated to a 4lpm mc   Melanee Spry 02/17/2017, 9:26 AM

## 2017-02-17 NOTE — Progress Notes (Signed)
Patient ID: Erik [name], male   DOB: Dec 09, 1955, 61 y.o.   MRN: [id number]    Referring Physician(s): Dr. Ritta Slot  Supervising Physician: Gilmer Mor  Patient Status: [facility] - In-pt  Chief Complaint: L ICA CVA  Subjective: Patient awake and talks.  Follows commands.  Weak on right side.  Allergies: Celecoxib and Shellfish-derived products  Medications: Prior to Admission medications   Medication Sig Start Date End Date Taking? Authorizing Provider  amLODipine (NORVASC) 10 MG tablet Take 10 mg by mouth daily. 03/24/15  Yes Historical Provider, MD  CRESTOR 40 MG tablet TAKE 1 TABLET (40 MG TOTAL) BY MOUTH NIGHTLY. 03/24/15  Yes Historical Provider, MD  FLUoxetine (PROZAC) 20 MG capsule Take 20 mg by mouth daily. 01/25/17  Yes Historical Provider, MD  hydrochlorothiazide (HYDRODIURIL) 25 MG tablet TAKE 1 TABLET (25 MG TOTAL) BY MOUTH ONCE DAILY. 03/24/15  Yes Historical Provider, MD  losartan (COZAAR) 25 MG tablet TAKE 1 TABLET (25 MG TOTAL) BY MOUTH ONCE DAILY. 03/27/15  Yes Historical Provider, MD  Multiple Vitamin (MULTIVITAMIN) tablet Take 1 tablet by mouth daily.   Yes Historical Provider, MD  omeprazole (PRILOSEC) 40 MG capsule TAKE 1 CAPSULE (40 MG TOTAL) BY MOUTH ONCE DAILY. 03/24/15  Yes Historical Provider, MD  phentermine (ADIPEX-P) 37.5 MG tablet Take 37.5 mg by mouth every morning. 01/20/17  Yes Historical Provider, MD  tadalafil (CIALIS) 5 MG tablet Take 5 mg by mouth daily as needed for erectile dysfunction.   Yes Historical Provider, MD  tamsulosin (FLOMAX) 0.4 MG CAPS capsule Take 0.4 mg by mouth daily. 02/12/17  Yes Historical Provider, MD    Vital Signs: BP (!) 144/70 (BP Location: Right Arm)   Pulse 66   Temp 99.4 F (37.4 C) (Axillary)   Resp 16   Ht 5\' 10"  (1.778 m)   Wt 221 lb 1.9 oz (100.3 kg)   SpO2 100%   BMI 31.73 kg/m   Physical Exam: Skin: B CFA sites are c/d/I. Neuro: some right sided facial droop still.  Able to follow commands and can  move right side, just significantly less than left.  Normal 5/5 strength in LUE, 2/5 in RUE  Imaging: Ct Angio Head W Or Wo Contrast  Result Date: 02/16/2017 CLINICAL DATA:  Right-sided weakness and aphasia EXAM: CT ANGIOGRAPHY HEAD AND NECK TECHNIQUE: Multidetector CT imaging of the head and neck was performed using the standard protocol during bolus administration of intravenous contrast. Multiplanar CT image reconstructions and MIPs were obtained to evaluate the vascular anatomy. Carotid stenosis measurements (when applicable) are obtained utilizing NASCET criteria, using the distal internal carotid diameter as the denominator. CONTRAST:  50 mL Isovue 370 IV COMPARISON:  Head CT 02/16/2017 FINDINGS: CTA NECK FINDINGS Aortic arch: There is no aneurysm or dissection of the visualized ascending aorta or aortic arch. There is a normal 3 vessel branching pattern. The visualized proximal subclavian arteries are widely patent. There is aortic atherosclerotic calcification. Right carotid system: There is multifocal atherosclerotic calcification within the right common carotid artery. There is mixed calcified and noncalcified plaque within the proximal right internal carotid artery with at least 90% stenosis. The distal right ICA is normal. Left carotid system: There is calcification at the origin of the left common carotid artery. The left internal carotid artery is occluded at its origin and along the remainder of its cervical course to the skullbase. There is return of contrast enhancement at the distal petrous segment. Vertebral arteries: The vertebral system is codominant. Both vertebral  artery origins are patent. There is multifocal irregular narrowing of the left vertebral artery without hemodynamically significant stenosis. Both V3 and V4 segments are normal. The vertebrobasilar confluence is normal. Skeleton: There is no bony spinal canal stenosis. No lytic or blastic lesions. Other neck: The nasopharynx is  clear. The oropharynx and hypopharynx are normal. The epiglottis is normal. The supraglottic larynx, glottis and subglottic larynx are normal. No retropharyngeal collection. The parapharyngeal spaces are preserved. The parotid and submandibular glands are normal. No sialolithiasis or salivary ductal dilatation. The thyroid gland is normal. There is no cervical lymphadenopathy. Upper chest: No pneumothorax or pleural effusion. No nodules or masses. Review of the MIP images confirms the above findings CTA HEAD FINDINGS Anterior circulation: --Intracranial internal carotid arteries: The left ICA is occluded without opacification of the proximal petrous segment. The distal petrous segment and the remainder of the intracranial left ICA are opacified. There is mild atherosclerotic calcification of the cavernous and clinoid segments. The right side is normal. --Anterior cerebral arteries: Normal. --Middle cerebral arteries: There is complete occlusion of the left middle cerebral artery at its origin. No enhancement of the M1 or and 2 segments is seen. The distal small vessels in the left MCA distribution are relatively symmetric compared to the right. The right MCA is normal. --Posterior communicating arteries: Present bilaterally. Posterior circulation: --Posterior cerebral arteries: Normal. --Superior cerebellar arteries: Normal. --Basilar artery: Normal. --Anterior inferior cerebellar arteries: Not clearly visualized. --Posterior inferior cerebellar arteries: Normal. Venous sinuses: As permitted by contrast timing, patent. Anatomic variants: None Delayed phase: Not performed. Review of the MIP images confirms the above findings IMPRESSION: 1. 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 Distal collateral flow within the left MCA distribution persists and is relatively symmetric to the right side. 2. 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 3. At least 90% stenosis at the origin of the right internal carotid artery secondary to mixed calcified and noncalcified plaque. Critical Value/emergent results were called by telephone at the time of interpretation on 02/16/2017 at 1:25 am to Dr. Ritta Slot , who verbally acknowledged these results. Electronically Signed   By: Deatra Robinson M.D.   On: 02/16/2017 01:47   Ct Head Wo Contrast  Result Date: 02/16/2017 CLINICAL DATA:  Left MCA stroke, status post intervention. EXAM: CT HEAD WITHOUT CONTRAST TECHNIQUE: Contiguous axial images were obtained from the base of the skull through the vertex without intravenous contrast. COMPARISON:  CTA head neck 02/16/2017 FINDINGS: Brain: Gray-white differentiation in the left MCA territory is maintained. The basal ganglia and insular ribbons are preserved. There is no evidence of acute hemorrhage. There is residual contrast material opacifying the venous sinuses. Vascular: The previously seen hyperdense thrombus within the left middle cerebral artery is no longer present. Skull: Normal Sinuses/Orbits: Moderate bilateral maxillary mucosal thickening. Normal orbits. Mastoids are clear. Other: None. IMPRESSION: 1. Status post stroke intervention with resolution of previously seen hyperdense thrombus in the left middle cerebral artery. 2. No acute hemorrhage or cytotoxic edema. Electronically Signed   By: Deatra Robinson M.D.   On: 02/16/2017 06:43   Ct Angio Neck W Or Wo Contrast  Result Date: 02/16/2017 CLINICAL DATA:  Right-sided weakness and aphasia EXAM: CT ANGIOGRAPHY HEAD AND NECK TECHNIQUE: Multidetector CT imaging of the head and neck was performed using the standard protocol during bolus administration of intravenous  contrast. Multiplanar CT image reconstructions and MIPs were obtained to evaluate the vascular anatomy. Carotid stenosis measurements (when  applicable) are obtained utilizing NASCET criteria, using the distal internal carotid diameter as the denominator. CONTRAST:  50 mL Isovue 370 IV COMPARISON:  Head CT 02/16/2017 FINDINGS: CTA NECK FINDINGS Aortic arch: There is no aneurysm or dissection of the visualized ascending aorta or aortic arch. There is a normal 3 vessel branching pattern. The visualized proximal subclavian arteries are widely patent. There is aortic atherosclerotic calcification. Right carotid system: There is multifocal atherosclerotic calcification within the right common carotid artery. There is mixed calcified and noncalcified plaque within the proximal right internal carotid artery with at least 90% stenosis. The distal right ICA is normal. Left carotid system: There is calcification at the origin of the left common carotid artery. The left internal carotid artery is occluded at its origin and along the remainder of its cervical course to the skullbase. There is return of contrast enhancement at the distal petrous segment. Vertebral arteries: The vertebral system is codominant. Both vertebral artery origins are patent. There is multifocal irregular narrowing of the left vertebral artery without hemodynamically significant stenosis. Both V3 and V4 segments are normal. The vertebrobasilar confluence is normal. Skeleton: There is no bony spinal canal stenosis. No lytic or blastic lesions. Other neck: The nasopharynx is clear. The oropharynx and hypopharynx are normal. The epiglottis is normal. The supraglottic larynx, glottis and subglottic larynx are normal. No retropharyngeal collection. The parapharyngeal spaces are preserved. The parotid and submandibular glands are normal. No sialolithiasis or salivary ductal dilatation. The thyroid gland is normal. There is no cervical lymphadenopathy. Upper chest: No pneumothorax or pleural effusion. No nodules or masses. Review of the MIP images confirms the above findings CTA HEAD FINDINGS  Anterior circulation: --Intracranial internal carotid arteries: The left ICA is occluded without opacification of the proximal petrous segment. The distal petrous segment and the remainder of the intracranial left ICA are opacified. There is mild atherosclerotic calcification of the cavernous and clinoid segments. The right side is normal. --Anterior cerebral arteries: Normal. --Middle cerebral arteries: There is complete occlusion of the left middle cerebral artery at its origin. No enhancement of the M1 or and 2 segments is seen. The distal small vessels in the left MCA distribution are relatively symmetric compared to the right. The right MCA is normal. --Posterior communicating arteries: Present bilaterally. Posterior circulation: --Posterior cerebral arteries: Normal. --Superior cerebellar arteries: Normal. --Basilar artery: Normal. --Anterior inferior cerebellar arteries: Not clearly visualized. --Posterior inferior cerebellar arteries: Normal. Venous sinuses: As permitted by contrast timing, patent. Anatomic variants: None Delayed phase: Not performed. Review of the MIP images confirms the above findings IMPRESSION: 1. 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 Distal collateral flow within the left MCA distribution persists and is relatively symmetric to the right side. 2. 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 3. At least 90% stenosis at the origin of the right internal carotid artery secondary to mixed calcified and noncalcified plaque. Critical Value/emergent results were called by telephone at the time of interpretation on 02/16/2017 at 1:25 am to Dr. Ritta Slot , who verbally acknowledged these results. Electronically Signed   By: Deatra Robinson M.D.   On: 02/16/2017 01:47   Mr Maxine Glenn Head Wo Contrast  Result Date:  02/17/2017 CLINICAL DATA:  Stroke, status post tPA  and percutaneous intervention. EXAM: MRI HEAD WITHOUT CONTRAST MRA HEAD WITHOUT CONTRAST TECHNIQUE: Multiplanar, multiecho pulse sequences of the brain and surrounding structures were obtained without intravenous contrast. Angiographic images of the head were obtained using MRA technique without contrast. COMPARISON:  CTA head and neck 02/16/2017 FINDINGS: MRI HEAD FINDINGS Brain: There is multifocal diffusion restriction. The largest areas are within the left caudate and retained in, the anterior left temporal lobe and the posterior left temporal lobe. Other small areas are seen along multiple gyri in the left parietal lobe. All of the areas of diffusion restriction are supratentorial and within the left hemisphere. There is associated hyperintense T2 weighted signal at these locations. There is mild multifocal hyperintense T2-weighted signal within the periventricular white matter, most often seen in the setting of chronic microvascular ischemia. Susceptibility weighted imaging shows petechial hemorrhage in the peripheral left basal ganglia and posterior left temporal lobe. No large intraparenchymal hematoma. No mass lesion or midline shift. No hydrocephalus or extra-axial fluid collection. The midline structures are normal. No age advanced or lobar predominant atrophy. No evidence of chronic microhemorrhage or amyloid angiopathy. Skull and upper cervical spine: The visualized skull base, calvarium, upper cervical spine and extracranial soft tissues are normal. Sinuses/Orbits: There is diffuse paranasal sinus mucosal thickening. The mastoids are clear. Normal orbits. MRA HEAD FINDINGS Intracranial internal carotid arteries: There is attenuated flow related enhancement within the right internal carotid artery, likely indicating slow flow. No flow related enhancement is seen in the left internal carotid artery. Anterior cerebral arteries: Normal. Middle cerebral  arteries: Normal. Posterior communicating arteries: Present bilaterally. Posterior cerebral arteries: Normal. Basilar artery: Normal. Vertebral arteries: Left dominant. Normal. Superior cerebellar arteries: Normal. Anterior inferior cerebellar arteries: Normal. Posterior inferior cerebellar arteries: Normal. IMPRESSION: 1. Multiple areas of acute or early subacute ischemia within the supratentorial left hemisphere, with the largest areas located in the left basal ganglia and left temporal lobe. There is associated cytotoxic edema without midline shift herniation or other significant mass effect. 2. Petechial hemorrhage in the posterior left temporal lobe and peripheral left basal ganglia without space-occupying hematoma. Short interval follow-up head CT is recommended. 3. No flow related enhancement within the visualized left internal carotid artery and diminished enhancement in the right internal carotid artery. This may be due to slow flow, though recurrent occlusion of the treated left internal carotid artery would be difficult to exclude. CTA of the neck may be helpful to assess possible residual or recurrent occlusion/stenosis. 4. Normal flow related enhancement of the left middle cerebral artery. Electronically Signed   By: Deatra Robinson M.D.   On: 02/17/2017 03:35   Mr Brain Wo Contrast  Result Date: 02/17/2017 CLINICAL DATA:  Stroke, status post tPA and percutaneous intervention. EXAM: MRI HEAD WITHOUT CONTRAST MRA HEAD WITHOUT CONTRAST TECHNIQUE: Multiplanar, multiecho pulse sequences of the brain and surrounding structures were obtained without intravenous contrast. Angiographic images of the head were obtained using MRA technique without contrast. COMPARISON:  CTA head and neck 02/16/2017 FINDINGS: MRI HEAD FINDINGS Brain: There is multifocal diffusion restriction. The largest areas are within the left caudate and retained in, the anterior left temporal lobe and the posterior left temporal lobe. Other  small areas are seen along multiple gyri in the left parietal lobe. All of the areas of diffusion restriction are supratentorial and within the left hemisphere. There is associated hyperintense T2 weighted signal at these locations. There is mild multifocal hyperintense T2-weighted signal within the periventricular white matter, most often seen in the setting  of chronic microvascular ischemia. Susceptibility weighted imaging shows petechial hemorrhage in the peripheral left basal ganglia and posterior left temporal lobe. No large intraparenchymal hematoma. No mass lesion or midline shift. No hydrocephalus or extra-axial fluid collection. The midline structures are normal. No age advanced or lobar predominant atrophy. No evidence of chronic microhemorrhage or amyloid angiopathy. Skull and upper cervical spine: The visualized skull base, calvarium, upper cervical spine and extracranial soft tissues are normal. Sinuses/Orbits: There is diffuse paranasal sinus mucosal thickening. The mastoids are clear. Normal orbits. MRA HEAD FINDINGS Intracranial internal carotid arteries: There is attenuated flow related enhancement within the right internal carotid artery, likely indicating slow flow. No flow related enhancement is seen in the left internal carotid artery. Anterior cerebral arteries: Normal. Middle cerebral arteries: Normal. Posterior communicating arteries: Present bilaterally. Posterior cerebral arteries: Normal. Basilar artery: Normal. Vertebral arteries: Left dominant. Normal. Superior cerebellar arteries: Normal. Anterior inferior cerebellar arteries: Normal. Posterior inferior cerebellar arteries: Normal. IMPRESSION: 1. Multiple areas of acute or early subacute ischemia within the supratentorial left hemisphere, with the largest areas located in the left basal ganglia and left temporal lobe. There is associated cytotoxic edema without midline shift herniation or other significant mass effect. 2. Petechial  hemorrhage in the posterior left temporal lobe and peripheral left basal ganglia without space-occupying hematoma. Short interval follow-up head CT is recommended. 3. No flow related enhancement within the visualized left internal carotid artery and diminished enhancement in the right internal carotid artery. This may be due to slow flow, though recurrent occlusion of the treated left internal carotid artery would be difficult to exclude. CTA of the neck may be helpful to assess possible residual or recurrent occlusion/stenosis. 4. Normal flow related enhancement of the left middle cerebral artery. Electronically Signed   By: Deatra Robinson M.D.   On: 02/17/2017 03:35   Ir US Guide Vasc Access Left  Result Date: 02/16/2017 INDICATION: 61 year old male with acute right-sided weakness secondary to acute left hemisphere stroke. CT demonstrates left sided emergent large vessel occlusion of the M1 segment. Last known well 11:30 p.m., and within tPA time frame. IV tPA administered, and given his baseline function, angiogram for mechanical thrombectomy was pursued. CTA demonstrates left ICA occlusion. 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 COMPLICATIONS: None TECHNIQUE: Informed written consent was obtained from the patient's family/wife after a thorough discussion of the procedural risks, benefits and alternatives. Specific  risks discussed include: Bleeding, infection, contrast reaction, kidney injury/failure, need for further procedure/surgery, arterial injury or dissection, embolization to new territory, intracranial hemorrhage (10-15% risk), neurologic deterioration, cardiopulmonary collapse, death. All questions were addressed. Maximal Sterile Barrier Technique was utilized including during the procedure including caps, mask, sterile gowns, sterile gloves, sterile drape, hand hygiene and skin antiseptic. A timeout was performed prior to the initiation of the procedure. Ultrasound survey of the left inguinal region was performed with images stored and sent to PACs. A micropuncture needle was used access the left common femoral artery under ultrasound. With excellent arterial blood flow returned, an .018 micro wire was passed  through the needle, observed to enter the abdominal aorta under fluoroscopy. The needle was removed, and a micropuncture sheath was placed over the wire. The inner dilator and wire were removed, and an 035 Bentson wire was advanced under fluoroscopy into the abdominal aorta. The sheath was removed and a standard 4 [place] vascular sheath was placed for hemodynamic monitoring. The dilator was removed and the sheath was flushed. Ultrasound survey of the right inguinal region was performed with images stored and sent to PACs. A micropuncture needle was used access the right common femoral artery under ultrasound. With excellent arterial blood flow returned, an .018 micro wire was passed through the needle, observed to enter the abdominal aorta under fluoroscopy. The needle was removed, and a micropuncture sheath was placed over the wire. The inner dilator and wire were removed, and an 035 Bentson wire was advanced under fluoroscopy into the abdominal aorta. The sheath was removed and a standard 5 [place] vascular sheath was placed for hemodynamic monitoring. The dilator was removed and the sheath was flushed. A 53F  JB-1 diagnostic catheter was advanced over the wire to the proximal descending thoracic aorta. Wire was then removed. Double flush of the catheter was performed. Catheter was then used to select the right common carotid artery. Angiogram was performed of the right-sided cervical and cerebral vasculature. Catheter was then withdrawn into the aortic arch, and the origin of the left common carotid artery was selected. Road map angiogram was performed. Standard Glidewire was used to advanced the catheter into the left common carotid artery. Formal angiogram was then performed, confirming the left common carotid artery occlusion and left MCA occlusion. The Glidewire was then used to advance the JB 1 catheter into the external carotid artery branches. Glidewire was then exchanged for a Corporate investment banker. Exchange length Rosen wire was then passed through the diagnostic catheter to the distal common carotid artery and the diagnostic catheter was removed. The 5 French sheath was removed and exchanged for 8 French 55 centimeter BrightTip sheath. Sheath was flushed and attached to pressurized and heparinized saline bag for constant forward flow. Then an 8 [place], 85 cm Flowgate balloon tip catheter was prepared on the back table with inflation of the balloon with 50/50 concentration of dilute contrast. The balloon catheter was then advanced over the wire, positioned into the distal common carotid artery. Copious back flush was performed and the balloon catheter was attached to heparinized and pressurized saline bag for forward flow. Rapid transit microcatheter and soft tip Transcend wire were then used to probe the origin of the common carotid artery. The micro wiring catheter combination were successful with crossing the acute occlusion, with the wire catheter combination place distally into the left ICA. Wire was removed and back flow blood was confirmed to confirm luminal position. Small contrast injection through the  microcatheter was performed. Exchange length Transcend wire was then placed through the microcatheter, it and a rapid transit was removed over the wire. Balloon angioplasty was then performed at the left ICA occlusion with a 4mm x 30mm Via-Trak rapid exchange angioplasty balloon. Given the recalcitrant stenosis, a 5mm x 30mm balloon was selected with balloon angioplasty to 7 atmosphere. The balloon was then removed, and Provue18 catheter was advanced over the Transcend wire. Transcend wire was removed and a synchro soft micro wire was selected. Micro wire was then advanced into the MCA. The micro wire was then carefully advanced through the occluded segment. Microcatheter would not pass beyond the ophthalmic segment, likely secondary  to the incomplete support system with the balloon catheter below the ICA origin. The exchange length Transcend wire was then placed through the microcatheter, and the microcatheter removed. An intermediate CAT 6 was selected, placed over the micro wire into the distal common carotid artery. The Provue 18 catheter was then advanced through the intermediate catheter while observing the distal aspect of the wire. The combination of the microcatheter and the intermediate catheter were successful in navigating to the skullbase through the persisting stenosis at the right ICA origin. Single attempt a passing the balloon catheter into the ICA was unsuccessful. Tip of the balloon catheter remained in the common carotid artery. The extra support from the intermediate catheter was useful in navigating the micro wire and microcatheter through the occlusion of the MCA into parietal branch. Micro wire was then removed. Blood was then aspirated through the hub of the microcatheter, and a gentle contrast injection was performed confirming intraluminal position. A rotating hemostatic valve was then attached to the back end of the microcatheter, and a pressurized and heparinized saline bag was attached  to the catheter. 4 x 40 solitaire device was then selected. Back flush was achieved at the rotating hemostatic valve, and then the device was gently advanced through the microcatheter to the distal end. The retriever was then unsheathed by withdrawing the microcatheter under fluoroscopy. Once the retriever was completely unsheathed, control angiogram was performed from the intermediate catheter. 3 minutes of the stent intercolation. Intermediate catheter was then advanced through the ophthalmic segment into the proximal MCA at the proximal aspect of the solitaire. Constant aspiration was then performed at the tip of the intermediate catheter as the retriever was gently and slowly withdrawn with fluoroscopic observation. Once the retriever was entirely removed from the system, free aspiration was confirmed at the hub of the intermediate catheter, with free blood return confirmed. Significant thrombus was retrieved from the solitaire device. Repeat angiogram was then performed through the intermediate catheter confirming restoration of TICI3 flow. The intermediate catheter was then withdrawn into the cervical ICA, in preparation to address the left ICA stenosis with anticipated stent placement. Exchange length Transcend wire was placed through the intermediate catheter which was then removed. Once the exchange length trans and wire was in place, a control angiogram was performed, and there was attempted placement of our selected 8-6 x 40mm Exact stent. The stent would not pass through the lesion at the proximal common carotid artery. Stent was removed from the wire and repeat balloon angioplasty with 5 mm balloon was performed. Again, the stent would not pass through the lesion. Stent was removed from the wire, and a combination of 018 SV8 wire and the rapid transit were used to place the 018 buddy wire adjacent to the 0.014 Transcend wire. This was successful in straightening the carotid tortuosity, however, when  there was attempt made to pass the stent system with 2 wire in place, the lumen on the flow gate catheter would not accept the buddy wire. The stent system was then removed, and a final balloon angioplasty was performed with 5 mm diameter balloon and the 018 SV 8 wire has a modified cutting balloon technique. Eight atmospheric was achieved (nominal diameter). Final attempt at passing the stent across the lesion was unsuccessful, and attempted stenting was abandoned after discussion with Dr. Amada Jupiter of Stroke Neurology. Final control angiogram of the left carotid system was performed. Control angiogram was performed at the right common femoral artery puncture site, after the 55 centimeter 8  French sheath was exchanged for a standard 9 French sheath at the common femoral artery puncture site. Patient tolerated the procedure well and remained hemodynamically stable throughout. No complications were encountered. Estimated blood loss approximately 150 cc. FINDINGS: Ultrasound survey of the left common femoral artery demonstrates wide patency. Ultrasound survey of the right common femoral artery demonstrates wide patency. Initial images: Right common carotid artery:  Normal course caliber and contour. Right external carotid artery: Patent with antegrade flow. Right internal carotid artery: Significant stenosis at the proximal right internal carotid artery with a string sign. The percent stenosis estimated greater than 73% fine ask at criteria, however, this is favored to be greater given there is decreased caliber of the distal ICA secondary to decreased flow. Right MCA: M1 segment patent. The arterial, capillary/ parenchymal, and venous phase are delayed given the significant stenosis proximally. Contralateral filling of the left anterior cerebral artery with patent anterior communicating artery and large right A1 segment. Right ACA: Large right A1 segment with perfusion of left and right ACA territory. Left common  carotid artery: Left common carotid artery origin is a common or drain from the base of the innominate artery. Mild plaque at the origin. Unremarkable course caliber and contour of the common carotid artery. Left external carotid artery: Patent with antegrade flow. Left internal carotid artery: Advanced calcified and soft plaque at the carotid bifurcation and the left internal carotid artery with occlusion at the initiation of the case. Distal segment is patent, which was proven after crossing of the proximal occlusion. Left MCA: M1 occlusion secondary to thrombus was confirmed on the initial angiogram. Left ACA: Occlusion extended into the left A1 segment, compatible with a T occlusion Final images: After successful thrombectomy, there is restoration of TICI3 flow of the left hemisphere. Restoration of flow through the left A1 segment. Residual high-grade stenosis at the left ICA origin which was recalcitrant to multiple balloon angioplasty up to 5 mm with a cutting balloon technique. Dense plaque at the left ICA origin as well as the geometry of the ICA origin precluded placement of the selected stent. Final image demonstrates flow through the left ICA and left MCA territory PROCEDURE: Cervical cerebral angiogram with left MCA thrombectomy with a combined aspiration and solitaire technique. 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 Restoration of TICI 3 flow was achieved with 1 pass of solitaire device and local aspiration, and flow through the left ICA occlusion was re-established by balloon angioplasty. Dense calcified plaque and the geometry of the proximal left ICA precluded placement of the selected Exact stent, and the patient will remain on medical therapy. Angiogram of the right carotid system demonstrates high-grade stenosis of proximal right ICA secondary to  advanced atherosclerotic changes. Although the angiographic measurement of the string sign is 73% stenosis by NASCET criteria, the degree of stenosis is favored to be greater, as the distal vessel is compromised in diameter given the low flow. Signed, Yvone Neu. Loreta Ave DO [facility] Radiology PLAN: Noncontrast head CT postprocedure. ICU admission. Continue anti-platelet medication. Right common femoral 9 French sheath maintained which will be removed after 12 hours. Left common femoral 4 French sheath maintained for hemodynamic monitoring. Electronically Signed   By: Gilmer Mor D.O.   On: 02/16/2017 11:18   Ir US Guide Vasc Access Right  Result Date: 02/16/2017 INDICATION: 61 year old male with acute  right-sided weakness secondary to acute left hemisphere stroke. CT demonstrates left sided emergent large vessel occlusion of the M1 segment. Last known well 11:30 p.m., and within tPA time frame. IV tPA administered, and given his baseline function, angiogram for mechanical thrombectomy was pursued. CTA demonstrates left ICA occlusion. 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 COMPLICATIONS: None TECHNIQUE: Informed written consent was obtained from the patient's family/wife after a thorough discussion of the procedural risks, benefits and alternatives. Specific risks  discussed include: Bleeding, infection, contrast reaction, kidney injury/failure, need for further procedure/surgery, arterial injury or dissection, embolization to new territory, intracranial hemorrhage (10-15% risk), neurologic deterioration, cardiopulmonary collapse, death. All questions were addressed. Maximal Sterile Barrier Technique was utilized including during the procedure including caps, mask, sterile gowns, sterile gloves, sterile drape, hand hygiene and skin antiseptic. A timeout was performed prior to the initiation of the procedure. Ultrasound survey of the left inguinal region was performed with images stored and sent to PACs. A micropuncture needle was used access the left common femoral artery under ultrasound. With excellent arterial blood flow returned, an .018 micro wire was passed through the needle, observed to enter the abdominal aorta under fluoroscopy. The needle was removed, and a micropuncture sheath was placed over the wire. The inner dilator and wire were removed, and an 035 Bentson wire was advanced under fluoroscopy into the abdominal aorta. The sheath was removed and a standard 4 [place] vascular sheath was placed for hemodynamic monitoring. The dilator was removed and the sheath was flushed. Ultrasound survey of the right inguinal region was performed with images stored and sent to PACs. A micropuncture needle was used access the right common femoral artery under ultrasound. With excellent arterial blood flow returned, an .018 micro wire was passed through the needle, observed to enter the abdominal aorta under fluoroscopy. The needle was removed, and a micropuncture sheath was placed over the wire. The inner dilator and wire were removed, and an 035 Bentson wire was advanced under fluoroscopy into the abdominal aorta. The sheath was removed and a standard 5 [place] vascular sheath was placed for hemodynamic monitoring. The dilator was removed and the sheath was flushed. A 19F JB-1  diagnostic catheter was advanced over the wire to the proximal descending thoracic aorta. Wire was then removed. Double flush of the catheter was performed. Catheter was then used to select the right common carotid artery. Angiogram was performed of the right-sided cervical and cerebral vasculature. Catheter was then withdrawn into the aortic arch, and the origin of the left common carotid artery was selected. Road map angiogram was performed. Standard Glidewire was used to advanced the catheter into the left common carotid artery. Formal angiogram was then performed, confirming the left common carotid artery occlusion and left MCA occlusion. The Glidewire was then used to advance the JB 1 catheter into the external carotid artery branches. Glidewire was then exchanged for a Corporate investment banker. Exchange length Rosen wire was then passed through the diagnostic catheter to the distal common carotid artery and the diagnostic catheter was removed. The  5 French sheath was removed and exchanged for 8 French 55 centimeter BrightTip sheath. Sheath was flushed and attached to pressurized and heparinized saline bag for constant forward flow. Then an 8 [place], 85 cm Flowgate balloon tip catheter was prepared on the back table with inflation of the balloon with 50/50 concentration of dilute contrast. The balloon catheter was then advanced over the wire, positioned into the distal common carotid artery. Copious back flush was performed and the balloon catheter was attached to heparinized and pressurized saline bag for forward flow. Rapid transit microcatheter and soft tip Transcend wire were then used to probe the origin of the common carotid artery. The micro wiring catheter combination were successful with crossing the acute occlusion, with the wire catheter combination place distally into the left ICA. Wire was removed and back flow blood was confirmed to confirm luminal position. Small contrast injection through the microcatheter  was performed. Exchange length Transcend wire was then placed through the microcatheter, it and a rapid transit was removed over the wire. Balloon angioplasty was then performed at the left ICA occlusion with a 4mm x 30mm Via-Trak rapid exchange angioplasty balloon. Given the recalcitrant stenosis, a 5mm x 30mm balloon was selected with balloon angioplasty to 7 atmosphere. The balloon was then removed, and Provue18 catheter was advanced over the Transcend wire. Transcend wire was removed and a synchro soft micro wire was selected. Micro wire was then advanced into the MCA. The micro wire was then carefully advanced through the occluded segment. Microcatheter would not pass beyond the ophthalmic segment, likely secondary to the incomplete support system with the balloon catheter below the ICA origin. The exchange length Transcend wire was then placed through the microcatheter, and the microcatheter removed. An intermediate CAT 6 was selected, placed over the micro wire into the distal common carotid artery. The Provue 18 catheter was then advanced through the intermediate catheter while observing the distal aspect of the wire. The combination of the microcatheter and the intermediate catheter were successful in navigating to the skullbase through the persisting stenosis at the right ICA origin. Single attempt a passing the balloon catheter into the ICA was unsuccessful. Tip of the balloon catheter remained in the common carotid artery. The extra support from the intermediate catheter was useful in navigating the micro wire and microcatheter through the occlusion of the MCA into parietal branch. Micro wire was then removed. Blood was then aspirated through the hub of the microcatheter, and a gentle contrast injection was performed confirming intraluminal position. A rotating hemostatic valve was then attached to the back end of the microcatheter, and a pressurized and heparinized saline bag was attached to the  catheter. 4 x 40 solitaire device was then selected. Back flush was achieved at the rotating hemostatic valve, and then the device was gently advanced through the microcatheter to the distal end. The retriever was then unsheathed by withdrawing the microcatheter under fluoroscopy. Once the retriever was completely unsheathed, control angiogram was performed from the intermediate catheter. 3 minutes of the stent intercolation. Intermediate catheter was then advanced through the ophthalmic segment into the proximal MCA at the proximal aspect of the solitaire. Constant aspiration was then performed at the tip of the intermediate catheter as the retriever was gently and slowly withdrawn with fluoroscopic observation. Once the retriever was entirely removed from the system, free aspiration was confirmed at the hub of the intermediate catheter, with free blood return confirmed. Significant thrombus was retrieved from the solitaire device. Repeat angiogram was then  performed through the intermediate catheter confirming restoration of TICI3 flow. The intermediate catheter was then withdrawn into the cervical ICA, in preparation to address the left ICA stenosis with anticipated stent placement. Exchange length Transcend wire was placed through the intermediate catheter which was then removed. Once the exchange length trans and wire was in place, a control angiogram was performed, and there was attempted placement of our selected 8-6 x 40mm Exact stent. The stent would not pass through the lesion at the proximal common carotid artery. Stent was removed from the wire and repeat balloon angioplasty with 5 mm balloon was performed. Again, the stent would not pass through the lesion. Stent was removed from the wire, and a combination of 018 SV8 wire and the rapid transit were used to place the 018 buddy wire adjacent to the 0.014 Transcend wire. This was successful in straightening the carotid tortuosity, however, when there was  attempt made to pass the stent system with 2 wire in place, the lumen on the flow gate catheter would not accept the buddy wire. The stent system was then removed, and a final balloon angioplasty was performed with 5 mm diameter balloon and the 018 SV 8 wire has a modified cutting balloon technique. Eight atmospheric was achieved (nominal diameter). Final attempt at passing the stent across the lesion was unsuccessful, and attempted stenting was abandoned after discussion with Dr. Amada Jupiter of Stroke Neurology. Final control angiogram of the left carotid system was performed. Control angiogram was performed at the right common femoral artery puncture site, after the 55 centimeter 8 French sheath was exchanged for a standard 9 French sheath at the common femoral artery puncture site. Patient tolerated the procedure well and remained hemodynamically stable throughout. No complications were encountered. Estimated blood loss approximately 150 cc. FINDINGS: Ultrasound survey of the left common femoral artery demonstrates wide patency. Ultrasound survey of the right common femoral artery demonstrates wide patency. Initial images: Right common carotid artery:  Normal course caliber and contour. Right external carotid artery: Patent with antegrade flow. Right internal carotid artery: Significant stenosis at the proximal right internal carotid artery with a string sign. The percent stenosis estimated greater than 73% fine ask at criteria, however, this is favored to be greater given there is decreased caliber of the distal ICA secondary to decreased flow. Right MCA: M1 segment patent. The arterial, capillary/ parenchymal, and venous phase are delayed given the significant stenosis proximally. Contralateral filling of the left anterior cerebral artery with patent anterior communicating artery and large right A1 segment. Right ACA: Large right A1 segment with perfusion of left and right ACA territory. Left common carotid  artery: Left common carotid artery origin is a common or drain from the base of the innominate artery. Mild plaque at the origin. Unremarkable course caliber and contour of the common carotid artery. Left external carotid artery: Patent with antegrade flow. Left internal carotid artery: Advanced calcified and soft plaque at the carotid bifurcation and the left internal carotid artery with occlusion at the initiation of the case. Distal segment is patent, which was proven after crossing of the proximal occlusion. Left MCA: M1 occlusion secondary to thrombus was confirmed on the initial angiogram. Left ACA: Occlusion extended into the left A1 segment, compatible with a T occlusion Final images: After successful thrombectomy, there is restoration of TICI3 flow of the left hemisphere. Restoration of flow through the left A1 segment. Residual high-grade stenosis at the left ICA origin which was recalcitrant to multiple balloon angioplasty up to  5 mm with a cutting balloon technique. Dense plaque at the left ICA origin as well as the geometry of the ICA origin precluded placement of the selected stent. Final image demonstrates flow through the left ICA and left MCA territory PROCEDURE: Cervical cerebral angiogram with left MCA thrombectomy with a combined aspiration and solitaire technique. 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 Restoration of TICI 3 flow was achieved with 1 pass of solitaire device and local aspiration, and flow through the left ICA occlusion was re-established by balloon angioplasty. Dense calcified plaque and the geometry of the proximal left ICA precluded placement of the selected Exact stent, and the patient will remain on medical therapy. Angiogram of the right carotid system demonstrates high-grade stenosis of proximal right ICA secondary to advanced  atherosclerotic changes. Although the angiographic measurement of the string sign is 73% stenosis by NASCET criteria, the degree of stenosis is favored to be greater, as the distal vessel is compromised in diameter given the low flow. Signed, Yvone Neu. Loreta Ave DO [facility] Radiology PLAN: Noncontrast head CT postprocedure. ICU admission. Continue anti-platelet medication. Right common femoral 9 French sheath maintained which will be removed after 12 hours. Left common femoral 4 French sheath maintained for hemodynamic monitoring. Electronically Signed   By: Gilmer Mor D.O.   On: 02/16/2017 11:18   Dg Chest Port 1 View  Result Date: 02/17/2017 CLINICAL DATA:  Respiratory failure EXAM: PORTABLE CHEST 1 VIEW COMPARISON:  02/16/2017 FINDINGS: The trachea tube, NG tube unchanged. Stable enlarged cardiac silhouette. There is improvement in central venous congestion. Mild RIGHT basilar atelectasis remains. IMPRESSION: 1. Stable support apparatus. 2. Improved aeration to the lungs with persistent RIGHT basilar atelectasis. Electronically Signed   By: Genevive Bi M.D.   On: 02/17/2017 08:19   Dg Chest Port 1 View  Result Date: 02/16/2017 CLINICAL DATA:  Respiratory failure, intubated EXAM: PORTABLE CHEST 1 VIEW COMPARISON:  None available FINDINGS: Endotracheal tube 4.3 cm above the carina. NG tube within the stomach, tip not visualized. Marked cardiomegaly with central vascular congestion and low lung volumes. Mild streaky edema pattern and basilar atelectasis throughout both lungs. No large effusion or pneumothorax. Trachea is midline. No acute osseous finding. Remote postop changes of the right shoulder. IMPRESSION: Cardiomegaly with mild central vascular congestion and low lung volumes Scattered mild bilateral streaky edema pattern and atelectasis. Support apparatus in good position as above. Electronically Signed   By: Judie Petit.  Shick M.D.   On: 02/16/2017  08:54   Dg Abd Portable 1v  Result Date: 02/16/2017 CLINICAL DATA:  Orogastric tube placement EXAM: PORTABLE ABDOMEN - 1 VIEW COMPARISON:  None. FINDINGS: The tip of the OG tube is in the pyloric region of the stomach and may be in the distal stomach or just transpyloric into the proximal duodenum retrocardiac collapse/consolidation noted left lung base. Visualized abdomen shows nonspecific gas pattern. IMPRESSION: OG tube tip in the region of the pylorus and may be in the duodenal bulb or proximal stomach. Electronically Signed   By: Kennith Center M.D.   On: 02/16/2017 12:20   Ir Percutaneous Art Thrombectomy/infusion Intracranial Inc Diag Angio  Result Date: 02/16/2017 INDICATION: 61 year old male with acute right-sided weakness secondary to acute left hemisphere stroke. CT demonstrates left sided emergent large vessel occlusion of the M1 segment. Last known well 11:30 p.m., and within tPA time frame.  IV tPA administered, and given his baseline function, angiogram for mechanical thrombectomy was pursued. CTA demonstrates left ICA occlusion. 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 COMPLICATIONS: None TECHNIQUE: Informed written consent was obtained from the patient's family/wife after a thorough discussion of the procedural risks, benefits and alternatives. Specific risks discussed include: Bleeding, infection,  contrast reaction, kidney injury/failure, need for further procedure/surgery, arterial injury or dissection, embolization to new territory, intracranial hemorrhage (10-15% risk), neurologic deterioration, cardiopulmonary collapse, death. All questions were addressed. Maximal Sterile Barrier Technique was utilized including during the procedure including caps, mask, sterile gowns, sterile gloves, sterile drape, hand hygiene and skin antiseptic. A timeout was performed prior to the initiation of the procedure. Ultrasound survey of the left inguinal region was performed with images stored and sent to PACs. A micropuncture needle was used access the left common femoral artery under ultrasound. With excellent arterial blood flow returned, an .018 micro wire was passed through the needle, observed to enter the abdominal aorta under fluoroscopy. The needle was removed, and a micropuncture sheath was placed over the wire. The inner dilator and wire were removed, and an 035 Bentson wire was advanced under fluoroscopy into the abdominal aorta. The sheath was removed and a standard 4 [place] vascular sheath was placed for hemodynamic monitoring. The dilator was removed and the sheath was flushed. Ultrasound survey of the right inguinal region was performed with images stored and sent to PACs. A micropuncture needle was used access the right common femoral artery under ultrasound. With excellent arterial blood flow returned, an .018 micro wire was passed through the needle, observed to enter the abdominal aorta under fluoroscopy. The needle was removed, and a micropuncture sheath was placed over the wire. The inner dilator and wire were removed, and an 035 Bentson wire was advanced under fluoroscopy into the abdominal aorta. The sheath was removed and a standard 5 [place] vascular sheath was placed for hemodynamic monitoring. The dilator was removed and the sheath was flushed. A 88F JB-1 diagnostic catheter was advanced over the  wire to the proximal descending thoracic aorta. Wire was then removed. Double flush of the catheter was performed. Catheter was then used to select the right common carotid artery. Angiogram was performed of the right-sided cervical and cerebral vasculature. Catheter was then withdrawn into the aortic arch, and the origin of the left common carotid artery was selected. Road map angiogram was performed. Standard Glidewire was used to advanced the catheter into the left common carotid artery. Formal angiogram was then performed, confirming the left common carotid artery occlusion and left MCA occlusion. The Glidewire was then used to advance the JB 1 catheter into the external carotid artery branches. Glidewire was then exchanged for a Corporate investment banker. Exchange length Rosen wire was then passed through the diagnostic catheter to the distal common carotid artery and the diagnostic catheter was removed. The 5 French sheath was removed and exchanged for 8 French 55 centimeter BrightTip sheath. Sheath was flushed and attached to pressurized and heparinized saline bag for constant forward flow. Then  an 8 [place], 85 cm Flowgate balloon tip catheter was prepared on the back table with inflation of the balloon with 50/50 concentration of dilute contrast. The balloon catheter was then advanced over the wire, positioned into the distal common carotid artery. Copious back flush was performed and the balloon catheter was attached to heparinized and pressurized saline bag for forward flow. Rapid transit microcatheter and soft tip Transcend wire were then used to probe the origin of the common carotid artery. The micro wiring catheter combination were successful with crossing the acute occlusion, with the wire catheter combination place distally into the left ICA. Wire was removed and back flow blood was confirmed to confirm luminal position. Small contrast injection through the microcatheter was performed. Exchange length Transcend  wire was then placed through the microcatheter, it and a rapid transit was removed over the wire. Balloon angioplasty was then performed at the left ICA occlusion with a 4mm x 30mm Via-Trak rapid exchange angioplasty balloon. Given the recalcitrant stenosis, a 5mm x 30mm balloon was selected with balloon angioplasty to 7 atmosphere. The balloon was then removed, and Provue18 catheter was advanced over the Transcend wire. Transcend wire was removed and a synchro soft micro wire was selected. Micro wire was then advanced into the MCA. The micro wire was then carefully advanced through the occluded segment. Microcatheter would not pass beyond the ophthalmic segment, likely secondary to the incomplete support system with the balloon catheter below the ICA origin. The exchange length Transcend wire was then placed through the microcatheter, and the microcatheter removed. An intermediate CAT 6 was selected, placed over the micro wire into the distal common carotid artery. The Provue 18 catheter was then advanced through the intermediate catheter while observing the distal aspect of the wire. The combination of the microcatheter and the intermediate catheter were successful in navigating to the skullbase through the persisting stenosis at the right ICA origin. Single attempt a passing the balloon catheter into the ICA was unsuccessful. Tip of the balloon catheter remained in the common carotid artery. The extra support from the intermediate catheter was useful in navigating the micro wire and microcatheter through the occlusion of the MCA into parietal branch. Micro wire was then removed. Blood was then aspirated through the hub of the microcatheter, and a gentle contrast injection was performed confirming intraluminal position. A rotating hemostatic valve was then attached to the back end of the microcatheter, and a pressurized and heparinized saline bag was attached to the catheter. 4 x 40 solitaire device was then  selected. Back flush was achieved at the rotating hemostatic valve, and then the device was gently advanced through the microcatheter to the distal end. The retriever was then unsheathed by withdrawing the microcatheter under fluoroscopy. Once the retriever was completely unsheathed, control angiogram was performed from the intermediate catheter. 3 minutes of the stent intercolation. Intermediate catheter was then advanced through the ophthalmic segment into the proximal MCA at the proximal aspect of the solitaire. Constant aspiration was then performed at the tip of the intermediate catheter as the retriever was gently and slowly withdrawn with fluoroscopic observation. Once the retriever was entirely removed from the system, free aspiration was confirmed at the hub of the intermediate catheter, with free blood return confirmed. Significant thrombus was retrieved from the solitaire device. Repeat angiogram was then performed through the intermediate catheter confirming restoration of TICI3 flow. The intermediate catheter was then withdrawn into the cervical ICA, in preparation to address the left ICA stenosis with anticipated  stent placement. Exchange length Transcend wire was placed through the intermediate catheter which was then removed. Once the exchange length trans and wire was in place, a control angiogram was performed, and there was attempted placement of our selected 8-6 x 40mm Exact stent. The stent would not pass through the lesion at the proximal common carotid artery. Stent was removed from the wire and repeat balloon angioplasty with 5 mm balloon was performed. Again, the stent would not pass through the lesion. Stent was removed from the wire, and a combination of 018 SV8 wire and the rapid transit were used to place the 018 buddy wire adjacent to the 0.014 Transcend wire. This was successful in straightening the carotid tortuosity, however, when there was attempt made to pass the stent system with  2 wire in place, the lumen on the flow gate catheter would not accept the buddy wire. The stent system was then removed, and a final balloon angioplasty was performed with 5 mm diameter balloon and the 018 SV 8 wire has a modified cutting balloon technique. Eight atmospheric was achieved (nominal diameter). Final attempt at passing the stent across the lesion was unsuccessful, and attempted stenting was abandoned after discussion with Dr. Amada Jupiter of Stroke Neurology. Final control angiogram of the left carotid system was performed. Control angiogram was performed at the right common femoral artery puncture site, after the 55 centimeter 8 French sheath was exchanged for a standard 9 French sheath at the common femoral artery puncture site. Patient tolerated the procedure well and remained hemodynamically stable throughout. No complications were encountered. Estimated blood loss approximately 150 cc. FINDINGS: Ultrasound survey of the left common femoral artery demonstrates wide patency. Ultrasound survey of the right common femoral artery demonstrates wide patency. Initial images: Right common carotid artery:  Normal course caliber and contour. Right external carotid artery: Patent with antegrade flow. Right internal carotid artery: Significant stenosis at the proximal right internal carotid artery with a string sign. The percent stenosis estimated greater than 73% fine ask at criteria, however, this is favored to be greater given there is decreased caliber of the distal ICA secondary to decreased flow. Right MCA: M1 segment patent. The arterial, capillary/ parenchymal, and venous phase are delayed given the significant stenosis proximally. Contralateral filling of the left anterior cerebral artery with patent anterior communicating artery and large right A1 segment. Right ACA: Large right A1 segment with perfusion of left and right ACA territory. Left common carotid artery: Left common carotid artery origin is a  common or drain from the base of the innominate artery. Mild plaque at the origin. Unremarkable course caliber and contour of the common carotid artery. Left external carotid artery: Patent with antegrade flow. Left internal carotid artery: Advanced calcified and soft plaque at the carotid bifurcation and the left internal carotid artery with occlusion at the initiation of the case. Distal segment is patent, which was proven after crossing of the proximal occlusion. Left MCA: M1 occlusion secondary to thrombus was confirmed on the initial angiogram. Left ACA: Occlusion extended into the left A1 segment, compatible with a T occlusion Final images: After successful thrombectomy, there is restoration of TICI3 flow of the left hemisphere. Restoration of flow through the left A1 segment. Residual high-grade stenosis at the left ICA origin which was recalcitrant to multiple balloon angioplasty up to 5 mm with a cutting balloon technique. Dense plaque at the left ICA origin as well as the geometry of the ICA origin precluded placement of the selected stent. Final  image demonstrates flow through the left ICA and left MCA territory PROCEDURE: Cervical cerebral angiogram with left MCA thrombectomy with a combined aspiration and solitaire technique. 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 Restoration of TICI 3 flow was achieved with 1 pass of solitaire device and local aspiration, and flow through the left ICA occlusion was re-established by balloon angioplasty. Dense calcified plaque and the geometry of the proximal left ICA precluded placement of the selected Exact stent, and the patient will remain on medical therapy. Angiogram of the right carotid system demonstrates high-grade stenosis of proximal right ICA secondary to advanced atherosclerotic changes. Although the  angiographic measurement of the string sign is 73% stenosis by NASCET criteria, the degree of stenosis is favored to be greater, as the distal vessel is compromised in diameter given the low flow. Signed, Yvone Neu. Loreta Ave DO [facility] Radiology PLAN: Noncontrast head CT postprocedure. ICU admission. Continue anti-platelet medication. Right common femoral 9 French sheath maintained which will be removed after 12 hours. Left common femoral 4 French sheath maintained for hemodynamic monitoring. Electronically Signed   By: Gilmer Mor D.O.   On: 02/16/2017 11:18   Ct Head Code Stroke W/o Cm  Result Date: 02/16/2017 CLINICAL DATA:  Code stroke.  Right-sided weakness and aphasia EXAM: CT HEAD WITHOUT CONTRAST TECHNIQUE: Contiguous axial images were obtained from the base of the skull through the vertex without intravenous contrast. COMPARISON:  None. FINDINGS: Brain: No mass lesion, intraparenchymal hemorrhage or extra-axial collection. No evidence of acute cortical infarct. Brain parenchyma and CSF-containing spaces are normal for age. Vascular: There is a hyperdense left MCA. Skull: Normal visualized skull base, calvarium and extracranial soft tissues. Sinuses/Orbits: No sinus fluid levels or advanced mucosal thickening. No mastoid effusion. Normal orbits. ASPECTS [facility] Stroke Program Early CT Score) - Ganglionic level infarction (caudate, lentiform nuclei, internal capsule, insula, M1-M3 cortex): 7 - Supraganglionic infarction (M4-M6 cortex): 3 Total score (0-10 with 10 being normal): 10 IMPRESSION: 1. Hyper dense left middle cerebral artery, likely indicating the presence of acute thrombus. 2. ASPECTS is 10. 3. No intracranial hemorrhage. These results were called by telephone at the time of interpretation on 02/16/2017 at 12:53 am to Dr. Ritta Slot, who verbally acknowledged these results. Electronically Signed   By: Deatra Robinson M.D.   On:  02/16/2017 00:55   Ir Pta Non Coro-lower Extrem  Result Date: 02/16/2017 INDICATION: 61 year old male with acute right-sided weakness secondary to acute left hemisphere stroke. CT demonstrates left sided emergent large vessel occlusion of the M1 segment. Last known well 11:30 p.m., and within tPA time frame. IV tPA administered, and given his baseline function, angiogram for mechanical thrombectomy was pursued. CTA demonstrates left ICA occlusion. 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 COMPLICATIONS: None TECHNIQUE: Informed written consent was obtained from the patient's family/wife after a thorough discussion of the procedural risks, benefits and alternatives. Specific risks discussed include:  Bleeding, infection, contrast reaction, kidney injury/failure, need for further procedure/surgery, arterial injury or dissection, embolization to new territory, intracranial hemorrhage (10-15% risk), neurologic deterioration, cardiopulmonary collapse, death. All questions were addressed. Maximal Sterile Barrier Technique was utilized including during the procedure including caps, mask, sterile gowns, sterile gloves, sterile drape, hand hygiene and skin antiseptic. A timeout was performed prior to the initiation of the procedure. Ultrasound survey of the left inguinal region was performed with images stored and sent to PACs. A  micropuncture needle was used access the left common femoral artery under ultrasound. With excellent arterial blood flow returned, an .018 micro wire was passed through the needle, observed to enter the abdominal aorta under fluoroscopy. The needle was removed, and a micropuncture sheath was placed over the wire. The inner dilator and wire were removed, and an 035 Bentson wire was advanced under fluoroscopy into the abdominal aorta. The sheath was removed and a standard 4 [place] vascular sheath was placed for hemodynamic monitoring. The dilator was removed and the sheath was flushed. Ultrasound survey of the right inguinal region was performed with images stored and sent to PACs. A micropuncture needle was used access the right common femoral artery under ultrasound. With excellent arterial blood flow returned, an .018 micro wire was passed through the needle, observed to enter the abdominal aorta under fluoroscopy. The needle was removed, and a micropuncture sheath was placed over the wire. The inner dilator and wire were removed, and an 035 Bentson wire was advanced under fluoroscopy into the abdominal aorta. The sheath was removed and a standard 5 [place] vascular sheath was placed for hemodynamic monitoring. The dilator was removed and the sheath was flushed. A 34F JB-1 diagnostic catheter was advanced over the wire to the proximal descending thoracic aorta. Wire was then removed. Double flush of the catheter was performed. Catheter was then used to select the right common carotid artery. Angiogram was performed of the right-sided cervical and cerebral vasculature. Catheter was then withdrawn into the aortic arch, and the origin of the left common carotid artery was selected. Road map angiogram was performed. Standard Glidewire was used to advanced the catheter into the left common carotid artery. Formal angiogram was then performed, confirming the left common carotid artery occlusion and left MCA occlusion. The  Glidewire was then used to advance the JB 1 catheter into the external carotid artery branches. Glidewire was then exchanged for a Corporate investment banker. Exchange length Rosen wire was then passed through the diagnostic catheter to the distal common carotid artery and the diagnostic catheter was removed. The 5 French sheath was removed and exchanged for 8 French 55 centimeter BrightTip sheath. Sheath was flushed and attached to pressurized and heparinized saline bag for constant forward flow. Then an 8 [place], 85 cm Flowgate balloon tip catheter was prepared on the back table with inflation of the balloon with 50/50 concentration of dilute contrast. The balloon catheter was then advanced over the wire, positioned into the distal common carotid artery. Copious back flush was performed and the balloon catheter was attached to heparinized and pressurized saline bag for forward flow. Rapid transit microcatheter and soft tip Transcend wire were then used to probe the origin of the common carotid artery. The micro wiring catheter combination were successful with crossing the acute occlusion, with the wire catheter combination place distally into the left ICA. Wire was removed and back flow blood was confirmed to confirm luminal position. Small contrast injection through the microcatheter was performed. Exchange length Transcend wire was  then placed through the microcatheter, it and a rapid transit was removed over the wire. Balloon angioplasty was then performed at the left ICA occlusion with a 4mm x 30mm Via-Trak rapid exchange angioplasty balloon. Given the recalcitrant stenosis, a 5mm x 30mm balloon was selected with balloon angioplasty to 7 atmosphere. The balloon was then removed, and Provue18 catheter was advanced over the Transcend wire. Transcend wire was removed and a synchro soft micro wire was selected. Micro wire was then advanced into the MCA. The micro wire was then carefully advanced through the occluded segment.  Microcatheter would not pass beyond the ophthalmic segment, likely secondary to the incomplete support system with the balloon catheter below the ICA origin. The exchange length Transcend wire was then placed through the microcatheter, and the microcatheter removed. An intermediate CAT 6 was selected, placed over the micro wire into the distal common carotid artery. The Provue 18 catheter was then advanced through the intermediate catheter while observing the distal aspect of the wire. The combination of the microcatheter and the intermediate catheter were successful in navigating to the skullbase through the persisting stenosis at the right ICA origin. Single attempt a passing the balloon catheter into the ICA was unsuccessful. Tip of the balloon catheter remained in the common carotid artery. The extra support from the intermediate catheter was useful in navigating the micro wire and microcatheter through the occlusion of the MCA into parietal branch. Micro wire was then removed. Blood was then aspirated through the hub of the microcatheter, and a gentle contrast injection was performed confirming intraluminal position. A rotating hemostatic valve was then attached to the back end of the microcatheter, and a pressurized and heparinized saline bag was attached to the catheter. 4 x 40 solitaire device was then selected. Back flush was achieved at the rotating hemostatic valve, and then the device was gently advanced through the microcatheter to the distal end. The retriever was then unsheathed by withdrawing the microcatheter under fluoroscopy. Once the retriever was completely unsheathed, control angiogram was performed from the intermediate catheter. 3 minutes of the stent intercolation. Intermediate catheter was then advanced through the ophthalmic segment into the proximal MCA at the proximal aspect of the solitaire. Constant aspiration was then performed at the tip of the intermediate catheter as the retriever  was gently and slowly withdrawn with fluoroscopic observation. Once the retriever was entirely removed from the system, free aspiration was confirmed at the hub of the intermediate catheter, with free blood return confirmed. Significant thrombus was retrieved from the solitaire device. Repeat angiogram was then performed through the intermediate catheter confirming restoration of TICI3 flow. The intermediate catheter was then withdrawn into the cervical ICA, in preparation to address the left ICA stenosis with anticipated stent placement. Exchange length Transcend wire was placed through the intermediate catheter which was then removed. Once the exchange length trans and wire was in place, a control angiogram was performed, and there was attempted placement of our selected 8-6 x 40mm Exact stent. The stent would not pass through the lesion at the proximal common carotid artery. Stent was removed from the wire and repeat balloon angioplasty with 5 mm balloon was performed. Again, the stent would not pass through the lesion. Stent was removed from the wire, and a combination of 018 SV8 wire and the rapid transit were used to place the 018 buddy wire adjacent to the 0.014 Transcend wire. This was successful in straightening the carotid tortuosity, however, when there was attempt made to pass the  stent system with 2 wire in place, the lumen on the flow gate catheter would not accept the buddy wire. The stent system was then removed, and a final balloon angioplasty was performed with 5 mm diameter balloon and the 018 SV 8 wire has a modified cutting balloon technique. Eight atmospheric was achieved (nominal diameter). Final attempt at passing the stent across the lesion was unsuccessful, and attempted stenting was abandoned after discussion with Dr. Amada Jupiter of Stroke Neurology. Final control angiogram of the left carotid system was performed. Control angiogram was performed at the right common femoral artery puncture  site, after the 55 centimeter 8 French sheath was exchanged for a standard 9 French sheath at the common femoral artery puncture site. Patient tolerated the procedure well and remained hemodynamically stable throughout. No complications were encountered. Estimated blood loss approximately 150 cc. FINDINGS: Ultrasound survey of the left common femoral artery demonstrates wide patency. Ultrasound survey of the right common femoral artery demonstrates wide patency. Initial images: Right common carotid artery:  Normal course caliber and contour. Right external carotid artery: Patent with antegrade flow. Right internal carotid artery: Significant stenosis at the proximal right internal carotid artery with a string sign. The percent stenosis estimated greater than 73% fine ask at criteria, however, this is favored to be greater given there is decreased caliber of the distal ICA secondary to decreased flow. Right MCA: M1 segment patent. The arterial, capillary/ parenchymal, and venous phase are delayed given the significant stenosis proximally. Contralateral filling of the left anterior cerebral artery with patent anterior communicating artery and large right A1 segment. Right ACA: Large right A1 segment with perfusion of left and right ACA territory. Left common carotid artery: Left common carotid artery origin is a common or drain from the base of the innominate artery. Mild plaque at the origin. Unremarkable course caliber and contour of the common carotid artery. Left external carotid artery: Patent with antegrade flow. Left internal carotid artery: Advanced calcified and soft plaque at the carotid bifurcation and the left internal carotid artery with occlusion at the initiation of the case. Distal segment is patent, which was proven after crossing of the proximal occlusion. Left MCA: M1 occlusion secondary to thrombus was confirmed on the initial angiogram. Left ACA: Occlusion extended into the left A1 segment,  compatible with a T occlusion Final images: After successful thrombectomy, there is restoration of TICI3 flow of the left hemisphere. Restoration of flow through the left A1 segment. Residual high-grade stenosis at the left ICA origin which was recalcitrant to multiple balloon angioplasty up to 5 mm with a cutting balloon technique. Dense plaque at the left ICA origin as well as the geometry of the ICA origin precluded placement of the selected stent. Final image demonstrates flow through the left ICA and left MCA territory PROCEDURE: Cervical cerebral angiogram with left MCA thrombectomy with a combined aspiration and solitaire technique. 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 Restoration of TICI 3 flow was achieved with 1 pass of solitaire device and local aspiration, and flow through the left ICA occlusion was re-established by balloon angioplasty. Dense calcified plaque and the geometry of the proximal left ICA precluded placement of the selected Exact stent, and the patient will remain on medical therapy. Angiogram of the right carotid system demonstrates high-grade stenosis of proximal right ICA secondary to advanced atherosclerotic changes. Although the angiographic measurement of  the string sign is 73% stenosis by NASCET criteria, the degree of stenosis is favored to be greater, as the distal vessel is compromised in diameter given the low flow. Signed, Yvone Neu. Loreta Ave DO [facility] Radiology PLAN: Noncontrast head CT postprocedure. ICU admission. Continue anti-platelet medication. Right common femoral 9 French sheath maintained which will be removed after 12 hours. Left common femoral 4 French sheath maintained for hemodynamic monitoring. Electronically Signed   By: Gilmer Mor D.O.   On: 02/16/2017 11:18    Ir Angio Intra Extracran Sel Com Carotid Innominate Uni R Mod Sed  Result Date: 02/16/2017 INDICATION: 61 year old male with acute right-sided weakness secondary to acute left hemisphere stroke. CT demonstrates left sided emergent large vessel occlusion of the M1 segment. Last known well 11:30 p.m., and within tPA time frame. IV tPA administered, and given his baseline function, angiogram for mechanical thrombectomy was pursued. CTA demonstrates left ICA occlusion. 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 COMPLICATIONS: None TECHNIQUE: Informed written consent was obtained from the patient's family/wife after a thorough discussion of the procedural risks, benefits and alternatives. Specific risks discussed include: Bleeding, infection, contrast reaction, kidney injury/failure, need for further procedure/surgery, arterial injury or dissection, embolization to new territory, intracranial hemorrhage (10-15% risk), neurologic deterioration, cardiopulmonary collapse, death. All questions were addressed. Maximal Sterile Barrier Technique was utilized including during the procedure including caps, mask, sterile gowns, sterile gloves, sterile drape, hand hygiene and skin antiseptic. A timeout was performed prior to the initiation of the procedure. Ultrasound survey of the left inguinal region was performed with images stored  and sent to PACs. A micropuncture needle was used access the left common femoral artery under ultrasound. With excellent arterial blood flow returned, an .018 micro wire was passed through the needle, observed to enter the abdominal aorta under fluoroscopy. The needle was removed, and a micropuncture sheath was placed over the wire. The inner dilator and wire were removed, and an 035 Bentson wire was advanced under fluoroscopy into the abdominal aorta. The sheath was removed and a standard 4 [place] vascular sheath was placed for hemodynamic monitoring. The dilator was removed and the sheath was flushed. Ultrasound survey of the right inguinal region was performed with images stored and sent to PACs. A micropuncture needle was used access the right common femoral artery under ultrasound. With excellent arterial blood flow returned, an .018 micro wire was passed through the needle, observed to enter the abdominal aorta under fluoroscopy. The needle was removed, and a micropuncture sheath was placed over the wire. The inner dilator and wire were removed, and an 035 Bentson wire was advanced under fluoroscopy into the abdominal aorta. The sheath was removed and a standard 5 [place] vascular sheath was placed for hemodynamic monitoring. The dilator was removed and the sheath was flushed. A 64F JB-1 diagnostic catheter was advanced over the wire to the proximal descending thoracic aorta. Wire was then removed. Double flush of the catheter was performed. Catheter was then used to select the right common carotid artery.  Angiogram was performed of the right-sided cervical and cerebral vasculature. Catheter was then withdrawn into the aortic arch, and the origin of the left common carotid artery was selected. Road map angiogram was performed. Standard Glidewire was used to advanced the catheter into the left common carotid artery. Formal angiogram was then performed, confirming the left common carotid artery occlusion and left  MCA occlusion. The Glidewire was then used to advance the JB 1 catheter into the external carotid artery branches. Glidewire was then exchanged for a Corporate investment banker. Exchange length Rosen wire was then passed through the diagnostic catheter to the distal common carotid artery and the diagnostic catheter was removed. The 5 French sheath was removed and exchanged for 8 French 55 centimeter BrightTip sheath. Sheath was flushed and attached to pressurized and heparinized saline bag for constant forward flow. Then an 8 [place], 85 cm Flowgate balloon tip catheter was prepared on the back table with inflation of the balloon with 50/50 concentration of dilute contrast. The balloon catheter was then advanced over the wire, positioned into the distal common carotid artery. Copious back flush was performed and the balloon catheter was attached to heparinized and pressurized saline bag for forward flow. Rapid transit microcatheter and soft tip Transcend wire were then used to probe the origin of the common carotid artery. The micro wiring catheter combination were successful with crossing the acute occlusion, with the wire catheter combination place distally into the left ICA. Wire was removed and back flow blood was confirmed to confirm luminal position. Small contrast injection through the microcatheter was performed. Exchange length Transcend wire was then placed through the microcatheter, it and a rapid transit was removed over the wire. Balloon angioplasty was then performed at the left ICA occlusion with a 4mm x 30mm Via-Trak rapid exchange angioplasty balloon. Given the recalcitrant stenosis, a 5mm x 30mm balloon was selected with balloon angioplasty to 7 atmosphere. The balloon was then removed, and Provue18 catheter was advanced over the Transcend wire. Transcend wire was removed and a synchro soft micro wire was selected. Micro wire was then advanced into the MCA. The micro wire was then carefully advanced through the  occluded segment. Microcatheter would not pass beyond the ophthalmic segment, likely secondary to the incomplete support system with the balloon catheter below the ICA origin. The exchange length Transcend wire was then placed through the microcatheter, and the microcatheter removed. An intermediate CAT 6 was selected, placed over the micro wire into the distal common carotid artery. The Provue 18 catheter was then advanced through the intermediate catheter while observing the distal aspect of the wire. The combination of the microcatheter and the intermediate catheter were successful in navigating to the skullbase through the persisting stenosis at the right ICA origin. Single attempt a passing the balloon catheter into the ICA was unsuccessful. Tip of the balloon catheter remained in the common carotid artery. The extra support from the intermediate catheter was useful in navigating the micro wire and microcatheter through the occlusion of the MCA into parietal branch. Micro wire was then removed. Blood was then aspirated through the hub of the microcatheter, and a gentle contrast injection was performed confirming intraluminal position. A rotating hemostatic valve was then attached to the back end of the microcatheter, and a pressurized and heparinized saline bag was attached to the catheter. 4 x 40 solitaire device was then selected. Back flush was achieved at the rotating hemostatic valve, and then the device was gently advanced through the microcatheter to the  distal end. The retriever was then unsheathed by withdrawing the microcatheter under fluoroscopy. Once the retriever was completely unsheathed, control angiogram was performed from the intermediate catheter. 3 minutes of the stent intercolation. Intermediate catheter was then advanced through the ophthalmic segment into the proximal MCA at the proximal aspect of the solitaire. Constant aspiration was then performed at the tip of the intermediate catheter  as the retriever was gently and slowly withdrawn with fluoroscopic observation. Once the retriever was entirely removed from the system, free aspiration was confirmed at the hub of the intermediate catheter, with free blood return confirmed. Significant thrombus was retrieved from the solitaire device. Repeat angiogram was then performed through the intermediate catheter confirming restoration of TICI3 flow. The intermediate catheter was then withdrawn into the cervical ICA, in preparation to address the left ICA stenosis with anticipated stent placement. Exchange length Transcend wire was placed through the intermediate catheter which was then removed. Once the exchange length trans and wire was in place, a control angiogram was performed, and there was attempted placement of our selected 8-6 x 40mm Exact stent. The stent would not pass through the lesion at the proximal common carotid artery. Stent was removed from the wire and repeat balloon angioplasty with 5 mm balloon was performed. Again, the stent would not pass through the lesion. Stent was removed from the wire, and a combination of 018 SV8 wire and the rapid transit were used to place the 018 buddy wire adjacent to the 0.014 Transcend wire. This was successful in straightening the carotid tortuosity, however, when there was attempt made to pass the stent system with 2 wire in place, the lumen on the flow gate catheter would not accept the buddy wire. The stent system was then removed, and a final balloon angioplasty was performed with 5 mm diameter balloon and the 018 SV 8 wire has a modified cutting balloon technique. Eight atmospheric was achieved (nominal diameter). Final attempt at passing the stent across the lesion was unsuccessful, and attempted stenting was abandoned after discussion with Dr. Amada Jupiter of Stroke Neurology. Final control angiogram of the left carotid system was performed. Control angiogram was performed at the right common  femoral artery puncture site, after the 55 centimeter 8 French sheath was exchanged for a standard 9 French sheath at the common femoral artery puncture site. Patient tolerated the procedure well and remained hemodynamically stable throughout. No complications were encountered. Estimated blood loss approximately 150 cc. FINDINGS: Ultrasound survey of the left common femoral artery demonstrates wide patency. Ultrasound survey of the right common femoral artery demonstrates wide patency. Initial images: Right common carotid artery:  Normal course caliber and contour. Right external carotid artery: Patent with antegrade flow. Right internal carotid artery: Significant stenosis at the proximal right internal carotid artery with a string sign. The percent stenosis estimated greater than 73% fine ask at criteria, however, this is favored to be greater given there is decreased caliber of the distal ICA secondary to decreased flow. Right MCA: M1 segment patent. The arterial, capillary/ parenchymal, and venous phase are delayed given the significant stenosis proximally. Contralateral filling of the left anterior cerebral artery with patent anterior communicating artery and large right A1 segment. Right ACA: Large right A1 segment with perfusion of left and right ACA territory. Left common carotid artery: Left common carotid artery origin is a common or drain from the base of the innominate artery. Mild plaque at the origin. Unremarkable course caliber and contour of the common carotid artery. Left external  carotid artery: Patent with antegrade flow. Left internal carotid artery: Advanced calcified and soft plaque at the carotid bifurcation and the left internal carotid artery with occlusion at the initiation of the case. Distal segment is patent, which was proven after crossing of the proximal occlusion. Left MCA: M1 occlusion secondary to thrombus was confirmed on the initial angiogram. Left ACA: Occlusion extended into the  left A1 segment, compatible with a T occlusion Final images: After successful thrombectomy, there is restoration of TICI3 flow of the left hemisphere. Restoration of flow through the left A1 segment. Residual high-grade stenosis at the left ICA origin which was recalcitrant to multiple balloon angioplasty up to 5 mm with a cutting balloon technique. Dense plaque at the left ICA origin as well as the geometry of the ICA origin precluded placement of the selected stent. Final image demonstrates flow through the left ICA and left MCA territory PROCEDURE: Cervical cerebral angiogram with left MCA thrombectomy with a combined aspiration and solitaire technique. 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 Restoration of TICI 3 flow was achieved with 1 pass of solitaire device and local aspiration, and flow through the left ICA occlusion was re-established by balloon angioplasty. Dense calcified plaque and the geometry of the proximal left ICA precluded placement of the selected Exact stent, and the patient will remain on medical therapy. Angiogram of the right carotid system demonstrates high-grade stenosis of proximal right ICA secondary to advanced atherosclerotic changes. Although the angiographic measurement of the string sign is 73% stenosis by NASCET criteria, the degree of stenosis is favored to be greater, as the distal vessel is compromised in diameter given the low flow. Signed, Yvone Neu. Loreta Ave DO [facility] Radiology PLAN: Noncontrast head CT postprocedure. ICU admission. Continue anti-platelet medication. Right common femoral 9 French sheath maintained which will be removed after 12 hours. Left common femoral 4 French sheath maintained for hemodynamic monitoring. Electronically Signed   By: Gilmer Mor D.O.   On:  02/16/2017 11:18    Labs:  CBC:  Recent Labs  02/16/17 0038 02/16/17 0050 02/17/17 0233  WBC 6.6  --  7.0  HGB 13.2 13.3 10.5*  HCT 38.2* 39.0 32.0*  PLT 207  --  140*    COAGS:  Recent Labs  02/16/17 0038  INR 0.97  APTT 22*    BMP:  Recent Labs  02/16/17 0038 02/16/17 0050 02/17/17 0233  NA 136 138 140  K 3.7 3.6 3.6  CL 102 101 105  CO2 23  --  26  GLUCOSE 206* 207* 107*  BUN 22* 24* 8  CALCIUM 9.3  --  8.9  CREATININE 0.97 1.00 0.71  GFRNONAA >60  --  >60  GFRAA >60  --  >60    LIVER FUNCTION TESTS:  Recent Labs  02/16/17 0038  BILITOT 0.6  AST 30  ALT 29  ALKPHOS 33*  PROT 6.9  ALBUMIN 4.4    Assessment and Plan: 1. L ICA CVA, s/p IR PERCUTANEOUS ART THROMBECTOMY/INFUSION INTRACRANIAL INC DIAG ANGIO  -he is following commands today and is able to move all 4 extremities with the right side being significantly weaker. -stick sites are c/d/I -further care by neuro  Electronically Signed: Jadon Ressler E 02/17/2017, 11:29 AM   I spent a total of 15 Minutes at the the patient's bedside AND on the patient's hospital floor  or unit, greater than 50% of which was counseling/coordinating care for CVA

## 2017-02-18 ENCOUNTER — Inpatient Hospital Stay (HOSPITAL_COMMUNITY): Payer: Managed Care, Other (non HMO)

## 2017-02-18 DIAGNOSIS — I6789 Other cerebrovascular disease: Secondary | ICD-10-CM

## 2017-02-18 DIAGNOSIS — I63032 Cerebral infarction due to thrombosis of left carotid artery: Secondary | ICD-10-CM

## 2017-02-18 LAB — VAS US CAROTID
LEFT ECA DIAS: -24 cm/s
LEFT VERTEBRAL DIAS: -9 cm/s
Left CCA dist dias: -17 cm/s
Left CCA dist sys: -93 cm/s
Left CCA prox dias: 11 cm/s
Left CCA prox sys: 103 cm/s
RCCAPDIAS: -16 cm/s
RCCAPSYS: -82 cm/s
RIGHT ECA DIAS: -35 cm/s
RIGHT VERTEBRAL DIAS: -17 cm/s
Right cca dist sys: -210 cm/s

## 2017-02-18 LAB — GLUCOSE, CAPILLARY
GLUCOSE-CAPILLARY: 159 mg/dL — AB (ref 65–99)
GLUCOSE-CAPILLARY: 122 mg/dL — AB (ref 65–99)
Glucose-Capillary: 124 mg/dL — ABNORMAL HIGH (ref 65–99)
Glucose-Capillary: 126 mg/dL — ABNORMAL HIGH (ref 65–99)
Glucose-Capillary: 114 mg/dL — ABNORMAL HIGH (ref 65–99)

## 2017-02-18 LAB — BASIC METABOLIC PANEL
Anion gap: 9 (ref 5–15)
BUN: 9 mg/dL (ref 6–20)
CO2: 26 mmol/L (ref 22–32)
CREATININE: 0.73 mg/dL (ref 0.61–1.24)
Calcium: 9.5 mg/dL (ref 8.9–10.3)
Chloride: 104 mmol/L (ref 101–111)
GFR calc Af Amer: >60 mL/min (ref >60)
GFR calc non Af Amer: >60 mL/min (ref >60)
Glucose, Bld: 120 mg/dL — ABNORMAL HIGH (ref 65–99)
Potassium: 3.5 mmol/L (ref 3.5–5.1)
Sodium: 139 mmol/L (ref 135–145)

## 2017-02-18 LAB — CBC
HEMATOCRIT: 33.7 % — AB (ref 39.0–52.0)
Hemoglobin: 11.4 g/dL — ABNORMAL LOW (ref 13.0–17.0)
MCH: 30.6 pg (ref 26.0–34.0)
MCHC: 33.8 g/dL (ref 30.0–36.0)
MCV: 90.3 fL (ref 78.0–100.0)
Platelets: 154 10*3/uL (ref 150–400)
RBC: 3.73 MIL/uL — ABNORMAL LOW (ref 4.22–5.81)
RDW: 12.7 % (ref 11.5–15.5)
WBC: 9.3 10*3/uL (ref 4.0–10.5)

## 2017-02-18 LAB — MAGNESIUM: Magnesium: 2 mg/dL (ref 1.7–2.4)

## 2017-02-18 LAB — PHOSPHORUS: PHOSPHORUS: 3.2 mg/dL (ref 2.5–4.6)

## 2017-02-18 LAB — ECHOCARDIOGRAM COMPLETE
HEIGHTINCHES: 70 in
Weight: 3439.18 oz

## 2017-02-18 MED ORDER — CLOPIDOGREL BISULFATE 75 MG PO TABS
75.0000 mg | ORAL_TABLET | Freq: Every day | ORAL | Status: DC
  Administered 2017-02-19 – 2017-02-22 (×4): 75 mg via ORAL
  Filled 2017-02-18 (×5): qty 1

## 2017-02-18 MED ORDER — ROSUVASTATIN CALCIUM 20 MG PO TABS
40.0000 mg | ORAL_TABLET | Freq: Every day | ORAL | Status: DC
  Administered 2017-02-19 – 2017-02-21 (×3): 40 mg via ORAL
  Filled 2017-02-18 (×6): qty 2

## 2017-02-18 MED ORDER — AMLODIPINE BESYLATE 10 MG PO TABS
10.0000 mg | ORAL_TABLET | Freq: Every day | ORAL | Status: DC
  Administered 2017-02-19 – 2017-02-22 (×4): 10 mg via ORAL
  Filled 2017-02-18 (×5): qty 1

## 2017-02-18 MED ORDER — HYDROCHLOROTHIAZIDE 25 MG PO TABS
25.0000 mg | ORAL_TABLET | Freq: Every day | ORAL | Status: DC
  Administered 2017-02-19 – 2017-02-22 (×4): 25 mg via ORAL
  Filled 2017-02-18 (×5): qty 1

## 2017-02-18 MED ORDER — TAMSULOSIN HCL 0.4 MG PO CAPS
0.4000 mg | ORAL_CAPSULE | Freq: Every day | ORAL | Status: DC
Start: 2017-02-19 — End: 2017-02-22
  Administered 2017-02-19 – 2017-02-22 (×4): 0.4 mg via ORAL
  Filled 2017-02-18 (×5): qty 1

## 2017-02-18 MED ORDER — ACETAMINOPHEN 325 MG PO TABS
650.0000 mg | ORAL_TABLET | Freq: Four times a day (QID) | ORAL | Status: DC | PRN
  Filled 2017-02-18: qty 2

## 2017-02-18 MED ORDER — ASPIRIN EC 325 MG PO TBEC
325.0000 mg | DELAYED_RELEASE_TABLET | Freq: Every day | ORAL | Status: DC
  Administered 2017-02-19 – 2017-02-22 (×4): 325 mg via ORAL
  Filled 2017-02-18 (×4): qty 1

## 2017-02-18 MED ORDER — LOSARTAN POTASSIUM 50 MG PO TABS
25.0000 mg | ORAL_TABLET | Freq: Every day | ORAL | Status: DC
  Administered 2017-02-19 – 2017-02-22 (×4): 25 mg via ORAL
  Filled 2017-02-18 (×5): qty 1

## 2017-02-18 MED ORDER — PANTOPRAZOLE SODIUM 40 MG PO TBEC
40.0000 mg | DELAYED_RELEASE_TABLET | Freq: Every day | ORAL | Status: DC
  Administered 2017-02-18 – 2017-02-21 (×4): 40 mg via ORAL
  Filled 2017-02-18 (×4): qty 1

## 2017-02-18 MED ORDER — HEPARIN SODIUM (PORCINE) 5000 UNIT/ML IJ SOLN
5000.0000 [IU] | Freq: Three times a day (TID) | INTRAMUSCULAR | Status: DC
  Administered 2017-02-18 – 2017-02-22 (×13): 5000 [IU] via SUBCUTANEOUS
  Filled 2017-02-18 (×13): qty 1

## 2017-02-18 MED ORDER — FLUOXETINE HCL 20 MG PO CAPS
20.0000 mg | ORAL_CAPSULE | Freq: Every day | ORAL | Status: DC
  Administered 2017-02-19 – 2017-02-22 (×4): 20 mg via ORAL
  Filled 2017-02-18 (×5): qty 1

## 2017-02-18 MED ORDER — CLOPIDOGREL BISULFATE 75 MG PO TABS
75.0000 mg | ORAL_TABLET | Freq: Every day | ORAL | Status: DC
  Administered 2017-02-18: 75 mg via ORAL
  Filled 2017-02-18: qty 1

## 2017-02-18 MED ORDER — INSULIN ASPART 100 UNIT/ML ~~LOC~~ SOLN
0.0000 [IU] | Freq: Three times a day (TID) | SUBCUTANEOUS | Status: DC
  Administered 2017-02-19 – 2017-02-20 (×2): 1 [IU] via SUBCUTANEOUS
  Administered 2017-02-20 – 2017-02-21 (×2): 2 [IU] via SUBCUTANEOUS
  Administered 2017-02-22 (×2): 1 [IU] via SUBCUTANEOUS

## 2017-02-18 MED ORDER — SODIUM CHLORIDE 0.9 % IV SOLN
INTRAVENOUS | Status: DC
  Administered 2017-02-18: 16:00:00 via INTRAVENOUS

## 2017-02-18 NOTE — Progress Notes (Signed)
STROKE TEAM PROGRESS NOTE   SUBJECTIVE (INTERVAL HISTORY) His RN is at the bedside.  He was extubated yesterday and is doing well. BP on the goal. Had MRI and MRA showed left MCA patchy infarct, left ICA not visualized but reconstituted at cavernous segment.  Plan to transfer to the floor today   OBJECTIVE Temp:  [98.5 F (36.9 C)-99.6 F (37.6 C)] 99.6 F (37.6 C) (04/15 0747) Pulse Rate:  [62-81] 70 (04/15 0800) Cardiac Rhythm: Normal sinus rhythm (04/15 0800) Resp:  [10-26] 14 (04/15 0800) BP: (132-162)/(53-78) 136/56 (04/15 0800) SpO2:  [92 %-100 %] 98 % (04/15 0800) Weight:  [97.5 kg (214 lb 15.2 oz)] 97.5 kg (214 lb 15.2 oz) (04/15 0500)   Recent Labs Lab 02/17/17 1556 02/17/17 1945 02/17/17 2308 02/18/17 0340 02/18/17 0747  GLUCAP 190* 133* 121* 124* 126*    Recent Labs Lab 02/16/17 0038 02/16/17 0050 02/17/17 0233 02/18/17 0223  NA 136 138 140 139  K 3.7 3.6 3.6 3.5  CL 102 101 105 104  CO2 23  --  26 26  GLUCOSE 206* 207* 107* 120*  BUN 22* 24* 8 9  CREATININE 0.97 1.00 0.71 0.73  CALCIUM 9.3  --  8.9 9.5  MG  --   --  2.0 2.0  PHOS  --   --  3.2 3.2    Recent Labs Lab 02/16/17 0038  AST 30  ALT 29  ALKPHOS 33*  BILITOT 0.6  PROT 6.9  ALBUMIN 4.4    Recent Labs Lab 02/16/17 0038 02/16/17 0050 02/17/17 0233 02/18/17 0223  WBC 6.6  --  7.0 9.3  NEUTROABS 3.3  --   --   --   HGB 13.2 13.3 10.5* 11.4*  HCT 38.2* 39.0 32.0* 33.7*  MCV 91.0  --  92.2 90.3  PLT 207  --  140* 154   No results for input(s): CKTOTAL, CKMB, CKMBINDEX, TROPONINI in the last 168 hours.  Recent Labs  02/16/17 0038  LABPROT 12.9  INR 0.97   No results for input(s): COLORURINE, LABSPEC, PHURINE, GLUCOSEU, HGBUR, BILIRUBINUR, KETONESUR, PROTEINUR, UROBILINOGEN, NITRITE, LEUKOCYTESUR in the last 72 hours.  Invalid input(s): APPERANCEUR     Component Value Date/Time   CHOL 135 02/16/2017 0612   TRIG 144 02/16/2017 0612   HDL 28 (L) 02/16/2017 0612   CHOLHDL  4.8 02/16/2017 0612   VLDL 29 02/16/2017 0612   LDLCALC 78 02/16/2017 0612   Lab Results  Component Value Date   HGBA1C 7.0 (H) 02/16/2017   No results found for: LABOPIA, COCAINSCRNUR, LABBENZ, AMPHETMU, THCU, LABBARB  No results for input(s): ETH in the last 168 hours.   Imaging  I have personally reviewed the radiological images below and agree with the radiology interpretations.  Ct Angio Head and neck W Or Wo Contrast 02/16/2017 IMPRESSION: 1. 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 Distal collateral flow within the left MCA distribution persists and is relatively symmetric to the right side. 2. 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 3. At least 90% stenosis at the origin of the right internal carotid artery secondary to mixed calcified and noncalcified plaque.   Ct Head Wo Contrast 02/16/2017 IMPRESSION: 1. Status post stroke intervention with resolution of previously seen hyperdense thrombus in the left middle cerebral artery. 2. No acute hemorrhage or cytotoxic edema.   Mri brain  and Mra Head Wo Contrast 02/17/2017 1. Multiple areas of acute or early subacute ischemia within the supratentorial left hemisphere, with the largest areas located in the left basal ganglia and left temporal lobe. There is associated cytotoxic edema without midline shift herniation or other significant mass effect.  2. Petechial hemorrhage in the posterior left temporal lobe and peripheral left basal ganglia without space-occupying hematoma. Short interval follow-up head CT is recommended.  3. No flow related enhancement within the visualized left internal carotid artery and diminished enhancement in the right internal carotid artery. This may be due to slow flow, though recurrent occlusion of the treated left internal carotid artery  would be difficult to exclude. CTA of the neck may be helpful to assess possible residual or recurrent occlusion/stenosis.  4. Normal flow related enhancement of the left middle cerebral artery.   Cerebral angio 02/16/2017 Status post treatment of tandem occlusion of left ICA and MCA with balloon angioplasty of advanced proximal ICA disease and mechanical thrombectomy of acute left M1 occlusion. Restoration of TICI 3 flow was achieved with 1 pass of solitaire device and local aspiration, and flow through the left ICA occlusion was re-established by balloon angioplasty. Dense calcified plaque and the geometry of the proximal left ICA precluded placement of the selected Exact stent, and the patient will remain on medical therapy. Angiogram of the right carotid system demonstrates high-grade stenosis of proximal right ICA secondary to advanced atherosclerotic changes. Although the angiographic measurement of the string sign is 73% stenosis by NASCET criteria, the degree of stenosis is favored to be greater, as the distal vessel is compromised in diameter given the low flow.    Ct Head Code Stroke W/o Cm 02/16/2017 IMPRESSION: 1. Hyper dense left middle cerebral artery, likely indicating the presence of acute thrombus. 2. ASPECTS is 10. 3. No intracranial hemorrhage.   CXR  02/17/2017 1. Stable support apparatus. 2. Improved aeration to the lungs with persistent RIGHT basilar atelectasis.   TTE - pending   CUS  02/17/2017 There is 80-99% right ICA stenosis, highest end of scale.  The left ICA appears occluded.  Vertebral artery flow is antegrade.    PHYSICAL EXAM  Temp:  [98.5 F (36.9 C)-99.6 F (37.6 C)] 99.6 F (37.6 C) (04/15 0747) Pulse Rate:  [62-81] 70 (04/15 0800) Resp:  [10-26] 14 (04/15 0800) BP: (132-162)/(53-78) 136/56 (04/15 0800) SpO2:  [92 %-100 %] 98 % (04/15 0800) Weight:  [97.5 kg (214 lb 15.2 oz)] 97.5 kg (214 lb 15.2 oz) (04/15 0500)  General - Well nourished, well  developed, intubated and on low dose sedation.  Ophthalmologic - fundi not visualized  Cardiovascular - Regular rate and rhythm.  Neuro - intubated on low dose sedation, able to open eyes on voice, following central and peripheral commands. PERRL, EOMI, blinking to visual threat on the left but not on the right. Facial symmetry difficult to test due to ET. LUE and LLE purposeful movement and against gravity, RUE proximal 3-/5, finger movement 0/5, RLE 2/5 proximal and 0/5 distally. DTR 1+ and no babinski bilaterally. Sensation, coordination not cooperative. Gait not tested.   ASSESSMENT/PLAN [name] is a 61 y.o. male with history of HTN, HLD admitted for aphasia and right side weakness. Received tPA and endovascular intervention.    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 Failed attempt left ICA stenting. Embolic likely secondary to large vessel disease source given b/l  ICA high grade stenosis with severely calcified plaques  MRI left MCA patchy infarcts with small hemorrhagic transformation  MRA left ICA not visualized but reconstituted at cavernous portion, left MCA patent  CTA head and neck showed left ICA and left M1 occlusion, right ICA proximal at least 90% stenosis  2D Echo  pending  CUS - There is 80-99% right ICA stenosis.  The left ICA appears occluded.  LDL 78  HgbA1c 7.0  Heparin subq for VTE prophylaxis  Diet Carb Modified Fluid consistency: Thin; Room service appropriate? Yes   No antithrombotic prior to admission, now on ASA after 24h tPA. Not able to do DAPT currently as he has small hemorrhagic transformation.  Ongoing aggressive stroke risk factor management  Therapy recommendations:  CIR recommended. Rehab MD consult pending.  Disposition:  Pending  b/l ICA stenosis  Left ICA occlusion on CTA but now likely high grade stenosis post intervention  MRA no  visualization of left ICA but reconstituted at cavernous portion  CUS - for further evaluation of proximal left ICA -> The left ICA appears occluded.  Failed left ICA stenting  Will need VVS consult for b/l ICA stenosis once extubated and stabilized  BP goal 130-160 post procedure  Respiratory failure  S/P intubation for IR procedure - Extubated 02/17/2017  CXR much improved   Diabetes, new diagnosis  HgbA1c 7.0 goal < 7.0  Uncontrolled  hyperglycemia  CBG monitoring  SSI  Hypertension  Home meds:   Amlodipine and cozaar BP goal 130-160 due to b/l ICA occlusion/stenosis  Stable   s/p albumin 4 doses  Consider IVF if needed  BP 136/56  Hyperlipidemia  Home meds:  crestor 40  LDL 78, goal < 70  Resumed crestor   Continue statin at discharge  Other Stroke Risk Factors  Advanced age  Obesity, Body mass index is 30.84 kg/m.   Other Active Problems  Elevated BUN -> resolved  Mild anemia - 11.4 / 33.7  Hospital day # 2 Plan to transfer to the floor today. Add Plavix to aspirin. Transfer to inpatient rehabilitation over the next few days when bed available. Elective right carotid artery angioplasty stenting at the end of rehabilitation stay. Discussed plan with patient and he is agreeable. Answered questions. Greater than 50% time during this 35 minute visit was spent on counseling and coordination of care about his bilateral carotid stenosis, stroke, planning treatment and answering questions  Delia Heady, MD Medical Director [facility] Pager: [phone] 02/18/2017 1:20 PM  To contact Stroke Continuity provider, please refer to [email or web address]. After hours, contact General Neurology

## 2017-02-18 NOTE — Evaluation (Signed)
Occupational Therapy Evaluation Patient Details Name: Erik [name] MRN: [id number] DOB: 04/07/1956 Today's Date: 02/18/2017    History of Present Illness 61 y.o. male admitted to [facility] on [facility]/16/17 for L MCA CVA s/p tPA and percutaneous thrombectomy.  Bil high grade ICA stenosis.  Pt with significant PMhx of HLD, bil knee surgery, and R RTC repair.   Clinical Impression   This 61 yo male admitted with above presents to acute OT with deficits below (see OT problem list) thus affecting his PLOF of totally independent with basic ADLs, IADLs, and working full time. He will benefit from acute OT with follow up OT on CIR to get back to PLOF.    Follow Up Recommendations  CIR    Equipment Recommendations  Other (comment) (TBD at next venue)    Recommendations for Other Services Rehab consult     Precautions / Restrictions Precautions Precautions: Fall Precaution Comments: due to right sided weakness Restrictions Weight Bearing Restrictions: No      Mobility Bed Mobility Overal bed mobility: Needs Assistance Bed Mobility: Supine to Sit     Supine to sit: HOB elevated;Min guard     General bed mobility comments: moves quickly  Transfers Overall transfer level: Needs assistance Equipment used: 2 person hand held assist Transfers: Sit to/from [employer] Sit to Stand: Min assist Stand pivot transfers: Min assist            Balance Overall balance assessment: Needs assistance Sitting-balance support: No upper extremity supported;Feet supported Sitting balance-Leahy Scale: Fair Sitting balance - Comments: leans posteriorly with crossin his legs to work on donning socks   Standing balance support: Bilateral upper extremity supported Standing balance-Leahy Scale: Poor Standing balance comment: reliant on having both hands on my arms for stepping                           ADL either performed or assessed with clinical judgement   ADL Overall  ADL's : Needs assistance/impaired Eating/Feeding: Supervision/ safety;Set up;Sitting   Grooming: Minimal assistance;Sitting   Upper Body Bathing: Minimal assistance;Sitting   Lower Body Bathing: Moderate assistance (min A sit<>stand)   Upper Body Dressing : Moderate assistance;Sitting   Lower Body Dressing: Moderate assistance (min A sit<>stand)   Toilet Transfer: Minimal assistance;Stand-pivot   Toileting- Clothing Manipulation and Hygiene: Maximal assistance (min A sit<>stand)               Vision Baseline Vision/History: Wears glasses Wears Glasses: Reading only Patient Visual Report: No change from baseline Vision Assessment?: Yes Eye Alignment: Within Functional Limits Ocular Range of Motion: Restricted on the right (with saccades, but not seen with tracking) Alignment/Gaze Preference: Within Defined Limits Tracking/Visual Pursuits: Decreased smoothness of horizontal tracking (right) Saccades: Additional eye shifts occurred during testing;Additional head turns occurred during testing (difficuly to right) Convergence: Within functional limits Visual Fields: No apparent deficits            Pertinent Vitals/Pain Pain Assessment: No/denies pain     Hand Dominance Right   Extremity/Trunk Assessment Upper Extremity Assessment Upper Extremity Assessment: RUE deficits/detail RUE Deficits / Details: slower compared to LUE with increased effort to do same task with it as LUE; can oppose digits with increased time as well as finger to nose RUE Sensation: decreased proprioception RUE Coordination: decreased fine motor;decreased gross motor           Communication Communication Communication: Expressive difficulties   Cognition Arousal/Alertness: Awake/alert Behavior During Therapy:  WFL for tasks assessed/performed Overall Cognitive Status: Impaired/Different from baseline Area of Impairment: Problem solving                       Following Commands:  Follows one step commands with increased time       General Comments: Pt is slow to answer questions and at time innacurate with his answers.       Exercises Other Exercises Other Exercises: educated pt and wife (also gave handout) for pt do work on folding washcloths, finger<>nose, composite flexion/extension of digits, thumb>finger opposition, and opening and closing grooming items        Home Living Family/patient expects to be discharged to:: Inpatient rehab Living Arrangements: Spouse/significant other;Children;Other relatives Available Help at Discharge: Family;Available PRN/intermittently Type of Home: House Home Access: Stairs to enter [employer] of Steps: 6 Entrance Stairs-Rails: Left;Right Home Layout: Two level;Bed/bath upstairs;Full bath on main level Alternate Level Stairs-Number of Steps: flight Alternate Level Stairs-Rails: Right;Can reach both;Left Bathroom Shower/Tub: Walk-in shower;Other (comment);Door   Bathroom Toilet: Handicapped height     Home Equipment: None          Prior Functioning/Environment Level of Independence: Independent        Comments: drives, works full time in a Paramedic Problem List: Decreased strength;Decreased range of motion;Impaired balance (sitting and/or standing);Impaired vision/perception;Decreased coordination;Decreased cognition;Decreased safety awareness;Impaired UE functional use;Impaired tone      OT Treatment/Interventions: Self-care/ADL training;Therapeutic exercise;Therapeutic activities;Cognitive remediation/compensation;Visual/perceptual remediation/compensation;Patient/family education;DME and/or AE instruction;Balance training    OT Goals(Current goals can be found in the care plan section) Acute Rehab OT Goals Patient Stated Goal: to be able to walk again OT Goal Formulation: With patient/family Time For Goal Achievement: 03/04/17 Potential to Achieve Goals: Good  OT Frequency: Min  3X/week   Barriers to D/C: Decreased caregiver support             End of Session Equipment Utilized During Treatment: Gait belt Nurse Communication: Mobility status  Activity Tolerance: Patient tolerated treatment well Patient left: in chair;with chair alarm set;with call bell/phone within reach;with family/visitor present  OT Visit Diagnosis: Unsteadiness on feet (R26.81);Muscle weakness (generalized) (M62.81);Cognitive communication deficit (R41.841);Hemiplegia and hemiparesis Symptoms and signs involving cognitive functions: Cerebral infarction Hemiplegia - Right/Left: Right Hemiplegia - dominant/non-dominant: Dominant Hemiplegia - caused by: Cerebral infarction                Time: 1110-1142 OT Time Calculation (min): 32 min Charges:  OT General Charges $OT Visit: 1 Procedure OT Evaluation $OT Eval Moderate Complexity: 1 Procedure OT Treatments $Self Care/Home Management : 8-22 mins Ignacia Palma, OTR/L [phone] 02/18/2017

## 2017-02-18 NOTE — Progress Notes (Signed)
  Echocardiogram 2D Echocardiogram has been performed.  Erik [name] 02/18/2017, 3:21 PM

## 2017-02-19 ENCOUNTER — Encounter: Payer: Self-pay | Admitting: *Deleted

## 2017-02-19 ENCOUNTER — Encounter (HOSPITAL_COMMUNITY): Payer: Self-pay

## 2017-02-19 DIAGNOSIS — I6529 Occlusion and stenosis of unspecified carotid artery: Secondary | ICD-10-CM

## 2017-02-19 DIAGNOSIS — D62 Acute posthemorrhagic anemia: Secondary | ICD-10-CM

## 2017-02-19 DIAGNOSIS — I63239 Cerebral infarction due to unspecified occlusion or stenosis of unspecified carotid arteries: Secondary | ICD-10-CM

## 2017-02-19 DIAGNOSIS — E119 Type 2 diabetes mellitus without complications: Secondary | ICD-10-CM

## 2017-02-19 DIAGNOSIS — I69391 Dysphagia following cerebral infarction: Secondary | ICD-10-CM

## 2017-02-19 DIAGNOSIS — Z96653 Presence of artificial knee joint, bilateral: Secondary | ICD-10-CM

## 2017-02-19 DIAGNOSIS — N400 Enlarged prostate without lower urinary tract symptoms: Secondary | ICD-10-CM

## 2017-02-19 DIAGNOSIS — I10 Essential (primary) hypertension: Secondary | ICD-10-CM | POA: Diagnosis present

## 2017-02-19 DIAGNOSIS — R351 Nocturia: Secondary | ICD-10-CM | POA: Diagnosis present

## 2017-02-19 DIAGNOSIS — Z006 Encounter for examination for normal comparison and control in clinical research program: Secondary | ICD-10-CM

## 2017-02-19 DIAGNOSIS — I63031 Cerebral infarction due to thrombosis of right carotid artery: Secondary | ICD-10-CM

## 2017-02-19 DIAGNOSIS — I6521 Occlusion and stenosis of right carotid artery: Secondary | ICD-10-CM | POA: Diagnosis present

## 2017-02-19 DIAGNOSIS — I69991 Dysphagia following unspecified cerebrovascular disease: Secondary | ICD-10-CM

## 2017-02-19 DIAGNOSIS — Z96659 Presence of unspecified artificial knee joint: Secondary | ICD-10-CM

## 2017-02-19 LAB — BASIC METABOLIC PANEL
Anion gap: 11 (ref 5–15)
BUN: 15 mg/dL (ref 6–20)
CALCIUM: 9.2 mg/dL (ref 8.9–10.3)
CO2: 26 mmol/L (ref 22–32)
Chloride: 102 mmol/L (ref 101–111)
Creatinine, Ser: 0.76 mg/dL (ref 0.61–1.24)
GFR calc Af Amer: >60 mL/min (ref >60)
GFR calc non Af Amer: >60 mL/min (ref >60)
GLUCOSE: 112 mg/dL — AB (ref 65–99)
POTASSIUM: 3.9 mmol/L (ref 3.5–5.1)
Sodium: 139 mmol/L (ref 135–145)

## 2017-02-19 LAB — CBC
HEMATOCRIT: 35.9 % — AB (ref 39.0–52.0)
HEMOGLOBIN: 12.2 g/dL — AB (ref 13.0–17.0)
MCH: 30.6 pg (ref 26.0–34.0)
MCHC: 34 g/dL (ref 30.0–36.0)
MCV: 90 fL (ref 78.0–100.0)
Platelets: 151 10*3/uL (ref 150–400)
RBC: 3.99 MIL/uL — ABNORMAL LOW (ref 4.22–5.81)
RDW: 12.7 % (ref 11.5–15.5)
WBC: 7 10*3/uL (ref 4.0–10.5)

## 2017-02-19 LAB — GLUCOSE, CAPILLARY
GLUCOSE-CAPILLARY: 120 mg/dL — AB (ref 65–99)
GLUCOSE-CAPILLARY: 148 mg/dL — AB (ref 65–99)
Glucose-Capillary: 101 mg/dL — ABNORMAL HIGH (ref 65–99)
Glucose-Capillary: 97 mg/dL (ref 65–99)

## 2017-02-19 LAB — PHOSPHORUS: PHOSPHORUS: 3.4 mg/dL (ref 2.5–4.6)

## 2017-02-19 LAB — MAGNESIUM: MAGNESIUM: 2.1 mg/dL (ref 1.7–2.4)

## 2017-02-19 NOTE — NC FL2 (Addendum)
MEDICAID FL2 LEVEL OF CARE SCREENING TOOL     IDENTIFICATION  Patient Name: Erik [name] Birthdate: 19-Jul-1956 Sex: male Admission Date (Current Location): 02/16/2017  [facility] and [place] Number:  Chiropodist and Address:  [facility], [address], [place], [place] [address]      Provider Number: [id number]  Attending Physician Name and Address:  Micki Riley, MD  Relative Name and Phone Number:       Current Level of Care: Hospital Recommended Level of Care: Skilled Nursing Facility Prior Approval Number:    Date Approved/Denied:   PASRR Number:   [id number]  Discharge Plan: SNF    Current Diagnoses: Patient Active Problem List   Diagnosis Date Noted  . CVA (cerebral vascular accident) (HCC) 02/16/2017  . Anxiety 04/30/2015  . Benign fibroma of prostate 04/30/2015  . Clinical depression 04/30/2015  . Acid reflux 04/30/2015  . HLD (hyperlipidemia) 04/30/2015  . BP (high blood pressure) 04/30/2015  . Arthritis, degenerative 04/30/2015  . 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 Arthritis, septic, knee (HCC) 09/26/2012    Orientation RESPIRATION BLADDER Height & Weight     Self, Situation, Place  Normal Incontinent Weight: 215 lb 6.4 oz (97.7 kg) Height:   (177.8 cm)  BEHAVIORAL SYMPTOMS/MOOD NEUROLOGICAL BOWEL NUTRITION STATUS      Continent Diet (carb modified)  AMBULATORY STATUS COMMUNICATION OF NEEDS Skin   Extensive Assist Verbally Normal                       Personal Care Assistance Level of Assistance  Bathing, Dressing Bathing Assistance: Maximum assistance   Dressing Assistance: Maximum assistance     Functional Limitations Info   Ambulation    Extensive Assist      SPECIAL CARE FACTORS FREQUENCY  PT (By licensed PT), OT (By licensed OT)     PT Frequency: 5/wk OT Frequency: 5/wk            Contractures   Not present    Additional Factors Info   Code Status, Allergies, Psychotropic, Insulin Sliding Scale Code Status Info: FULL Allergies Info: Celecoxib, Shellfish-derived Products Psychotropic Info: prozac Insulin Sliding Scale Info: 3/day       Current Medications (02/19/2017):  This is the current hospital active medication list Current Facility-Administered Medications  Medication Dose Route Frequency Provider Last Rate Last Dose  . 0.9 %  sodium chloride infusion   Intravenous Continuous David L Rinehuls, PA-C 50 mL/hr at 02/18/17 1600    . acetaminophen (TYLENOL) tablet 650 mg  650 mg Oral Q6H PRN David L Rinehuls, PA-C      . amLODipine (NORVASC) tablet 10 mg  10 mg Oral Daily Micki Riley, MD   10 mg at 02/19/17 0901  . aspirin EC tablet 325 mg  325 mg Oral Daily David L Rinehuls, PA-C   325 mg at 02/19/17 0901  . clopidogrel (PLAVIX) tablet 75 mg  75 mg Oral Daily David L Rinehuls, PA-C   75 mg at 02/19/17 0901  . FLUoxetine (PROZAC) capsule 20 mg  20 mg Oral Daily Micki Riley, MD   20 mg at 02/19/17 0901  . heparin injection 5,000 Units  5,000 Units Subcutaneous Q8H David L Rinehuls, PA-C   5,000 Units at 02/19/17 0558  . hydrochlorothiazide (HYDRODIURIL) tablet 25 mg  25 mg Oral Daily Micki Riley, MD   25 mg at 02/19/17 0851  .  insulin aspart (novoLOG) injection 0-9 Units  0-9 Units Subcutaneous TID WC David L Rinehuls, PA-C      . losartan (COZAAR) tablet 25 mg  25 mg Oral Daily Micki Riley, MD   25 mg at 02/19/17 0901  . pantoprazole (PROTONIX) EC tablet 40 mg  40 mg Oral QHS Quenton Fetter, RPH   40 mg at 02/18/17 2153  . rosuvastatin (CRESTOR) tablet 40 mg  40 mg Oral q1800 David L Rinehuls, PA-C      . tamsulosin (FLOMAX) capsule 0.4 mg  0.4 mg Oral Daily Micki Riley, MD   0.4 mg at 02/19/17 0902     Discharge Medications: Please see discharge summary for a list of discharge medications.  Relevant Imaging Results:  Relevant Lab Results:   Additional Information SS#: [id number]  Burna Sis, LCSW  I have personally examined this patient, reviewed notes, independently viewed imaging studies, participated in medical decision making and plan of care.ROS completed by me personally and pertinent positives fully documented  I have made any additions or clarifications directly to the above note. Agree with note above.  Delia Heady, MD Medical Director [facility] Pager: [phone] 02/19/2017 3:47 PM

## 2017-02-19 NOTE — Progress Notes (Signed)
STROKE TEAM PROGRESS NOTE   SUBJECTIVE (INTERVAL HISTORY) Wife at the bedside. He is up in the chair. Dr. Pearlean Brownie discussed plan for IP rehab followed by stent placement post CIR.    OBJECTIVE Temp:  [98.4 F (36.9 C)-98.8 F (37.1 C)] 98.4 F (36.9 C) (04/16 0900) Pulse Rate:  [59-65] 63 (04/16 0900) Cardiac Rhythm: Normal sinus rhythm (04/16 0400) Resp:  [7-20] 18 (04/16 0900) BP: (131-149)/(69-82) 148/69 (04/16 0900) SpO2:  [94 %-98 %] 95 % (04/16 0900) Weight:  [97.7 kg (215 lb 6.4 oz)] 97.7 kg (215 lb 6.4 oz) (04/16 0500)   Recent Labs Lab 02/18/17 0747 02/18/17 1142 02/18/17 1652 02/18/17 2118 02/19/17 0608  GLUCAP 126* 159* 114* 122* 120*    Recent Labs Lab 02/16/17 0038 02/16/17 0050 02/17/17 0233 02/18/17 0223 02/19/17 0428  NA 136 138 140 139 139  K 3.7 3.6 3.6 3.5 3.9  CL 102 101 105 104 102  CO2 23  --  GLUCOSE 206* 207* 107* 120* 112*  BUN 22* 24* CREATININE 0.97 1.00 0.71 0.73 0.76  CALCIUM 9.3  --  8.9 9.5 9.2  MG  --   --  2.0 2.0 2.1  PHOS  --   --  3.2 3.2 3.4    Recent Labs Lab 02/16/17 0038  AST 30  ALT 29  ALKPHOS 33*  BILITOT 0.6  PROT 6.9  ALBUMIN 4.4    Recent Labs Lab 02/16/17 0038 02/16/17 0050 02/17/17 0233 02/18/17 0223 02/19/17 0428  WBC 6.6  --  7.0 9.3 7.0  NEUTROABS 3.3  --   --   --   --   HGB 13.2 13.3 10.5* 11.4* 12.2*  HCT 38.2* 39.0 32.0* 33.7* 35.9*  MCV 91.0  --  92.2 90.3 90.0  PLT 207  --  140* 154 151     PHYSICAL EXAM General - Well nourished, well developed,  Caucasian obese male not in distress. Ophthalmologic - fundi not visualized  Cardiovascular - Regular rate and rhythm.  Neuro - Awake alert expressive aphasia with hesitant speech but good comprehension and naming.PERRL, EOMI, blinking to visual threat on the left but not on the right. Facial symmetry difficult to test due to ET. LUE and LLE purposeful movement and against gravity, RUE proximal 3-/5, finger movement 0/5,  RLE 3/5 proximal and 2/5 distally. DTR 1+ and no babinski bilaterally. Sensation, coordination not cooperative. Gait not tested.    ASSESSMENT/PLAN [name] is a 61 y.o. male with history of HTN, HLD admitted for aphasia and right side weakness. Received tPA and endovascular intervention.    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 Failed attempt left ICA stenting. Infarct secondary to large vessel disease source given bilateral ICA high grade stenosis with severely calcified plaques  Code stroke CT hyperdense L MCA. Aspects 10.  CTA head and neck showed left ICA and left M1 occlusion, right ICA proximal at least 90% stenosis  Cerebral angio tandem L ICA and MCA occlusion with angioplasty and mechanical throbectomy. TICI 3. (solitaire 1 pass). High grade R ICA stenosis 73% with string sign. Attempted stent placement  MRI left MCA patchy infarcts with small hemorrhagic transformation  MRA left ICA not visualized but reconstituted at cavernous portion, left MCA patent  CUS - There is 80-99% right ICA stenosis.  The left ICA appears occluded.  2D Echo  EF 55-60%, no  SOE. Grade 2 DD  LDL 78  HgbA1c 7.0  Heparin subq for VTE prophylaxis  Diet Carb Modified Fluid consistency: Thin; Room service appropriate? Yes   No antithrombotic prior to admission, now on ASA and plavix given large vessel disease. Plan x 3 months then plavix alone. May be altered based on potential stent placement  Ongoing aggressive stroke risk factor management  Therapy recommendations:  CIR. medically ready for discharge, await bed.  Disposition:  Pending  b/l ICA stenosis  Left ICA occlusion on CTA but now likely high grade stenosis post intervention  MRA no visualization of left ICA but reconstituted at cavernous portion  CUS - left ICA appears occluded.  Failed left ICA stenting  Dr. Pearlean Brownie discussed with Dr.  Corliss Skains. Plan R ICA stent at the end of IP rehab prior to return home  BP goal < 180  Respiratory failure  S/P intubation for IR procedure - Extubated 02/17/2017  CXR much improved  Diabetes, new diagnosis  HgbA1c 7.0 goal < 7.0  Uncontrolled  hyperglycemia  CBG monitoring  SSI  Hypertension  Home meds:   Amlodipine and cozaar  BP goal < 180  Stable   s/p albumin 4 doses  Hyperlipidemia  Home meds:  crestor 40  LDL 78, goal < 70  Resumed crestor   Continue statin at discharge  Other Stroke Risk Factors  Advanced age  UDS/ETOH not performed   Obesity, Body mass index is 30.91 kg/m.   Other Active Problems  Elevated BUN -> resolved  Mild anemia - 12.2 / 35.9  Hospital day # 3  BIBY,SHARON  [facility] See Amion for Pager information 02/19/2017 11:30 AM  I have personally examined this patient, reviewed notes, independently viewed imaging studies, participated in medical decision making and plan of care.ROS completed by me personally and pertinent positives fully documented  I have made any additions or clarifications directly to the above note. Agree with note above. I had a long discussion with the patient's wife regarding his left carotid occlusion high-grade right carotid stenosis and significant stroke risk and plan for elective right carotid revascularization with carotid angioplasty and stenting and answered questions. Recommend aspirin and Plavix for now and transferred to inpatient rehabilitation and towards the end of rehabilitation stay return for elective right carotid angioplasty stenting by Dr. Corliss Skains. Family is in agreement with plan. I discussed the case with Dr. Corliss Skains who agrees. Greater than 50% time during this 25 minute visit was spent on counseling and coordination of care about his carotid occlusion,, carotid stenosis, discussion about carotid revascularization and answering questions  Delia Heady, MD Medical  Director [facility] Pager: [phone] 02/19/2017 3:29 PM  To contact Stroke Continuity provider, please refer to [email or web address]. After hours, contact General Neurology

## 2017-02-19 NOTE — Consult Note (Addendum)
Physical Medicine and Rehabilitation Consult  Reason for Consult: Right sided weakness, right facial droop and difficulty speaking Referring Physician: Dr. Roda Shutters   HPI: Erik [name] is a 61 y.o. male with history of BPH, OA s/p B-TKR , HTN (wife/patient deny) who was admitted on 02/16/17 after being found by wife lying on the ground with inability to speak. History taken from chart review, patient, and wife. CTA head/neck revealed hyperdense L-MCA sign likely indicating acute thrombus. He received tPA  and he underwent cerebral angiogram with L-MCA thrombectomy, dense calcified plaque L-ICA precluded stent placement and noted to have 73% stenosis R-ICA due to advanced atherosclerotic changes. MRI/MRA brain reviewed, showing multiple areas of infarct. Per report, multiple areas of acute or early ischemia within left supratentorial hemisphere--largest affecting left basal and temporal lobe with cytotoxic edema, petechial hemorrhage in left posterior temporal and peripheral left basal ganglia. Carotid dopplers with right severe mixed plaque origin and proximal ICA/ECA 80-99%, plan for elective surgery post-rehab.  2D echo with EF 55-60% with no wall abnormality and grade 2 diastolic dysfunction.   Patient with resultant facial weakness with mild dysarthria, mild dysphagia, aphasia, delayed processing with slow responses, mild left gaze preference and right sided weakness affecting mobility and ability to carry out ADL tasks. CIR recommended for follow up therpay.    Review of Systems  Constitutional: Negative for chills, fever and malaise/fatigue.  HENT: Negative for hearing loss and tinnitus.   Eyes: Negative for blurred vision and double vision.  Respiratory: Negative for cough and shortness of breath.   Cardiovascular: Negative for chest pain and palpitations.  Gastrointestinal: Negative for constipation, heartburn and nausea.  Genitourinary: Negative for dysuria, frequency and urgency.      Nocturia-"took medication to slow bladder"  Musculoskeletal: Negative for back pain, myalgias and neck pain.  Neurological: Positive for sensory change, speech change and focal weakness. Negative for dizziness, weakness and headaches.  Psychiatric/Behavioral: The patient is not nervous/anxious and does not have insomnia.   All other systems reviewed and are negative.     Past Medical History:  Diagnosis Date  . Anxiety   . BPH (benign prostatic hyperplasia)   . ED (erectile dysfunction)   . H/O calcium pyrophosphate deposition disease (CPPD)   . HLD (hyperlipidemia)   . Hypogonadism in male   . Lower urinary tract symptoms (LUTS)   . Nocturia   . Testosterone overdose   . Vitamin D deficiency     Past Surgical History:  Procedure Laterality Date  . BACK SURGERY    . IR ANGIO INTRA EXTRACRAN SEL COM CAROTID INNOMINATE UNI R MOD SED  02/16/2017  . IR PERCUTANEOUS ART THROMBECTOMY/INFUSION INTRACRANIAL INC DIAG ANGIO  02/16/2017  . IR PTA NON CORO-LOWER EXTREM  02/16/2017  . IR US GUIDE VASC ACCESS LEFT  02/16/2017  . IR US GUIDE VASC ACCESS RIGHT  02/16/2017  . KNEE ARTHROSCOPY Bilateral   . ROTATOR CUFF REPAIR Right     Family History  Problem Relation Age of Onset  . Diabetes Mother   . Hypertension Mother   . Dementia Mother   . Diabetes Brother       Social History:  Married. Works for [employer]. He  reports that he has never smoked. He does not have any smokeless tobacco history on file. He reports that he drinks alcohol--one mixed drink at nights and beer occasionally.  His drug history is not on file.     Allergies  Allergen Reactions  .  Celecoxib Nausea And Vomiting  . Shellfish-Derived Products Hives    Medications Prior to Admission  Medication Sig Dispense Refill  . amLODipine (NORVASC) 10 MG tablet Take 10 mg by mouth daily.  11  . CRESTOR 40 MG tablet TAKE 1 TABLET (40 MG TOTAL) BY MOUTH NIGHTLY.  3  . FLUoxetine (PROZAC) 20 MG capsule Take 20 mg by mouth  daily.  3  . hydrochlorothiazide (HYDRODIURIL) 25 MG tablet TAKE 1 TABLET (25 MG TOTAL) BY MOUTH ONCE DAILY.  3  . losartan (COZAAR) 25 MG tablet TAKE 1 TABLET (25 MG TOTAL) BY MOUTH ONCE DAILY.  3  . Multiple Vitamin (MULTIVITAMIN) tablet Take 1 tablet by mouth daily.    Marland Kitchen omeprazole (PRILOSEC) 40 MG capsule TAKE 1 CAPSULE (40 MG TOTAL) BY MOUTH ONCE DAILY.  3  . phentermine (ADIPEX-P) 37.5 MG tablet Take 37.5 mg by mouth every morning.  2  . tadalafil (CIALIS) 5 MG tablet Take 5 mg by mouth daily as needed for erectile dysfunction.    . tamsulosin (FLOMAX) 0.4 MG CAPS capsule Take 0.4 mg by mouth daily.  3    Home: Home Living Family/patient expects to be discharged to:: Inpatient rehab Living Arrangements: Spouse/significant other, Children, Other relatives Available Help at Discharge: Family, Available PRN/intermittently Type of Home: House Home Access: Stairs to enter [employer] of Steps: 6 Entrance Stairs-Rails: Left, Right Home Layout: Two level, Bed/bath upstairs, Full bath on main level Alternate Level Stairs-Number of Steps: flight Alternate Level Stairs-Rails: Right, Can reach both, Left Bathroom Shower/Tub: Walk-in shower, Other (comment), Door Bathroom Toilet: Handicapped height Home Equipment: None  Functional History: Prior Function Level of Independence: Independent Comments: drives, works full time in a [employer] Status:  Mobility: Bed Mobility Overal bed mobility: Needs Assistance Bed Mobility: Supine to Sit Supine to sit: HOB elevated, Min guard General bed mobility comments: moves quickly Transfers Overall transfer level: Needs assistance Equipment used: 2 person hand held assist Transfers: Sit to/from Stand, Stand Pivot Transfers Sit to Stand: Min assist Stand pivot transfers: Min assist General transfer comment: Mod assist to support trunk and stabilize his right leg in standing.  Hyperextended right knee and foot sliding out from  under him in standing.  Knee and foot had to be blocked to prevent LOB.  Pt was able to take 1-2 small steps and use his left arm to reach for the far side of the recliner chair on his left side to get up.  He used hip extension to bring right leg around.  Ambulation/Gait General Gait Details: will need two people to be safe at attempts with gait, but I think he will be ready on his next session.     ADL: ADL Overall ADL's : Needs assistance/impaired Eating/Feeding: Supervision/ safety, Set up, Sitting Grooming: Minimal assistance, Sitting Upper Body Bathing: Minimal assistance, Sitting Lower Body Bathing: Moderate assistance (min A sit<>stand) Upper Body Dressing : Moderate assistance, Sitting Lower Body Dressing: Moderate assistance (min A sit<>stand) Toilet Transfer: Minimal assistance, Stand-pivot Toileting- Clothing Manipulation and Hygiene: Maximal assistance (min A sit<>stand)  Cognition: Cognition Overall Cognitive Status: Impaired/Different from baseline Orientation Level: Oriented to person, Oriented to place, Oriented to situation Cognition Arousal/Alertness: Awake/alert Behavior During Therapy: WFL for tasks assessed/performed Overall Cognitive Status: Impaired/Different from baseline Area of Impairment: Problem solving Following Commands: Follows one step commands with increased time Problem Solving: Slow processing General Comments: Pt is slow to answer questions and at time innacurate with his answers.   Blood pressure Marland Kitchen)  148/69, pulse 63, temperature 98.4 F (36.9 C), temperature source Oral, resp. rate 18, height  (1.778 m), weight 97.7 kg (215 lb 6.4 oz), SpO2 95 %. Physical Exam  Nursing note and vitals reviewed. Constitutional: He is oriented to person, place, and time. He appears well-developed and well-nourished.  HENT:  Head: Normocephalic and atraumatic.  Mouth/Throat: Oropharynx is clear and moist.  Eyes: Conjunctivae and EOM are normal. Pupils  are equal, round, and reactive to light.  Neck: Normal range of motion. Neck supple.  Cardiovascular: Normal rate and regular rhythm.   Respiratory: Effort normal and breath sounds normal. No respiratory distress. He has no wheezes.  GI: Soft. Bowel sounds are normal. He exhibits no distension. There is no tenderness.  Musculoskeletal: He exhibits no edema or tenderness.  Neurological: He is alert and oriented to person, place, and time.  Facial weakness with dysarthria.  Able to follow simple one and two step commands.  Motor: RUE/RLE: 5/5 LUE: 4-/5 proximal to distal LLE: 4/5 proximal to distal Sensation intact to light touch DTRs symmetric  Skin: Skin is warm and dry.  Psychiatric: He has a normal mood and affect. His behavior is normal. Thought content normal.    Results for orders placed or performed during the hospital encounter of 02/16/17 (from the past 24 hour(s))  Glucose, capillary     Status: Abnormal   Collection Time: 02/18/17 11:42 AM  Result Value Ref Range   Glucose-Capillary 159 (H) 65 - 99 mg/dL   Comment 1 Notify RN    Comment 2 Document in Chart   Glucose, capillary     Status: Abnormal   Collection Time: 02/18/17  4:52 PM  Result Value Ref Range   Glucose-Capillary 114 (H) 65 - 99 mg/dL   Comment 1 Notify RN    Comment 2 Document in Chart   Glucose, capillary     Status: Abnormal   Collection Time: 02/18/17  9:18 PM  Result Value Ref Range   Glucose-Capillary 122 (H) 65 - 99 mg/dL   Comment 1 Notify RN    Comment 2 Document in Chart   Phosphorus     Status: None   Collection Time: 02/19/17  4:28 AM  Result Value Ref Range   Phosphorus 3.4 2.5 - 4.6 mg/dL  Magnesium     Status: None   Collection Time: 02/19/17  4:28 AM  Result Value Ref Range   Magnesium 2.1 1.7 - 2.4 mg/dL  Basic metabolic panel     Status: Abnormal   Collection Time: 02/19/17  4:28 AM  Result Value Ref Range   Sodium 139 135 - 145 mmol/L   Potassium 3.9 3.5 - 5.1 mmol/L    Chloride 102 101 - 111 mmol/L   CO2 26 22 - 32 mmol/L   Glucose, Bld 112 (H) 65 - 99 mg/dL   BUN 15 6 - 20 mg/dL   Creatinine, Ser 1.61 0.61 - 1.24 mg/dL   Calcium 9.2 8.9 - 09.6 mg/dL   GFR calc non Af Amer >60 >60 mL/min   GFR calc Af Amer >60 >60 mL/min   Anion gap 11 5 - 15  CBC     Status: Abnormal   Collection Time: 02/19/17  4:28 AM  Result Value Ref Range   WBC 7.0 4.0 - 10.5 K/uL   RBC 3.99 (L) 4.22 - 5.81 MIL/uL   Hemoglobin 12.2 (L) 13.0 - 17.0 g/dL   HCT 04.5 (L) 40.9 - 81.1 %   MCV 90.0 78.0 -  100.0 fL   MCH 30.6 26.0 - 34.0 pg   MCHC 34.0 30.0 - 36.0 g/dL   RDW 16.1 09.6 - 04.5 %   Platelets 151 150 - 400 K/uL  Glucose, capillary     Status: Abnormal   Collection Time: 02/19/17  6:08 AM  Result Value Ref Range   Glucose-Capillary 120 (H) 65 - 99 mg/dL   Comment 1 Notify RN    Comment 2 Document in Chart    No results found.  Assessment/Plan: Diagnosis: Left MCA CVA Labs and images independently reviewed.  Records reviewed and summated above. Stroke: Continue secondary stroke prophylaxis and Risk Factor Modification listed below:   Antiplatelet therapy:   Blood Pressure Management:  Continue current medication with prn's with permisive HTN per primary team Statin Agent:   Diabetes management:   Left sided hemiparesis: fit for orthosis to prevent contractures (resting hand splint for day, wrist cock up splint at night, PRAFO, etc) Motor recovery: Fluoxetine  1. Does the need for close, 24 hr/day medical supervision in concert with the patient's rehab needs make it unreasonable for this patient to be served in a less intensive setting? Yes 2. Co-Morbidities requiring supervision/potential complications: BPH (cont meds), OA s/p B-TKR (Biofeedback training with therapies to help reduce reliance on opiate pain medications, monitor pain control during therapies, and sedation at rest and titrate to maximum efficacy to ensure participation and gains in therapies), HTN  (wife/patient deny), right severe mixed plaque (CEA post-rehab), dysphagia (compensatory strategies), ABLA (transfuse if necessary to ensure appropriate perfusion for increased activity tolerance), nocturia (consider meds), newly diagnosed DM (Monitor in accordance with exercise and adjust meds as necessary) 3. Due to bladder management, safety, skin/wound care, disease management and patient education, does the patient require 24 hr/day rehab nursing? Yes 4. Does the patient require coordinated care of a physician, rehab nurse, PT (1-2 hrs/day, 5 days/week), OT (1-2 hrs/day, 5 days/week) and SLP (1-2 hrs/day, 5 days/week) to address physical and functional deficits in the context of the above medical diagnosis(es)? Yes Addressing deficits in the following areas: balance, endurance, locomotion, strength, transferring, bowel/bladder control, bathing, dressing, toileting, speech and psychosocial support 5. Can the patient actively participate in an intensive therapy program of at least 3 hrs of therapy per day at least 5 days per week? Yes 6. The potential for patient to make measurable gains while on inpatient rehab is excellent 7. Anticipated functional outcomes upon discharge from inpatient rehab are supervision  with PT, supervision with OT, modified independent with SLP. 8. Estimated rehab length of stay to reach the above functional goals is: 12-16 days. 9. Does the patient have adequate social supports and living environment to accommodate these discharge functional goals? Yes 10. Anticipated D/C setting: Home 11. Anticipated post D/C treatments: HH therapy and Home excercise program 12. Overall Rehab/Functional Prognosis: good  RECOMMENDATIONS: This patient's condition is appropriate for continued rehabilitative care in the following setting: CIR Patient has agreed to participate in recommended program. Yes Note that insurance prior authorization may be required for reimbursement for  recommended care.  Comment: Rehab Admissions Coordinator to follow up.  Maryla Morrow, MD, [address] [place] 02/19/2017

## 2017-02-19 NOTE — Anesthesia Postprocedure Evaluation (Signed)
Anesthesia Post Note  Patient: Erik [name]  Procedure(s) Performed: Procedure(s) (LRB): RADIOLOGY WITH ANESTHESIA (N/A)  Patient location during evaluation: SICU Anesthesia Type: General Level of consciousness: sedated Pain management: pain level controlled Vital Signs Assessment: post-procedure vital signs reviewed and stable Respiratory status: patient remains intubated per anesthesia plan Cardiovascular status: stable Anesthetic complications: no       Last Vitals:  Vitals:   02/19/17 0442 02/19/17 0900  BP: 140/74 (!) 148/69  Pulse: 64 63  Resp: 18 18  Temp: 37.1 C 36.9 C    Last Pain:  Vitals:   02/19/17 1001  TempSrc:   PainSc: 0-No pain                 Albirta Rhinehart S

## 2017-02-19 NOTE — Progress Notes (Signed)
Nutrition Follow-up  DOCUMENTATION CODES:   Obesity unspecified  INTERVENTION:  Monitor PO intake for adequacy   NUTRITION DIAGNOSIS:   Inadequate oral intake related to inability to eat as evidenced by NPO status. Resolved; diet advanced on 4/15  GOAL:   Patient will meet greater than or equal to 90% of their needs  progressing  MONITOR:   Vent status, Labs, Weight trends, I & O's  REASON FOR ASSESSMENT:   Ventilator    ASSESSMENT:   61 yo Male with PMH of HTN and HLD who presented to [facility] on 4/12 with dysarthria and weakness Code stroke was called; patient received TPA at 11:15pm and CTA showed a left M1 occlusion. Per neuro notes, it is a possible embolus from left carotid stenosis.  MD meeting with pt at time of visit. Per nursing notes, pt ate 75% of breakfast this morning. Pt improving per MD note. Pt evaluated for CIR.   Labs reviewed. Stable.  Diet Order:  Diet Carb Modified Fluid consistency: Thin; Room service appropriate? Yes  Skin:  Reviewed, no issues  Last BM:  unknown  Height:   Ht Readings from Last 1 Encounters:  02/16/17  (1.778 m)    Weight:   Wt Readings from Last 1 Encounters:  02/19/17 215 lb 6.4 oz (97.7 kg)    Ideal Body Weight:  75.4 kg  BMI:  Body mass index is 30.91 kg/m.  Estimated Nutritional Needs:   Kcal:  2000-2200  Protein:  105-115 grams  Fluid:  2.2-2.4 L/day  EDUCATION NEEDS:   No education needs identified at this time  Dorothea Ogle RD, LDN, CSP Inpatient Clinical Dietitian Pager: [phone] After Hours Pager: [phone]

## 2017-02-19 NOTE — Clinical Social Work Note (Signed)
Clinical Social Work Assessment  Patient Details  Name: Erik [name] MRN: [id number] Date of Birth: 07/16/1956  Date of referral:  02/19/17               Reason for consult:  Facility Placement                Permission sought to share information with:  Facility Medical sales representative, Family Supports Permission granted to share information::  Yes, Verbal Permission Granted  Name::     Education administrator::  SNF[facility]  Relationship::  Spouse  Contact Information:     Housing/Transportation Living arrangements for the past 2 months:  Single Family Home Source of Information:  Patient, Spouse Patient Interpreter Needed:  None Criminal Activity/Legal Involvement Pertinent to Current Situation/Hospitalization:  No - Comment as needed Significant Relationships:  Spouse Lives with:  Spouse Do you feel safe going back to the place where you live?  No Need for family participation in patient care:  Yes (Comment)  Care giving concerns:  CSW received consult for possible SNF placement at time of discharge if [facility] is unable to accept. CSW spoke with patient and Erik [name] regarding PT recommendation of SNF placement at time of discharge. Patient reported that patient's spouse is currently unable to care for patient at their home given patient's current physical needs and fall risk. Patient expressed understanding of PT recommendation and is agreeable to SNF placement at time of discharge. CSW to continue to follow and assist with discharge planning needs.   Social Worker assessment / plan:  CSW spoke with patient and Erik [name] concerning possibility of rehab at [facility] before returning home if [facility] is unable to admit.  Employment status:  Technical brewer:  Managed Care PT Recommendations:  Inpatient Rehab Consult, Skilled Nursing Facility Information / Referral to community resources:  Skilled Nursing Facility  Patient/Family's Response to care:  Patient recognizes need for rehab before returning  home and is agreeable to a SNF in [place]. Erik [name] reported being upset because she was told by the MD that patient could go to [facility]. CSW explained barriers of bed availability and authorization.   Patient/Family's Understanding of and Emotional Response to Diagnosis, Current Treatment, and Prognosis:  Patient/family is realistic regarding therapy needs and expressed being hopeful for SNF placement. Patient expressed understanding of CSW role and discharge process. No questions/concerns about plan or treatment, but Erik [name] is very concerned that something will happen to patient if he goes to SNF before he has the stent placed.  Emotional Assessment Appearance:  Appears stated age Attitude/Demeanor/Rapport:  Other (Appropriate) Affect (typically observed):  Accepting, Appropriate Orientation:  Oriented to Self, Oriented to Place, Oriented to Situation, Oriented to  Time Alcohol / Substance use:  Not Applicable Psych involvement (Current and /or in the community):  No (Comment)  Discharge Needs  Concerns to be addressed:  Care Coordination Readmission within the last 30 days:  No Current discharge risk:  None Barriers to Discharge:  Continued Medical Work up   [employer], LCSWA 02/19/2017, 4:25 PM

## 2017-02-19 NOTE — Care Management Note (Signed)
Case Management Note  Patient Details  Name: Erik [name] MRN: [id number] Date of Birth: 18-Aug-1956  Subjective/Objective:   Pt admitted with CVA.  Pt is from home with his wife.                 Action/Plan: PT/OT recommending [facility]. CM following for d/c disposition.   Expected Discharge Date:                  Expected Discharge Plan:  IP Rehab Facility  In-House Referral:     Discharge planning Services     Post Acute Care Choice:    Choice offered to:     DME Arranged:    DME Agency:     HH Arranged:    HH Agency:     Status of Service:  In process, will continue to follow  If discussed at Long Length of Stay Meetings, dates discussed:    Additional Comments:  [name], RN 02/19/2017, 3:55 PM

## 2017-02-19 NOTE — Progress Notes (Signed)
Patient's pasrr is under review. Patient is not able to discharge to SNF without a pasrr.  Osborne Casco Cyrena Kuchenbecker LCSWA [phone]

## 2017-02-19 NOTE — Evaluation (Signed)
SLP Cancellation Note  Patient Details Name: Erik [name] MRN: [id number] DOB: [birth year]   Cancelled treatment:       Reason Eval/Treat Not Completed:  (pt with stroke MD/team at this time, will continue efforts)  Donavan Burnet, MS [facility] SLP [phone]

## 2017-02-19 NOTE — Progress Notes (Signed)
Referring Physician(s): Dr. Ritta Slot  Supervising Physician: Gilmer Mor  Patient Status:  [facility] - In-pt  Chief Complaint:  Left ICA CVA  Subjective:  Patient states he is doing better today. He feels his right had strength has already improved.  He still has some aphasia.  His wife is at the bedside and feels he has improved since yesterday.  Allergies: Celecoxib and Shellfish-derived products  Medications: Prior to Admission medications   Medication Sig Start Date End Date Taking? Authorizing Provider  amLODipine (NORVASC) 10 MG tablet Take 10 mg by mouth daily. 03/24/15  Yes Historical Provider, MD  CRESTOR 40 MG tablet TAKE 1 TABLET (40 MG TOTAL) BY MOUTH NIGHTLY. 03/24/15  Yes Historical Provider, MD  FLUoxetine (PROZAC) 20 MG capsule Take 20 mg by mouth daily. 01/25/17  Yes Historical Provider, MD  hydrochlorothiazide (HYDRODIURIL) 25 MG tablet TAKE 1 TABLET (25 MG TOTAL) BY MOUTH ONCE DAILY. 03/24/15  Yes Historical Provider, MD  losartan (COZAAR) 25 MG tablet TAKE 1 TABLET (25 MG TOTAL) BY MOUTH ONCE DAILY. 03/27/15  Yes Historical Provider, MD  Multiple Vitamin (MULTIVITAMIN) tablet Take 1 tablet by mouth daily.   Yes Historical Provider, MD  omeprazole (PRILOSEC) 40 MG capsule TAKE 1 CAPSULE (40 MG TOTAL) BY MOUTH ONCE DAILY. 03/24/15  Yes Historical Provider, MD  phentermine (ADIPEX-P) 37.5 MG tablet Take 37.5 mg by mouth every morning. 01/20/17  Yes Historical Provider, MD  tadalafil (CIALIS) 5 MG tablet Take 5 mg by mouth daily as needed for erectile dysfunction.   Yes Historical Provider, MD  tamsulosin (FLOMAX) 0.4 MG CAPS capsule Take 0.4 mg by mouth daily. 02/12/17  Yes Historical Provider, MD     Vital Signs: BP (!) 148/69 (BP Location: Left Arm)   Pulse 63   Temp 98.4 F (36.9 C) (Oral)   Resp 18   Ht 5\' 10"  (1.778 m)   Wt 215 lb 6.4 oz (97.7 kg)   SpO2 95%   BMI 30.91 kg/m   Physical Exam Awake and alert NAD + Aphasia and facial  droop 4/5 strength right hand, but right foot still 3/5.  Imaging: Ct Angio Head W Or Wo Contrast  Result Date: 02/16/2017 CLINICAL DATA:  Right-sided weakness and aphasia EXAM: CT ANGIOGRAPHY HEAD AND NECK TECHNIQUE: Multidetector CT imaging of the head and neck was performed using the standard protocol during bolus administration of intravenous contrast. Multiplanar CT image reconstructions and MIPs were obtained to evaluate the vascular anatomy. Carotid stenosis measurements (when applicable) are obtained utilizing NASCET criteria, using the distal internal carotid diameter as the denominator. CONTRAST:  50 mL Isovue 370 IV COMPARISON:  Head CT 02/16/2017 FINDINGS: CTA NECK FINDINGS Aortic arch: There is no aneurysm or dissection of the visualized ascending aorta or aortic arch. There is a normal 3 vessel branching pattern. The visualized proximal subclavian arteries are widely patent. There is aortic atherosclerotic calcification. Right carotid system: There is multifocal atherosclerotic calcification within the right common carotid artery. There is mixed calcified and noncalcified plaque within the proximal right internal carotid artery with at least 90% stenosis. The distal right ICA is normal. Left carotid system: There is calcification at the origin of the left common carotid artery. The left internal carotid artery is occluded at its origin and along the remainder of its cervical course to the skullbase. There is return of contrast enhancement at the distal petrous segment. Vertebral arteries: The vertebral system is codominant. Both vertebral artery origins are patent. There is multifocal irregular  narrowing of the left vertebral artery without hemodynamically significant stenosis. Both V3 and V4 segments are normal. The vertebrobasilar confluence is normal. Skeleton: There is no bony spinal canal stenosis. No lytic or blastic lesions. Other neck: The nasopharynx is clear. The oropharynx and  hypopharynx are normal. The epiglottis is normal. The supraglottic larynx, glottis and subglottic larynx are normal. No retropharyngeal collection. The parapharyngeal spaces are preserved. The parotid and submandibular glands are normal. No sialolithiasis or salivary ductal dilatation. The thyroid gland is normal. There is no cervical lymphadenopathy. Upper chest: No pneumothorax or pleural effusion. No nodules or masses. Review of the MIP images confirms the above findings CTA HEAD FINDINGS Anterior circulation: --Intracranial internal carotid arteries: The left ICA is occluded without opacification of the proximal petrous segment. The distal petrous segment and the remainder of the intracranial left ICA are opacified. There is mild atherosclerotic calcification of the cavernous and clinoid segments. The right side is normal. --Anterior cerebral arteries: Normal. --Middle cerebral arteries: There is complete occlusion of the left middle cerebral artery at its origin. No enhancement of the M1 or and 2 segments is seen. The distal small vessels in the left MCA distribution are relatively symmetric compared to the right. The right MCA is normal. --Posterior communicating arteries: Present bilaterally. Posterior circulation: --Posterior cerebral arteries: Normal. --Superior cerebellar arteries: Normal. --Basilar artery: Normal. --Anterior inferior cerebellar arteries: Not clearly visualized. --Posterior inferior cerebellar arteries: Normal. Venous sinuses: As permitted by contrast timing, patent. Anatomic variants: None Delayed phase: Not performed. Review of the MIP images confirms the above findings IMPRESSION: 1. 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 Distal collateral flow within the left MCA distribution persists and is relatively symmetric to the right side. 2. 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 3. At least 90% stenosis at the origin of the right internal carotid artery secondary to mixed calcified and noncalcified plaque. Critical Value/emergent results were called by telephone at the time of interpretation on 02/16/2017 at 1:25 am to Dr. Ritta Slot , who verbally acknowledged these results. Electronically Signed   By: Deatra Robinson M.D.   On: 02/16/2017 01:47   Ct Head Wo Contrast  Result Date: 02/16/2017 CLINICAL DATA:  Left MCA stroke, status post intervention. EXAM: CT HEAD WITHOUT CONTRAST TECHNIQUE: Contiguous axial images were obtained from the base of the skull through the vertex without intravenous contrast. COMPARISON:  CTA head neck 02/16/2017 FINDINGS: Brain: Gray-white differentiation in the left MCA territory is maintained. The basal ganglia and insular ribbons are preserved. There is no evidence of acute hemorrhage. There is residual contrast material opacifying the venous sinuses. Vascular: The previously seen hyperdense thrombus within the left middle cerebral artery is no longer present. Skull: Normal Sinuses/Orbits: Moderate bilateral maxillary mucosal thickening. Normal orbits. Mastoids are clear. Other: None. IMPRESSION: 1. Status post stroke intervention with resolution of previously seen hyperdense thrombus in the left middle cerebral artery. 2. No acute hemorrhage or cytotoxic edema. Electronically Signed   By: Deatra Robinson M.D.   On: 02/16/2017 06:43   Ct Angio Neck W Or Wo Contrast  Result Date: 02/16/2017 CLINICAL DATA:  Right-sided weakness and aphasia EXAM: CT ANGIOGRAPHY HEAD AND NECK TECHNIQUE: Multidetector CT imaging of the head and neck was performed using the standard protocol during bolus administration of intravenous contrast. Multiplanar CT image reconstructions and MIPs were  obtained to evaluate the vascular anatomy. Carotid stenosis measurements (when applicable) are obtained utilizing  NASCET criteria, using the distal internal carotid diameter as the denominator. CONTRAST:  50 mL Isovue 370 IV COMPARISON:  Head CT 02/16/2017 FINDINGS: CTA NECK FINDINGS Aortic arch: There is no aneurysm or dissection of the visualized ascending aorta or aortic arch. There is a normal 3 vessel branching pattern. The visualized proximal subclavian arteries are widely patent. There is aortic atherosclerotic calcification. Right carotid system: There is multifocal atherosclerotic calcification within the right common carotid artery. There is mixed calcified and noncalcified plaque within the proximal right internal carotid artery with at least 90% stenosis. The distal right ICA is normal. Left carotid system: There is calcification at the origin of the left common carotid artery. The left internal carotid artery is occluded at its origin and along the remainder of its cervical course to the skullbase. There is return of contrast enhancement at the distal petrous segment. Vertebral arteries: The vertebral system is codominant. Both vertebral artery origins are patent. There is multifocal irregular narrowing of the left vertebral artery without hemodynamically significant stenosis. Both V3 and V4 segments are normal. The vertebrobasilar confluence is normal. Skeleton: There is no bony spinal canal stenosis. No lytic or blastic lesions. Other neck: The nasopharynx is clear. The oropharynx and hypopharynx are normal. The epiglottis is normal. The supraglottic larynx, glottis and subglottic larynx are normal. No retropharyngeal collection. The parapharyngeal spaces are preserved. The parotid and submandibular glands are normal. No sialolithiasis or salivary ductal dilatation. The thyroid gland is normal. There is no cervical lymphadenopathy. Upper chest: No pneumothorax or pleural effusion. No nodules or masses. Review of the MIP images confirms the above findings CTA HEAD FINDINGS Anterior circulation: --Intracranial  internal carotid arteries: The left ICA is occluded without opacification of the proximal petrous segment. The distal petrous segment and the remainder of the intracranial left ICA are opacified. There is mild atherosclerotic calcification of the cavernous and clinoid segments. The right side is normal. --Anterior cerebral arteries: Normal. --Middle cerebral arteries: There is complete occlusion of the left middle cerebral artery at its origin. No enhancement of the M1 or and 2 segments is seen. The distal small vessels in the left MCA distribution are relatively symmetric compared to the right. The right MCA is normal. --Posterior communicating arteries: Present bilaterally. Posterior circulation: --Posterior cerebral arteries: Normal. --Superior cerebellar arteries: Normal. --Basilar artery: Normal. --Anterior inferior cerebellar arteries: Not clearly visualized. --Posterior inferior cerebellar arteries: Normal. Venous sinuses: As permitted by contrast timing, patent. Anatomic variants: None Delayed phase: Not performed. Review of the MIP images confirms the above findings IMPRESSION: 1. 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 Distal collateral flow within the left MCA distribution persists and is relatively symmetric to the right side. 2. 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 3. At least 90% stenosis at the origin of the right internal carotid artery secondary to mixed calcified and noncalcified plaque. Critical Value/emergent results were called by telephone at the time of interpretation on 02/16/2017 at 1:25 am to Dr. Ritta Slot , who verbally acknowledged these results. Electronically Signed   By: Deatra Robinson M.D.   On: 02/16/2017 01:47   Mr Maxine Glenn Head Wo Contrast  Result Date: 02/17/2017 CLINICAL DATA:  Stroke, status  post tPA and percutaneous intervention. EXAM: MRI HEAD WITHOUT CONTRAST  MRA HEAD WITHOUT CONTRAST TECHNIQUE: Multiplanar, multiecho pulse sequences of the brain and surrounding structures were obtained without intravenous contrast. Angiographic images of the head were obtained using MRA technique without contrast. COMPARISON:  CTA head and neck 02/16/2017 FINDINGS: MRI HEAD FINDINGS Brain: There is multifocal diffusion restriction. The largest areas are within the left caudate and retained in, the anterior left temporal lobe and the posterior left temporal lobe. Other small areas are seen along multiple gyri in the left parietal lobe. All of the areas of diffusion restriction are supratentorial and within the left hemisphere. There is associated hyperintense T2 weighted signal at these locations. There is mild multifocal hyperintense T2-weighted signal within the periventricular white matter, most often seen in the setting of chronic microvascular ischemia. Susceptibility weighted imaging shows petechial hemorrhage in the peripheral left basal ganglia and posterior left temporal lobe. No large intraparenchymal hematoma. No mass lesion or midline shift. No hydrocephalus or extra-axial fluid collection. The midline structures are normal. No age advanced or lobar predominant atrophy. No evidence of chronic microhemorrhage or amyloid angiopathy. Skull and upper cervical spine: The visualized skull base, calvarium, upper cervical spine and extracranial soft tissues are normal. Sinuses/Orbits: There is diffuse paranasal sinus mucosal thickening. The mastoids are clear. Normal orbits. MRA HEAD FINDINGS Intracranial internal carotid arteries: There is attenuated flow related enhancement within the right internal carotid artery, likely indicating slow flow. No flow related enhancement is seen in the left internal carotid artery. Anterior cerebral arteries: Normal. Middle cerebral arteries: Normal. Posterior communicating  arteries: Present bilaterally. Posterior cerebral arteries: Normal. Basilar artery: Normal. Vertebral arteries: Left dominant. Normal. Superior cerebellar arteries: Normal. Anterior inferior cerebellar arteries: Normal. Posterior inferior cerebellar arteries: Normal. IMPRESSION: 1. Multiple areas of acute or early subacute ischemia within the supratentorial left hemisphere, with the largest areas located in the left basal ganglia and left temporal lobe. There is associated cytotoxic edema without midline shift herniation or other significant mass effect. 2. Petechial hemorrhage in the posterior left temporal lobe and peripheral left basal ganglia without space-occupying hematoma. Short interval follow-up head CT is recommended. 3. No flow related enhancement within the visualized left internal carotid artery and diminished enhancement in the right internal carotid artery. This may be due to slow flow, though recurrent occlusion of the treated left internal carotid artery would be difficult to exclude. CTA of the neck may be helpful to assess possible residual or recurrent occlusion/stenosis. 4. Normal flow related enhancement of the left middle cerebral artery. Electronically Signed   By: Deatra Robinson M.D.   On: 02/17/2017 03:35   Mr Brain Wo Contrast  Result Date: 02/17/2017 CLINICAL DATA:  Stroke, status post tPA and percutaneous intervention. EXAM: MRI HEAD WITHOUT CONTRAST MRA HEAD WITHOUT CONTRAST TECHNIQUE: Multiplanar, multiecho pulse sequences of the brain and surrounding structures were obtained without intravenous contrast. Angiographic images of the head were obtained using MRA technique without contrast. COMPARISON:  CTA head and neck 02/16/2017 FINDINGS: MRI HEAD FINDINGS Brain: There is multifocal diffusion restriction. The largest areas are within the left caudate and retained in, the anterior left temporal lobe and the posterior left temporal lobe. Other small areas are seen along multiple gyri  in the left parietal lobe. All of the areas of diffusion restriction are supratentorial and within the left hemisphere. There is associated hyperintense T2 weighted signal at these locations. There is mild multifocal hyperintense T2-weighted signal within the periventricular white matter, most often seen in the setting of chronic microvascular ischemia. Susceptibility weighted imaging shows  petechial hemorrhage in the peripheral left basal ganglia and posterior left temporal lobe. No large intraparenchymal hematoma. No mass lesion or midline shift. No hydrocephalus or extra-axial fluid collection. The midline structures are normal. No age advanced or lobar predominant atrophy. No evidence of chronic microhemorrhage or amyloid angiopathy. Skull and upper cervical spine: The visualized skull base, calvarium, upper cervical spine and extracranial soft tissues are normal. Sinuses/Orbits: There is diffuse paranasal sinus mucosal thickening. The mastoids are clear. Normal orbits. MRA HEAD FINDINGS Intracranial internal carotid arteries: There is attenuated flow related enhancement within the right internal carotid artery, likely indicating slow flow. No flow related enhancement is seen in the left internal carotid artery. Anterior cerebral arteries: Normal. Middle cerebral arteries: Normal. Posterior communicating arteries: Present bilaterally. Posterior cerebral arteries: Normal. Basilar artery: Normal. Vertebral arteries: Left dominant. Normal. Superior cerebellar arteries: Normal. Anterior inferior cerebellar arteries: Normal. Posterior inferior cerebellar arteries: Normal. IMPRESSION: 1. Multiple areas of acute or early subacute ischemia within the supratentorial left hemisphere, with the largest areas located in the left basal ganglia and left temporal lobe. There is associated cytotoxic edema without midline shift herniation or other significant mass effect. 2. Petechial hemorrhage in the posterior left temporal  lobe and peripheral left basal ganglia without space-occupying hematoma. Short interval follow-up head CT is recommended. 3. No flow related enhancement within the visualized left internal carotid artery and diminished enhancement in the right internal carotid artery. This may be due to slow flow, though recurrent occlusion of the treated left internal carotid artery would be difficult to exclude. CTA of the neck may be helpful to assess possible residual or recurrent occlusion/stenosis. 4. Normal flow related enhancement of the left middle cerebral artery. Electronically Signed   By: Deatra Robinson M.D.   On: 02/17/2017 03:35   Ir US Guide Vasc Access Left  Result Date: 02/16/2017 INDICATION: 61 year old male with acute right-sided weakness secondary to acute left hemisphere stroke. CT demonstrates left sided emergent large vessel occlusion of the M1 segment. Last known well 11:30 p.m., and within tPA time frame. IV tPA administered, and given his baseline function, angiogram for mechanical thrombectomy was pursued. CTA demonstrates left ICA occlusion. 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 COMPLICATIONS: None TECHNIQUE: Informed written consent was obtained from the patient's family/wife after a thorough discussion of the procedural risks, benefits and alternatives. Specific risks discussed include: Bleeding,  infection, contrast reaction, kidney injury/failure, need for further procedure/surgery, arterial injury or dissection, embolization to new territory, intracranial hemorrhage (10-15% risk), neurologic deterioration, cardiopulmonary collapse, death. All questions were addressed. Maximal Sterile Barrier Technique was utilized including during the procedure including caps, mask, sterile gowns, sterile gloves, sterile drape, hand hygiene and skin antiseptic. A timeout was performed prior to the initiation of the procedure. Ultrasound survey of the left inguinal region was performed with images stored and sent to PACs. A micropuncture needle was used access the left common femoral artery under ultrasound. With excellent arterial blood flow returned, an .018 micro wire was passed through the needle, observed to enter the abdominal  aorta under fluoroscopy. The needle was removed, and a micropuncture sheath was placed over the wire. The inner dilator and wire were removed, and an 035 Bentson wire was advanced under fluoroscopy into the abdominal aorta. The sheath was removed and a standard 4 [place] vascular sheath was placed for hemodynamic monitoring. The dilator was removed and the sheath was flushed. Ultrasound survey of the right inguinal region was performed with images stored and sent to PACs. A micropuncture needle was used access the right common femoral artery under ultrasound. With excellent arterial blood flow returned, an .018 micro wire was passed through the needle, observed to enter the abdominal aorta under fluoroscopy. The needle was removed, and a micropuncture sheath was placed over the wire. The inner dilator and wire were removed, and an 035 Bentson wire was advanced under fluoroscopy into the abdominal aorta. The sheath was removed and a standard 5 [place] vascular sheath was placed for hemodynamic monitoring. The dilator was removed and the sheath was flushed. A 17F JB-1 diagnostic catheter was advanced  over the wire to the proximal descending thoracic aorta. Wire was then removed. Double flush of the catheter was performed. Catheter was then used to select the right common carotid artery. Angiogram was performed of the right-sided cervical and cerebral vasculature. Catheter was then withdrawn into the aortic arch, and the origin of the left common carotid artery was selected. Road map angiogram was performed. Standard Glidewire was used to advanced the catheter into the left common carotid artery. Formal angiogram was then performed, confirming the left common carotid artery occlusion and left MCA occlusion. The Glidewire was then used to advance the JB 1 catheter into the external carotid artery branches. Glidewire was then exchanged for a Corporate investment banker. Exchange length Rosen wire was then passed through the diagnostic catheter to the distal common carotid artery and the diagnostic catheter was removed. The 5 French sheath was removed and exchanged for 8 French 55 centimeter BrightTip sheath. Sheath was flushed and attached to pressurized and heparinized saline bag for constant forward flow. Then an 8 [place], 85 cm Flowgate balloon tip catheter was prepared on the back table with inflation of the balloon with 50/50 concentration of dilute contrast. The balloon catheter was then advanced over the wire, positioned into the distal common carotid artery. Copious back flush was performed and the balloon catheter was attached to heparinized and pressurized saline bag for forward flow. Rapid transit microcatheter and soft tip Transcend wire were then used to probe the origin of the common carotid artery. The micro wiring catheter combination were successful with crossing the acute occlusion, with the wire catheter combination place distally into the left ICA. Wire was removed and back flow blood was confirmed to confirm luminal position. Small contrast injection through the microcatheter was performed. Exchange length  Transcend wire was then placed through the microcatheter, it and a rapid transit was removed over the wire. Balloon angioplasty was then performed at the left ICA occlusion with a 4mm x 30mm Via-Trak rapid exchange angioplasty balloon. Given the recalcitrant stenosis, a 5mm x 30mm balloon was selected with balloon angioplasty to 7 atmosphere. The balloon was then removed, and Provue18 catheter was advanced over the Transcend wire. Transcend wire was removed and a synchro soft micro wire was selected. Micro wire was then advanced into the MCA. The micro wire was then carefully advanced through the occluded segment. Microcatheter would not pass beyond the ophthalmic segment, likely secondary to the incomplete support system with the balloon  catheter below the ICA origin. The exchange length Transcend wire was then placed through the microcatheter, and the microcatheter removed. An intermediate CAT 6 was selected, placed over the micro wire into the distal common carotid artery. The Provue 18 catheter was then advanced through the intermediate catheter while observing the distal aspect of the wire. The combination of the microcatheter and the intermediate catheter were successful in navigating to the skullbase through the persisting stenosis at the right ICA origin. Single attempt a passing the balloon catheter into the ICA was unsuccessful. Tip of the balloon catheter remained in the common carotid artery. The extra support from the intermediate catheter was useful in navigating the micro wire and microcatheter through the occlusion of the MCA into parietal branch. Micro wire was then removed. Blood was then aspirated through the hub of the microcatheter, and a gentle contrast injection was performed confirming intraluminal position. A rotating hemostatic valve was then attached to the back end of the microcatheter, and a pressurized and heparinized saline bag was attached to the catheter. 4 x 40 solitaire device was  then selected. Back flush was achieved at the rotating hemostatic valve, and then the device was gently advanced through the microcatheter to the distal end. The retriever was then unsheathed by withdrawing the microcatheter under fluoroscopy. Once the retriever was completely unsheathed, control angiogram was performed from the intermediate catheter. 3 minutes of the stent intercolation. Intermediate catheter was then advanced through the ophthalmic segment into the proximal MCA at the proximal aspect of the solitaire. Constant aspiration was then performed at the tip of the intermediate catheter as the retriever was gently and slowly withdrawn with fluoroscopic observation. Once the retriever was entirely removed from the system, free aspiration was confirmed at the hub of the intermediate catheter, with free blood return confirmed. Significant thrombus was retrieved from the solitaire device. Repeat angiogram was then performed through the intermediate catheter confirming restoration of TICI3 flow. The intermediate catheter was then withdrawn into the cervical ICA, in preparation to address the left ICA stenosis with anticipated stent placement. Exchange length Transcend wire was placed through the intermediate catheter which was then removed. Once the exchange length trans and wire was in place, a control angiogram was performed, and there was attempted placement of our selected 8-6 x 40mm Exact stent. The stent would not pass through the lesion at the proximal common carotid artery. Stent was removed from the wire and repeat balloon angioplasty with 5 mm balloon was performed. Again, the stent would not pass through the lesion. Stent was removed from the wire, and a combination of 018 SV8 wire and the rapid transit were used to place the 018 buddy wire adjacent to the 0.014 Transcend wire. This was successful in straightening the carotid tortuosity, however, when there was attempt made to pass the stent system  with 2 wire in place, the lumen on the flow gate catheter would not accept the buddy wire. The stent system was then removed, and a final balloon angioplasty was performed with 5 mm diameter balloon and the 018 SV 8 wire has a modified cutting balloon technique. Eight atmospheric was achieved (nominal diameter). Final attempt at passing the stent across the lesion was unsuccessful, and attempted stenting was abandoned after discussion with Dr. Amada Jupiter of Stroke Neurology. Final control angiogram of the left carotid system was performed. Control angiogram was performed at the right common femoral artery puncture site, after the 55 centimeter 8 French sheath was exchanged for a standard 9  French sheath at the common femoral artery puncture site. Patient tolerated the procedure well and remained hemodynamically stable throughout. No complications were encountered. Estimated blood loss approximately 150 cc. FINDINGS: Ultrasound survey of the left common femoral artery demonstrates wide patency. Ultrasound survey of the right common femoral artery demonstrates wide patency. Initial images: Right common carotid artery:  Normal course caliber and contour. Right external carotid artery: Patent with antegrade flow. Right internal carotid artery: Significant stenosis at the proximal right internal carotid artery with a string sign. The percent stenosis estimated greater than 73% fine ask at criteria, however, this is favored to be greater given there is decreased caliber of the distal ICA secondary to decreased flow. Right MCA: M1 segment patent. The arterial, capillary/ parenchymal, and venous phase are delayed given the significant stenosis proximally. Contralateral filling of the left anterior cerebral artery with patent anterior communicating artery and large right A1 segment. Right ACA: Large right A1 segment with perfusion of left and right ACA territory. Left common carotid artery: Left common carotid artery origin  is a common or drain from the base of the innominate artery. Mild plaque at the origin. Unremarkable course caliber and contour of the common carotid artery. Left external carotid artery: Patent with antegrade flow. Left internal carotid artery: Advanced calcified and soft plaque at the carotid bifurcation and the left internal carotid artery with occlusion at the initiation of the case. Distal segment is patent, which was proven after crossing of the proximal occlusion. Left MCA: M1 occlusion secondary to thrombus was confirmed on the initial angiogram. Left ACA: Occlusion extended into the left A1 segment, compatible with a T occlusion Final images: After successful thrombectomy, there is restoration of TICI3 flow of the left hemisphere. Restoration of flow through the left A1 segment. Residual high-grade stenosis at the left ICA origin which was recalcitrant to multiple balloon angioplasty up to 5 mm with a cutting balloon technique. Dense plaque at the left ICA origin as well as the geometry of the ICA origin precluded placement of the selected stent. Final image demonstrates flow through the left ICA and left MCA territory PROCEDURE: Cervical cerebral angiogram with left MCA thrombectomy with a combined aspiration and solitaire technique. 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 Restoration of TICI 3 flow was achieved with 1 pass of solitaire device and local aspiration, and flow through the left ICA occlusion was re-established by balloon angioplasty. Dense calcified plaque and the geometry of the proximal left ICA precluded placement of the selected Exact stent, and the patient will remain on medical therapy. Angiogram of the right carotid system demonstrates high-grade stenosis of proximal right ICA secondary to advanced atherosclerotic changes. Although the  angiographic measurement of the string sign is 73% stenosis by NASCET criteria, the degree of stenosis is favored to be greater, as the distal vessel is compromised in diameter given the low flow. Signed, Yvone Neu. Loreta Ave DO [facility] Radiology PLAN: Noncontrast head CT postprocedure. ICU admission. Continue anti-platelet medication. Right common femoral 9 French sheath maintained which will be removed after 12 hours. Left common femoral 4 French sheath maintained for hemodynamic monitoring. Electronically Signed   By: Gilmer Mor D.O.   On: 02/16/2017 11:18   Ir US Guide Vasc Access Right  Result Date: 02/16/2017 INDICATION: 61 year old male with acute right-sided weakness secondary to acute left hemisphere stroke.  CT demonstrates left sided emergent large vessel occlusion of the M1 segment. Last known well 11:30 p.m., and within tPA time frame. IV tPA administered, and given his baseline function, angiogram for mechanical thrombectomy was pursued. CTA demonstrates left ICA occlusion. 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 COMPLICATIONS: None TECHNIQUE: Informed written consent was obtained from the patient's family/wife after a thorough discussion of the procedural risks, benefits and alternatives. Specific risks discussed include: Bleeding, infection, contrast  reaction, kidney injury/failure, need for further procedure/surgery, arterial injury or dissection, embolization to new territory, intracranial hemorrhage (10-15% risk), neurologic deterioration, cardiopulmonary collapse, death. All questions were addressed. Maximal Sterile Barrier Technique was utilized including during the procedure including caps, mask, sterile gowns, sterile gloves, sterile drape, hand hygiene and skin antiseptic. A timeout was performed prior to the initiation of the procedure. Ultrasound survey of the left inguinal region was performed with images stored and sent to PACs. A micropuncture needle was used access the left common femoral artery under ultrasound. With excellent arterial blood flow returned, an .018 micro wire was passed through the needle, observed to enter the abdominal aorta under fluoroscopy. The needle was removed, and a micropuncture sheath was placed over the wire. The inner dilator and wire were removed, and an 035 Bentson wire was advanced under fluoroscopy into the abdominal aorta. The sheath was removed and a standard 4 [place] vascular sheath was placed for hemodynamic monitoring. The dilator was removed and the sheath was flushed. Ultrasound survey of the right inguinal region was performed with images stored and sent to PACs. A micropuncture needle was used access the right common femoral artery under ultrasound. With excellent arterial blood flow returned, an .018 micro wire was passed through the needle, observed to enter the abdominal aorta under fluoroscopy. The needle was removed, and a micropuncture sheath was placed over the wire. The inner dilator and wire were removed, and an 035 Bentson wire was advanced under fluoroscopy into the abdominal aorta. The sheath was removed and a standard 5 [place] vascular sheath was placed for hemodynamic monitoring. The dilator was removed and the sheath was flushed. A 46F JB-1 diagnostic catheter was advanced over the wire to  the proximal descending thoracic aorta. Wire was then removed. Double flush of the catheter was performed. Catheter was then used to select the right common carotid artery. Angiogram was performed of the right-sided cervical and cerebral vasculature. Catheter was then withdrawn into the aortic arch, and the origin of the left common carotid artery was selected. Road map angiogram was performed. Standard Glidewire was used to advanced the catheter into the left common carotid artery. Formal angiogram was then performed, confirming the left common carotid artery occlusion and left MCA occlusion. The Glidewire was then used to advance the JB 1 catheter into the external carotid artery branches. Glidewire was then exchanged for a Corporate investment banker. Exchange length Rosen wire was then passed through the diagnostic catheter to the distal common carotid artery and the diagnostic catheter was removed. The 5 French sheath was removed and exchanged for  8 French 55 centimeter BrightTip sheath. Sheath was flushed and attached to pressurized and heparinized saline bag for constant forward flow. Then an 8 [place], 85 cm Flowgate balloon tip catheter was prepared on the back table with inflation of the balloon with 50/50 concentration of dilute contrast. The balloon catheter was then advanced over the wire, positioned into the distal common carotid artery. Copious back flush was performed and the balloon catheter was attached to heparinized and pressurized saline bag for forward flow. Rapid transit microcatheter and soft tip Transcend wire were then used to probe the origin of the common carotid artery. The micro wiring catheter combination were successful with crossing the acute occlusion, with the wire catheter combination place distally into the left ICA. Wire was removed and back flow blood was confirmed to confirm luminal position. Small contrast injection through the microcatheter was performed. Exchange length Transcend wire was  then placed through the microcatheter, it and a rapid transit was removed over the wire. Balloon angioplasty was then performed at the left ICA occlusion with a 4mm x 30mm Via-Trak rapid exchange angioplasty balloon. Given the recalcitrant stenosis, a 5mm x 30mm balloon was selected with balloon angioplasty to 7 atmosphere. The balloon was then removed, and Provue18 catheter was advanced over the Transcend wire. Transcend wire was removed and a synchro soft micro wire was selected. Micro wire was then advanced into the MCA. The micro wire was then carefully advanced through the occluded segment. Microcatheter would not pass beyond the ophthalmic segment, likely secondary to the incomplete support system with the balloon catheter below the ICA origin. The exchange length Transcend wire was then placed through the microcatheter, and the microcatheter removed. An intermediate CAT 6 was selected, placed over the micro wire into the distal common carotid artery. The Provue 18 catheter was then advanced through the intermediate catheter while observing the distal aspect of the wire. The combination of the microcatheter and the intermediate catheter were successful in navigating to the skullbase through the persisting stenosis at the right ICA origin. Single attempt a passing the balloon catheter into the ICA was unsuccessful. Tip of the balloon catheter remained in the common carotid artery. The extra support from the intermediate catheter was useful in navigating the micro wire and microcatheter through the occlusion of the MCA into parietal branch. Micro wire was then removed. Blood was then aspirated through the hub of the microcatheter, and a gentle contrast injection was performed confirming intraluminal position. A rotating hemostatic valve was then attached to the back end of the microcatheter, and a pressurized and heparinized saline bag was attached to the catheter. 4 x 40 solitaire device was then selected. Back  flush was achieved at the rotating hemostatic valve, and then the device was gently advanced through the microcatheter to the distal end. The retriever was then unsheathed by withdrawing the microcatheter under fluoroscopy. Once the retriever was completely unsheathed, control angiogram was performed from the intermediate catheter. 3 minutes of the stent intercolation. Intermediate catheter was then advanced through the ophthalmic segment into the proximal MCA at the proximal aspect of the solitaire. Constant aspiration was then performed at the tip of the intermediate catheter as the retriever was gently and slowly withdrawn with fluoroscopic observation. Once the retriever was entirely removed from the system, free aspiration was confirmed at the hub of the intermediate catheter, with free blood return confirmed. Significant thrombus was retrieved from the solitaire device. Repeat angiogram was then performed through the intermediate catheter confirming restoration of  TICI3 flow. The intermediate catheter was then withdrawn into the cervical ICA, in preparation to address the left ICA stenosis with anticipated stent placement. Exchange length Transcend wire was placed through the intermediate catheter which was then removed. Once the exchange length trans and wire was in place, a control angiogram was performed, and there was attempted placement of our selected 8-6 x 40mm Exact stent. The stent would not pass through the lesion at the proximal common carotid artery. Stent was removed from the wire and repeat balloon angioplasty with 5 mm balloon was performed. Again, the stent would not pass through the lesion. Stent was removed from the wire, and a combination of 018 SV8 wire and the rapid transit were used to place the 018 buddy wire adjacent to the 0.014 Transcend wire. This was successful in straightening the carotid tortuosity, however, when there was attempt made to pass the stent system with 2 wire in  place, the lumen on the flow gate catheter would not accept the buddy wire. The stent system was then removed, and a final balloon angioplasty was performed with 5 mm diameter balloon and the 018 SV 8 wire has a modified cutting balloon technique. Eight atmospheric was achieved (nominal diameter). Final attempt at passing the stent across the lesion was unsuccessful, and attempted stenting was abandoned after discussion with Dr. Amada Jupiter of Stroke Neurology. Final control angiogram of the left carotid system was performed. Control angiogram was performed at the right common femoral artery puncture site, after the 55 centimeter 8 French sheath was exchanged for a standard 9 French sheath at the common femoral artery puncture site. Patient tolerated the procedure well and remained hemodynamically stable throughout. No complications were encountered. Estimated blood loss approximately 150 cc. FINDINGS: Ultrasound survey of the left common femoral artery demonstrates wide patency. Ultrasound survey of the right common femoral artery demonstrates wide patency. Initial images: Right common carotid artery:  Normal course caliber and contour. Right external carotid artery: Patent with antegrade flow. Right internal carotid artery: Significant stenosis at the proximal right internal carotid artery with a string sign. The percent stenosis estimated greater than 73% fine ask at criteria, however, this is favored to be greater given there is decreased caliber of the distal ICA secondary to decreased flow. Right MCA: M1 segment patent. The arterial, capillary/ parenchymal, and venous phase are delayed given the significant stenosis proximally. Contralateral filling of the left anterior cerebral artery with patent anterior communicating artery and large right A1 segment. Right ACA: Large right A1 segment with perfusion of left and right ACA territory. Left common carotid artery: Left common carotid artery origin is a common or  drain from the base of the innominate artery. Mild plaque at the origin. Unremarkable course caliber and contour of the common carotid artery. Left external carotid artery: Patent with antegrade flow. Left internal carotid artery: Advanced calcified and soft plaque at the carotid bifurcation and the left internal carotid artery with occlusion at the initiation of the case. Distal segment is patent, which was proven after crossing of the proximal occlusion. Left MCA: M1 occlusion secondary to thrombus was confirmed on the initial angiogram. Left ACA: Occlusion extended into the left A1 segment, compatible with a T occlusion Final images: After successful thrombectomy, there is restoration of TICI3 flow of the left hemisphere. Restoration of flow through the left A1 segment. Residual high-grade stenosis at the left ICA origin which was recalcitrant to multiple balloon angioplasty up to 5 mm with a cutting balloon technique. Dense  plaque at the left ICA origin as well as the geometry of the ICA origin precluded placement of the selected stent. Final image demonstrates flow through the left ICA and left MCA territory PROCEDURE: Cervical cerebral angiogram with left MCA thrombectomy with a combined aspiration and solitaire technique. 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 Restoration of TICI 3 flow was achieved with 1 pass of solitaire device and local aspiration, and flow through the left ICA occlusion was re-established by balloon angioplasty. Dense calcified plaque and the geometry of the proximal left ICA precluded placement of the selected Exact stent, and the patient will remain on medical therapy. Angiogram of the right carotid system demonstrates high-grade stenosis of proximal right ICA secondary to advanced atherosclerotic changes. Although the angiographic  measurement of the string sign is 73% stenosis by NASCET criteria, the degree of stenosis is favored to be greater, as the distal vessel is compromised in diameter given the low flow. Signed, Yvone Neu. Loreta Ave DO [facility] Radiology PLAN: Noncontrast head CT postprocedure. ICU admission. Continue anti-platelet medication. Right common femoral 9 French sheath maintained which will be removed after 12 hours. Left common femoral 4 French sheath maintained for hemodynamic monitoring. Electronically Signed   By: Gilmer Mor D.O.   On: 02/16/2017 11:18   Dg Chest Port 1 View  Result Date: 02/17/2017 CLINICAL DATA:  Respiratory failure EXAM: PORTABLE CHEST 1 VIEW COMPARISON:  02/16/2017 FINDINGS: The trachea tube, NG tube unchanged. Stable enlarged cardiac silhouette. There is improvement in central venous congestion. Mild RIGHT basilar atelectasis remains. IMPRESSION: 1. Stable support apparatus. 2. Improved aeration to the lungs with persistent RIGHT basilar atelectasis. Electronically Signed   By: Genevive Bi M.D.   On: 02/17/2017 08:19   Dg Chest Port 1 View  Result Date: 02/16/2017 CLINICAL DATA:  Respiratory failure, intubated EXAM: PORTABLE CHEST 1 VIEW COMPARISON:  None available FINDINGS: Endotracheal tube 4.3 cm above the carina. NG tube within the stomach, tip not visualized. Marked cardiomegaly with central vascular congestion and low lung volumes. Mild streaky edema pattern and basilar atelectasis throughout both lungs. No large effusion or pneumothorax. Trachea is midline. No acute osseous finding. Remote postop changes of the right shoulder. IMPRESSION: Cardiomegaly with mild central vascular congestion and low lung volumes Scattered mild bilateral streaky edema pattern and atelectasis. Support apparatus in good position as above. Electronically Signed   By: Judie Petit.  Shick M.D.   On: 02/16/2017 08:54   Dg Abd Portable 1v  Result Date:  02/16/2017 CLINICAL DATA:  Orogastric tube placement EXAM: PORTABLE ABDOMEN - 1 VIEW COMPARISON:  None. FINDINGS: The tip of the OG tube is in the pyloric region of the stomach and may be in the distal stomach or just transpyloric into the proximal duodenum retrocardiac collapse/consolidation noted left lung base. Visualized abdomen shows nonspecific gas pattern. IMPRESSION: OG tube tip in the region of the pylorus and may be in the duodenal bulb or proximal stomach. Electronically Signed   By: Kennith Center M.D.   On: 02/16/2017 12:20   Ir Percutaneous Art Thrombectomy/infusion Intracranial Inc Diag Angio  Result Date: 02/16/2017 INDICATION: 61 year old male with acute right-sided weakness secondary to acute left hemisphere stroke. CT demonstrates left sided emergent large vessel occlusion of the M1 segment. Last known well 11:30 p.m., and within tPA time frame. IV tPA administered, and given his baseline function,  angiogram for mechanical thrombectomy was pursued. CTA demonstrates left ICA occlusion. 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 COMPLICATIONS: None TECHNIQUE: Informed written consent was obtained from the patient's family/wife after a thorough discussion of the procedural risks, benefits and alternatives. Specific risks discussed include: Bleeding, infection, contrast reaction, kidney injury/failure, need  for further procedure/surgery, arterial injury or dissection, embolization to new territory, intracranial hemorrhage (10-15% risk), neurologic deterioration, cardiopulmonary collapse, death. All questions were addressed. Maximal Sterile Barrier Technique was utilized including during the procedure including caps, mask, sterile gowns, sterile gloves, sterile drape, hand hygiene and skin antiseptic. A timeout was performed prior to the initiation of the procedure. Ultrasound survey of the left inguinal region was performed with images stored and sent to PACs. A micropuncture needle was used access the left common femoral artery under ultrasound. With excellent arterial blood flow returned, an .018 micro wire was passed through the needle, observed to enter the abdominal aorta under fluoroscopy. The needle was removed, and a micropuncture sheath was placed over the wire. The inner dilator and wire were removed, and an 035 Bentson wire was advanced under fluoroscopy into the abdominal aorta. The sheath was removed and a standard 4 [place] vascular sheath was placed for hemodynamic monitoring. The dilator was removed and the sheath was flushed. Ultrasound survey of the right inguinal region was performed with images stored and sent to PACs. A micropuncture needle was used access the right common femoral artery under ultrasound. With excellent arterial blood flow returned, an .018 micro wire was passed through the needle, observed to enter the abdominal aorta under fluoroscopy. The needle was removed, and a micropuncture sheath was placed over the wire. The inner dilator and wire were removed, and an 035 Bentson wire was advanced under fluoroscopy into the abdominal aorta. The sheath was removed and a standard 5 [place] vascular sheath was placed for hemodynamic monitoring. The dilator was removed and the sheath was flushed. A 53F JB-1 diagnostic catheter was advanced over the wire to the proximal descending thoracic aorta.  Wire was then removed. Double flush of the catheter was performed. Catheter was then used to select the right common carotid artery. Angiogram was performed of the right-sided cervical and cerebral vasculature. Catheter was then withdrawn into the aortic arch, and the origin of the left common carotid artery was selected. Road map angiogram was performed. Standard Glidewire was used to advanced the catheter into the left common carotid artery. Formal angiogram was then performed, confirming the left common carotid artery occlusion and left MCA occlusion. The Glidewire was then used to advance the JB 1 catheter into the external carotid artery branches. Glidewire was then exchanged for a Corporate investment banker. Exchange length Rosen wire was then passed through the diagnostic catheter to the distal common carotid artery and the diagnostic catheter was removed. The 5 French sheath was removed and exchanged for 8 French 55 centimeter BrightTip sheath. Sheath was flushed and attached to pressurized and heparinized saline bag for constant forward flow. Then an 8 [place], 85 cm Flowgate balloon tip  catheter was prepared on the back table with inflation of the balloon with 50/50 concentration of dilute contrast. The balloon catheter was then advanced over the wire, positioned into the distal common carotid artery. Copious back flush was performed and the balloon catheter was attached to heparinized and pressurized saline bag for forward flow. Rapid transit microcatheter and soft tip Transcend wire were then used to probe the origin of the common carotid artery. The micro wiring catheter combination were successful with crossing the acute occlusion, with the wire catheter combination place distally into the left ICA. Wire was removed and back flow blood was confirmed to confirm luminal position. Small contrast injection through the microcatheter was performed. Exchange length Transcend wire was then placed through the microcatheter, it  and a rapid transit was removed over the wire. Balloon angioplasty was then performed at the left ICA occlusion with a 4mm x 30mm Via-Trak rapid exchange angioplasty balloon. Given the recalcitrant stenosis, a 5mm x 30mm balloon was selected with balloon angioplasty to 7 atmosphere. The balloon was then removed, and Provue18 catheter was advanced over the Transcend wire. Transcend wire was removed and a synchro soft micro wire was selected. Micro wire was then advanced into the MCA. The micro wire was then carefully advanced through the occluded segment. Microcatheter would not pass beyond the ophthalmic segment, likely secondary to the incomplete support system with the balloon catheter below the ICA origin. The exchange length Transcend wire was then placed through the microcatheter, and the microcatheter removed. An intermediate CAT 6 was selected, placed over the micro wire into the distal common carotid artery. The Provue 18 catheter was then advanced through the intermediate catheter while observing the distal aspect of the wire. The combination of the microcatheter and the intermediate catheter were successful in navigating to the skullbase through the persisting stenosis at the right ICA origin. Single attempt a passing the balloon catheter into the ICA was unsuccessful. Tip of the balloon catheter remained in the common carotid artery. The extra support from the intermediate catheter was useful in navigating the micro wire and microcatheter through the occlusion of the MCA into parietal branch. Micro wire was then removed. Blood was then aspirated through the hub of the microcatheter, and a gentle contrast injection was performed confirming intraluminal position. A rotating hemostatic valve was then attached to the back end of the microcatheter, and a pressurized and heparinized saline bag was attached to the catheter. 4 x 40 solitaire device was then selected. Back flush was achieved at the rotating  hemostatic valve, and then the device was gently advanced through the microcatheter to the distal end. The retriever was then unsheathed by withdrawing the microcatheter under fluoroscopy. Once the retriever was completely unsheathed, control angiogram was performed from the intermediate catheter. 3 minutes of the stent intercolation. Intermediate catheter was then advanced through the ophthalmic segment into the proximal MCA at the proximal aspect of the solitaire. Constant aspiration was then performed at the tip of the intermediate catheter as the retriever was gently and slowly withdrawn with fluoroscopic observation. Once the retriever was entirely removed from the system, free aspiration was confirmed at the hub of the intermediate catheter, with free blood return confirmed. Significant thrombus was retrieved from the solitaire device. Repeat angiogram was then performed through the intermediate catheter confirming restoration of TICI3 flow. The intermediate catheter was then withdrawn into the cervical ICA, in preparation to address the left ICA stenosis with anticipated stent placement. Exchange length Transcend wire was placed  through the intermediate catheter which was then removed. Once the exchange length trans and wire was in place, a control angiogram was performed, and there was attempted placement of our selected 8-6 x 40mm Exact stent. The stent would not pass through the lesion at the proximal common carotid artery. Stent was removed from the wire and repeat balloon angioplasty with 5 mm balloon was performed. Again, the stent would not pass through the lesion. Stent was removed from the wire, and a combination of 018 SV8 wire and the rapid transit were used to place the 018 buddy wire adjacent to the 0.014 Transcend wire. This was successful in straightening the carotid tortuosity, however, when there was attempt made to pass the stent system with 2 wire in place, the lumen on the flow gate  catheter would not accept the buddy wire. The stent system was then removed, and a final balloon angioplasty was performed with 5 mm diameter balloon and the 018 SV 8 wire has a modified cutting balloon technique. Eight atmospheric was achieved (nominal diameter). Final attempt at passing the stent across the lesion was unsuccessful, and attempted stenting was abandoned after discussion with Dr. Amada Jupiter of Stroke Neurology. Final control angiogram of the left carotid system was performed. Control angiogram was performed at the right common femoral artery puncture site, after the 55 centimeter 8 French sheath was exchanged for a standard 9 French sheath at the common femoral artery puncture site. Patient tolerated the procedure well and remained hemodynamically stable throughout. No complications were encountered. Estimated blood loss approximately 150 cc. FINDINGS: Ultrasound survey of the left common femoral artery demonstrates wide patency. Ultrasound survey of the right common femoral artery demonstrates wide patency. Initial images: Right common carotid artery:  Normal course caliber and contour. Right external carotid artery: Patent with antegrade flow. Right internal carotid artery: Significant stenosis at the proximal right internal carotid artery with a string sign. The percent stenosis estimated greater than 73% fine ask at criteria, however, this is favored to be greater given there is decreased caliber of the distal ICA secondary to decreased flow. Right MCA: M1 segment patent. The arterial, capillary/ parenchymal, and venous phase are delayed given the significant stenosis proximally. Contralateral filling of the left anterior cerebral artery with patent anterior communicating artery and large right A1 segment. Right ACA: Large right A1 segment with perfusion of left and right ACA territory. Left common carotid artery: Left common carotid artery origin is a common or drain from the base of the  innominate artery. Mild plaque at the origin. Unremarkable course caliber and contour of the common carotid artery. Left external carotid artery: Patent with antegrade flow. Left internal carotid artery: Advanced calcified and soft plaque at the carotid bifurcation and the left internal carotid artery with occlusion at the initiation of the case. Distal segment is patent, which was proven after crossing of the proximal occlusion. Left MCA: M1 occlusion secondary to thrombus was confirmed on the initial angiogram. Left ACA: Occlusion extended into the left A1 segment, compatible with a T occlusion Final images: After successful thrombectomy, there is restoration of TICI3 flow of the left hemisphere. Restoration of flow through the left A1 segment. Residual high-grade stenosis at the left ICA origin which was recalcitrant to multiple balloon angioplasty up to 5 mm with a cutting balloon technique. Dense plaque at the left ICA origin as well as the geometry of the ICA origin precluded placement of the selected stent. Final image demonstrates flow through the left ICA and  left MCA territory PROCEDURE: Cervical cerebral angiogram with left MCA thrombectomy with a combined aspiration and solitaire technique. 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 Restoration of TICI 3 flow was achieved with 1 pass of solitaire device and local aspiration, and flow through the left ICA occlusion was re-established by balloon angioplasty. Dense calcified plaque and the geometry of the proximal left ICA precluded placement of the selected Exact stent, and the patient will remain on medical therapy. Angiogram of the right carotid system demonstrates high-grade stenosis of proximal right ICA secondary to advanced atherosclerotic changes. Although the angiographic measurement of the string sign is  73% stenosis by NASCET criteria, the degree of stenosis is favored to be greater, as the distal vessel is compromised in diameter given the low flow. Signed, Yvone Neu. Loreta Ave DO [facility] Radiology PLAN: Noncontrast head CT postprocedure. ICU admission. Continue anti-platelet medication. Right common femoral 9 French sheath maintained which will be removed after 12 hours. Left common femoral 4 French sheath maintained for hemodynamic monitoring. Electronically Signed   By: Gilmer Mor D.O.   On: 02/16/2017 11:18   Ct Head Code Stroke W/o Cm  Result Date: 02/16/2017 CLINICAL DATA:  Code stroke.  Right-sided weakness and aphasia EXAM: CT HEAD WITHOUT CONTRAST TECHNIQUE: Contiguous axial images were obtained from the base of the skull through the vertex without intravenous contrast. COMPARISON:  None. FINDINGS: Brain: No mass lesion, intraparenchymal hemorrhage or extra-axial collection. No evidence of acute cortical infarct. Brain parenchyma and CSF-containing spaces are normal for age. Vascular: There is a hyperdense left MCA. Skull: Normal visualized skull base, calvarium and extracranial soft tissues. Sinuses/Orbits: No sinus fluid levels or advanced mucosal thickening. No mastoid effusion. Normal orbits. ASPECTS [facility] Stroke Program Early CT Score) - Ganglionic level infarction (caudate, lentiform nuclei, internal capsule, insula, M1-M3 cortex): 7 - Supraganglionic infarction (M4-M6 cortex): 3 Total score (0-10 with 10 being normal): 10 IMPRESSION: 1. Hyper dense left middle cerebral artery, likely indicating the presence of acute thrombus. 2. ASPECTS is 10. 3. No intracranial hemorrhage. These results were called by telephone at the time of interpretation on 02/16/2017 at 12:53 am to Dr. Ritta Slot, who verbally acknowledged these results. Electronically Signed   By: Deatra Robinson M.D.   On: 02/16/2017 00:55   Ir Pta Non Coro-lower  Extrem  Result Date: 02/16/2017 INDICATION: 61 year old male with acute right-sided weakness secondary to acute left hemisphere stroke. CT demonstrates left sided emergent large vessel occlusion of the M1 segment. Last known well 11:30 p.m., and within tPA time frame. IV tPA administered, and given his baseline function, angiogram for mechanical thrombectomy was pursued. CTA demonstrates left ICA occlusion. 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 COMPLICATIONS: None TECHNIQUE: Informed written consent was obtained from the patient's family/wife after a thorough discussion of the procedural risks, benefits and alternatives. Specific risks discussed include: Bleeding, infection, contrast reaction, kidney injury/failure, need for  further procedure/surgery, arterial injury or dissection, embolization to new territory, intracranial hemorrhage (10-15% risk), neurologic deterioration, cardiopulmonary collapse, death. All questions were addressed. Maximal Sterile Barrier Technique was utilized including during the procedure including caps, mask, sterile gowns, sterile gloves, sterile drape, hand hygiene and skin antiseptic. A timeout was performed prior to the initiation of the procedure. Ultrasound survey of the left inguinal region was performed with images stored and sent to PACs. A micropuncture needle was used access the  left common femoral artery under ultrasound. With excellent arterial blood flow returned, an .018 micro wire was passed through the needle, observed to enter the abdominal aorta under fluoroscopy. The needle was removed, and a micropuncture sheath was placed over the wire. The inner dilator and wire were removed, and an 035 Bentson wire was advanced under fluoroscopy into the abdominal aorta. The sheath was removed and a standard 4 [place] vascular sheath was placed for hemodynamic monitoring. The dilator was removed and the sheath was flushed. Ultrasound survey of the right inguinal region was performed with images stored and sent to PACs. A micropuncture needle was used access the right common femoral artery under ultrasound. With excellent arterial blood flow returned, an .018 micro wire was passed through the needle, observed to enter the abdominal aorta under fluoroscopy. The needle was removed, and a micropuncture sheath was placed over the wire. The inner dilator and wire were removed, and an 035 Bentson wire was advanced under fluoroscopy into the abdominal aorta. The sheath was removed and a standard 5 [place] vascular sheath was placed for hemodynamic monitoring. The dilator was removed and the sheath was flushed. A 59F JB-1 diagnostic catheter was advanced over the wire to the proximal descending thoracic aorta. Wire was then removed. Double flush of the catheter was performed. Catheter was then used to select the right common carotid artery. Angiogram was performed of the right-sided cervical and cerebral vasculature. Catheter was then withdrawn into the aortic arch, and the origin of the left common carotid artery was selected. Road map angiogram was performed. Standard Glidewire was used to advanced the catheter into the left common carotid artery. Formal angiogram was then performed, confirming the left common carotid artery occlusion and left MCA occlusion. The Glidewire was then used to advance the JB  1 catheter into the external carotid artery branches. Glidewire was then exchanged for a Corporate investment banker. Exchange length Rosen wire was then passed through the diagnostic catheter to the distal common carotid artery and the diagnostic catheter was removed. The 5 French sheath was removed and exchanged for 8 French 55 centimeter BrightTip sheath. Sheath was flushed and attached to pressurized and heparinized saline bag for constant forward flow. Then an 8 [place], 85 cm Flowgate balloon tip catheter was prepared on the back table with inflation of the balloon with 50/50 concentration of dilute contrast. The balloon catheter was then advanced over the wire, positioned into the distal common carotid artery. Copious back flush was performed and the balloon catheter was attached to heparinized and pressurized saline bag for forward flow. Rapid transit microcatheter and soft tip Transcend wire were then used to probe the origin of the common carotid artery. The micro wiring catheter combination were successful with crossing the acute occlusion, with the wire catheter combination place distally into the left ICA. Wire was removed and back flow blood was confirmed to confirm luminal position. Small contrast injection through the microcatheter was performed. Exchange length Transcend wire was then placed through the microcatheter, it and a  rapid transit was removed over the wire. Balloon angioplasty was then performed at the left ICA occlusion with a 4mm x 30mm Via-Trak rapid exchange angioplasty balloon. Given the recalcitrant stenosis, a 5mm x 30mm balloon was selected with balloon angioplasty to 7 atmosphere. The balloon was then removed, and Provue18 catheter was advanced over the Transcend wire. Transcend wire was removed and a synchro soft micro wire was selected. Micro wire was then advanced into the MCA. The micro wire was then carefully advanced through the occluded segment. Microcatheter would not pass beyond the  ophthalmic segment, likely secondary to the incomplete support system with the balloon catheter below the ICA origin. The exchange length Transcend wire was then placed through the microcatheter, and the microcatheter removed. An intermediate CAT 6 was selected, placed over the micro wire into the distal common carotid artery. The Provue 18 catheter was then advanced through the intermediate catheter while observing the distal aspect of the wire. The combination of the microcatheter and the intermediate catheter were successful in navigating to the skullbase through the persisting stenosis at the right ICA origin. Single attempt a passing the balloon catheter into the ICA was unsuccessful. Tip of the balloon catheter remained in the common carotid artery. The extra support from the intermediate catheter was useful in navigating the micro wire and microcatheter through the occlusion of the MCA into parietal branch. Micro wire was then removed. Blood was then aspirated through the hub of the microcatheter, and a gentle contrast injection was performed confirming intraluminal position. A rotating hemostatic valve was then attached to the back end of the microcatheter, and a pressurized and heparinized saline bag was attached to the catheter. 4 x 40 solitaire device was then selected. Back flush was achieved at the rotating hemostatic valve, and then the device was gently advanced through the microcatheter to the distal end. The retriever was then unsheathed by withdrawing the microcatheter under fluoroscopy. Once the retriever was completely unsheathed, control angiogram was performed from the intermediate catheter. 3 minutes of the stent intercolation. Intermediate catheter was then advanced through the ophthalmic segment into the proximal MCA at the proximal aspect of the solitaire. Constant aspiration was then performed at the tip of the intermediate catheter as the retriever was gently and slowly withdrawn with  fluoroscopic observation. Once the retriever was entirely removed from the system, free aspiration was confirmed at the hub of the intermediate catheter, with free blood return confirmed. Significant thrombus was retrieved from the solitaire device. Repeat angiogram was then performed through the intermediate catheter confirming restoration of TICI3 flow. The intermediate catheter was then withdrawn into the cervical ICA, in preparation to address the left ICA stenosis with anticipated stent placement. Exchange length Transcend wire was placed through the intermediate catheter which was then removed. Once the exchange length trans and wire was in place, a control angiogram was performed, and there was attempted placement of our selected 8-6 x 40mm Exact stent. The stent would not pass through the lesion at the proximal common carotid artery. Stent was removed from the wire and repeat balloon angioplasty with 5 mm balloon was performed. Again, the stent would not pass through the lesion. Stent was removed from the wire, and a combination of 018 SV8 wire and the rapid transit were used to place the 018 buddy wire adjacent to the 0.014 Transcend wire. This was successful in straightening the carotid tortuosity, however, when there was attempt made to pass the stent system with 2 wire in place, the  lumen on the flow gate catheter would not accept the buddy wire. The stent system was then removed, and a final balloon angioplasty was performed with 5 mm diameter balloon and the 018 SV 8 wire has a modified cutting balloon technique. Eight atmospheric was achieved (nominal diameter). Final attempt at passing the stent across the lesion was unsuccessful, and attempted stenting was abandoned after discussion with Dr. Amada Jupiter of Stroke Neurology. Final control angiogram of the left carotid system was performed. Control angiogram was performed at the right common femoral artery puncture site, after the 55 centimeter 8  French sheath was exchanged for a standard 9 French sheath at the common femoral artery puncture site. Patient tolerated the procedure well and remained hemodynamically stable throughout. No complications were encountered. Estimated blood loss approximately 150 cc. FINDINGS: Ultrasound survey of the left common femoral artery demonstrates wide patency. Ultrasound survey of the right common femoral artery demonstrates wide patency. Initial images: Right common carotid artery:  Normal course caliber and contour. Right external carotid artery: Patent with antegrade flow. Right internal carotid artery: Significant stenosis at the proximal right internal carotid artery with a string sign. The percent stenosis estimated greater than 73% fine ask at criteria, however, this is favored to be greater given there is decreased caliber of the distal ICA secondary to decreased flow. Right MCA: M1 segment patent. The arterial, capillary/ parenchymal, and venous phase are delayed given the significant stenosis proximally. Contralateral filling of the left anterior cerebral artery with patent anterior communicating artery and large right A1 segment. Right ACA: Large right A1 segment with perfusion of left and right ACA territory. Left common carotid artery: Left common carotid artery origin is a common or drain from the base of the innominate artery. Mild plaque at the origin. Unremarkable course caliber and contour of the common carotid artery. Left external carotid artery: Patent with antegrade flow. Left internal carotid artery: Advanced calcified and soft plaque at the carotid bifurcation and the left internal carotid artery with occlusion at the initiation of the case. Distal segment is patent, which was proven after crossing of the proximal occlusion. Left MCA: M1 occlusion secondary to thrombus was confirmed on the initial angiogram. Left ACA: Occlusion extended into the left A1 segment, compatible with a T occlusion Final  images: After successful thrombectomy, there is restoration of TICI3 flow of the left hemisphere. Restoration of flow through the left A1 segment. Residual high-grade stenosis at the left ICA origin which was recalcitrant to multiple balloon angioplasty up to 5 mm with a cutting balloon technique. Dense plaque at the left ICA origin as well as the geometry of the ICA origin precluded placement of the selected stent. Final image demonstrates flow through the left ICA and left MCA territory PROCEDURE: Cervical cerebral angiogram with left MCA thrombectomy with a combined aspiration and solitaire technique. 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 Restoration of TICI 3 flow was achieved with 1 pass of solitaire device and local aspiration, and flow through the left ICA occlusion was re-established by balloon angioplasty. Dense calcified plaque and the geometry of the proximal left ICA precluded placement of the selected Exact stent, and the patient will remain on medical therapy. Angiogram of the right carotid system demonstrates high-grade stenosis of proximal right ICA secondary to advanced atherosclerotic changes. Although the angiographic measurement of the string sign is 73% stenosis by NASCET  criteria, the degree of stenosis is favored to be greater, as the distal vessel is compromised in diameter given the low flow. Signed, Yvone Neu. Loreta Ave DO [facility] Radiology PLAN: Noncontrast head CT postprocedure. ICU admission. Continue anti-platelet medication. Right common femoral 9 French sheath maintained which will be removed after 12 hours. Left common femoral 4 French sheath maintained for hemodynamic monitoring. Electronically Signed   By: Gilmer Mor D.O.   On: 02/16/2017 11:18   Ir Angio Intra Extracran Sel Com  Carotid Innominate Uni R Mod Sed  Result Date: 02/16/2017 INDICATION: 61 year old male with acute right-sided weakness secondary to acute left hemisphere stroke. CT demonstrates left sided emergent large vessel occlusion of the M1 segment. Last known well 11:30 p.m., and within tPA time frame. IV tPA administered, and given his baseline function, angiogram for mechanical thrombectomy was pursued. CTA demonstrates left ICA occlusion. 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 COMPLICATIONS: None TECHNIQUE: Informed written consent was obtained from the patient's family/wife after a thorough discussion of the procedural risks, benefits and alternatives. Specific risks discussed include: Bleeding, infection, contrast reaction, kidney injury/failure, need for further procedure/surgery, arterial injury or dissection, embolization to new territory, intracranial hemorrhage (10-15% risk), neurologic deterioration, cardiopulmonary collapse, death. All questions were addressed. Maximal Sterile Barrier Technique was utilized including during the procedure including caps, mask, sterile gowns, sterile gloves, sterile drape, hand hygiene and skin antiseptic. A timeout was performed prior to the initiation of the procedure. Ultrasound survey of the left inguinal region was performed with images stored and sent to PACs. A micropuncture  needle was used access the left common femoral artery under ultrasound. With excellent arterial blood flow returned, an .018 micro wire was passed through the needle, observed to enter the abdominal aorta under fluoroscopy. The needle was removed, and a micropuncture sheath was placed over the wire. The inner dilator and wire were removed, and an 035 Bentson wire was advanced under fluoroscopy into the abdominal aorta. The sheath was removed and a standard 4 [place] vascular sheath was placed for hemodynamic monitoring. The dilator was removed and the sheath was flushed. Ultrasound survey of the right inguinal region was performed with images stored and sent to PACs. A micropuncture needle was used access the right common femoral artery under ultrasound. With excellent arterial blood flow returned, an .018 micro wire was passed through the needle, observed to enter the abdominal aorta under fluoroscopy. The needle was removed, and a micropuncture sheath was placed over the wire. The inner dilator and wire were removed, and an 035 Bentson wire was advanced under fluoroscopy into the abdominal aorta. The sheath was removed and a standard 5 [place] vascular sheath was placed for hemodynamic monitoring. The dilator was removed and the sheath was flushed. A 2F JB-1 diagnostic catheter was advanced over the wire to the proximal descending thoracic aorta. Wire was then removed. Double flush of the catheter was performed. Catheter was then used to select the right common carotid artery. Angiogram was performed of the right-sided cervical and  cerebral vasculature. Catheter was then withdrawn into the aortic arch, and the origin of the left common carotid artery was selected. Road map angiogram was performed. Standard Glidewire was used to advanced the catheter into the left common carotid artery. Formal angiogram was then performed, confirming the left common carotid artery occlusion and left MCA occlusion. The Glidewire was  then used to advance the JB 1 catheter into the external carotid artery branches. Glidewire was then exchanged for a Corporate investment banker. Exchange length Rosen wire was then passed through the diagnostic catheter to the distal common carotid artery and the diagnostic catheter was removed. The 5 French sheath was removed and exchanged for 8 French 55 centimeter BrightTip sheath. Sheath was flushed and attached to pressurized and heparinized saline bag for constant forward flow. Then an 8 [place], 85 cm Flowgate balloon tip catheter was prepared on the back table with inflation of the balloon with 50/50 concentration of dilute contrast. The balloon catheter was then advanced over the wire, positioned into the distal common carotid artery. Copious back flush was performed and the balloon catheter was attached to heparinized and pressurized saline bag for forward flow. Rapid transit microcatheter and soft tip Transcend wire were then used to probe the origin of the common carotid artery. The micro wiring catheter combination were successful with crossing the acute occlusion, with the wire catheter combination place distally into the left ICA. Wire was removed and back flow blood was confirmed to confirm luminal position. Small contrast injection through the microcatheter was performed. Exchange length Transcend wire was then placed through the microcatheter, it and a rapid transit was removed over the wire. Balloon angioplasty was then performed at the left ICA occlusion with a 4mm x 30mm Via-Trak rapid exchange angioplasty balloon. Given the recalcitrant stenosis, a 5mm x 30mm balloon was selected with balloon angioplasty to 7 atmosphere. The balloon was then removed, and Provue18 catheter was advanced over the Transcend wire. Transcend wire was removed and a synchro soft micro wire was selected. Micro wire was then advanced into the MCA. The micro wire was then carefully advanced through the occluded segment. Microcatheter  would not pass beyond the ophthalmic segment, likely secondary to the incomplete support system with the balloon catheter below the ICA origin. The exchange length Transcend wire was then placed through the microcatheter, and the microcatheter removed. An intermediate CAT 6 was selected, placed over the micro wire into the distal common carotid artery. The Provue 18 catheter was then advanced through the intermediate catheter while observing the distal aspect of the wire. The combination of the microcatheter and the intermediate catheter were successful in navigating to the skullbase through the persisting stenosis at the right ICA origin. Single attempt a passing the balloon catheter into the ICA was unsuccessful. Tip of the balloon catheter remained in the common carotid artery. The extra support from the intermediate catheter was useful in navigating the micro wire and microcatheter through the occlusion of the MCA into parietal branch. Micro wire was then removed. Blood was then aspirated through the hub of the microcatheter, and a gentle contrast injection was performed confirming intraluminal position. A rotating hemostatic valve was then attached to the back end of the microcatheter, and a pressurized and heparinized saline bag was attached to the catheter. 4 x 40 solitaire device was then selected. Back flush was achieved at the rotating hemostatic valve, and then the device was gently advanced through the microcatheter to the distal end. The retriever was then unsheathed by  withdrawing the microcatheter under fluoroscopy. Once the retriever was completely unsheathed, control angiogram was performed from the intermediate catheter. 3 minutes of the stent intercolation. Intermediate catheter was then advanced through the ophthalmic segment into the proximal MCA at the proximal aspect of the solitaire. Constant aspiration was then performed at the tip of the intermediate catheter as the retriever was gently and  slowly withdrawn with fluoroscopic observation. Once the retriever was entirely removed from the system, free aspiration was confirmed at the hub of the intermediate catheter, with free blood return confirmed. Significant thrombus was retrieved from the solitaire device. Repeat angiogram was then performed through the intermediate catheter confirming restoration of TICI3 flow. The intermediate catheter was then withdrawn into the cervical ICA, in preparation to address the left ICA stenosis with anticipated stent placement. Exchange length Transcend wire was placed through the intermediate catheter which was then removed. Once the exchange length trans and wire was in place, a control angiogram was performed, and there was attempted placement of our selected 8-6 x 40mm Exact stent. The stent would not pass through the lesion at the proximal common carotid artery. Stent was removed from the wire and repeat balloon angioplasty with 5 mm balloon was performed. Again, the stent would not pass through the lesion. Stent was removed from the wire, and a combination of 018 SV8 wire and the rapid transit were used to place the 018 buddy wire adjacent to the 0.014 Transcend wire. This was successful in straightening the carotid tortuosity, however, when there was attempt made to pass the stent system with 2 wire in place, the lumen on the flow gate catheter would not accept the buddy wire. The stent system was then removed, and a final balloon angioplasty was performed with 5 mm diameter balloon and the 018 SV 8 wire has a modified cutting balloon technique. Eight atmospheric was achieved (nominal diameter). Final attempt at passing the stent across the lesion was unsuccessful, and attempted stenting was abandoned after discussion with Dr. Amada Jupiter of Stroke Neurology. Final control angiogram of the left carotid system was performed. Control angiogram was performed at the right common femoral artery puncture site, after  the 55 centimeter 8 French sheath was exchanged for a standard 9 French sheath at the common femoral artery puncture site. Patient tolerated the procedure well and remained hemodynamically stable throughout. No complications were encountered. Estimated blood loss approximately 150 cc. FINDINGS: Ultrasound survey of the left common femoral artery demonstrates wide patency. Ultrasound survey of the right common femoral artery demonstrates wide patency. Initial images: Right common carotid artery:  Normal course caliber and contour. Right external carotid artery: Patent with antegrade flow. Right internal carotid artery: Significant stenosis at the proximal right internal carotid artery with a string sign. The percent stenosis estimated greater than 73% fine ask at criteria, however, this is favored to be greater given there is decreased caliber of the distal ICA secondary to decreased flow. Right MCA: M1 segment patent. The arterial, capillary/ parenchymal, and venous phase are delayed given the significant stenosis proximally. Contralateral filling of the left anterior cerebral artery with patent anterior communicating artery and large right A1 segment. Right ACA: Large right A1 segment with perfusion of left and right ACA territory. Left common carotid artery: Left common carotid artery origin is a common or drain from the base of the innominate artery. Mild plaque at the origin. Unremarkable course caliber and contour of the common carotid artery. Left external carotid artery: Patent with antegrade flow. Left internal  carotid artery: Advanced calcified and soft plaque at the carotid bifurcation and the left internal carotid artery with occlusion at the initiation of the case. Distal segment is patent, which was proven after crossing of the proximal occlusion. Left MCA: M1 occlusion secondary to thrombus was confirmed on the initial angiogram. Left ACA: Occlusion extended into the left A1 segment, compatible with a  T occlusion Final images: After successful thrombectomy, there is restoration of TICI3 flow of the left hemisphere. Restoration of flow through the left A1 segment. Residual high-grade stenosis at the left ICA origin which was recalcitrant to multiple balloon angioplasty up to 5 mm with a cutting balloon technique. Dense plaque at the left ICA origin as well as the geometry of the ICA origin precluded placement of the selected stent. Final image demonstrates flow through the left ICA and left MCA territory PROCEDURE: Cervical cerebral angiogram with left MCA thrombectomy with a combined aspiration and solitaire technique. 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 Restoration of TICI 3 flow was achieved with 1 pass of solitaire device and local aspiration, and flow through the left ICA occlusion was re-established by balloon angioplasty. Dense calcified plaque and the geometry of the proximal left ICA precluded placement of the selected Exact stent, and the patient will remain on medical therapy. Angiogram of the right carotid system demonstrates high-grade stenosis of proximal right ICA secondary to advanced atherosclerotic changes. Although the angiographic measurement of the string sign is 73% stenosis by NASCET criteria, the degree of stenosis is favored to be greater, as the distal vessel is compromised in diameter given the low flow. Signed, Yvone Neu. Loreta Ave DO [facility] Radiology PLAN: Noncontrast head CT postprocedure. ICU admission. Continue anti-platelet medication. Right common femoral 9 French sheath maintained which will be removed after 12 hours. Left common femoral 4 French sheath maintained for hemodynamic monitoring. Electronically Signed   By: Gilmer Mor D.O.   On: 02/16/2017 11:18     Labs:  CBC:  Recent Labs  02/16/17 0038 02/16/17 0050 02/17/17 0233 02/18/17 0223 02/19/17 0428  WBC 6.6  --  7.0 9.3 7.0  HGB 13.2 13.3 10.5* 11.4* 12.2*  HCT 38.2* 39.0 32.0* 33.7* 35.9*  PLT 207  --  140* 154 151    COAGS:  Recent Labs  02/16/17 0038  INR 0.97  APTT 22*    BMP:  Recent Labs  02/16/17 0038 02/16/17 0050 02/17/17 0233 02/18/17 0223 02/19/17 0428  NA 136 138 140 139 139  K 3.7 3.6 3.6 3.5 3.9  CL 102 101 105 104 102  CO2 23  --  26 26 26   GLUCOSE 206* 207* 107* 120* 112*  BUN 22* 24* 8 9 15   CALCIUM 9.3  --  8.9 9.5 9.2  CREATININE 0.97 1.00 0.71 0.73 0.76  GFRNONAA >60  --  >60 >60 >60  GFRAA >60  --  >60 >60 >60    LIVER FUNCTION TESTS:  Recent Labs  02/16/17 0038  BILITOT 0.6  AST 30  ALT 29  ALKPHOS 33*  PROT 6.9  ALBUMIN 4.4    Assessment and Plan:  Left ICA CVA, 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  Plavix has been added in addition to aspirin.  Will plan for  elective right carotid artery angioplasty stenting at the end of rehabilitation stay.  Electronically Signed: Gwynneth Macleod PA-C 02/19/2017, 10:04 AM   I spent a total of 15 Minutes at the the patient's bedside AND on the patient's hospital floor or unit, greater than 50% of which was counseling/coordinating care for f/u after cerebral intervention

## 2017-02-19 NOTE — Progress Notes (Signed)
Occupational Therapy Treatment Patient Details Name: Erik [name] MRN: [id number] DOB: August 13, 1956 Today's Date: 02/19/2017    History of present illness 61 y.o. male admitted to [facility] on 02/16/17 for L MCA CVA s/p tPA and percutaneous thrombectomy.  Bil high grade ICA stenosis.  Pt with significant PMhx of HLD, bil knee surgery, and R RTC repair.   OT comments  This 61 yo male admitted with above presents to acute OT today with making progress on use of his RUE and is very motivated to do so. He will continue to benefit from acute OT with follow up OT on CIR.   Follow Up Recommendations  CIR    Equipment Recommendations  Other (comment) (TBD at next venue)    Recommendations for Other Services Rehab consult    Precautions / Restrictions Precautions Precautions: Fall Precaution Comments: due to right sided weakness Restrictions Weight Bearing Restrictions: No       Mobility Bed Mobility               General bed mobility comments: Pt up in recliner upon my arrival  Transfers Overall transfer level: Needs assistance Equipment used: 2 person hand held assist Transfers: Sit to/from Stand;Stand Pivot Transfers Sit to Stand: Min assist Stand pivot transfers: Mod assist       General transfer comment: Pt had his shoes on today, which made him more unstable than yesterday. Pt need VCs of which foot to start forward with and backward with; otherwise his right foot would lag behind    Balance Overall balance assessment: Needs assistance Sitting-balance support: No upper extremity supported;Feet supported Sitting balance-Leahy Scale: Good     Standing balance support: Bilateral upper extremity supported Standing balance-Leahy Scale: Poor Standing balance comment: reliant on having both hands on my arms for stepping                           ADL either performed or assessed with clinical judgement   ADL Overall ADL's : Needs assistance/impaired                      Lower Body Dressing: Moderate assistance Lower Body Dressing Details (indicate cue type and reason): Able with increased time to get his socks off and on; as well as his shoes with as shoe horn. Pt paying more attention to how his left hand is functioning today so he is less likely to lose grip                     Vision Baseline Vision/History: Wears glasses Wears Glasses: Reading only Patient Visual Report: No change from baseline Additional Comments: Pt unable to read sentence today, when I had him name the letters in words, he would say the wrong letter(s) at times; once he got all the letters in a word he then 75% of the time be able to then read the word          Cognition Arousal/Alertness: Awake/alert Behavior During Therapy:  ( mosly flat, but did crack a smile a couple of times) Overall Cognitive Status: Impaired/Different from baseline Area of Impairment: Following commands;Safety/judgement;Problem solving                       Following Commands: Follows one step commands inconsistently Safety/Judgement: Decreased awareness of safety;Decreased awareness of deficits   Problem Solving: Slow processing;Requires verbal cues;Requires tactile cues General Comments: Pt is  slow to answer questions and at time innacurate with his answers. See in vision about pt's reading (feel it is a combination of vision and cognition)        Exercises Other Exercises Other Exercises: educated pt and wife on FM activity sheet and theraputty activity sheet--had pt practice the activies on them. Educated them that pt needs to do these throughout the day making sure he does different ones each time. Also had pt stand at window and place items on window sill up and down to where window latches with RUE.           Pertinent Vitals/ Pain       Pain Assessment: No/denies pain         Frequency  Min 3X/week        Progress Toward Goals  OT  Goals(current goals can now be found in the care plan section)  Progress towards OT goals: Progressing toward goals     Plan Discharge plan remains appropriate       End of Session Equipment Utilized During Treatment: Gait belt  OT Visit Diagnosis: Unsteadiness on feet (R26.81);Muscle weakness (generalized) (M62.81);Cognitive communication deficit (R41.841);Hemiplegia and hemiparesis Symptoms and signs involving cognitive functions: Cerebral infarction Hemiplegia - Right/Left: Right Hemiplegia - dominant/non-dominant: Dominant Hemiplegia - caused by: Cerebral infarction   Activity Tolerance Patient tolerated treatment well   Patient Left in chair;with call bell/phone within reach;with family/visitor present           Time: 1020-1100 OT Time Calculation (min): 40 min  Charges: OT General Charges $OT Visit: 1 Procedure OT Treatments $Self Care/Home Management : 8-22 mins $Therapeutic Activity: 23-37 mins  Ignacia Palma, OTR/L [phone] 02/19/2017

## 2017-02-19 NOTE — Progress Notes (Signed)
Physical Therapy Treatment Patient Details Name: Erik [name] MRN: [id number] DOB: Sep 09, 1956 Today's Erik [name]    History of Present Illness 61 y.o. 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  Bil high grade ICA stenosis.  Pt with significant PMhx of HLD, bil knee surgery, and R RTC repair.    PT Comments    Pt is making steady progress toward goals. Continues to need assistance with mobility for safety and decreased fall risk. Acute PT to continue.   Follow Up Recommendations  CIR     Equipment Recommendations  Rolling walker with 5" wheels;3in1 (PT);Wheelchair (measurements PT);Wheelchair cushion (measurements PT)    Recommendations for Other Services Rehab consult     Precautions / Restrictions Precautions Precautions: Fall Precaution Comments: due to right sided weakness Restrictions Weight Bearing Restrictions: No    Mobility  Bed Mobility               General bed mobility comments: not observed- pt in chair before and after session  Transfers Overall transfer level: Needs assistance Equipment used: Rolling walker (2 wheeled);None Transfers: Sit to/from Stand Sit to Stand: Min assist Stand pivot transfers: Mod assist       General transfer comment: cues needed on sequencing and hand placement sit/stand x 2 reps. 1st rep to RW for gait, 2cd rep with on AD for balance training  Ambulation/Gait Ambulation/Gait assistance: Min assist;Mod assist Ambulation Distance (Feet): 25 Feet Assistive device: Rolling walker (2 wheeled) Gait Pattern/deviations: Step-through pattern;Decreased dorsiflexion - left;Decreased step length - right;Decreased step length - left;Decreased stance time - left;Trunk flexed;Narrow base of support Gait velocity: decreased Gait velocity interpretation: Below normal speed for age/gender General Gait Details: cues needed on posture and step length with gait. cues/facilitation for weight  shifting, walker management and posture. increased assist for balance needed with 180 degree turns x 2 with RW.     Modified Rankin (Stroke Patients Only) Modified Rankin (Stroke Patients Only) Pre-Morbid Rankin Score: No symptoms Modified Rankin: Moderately severe disability     Balance Overall balance assessment: Needs assistance Sitting-balance support: No upper extremity supported;Feet supported Sitting balance-Leahy Scale: Good     Standing balance support: No upper extremity supported;During functional activity Standing balance-Leahy Scale: Poor Standing balance comment: no UE support for the following: feet apart EC no head movements progressing to EC head movements left<>right and up<>down with min assist for balance; feet together: EC no head movements with min<>mod assist for balance. Pt needed UE support to move feet to obtain positions.                             Cognition Arousal/Alertness: Awake/alert Behavior During Therapy: Flat affect;Impulsive (impulsive at times, cues to slow down and wait for PTA to set up) Overall Cognitive Status: Impaired/Different from baseline Area of Impairment: Following commands;Safety/judgement;Problem solving             Following Commands: Follows one step commands inconsistently Safety/Judgement: Decreased awareness of safety;Decreased awareness of deficits   Problem Solving: Slow processing;Requires verbal cues;Requires tactile cues General Comments: increased time needed to answer some questions, with pt's first response not always accurate. does look to his spouse at times for assist with answers      Exercises Other Exercises Other Exercises: educated pt and wife on FM activity sheet and theraputty activity sheet--had pt practice the activies on them. Educated them that pt needs to do  these throughout the day making sure he does different ones each time. Also had pt stand at window and place items on window sill up and  down to where window latches with RUE.     Pertinent Vitals/Pain Pain Assessment: 0-10     PT Goals (current goals can now be found in the care plan section) Acute Rehab PT Goals Patient Stated Goal: to be able to walk again PT Goal Formulation: With patient/family Time For Goal Achievement: 03/03/17 Potential to Achieve Goals: Good Progress towards PT goals: Progressing toward goals    Frequency    Min 4X/week      PT Plan Current plan remains appropriate    End of Session Equipment Utilized During Treatment: Gait belt Activity Tolerance: Patient tolerated treatment well Patient left: in chair;with call bell/phone within reach;with family/visitor present Nurse Communication: Mobility status PT Visit Diagnosis: Hemiplegia and hemiparesis;Difficulty in walking, not elsewhere classified (R26.2);Muscle weakness (generalized) (M62.81);Other abnormalities of gait and mobility (R26.89) Hemiplegia - Right/Left: Right Hemiplegia - dominant/non-dominant: Dominant Hemiplegia - caused by: Cerebral infarction     Time: 1420-1440 PT Time Calculation (min) (ACUTE ONLY): 20 min  Charges:  $Gait Training: 8-22 mins                    Sallyanne Kuster, PTA, [facility] [address], [address] [place], [place] [address] [phone] 02/19/17, 2:52 PM    Sallyanne Kuster 02/19/2017, 2:50 PM

## 2017-02-19 NOTE — Progress Notes (Signed)
Presented the STROKE-AF Research study to patient and family. Questions encouraged and answered. ICF left for review. I will return in morning to follow up.

## 2017-02-19 NOTE — Progress Notes (Signed)
Patient has [employer]: ID: [id number] Group: [id number]

## 2017-02-20 ENCOUNTER — Encounter: Payer: Self-pay | Admitting: *Deleted

## 2017-02-20 DIAGNOSIS — Z006 Encounter for examination for normal comparison and control in clinical research program: Secondary | ICD-10-CM

## 2017-02-20 LAB — GLUCOSE, CAPILLARY
GLUCOSE-CAPILLARY: 114 mg/dL — AB (ref 65–99)
Glucose-Capillary: 164 mg/dL — ABNORMAL HIGH (ref 65–99)
Glucose-Capillary: 134 mg/dL — ABNORMAL HIGH (ref 65–99)
Glucose-Capillary: 109 mg/dL — ABNORMAL HIGH (ref 65–99)

## 2017-02-20 LAB — CBC
HCT: 34.9 % — ABNORMAL LOW (ref 39.0–52.0)
HEMOGLOBIN: 12.1 g/dL — AB (ref 13.0–17.0)
MCH: 30.9 pg (ref 26.0–34.0)
MCHC: 34.7 g/dL (ref 30.0–36.0)
MCV: 89 fL (ref 78.0–100.0)
PLATELETS: 170 10*3/uL (ref 150–400)
RBC: 3.92 MIL/uL — ABNORMAL LOW (ref 4.22–5.81)
RDW: 12.6 % (ref 11.5–15.5)
WBC: 6.3 10*3/uL (ref 4.0–10.5)

## 2017-02-20 LAB — MAGNESIUM: MAGNESIUM: 2 mg/dL (ref 1.7–2.4)

## 2017-02-20 LAB — PHOSPHORUS: PHOSPHORUS: 4.4 mg/dL (ref 2.5–4.6)

## 2017-02-20 MED ORDER — MELATONIN 3 MG PO TABS
3.0000 mg | ORAL_TABLET | Freq: Every evening | ORAL | Status: DC | PRN
  Administered 2017-02-20 – 2017-02-21 (×2): 3 mg via ORAL
  Filled 2017-02-20 (×3): qty 1

## 2017-02-20 NOTE — Progress Notes (Signed)
Rehab admissions - I received a call back from [employer] carrier telling me that they could now initiate my request for acute inpatient rehab admission.  I have opened the case with [employer].  I will follow up again in am.  [phone]

## 2017-02-20 NOTE — Progress Notes (Addendum)
Rehab admissions - I have received several calls from family.  The patient was listed as self pay on face sheet, but has [place].  When we called [employer] initially, they told me his policy had termed.  However, the family worked with [place] today and is policy in current with [place].  I will open the case and request acute inpatient rehab admission for patient.  Call me for questions.  #[phone]  I called the precert department and we cannot open the case for 24 hours due to insurance being updated today.  I will try to open the case in am.  [phone]

## 2017-02-20 NOTE — Evaluation (Signed)
Speech Language Pathology Evaluation Patient Details Name: Erik [name] MRN: [id number] DOB: Feb 04, 1956 Today's Date: 02/20/2017 Time: 6213-0865 SLP Time Calculation (min) (ACUTE ONLY): 40 min  Problem List:  Patient Active Problem List   Diagnosis Date Noted  . Benign prostatic hyperplasia   . Status post total bilateral knee replacement   . Benign essential HTN   . Right-sided extracranial carotid artery stenosis   . Dysphagia, post-stroke   . Acute blood loss anemia   . Nocturia   . Newly diagnosed diabetes (HCC)   . CVA (cerebral vascular accident) (HCC) 02/16/2017  . Anxiety 04/30/2015  . Benign fibroma of prostate 04/30/2015  . Clinical depression 04/30/2015  . Acid reflux 04/30/2015  . HLD (hyperlipidemia) 04/30/2015  . BP (high blood pressure) 04/30/2015  . Arthritis, degenerative 04/30/2015  . 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 Arthritis, septic, knee (HCC) 09/26/2012   Past Medical History:  Past Medical History:  Diagnosis Date  . Anxiety   . BPH (benign prostatic hyperplasia)   . ED (erectile dysfunction)   . H/O calcium pyrophosphate deposition disease (CPPD)   . HLD (hyperlipidemia)   . Hypogonadism in male   . Lower urinary tract symptoms (LUTS)   . Nocturia   . Testosterone overdose   . Vitamin D deficiency    Past Surgical History:  Past Surgical History:  Procedure Laterality Date  . BACK SURGERY    . IR ANGIO INTRA EXTRACRAN SEL COM CAROTID INNOMINATE UNI R MOD SED  02/16/2017  . IR PERCUTANEOUS ART THROMBECTOMY/INFUSION INTRACRANIAL INC DIAG ANGIO  02/16/2017  . IR PTA NON CORO-LOWER EXTREM  02/16/2017  . IR US GUIDE VASC ACCESS LEFT  02/16/2017  . IR US GUIDE VASC ACCESS RIGHT  02/16/2017  . KNEE ARTHROSCOPY Bilateral   . RADIOLOGY WITH ANESTHESIA N/A 02/16/2017   Procedure: RADIOLOGY WITH ANESTHESIA;  Surgeon: Julieanne Cotton, MD;  Location: [facility] OR;  Service: Radiology;  Laterality: N/A;  . ROTATOR CUFF REPAIR Right     HPI:  PT is a 61 y.o. male admitted to [facility] on 02/16/17 for L MCA CVA s/p tPA and percutaneous thrombectomy. Bil high grade ICA stenosis. Pt with significant PMhx of HLD, bil knee surgery, and R RTC repair. Pt intubated 4-13 with extubation this date.  Pt found to have left basal ganglia, left temporal cva.   Pt with dysarthria and aphasia.    Assessment / Plan / Recommendation Clinical Impression  Pt presents with mild expressive language deficits and dysarthria.  Language deficits c/b word finding difficulty - use of written choice faciliates language acquisition.   Moderate dysarthria b/c imprecise articulation and decreased phonatory strength along with baseline rapid rate of speech decreases intelligibility.  Pt however compensates well with listener subtle cue to improve speech intelligibilty by slowing rate.    Scoring on MOCA likely impacted by pt's language deficits - pt demonstrated good attention skills and repetition but fluency decreased and word recall severely impaired. Pt scored 14/25 - written portion not administered due to pt's weak limb.   Pt will benefit from SlP to maximize functional cognitive linguistic skills to decrease caregiver burden.  Using teach back, skilled intervention included educating pt/wife to findings of test and compensation strategies to use.  Pt will benefit from CIR.     SLP Assessment  SLP Recommendation/Assessment: Patient needs continued Speech Lanaguage Pathology Services SLP Visit Diagnosis: Dysarthria and anarthria (R47.1)    Follow Up Recommendations  Inpatient Rehab    Frequency  and Duration min 2x/week  2 weeks      SLP Evaluation Cognition  Arousal/Alertness: Awake/alert Orientation Level: Oriented X4 Attention: Sustained Sustained Attention: Appears intact Memory: Impaired Memory Impairment: Storage deficit;Retrieval deficit (suspect language impairment impacted this greatly) Awareness: Appears intact Safety/Judgment: Appears  intact Comments: mental math - correctly 93/100 - could not correctly answer question;        Comprehension  Auditory Comprehension Overall Auditory Comprehension: Appears within functional limits for tasks assessed Yes/No Questions: Not tested Commands: Within Functional Limits Conversation: Complex Interfering Components: Attention EffectiveTechniques: Extra processing time Visual Recognition/Discrimination Discrimination: Not tested Reading Comprehension Reading Status:  (pt read calendar easily)    Expression Expression Primary Mode of Expression: Verbal Verbal Expression Overall Verbal Expression: Impaired Initiation: No impairment Automatic Speech: Counting;Month of year Level of Generative/Spontaneous Verbalization: Sentence Repetition: No impairment Naming: Impairment Responsive: Not tested Confrontation:  (2/3 correct, 1/3 with written choice of 2) Verbal Errors: Aware of errors (pt independently describing items ) Pragmatics: No impairment Interfering Components: Speech intelligibility Effective Techniques: Written cues Written Expression Dominant Hand: Right Written Expression: Exceptions to [facility] (motor task was difficult for pt to write name, able to print)   Oral / Motor  Oral Motor/Sensory Function Overall Oral Motor/Sensory Function: Moderate impairment Facial ROM: Reduced right;Suspected CN VII (facial) dysfunction Facial Symmetry: Abnormal symmetry right;Suspected CN VII (facial) dysfunction Facial Strength: Reduced right;Suspected CN VII (facial) dysfunction Facial Sensation: Reduced right Lingual ROM: Reduced right Lingual Symmetry: Abnormal symmetry right;Suspected CN XII (hypoglossal) dysfunction Lingual Strength: Reduced Lingual Sensation: Reduced;Suspected CN VII (facial) dysfunction-anterior 2/3 tongue Velum: Within Functional Limits Mandible: Impaired Motor Speech Overall Motor Speech: Impaired Phonation: Low vocal intensity Resonance: Within  functional limits Articulation: Impaired Level of Impairment: Phrase Intelligibility: Intelligibility reduced Word: 75-100% accurate Phrase: 75-100% accurate Sentence: 50-74% accurate Conversation: 50-74% accurate Motor Planning: Witnin functional limits Motor Speech Errors: Not applicable Effective Techniques: Slow rate;Over-articulate   GO                    Chales Abrahams 02/20/2017, 10:01 AM  Donavan Burnet, MS [facility] SLP [phone]

## 2017-02-20 NOTE — Progress Notes (Signed)
STROKE-AF Research study follow up. I have attempted to talk with patient and family two times this morning. I was asked to come back later they were having Insurance problems and on phone working with that.

## 2017-02-20 NOTE — Progress Notes (Signed)
Rehab admissions - I have met with wife and patient at the bedside.  I gave them rehab booklets and updated them on [employer] issue.  I will need to be able to open the case, get pending reference number and get authorization before admit to CIR.  I will try again in the morning.  Call me for questions.  #[phone]

## 2017-02-20 NOTE — Progress Notes (Addendum)
LCSW following for disposition. LCSW  Following as CIR is first recommendation and if CIR is unable to accept, SNF is second option.  Currently, patient has one bed offer. [facility] was contacted to run insurance to understand [employer] benefit as family as brought in insurance card. Family remains to want CIR as first choice and frustrated regarding SNF, however LCSW will continue to support.  Per progression, patient running temperature and received medication. Other barrier resolved with passar:  This has been obtained.  Plan:  TBD LCSW will continue to follow with CIR and final decision and follow up with wife regarding bed offers and insurance coverage once call returned.  Call returned from [facility]:  Patient is lacking insurance coverage as it was terminated on 01/24/17.  LCSW will need to discuss with family and patient regarding next steps, other coverage and discharge plan.  Deretha Emory, MSW Clinical Social Work: Optician, dispensing Coverage for :  [phone]

## 2017-02-20 NOTE — Progress Notes (Signed)
STROKE TEAM PROGRESS NOTE   SUBJECTIVE (INTERVAL HISTORY) Wife at the bedside. He is up in the chair. Dr. Pearlean Brownie discussed plan for IP rehab followed by stent placement post CIR. He does not want to participate in stroke Afib trial   OBJECTIVE Temp:  [98.2 F (36.8 C)-99.5 F (37.5 C)] 98.4 F (36.9 C) (04/17 1424) Pulse Rate:  [61-70] 64 (04/17 1424) Cardiac Rhythm: Normal sinus rhythm (04/17 0750) Resp:  [18-20] 20 (04/17 1424) BP: (122-132)/(57-66) 125/57 (04/17 1424) SpO2:  [95 %-99 %] 99 % (04/17 1424) Weight:  [219 lb 12.8 oz (99.7 kg)] 219 lb 12.8 oz (99.7 kg) (04/17 0553)   Recent Labs Lab 02/19/17 1139 02/19/17 1647 02/19/17 2104 02/20/17 0615 02/20/17 1131  GLUCAP 148* 101* 97 114* 164*    Recent Labs Lab 02/16/17 0038 02/16/17 0050 02/17/17 0233 02/18/17 0223 02/19/17 0428 02/20/17 0547  NA 136 138 140 139 139  --   K 3.7 3.6 3.6 3.5 3.9  --   CL 102 101 105 104 102  --   CO2 23  --  --   GLUCOSE 206* 207* 107* 120* 112*  --   BUN 22* 24* --   CREATININE 0.97 1.00 0.71 0.73 0.76  --   CALCIUM 9.3  --  8.9 9.5 9.2  --   MG  --   --  2.0 2.0 2.1 2.0  PHOS  --   --  3.2 3.2 3.4 4.4    Recent Labs Lab 02/16/17 0038  AST 30  ALT 29  ALKPHOS 33*  BILITOT 0.6  PROT 6.9  ALBUMIN 4.4    Recent Labs Lab 02/16/17 0038 02/16/17 0050 02/17/17 0233 02/18/17 0223 02/19/17 0428 02/20/17 0547  WBC 6.6  --  7.0 9.3 7.0 6.3  NEUTROABS 3.3  --   --   --   --   --   HGB 13.2 13.3 10.5* 11.4* 12.2* 12.1*  HCT 38.2* 39.0 32.0* 33.7* 35.9* 34.9*  MCV 91.0  --  92.2 90.3 90.0 89.0  PLT 207  --  140* 154 151 170     PHYSICAL EXAM General - Well nourished, well developed,  Caucasian obese male not in distress. Ophthalmologic - fundi not visualized  Cardiovascular - Regular rate and rhythm.  Neuro - Awake alert mild expressive aphasia with hesitant speech but good comprehension and naming.PERRL, EOMI, blinking to visual threat on the  left but not on the right. Facial symmetry difficult to test due to ET. LUE and LLE purposeful movement and against gravity, RUE proximal 3-/5, finger movement 0/5, RLE 3/5 proximal and 2/5 distally. DTR 1+ and no babinski bilaterally. Sensation, coordination not cooperative. Gait not tested.    ASSESSMENT/PLAN Mr. EWING FANDINO is a 61 y.o. male with history of HTN, HLD admitted for aphasia and right side weakness. Received tPA and endovascular intervention.    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 Failed attempt left ICA stenting. Infarct secondary to large vessel disease source given bilateral ICA high grade stenosis with severely calcified plaques  Code stroke CT hyperdense L MCA. Aspects 10.  CTA head and neck showed left ICA and left M1 occlusion, right ICA proximal at least 90% stenosis  Cerebral angio tandem L ICA and MCA occlusion with angioplasty and mechanical throbectomy. TICI 3. (solitaire 1 pass). High grade R ICA stenosis 73% with string sign. Attempted  stent placement  MRI left MCA patchy infarcts with small hemorrhagic transformation  MRA left ICA not visualized but reconstituted at cavernous portion, left MCA patent  CUS - There is 80-99% right ICA stenosis.  The left ICA appears occluded.  2D Echo  EF 55-60%, no SOE. Grade 2 DD  LDL 78  HgbA1c 7.0  Heparin subq for VTE prophylaxis  Diet Carb Modified Fluid consistency: Thin; Room service appropriate? Yes   No antithrombotic prior to admission, now on ASA and plavix given large vessel disease. Plan x 3 months then plavix alone. May be altered based on potential stent placement  Ongoing aggressive stroke risk factor management  Therapy recommendations:  CIR. medically ready for discharge, await bed.  Disposition:  Pending  b/l ICA stenosis  Left ICA occlusion on CTA but now likely high grade stenosis post intervention  MRA no  visualization of left ICA but reconstituted at cavernous portion  CUS - left ICA appears occluded.  Failed left ICA stenting  Dr. Pearlean Brownie discussed with Dr. Corliss Skains. Plan R ICA stent at the end of IP rehab prior to return home  BP goal < 180  Respiratory failure  S/P intubation for IR procedure - Extubated 02/17/2017  CXR much improved  Diabetes, new diagnosis  HgbA1c 7.0 goal < 7.0  Uncontrolled  hyperglycemia  CBG monitoring  SSI  Hypertension  Home meds:   Amlodipine and cozaar  BP goal < 180  Stable   s/p albumin 4 doses  Hyperlipidemia  Home meds:  crestor 40  LDL 78, goal < 70  Resumed crestor   Continue statin at discharge  Other Stroke Risk Factors  Advanced age  UDS/ETOH not performed   Obesity, Body mass index is 31.54 kg/m.   Other Active Problems  Elevated BUN -> resolved  Mild anemia - 12.2 / 35.9  Hospital day # 4  Jessicia Napolitano  [facility] See Amion for Pager information 02/20/2017 3:37 PM  I have personally examined this patient, reviewed notes, independently viewed imaging studies, participated in medical decision making and plan of care.ROS completed by me personally and pertinent positives fully documented  I have made any additions or clarifications directly to the above note. Agree with note above. I had a long discussion with the patient's wife regarding his left carotid occlusion high-grade right carotid stenosis and significant stroke risk and plan for elective right carotid revascularization with carotid angioplasty and stenting and answered questions. Recommend aspirin and Plavix for now and transfer  to inpatient rehabilitation and towards the end of rehabilitation stay return for elective right carotid angioplasty stenting by Dr. Corliss Skains. Wife is in agreement with plan. They seem upset with his insurance having lapsed in error and are trying to fix that.. Greater than 50% time during this 25 minute visit was  spent on counseling and coordination of care about his carotid occlusion,, carotid stenosis, discussion about carotid revascularization and answering questions  Delia Heady, MD Medical Director [facility] Pager: [phone] 02/20/2017 3:37 PM  To contact Stroke Continuity provider, please refer to [email or web address]. After hours, contact General Neurology

## 2017-02-20 NOTE — Progress Notes (Signed)
Spoke with patient and family in great detail again about the [employer] study. The patient does not want to participate in research at this time. Patients family states that she has a lot going on right now with insurance, his illness rehab and his outcomes. She is feeling very overwhelmed and did not want to add to his issues. I thanked both patient and family for allowing me to talk with them about research.

## 2017-02-20 NOTE — Progress Notes (Addendum)
Physical Therapy Treatment Patient Details Name: Erik [name] MRN: [id number] DOB: 1956/03/27 Today's Date: 02/20/2017    History of Present Illness 61 y.o. 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  Bil high grade ICA stenosis.  Pt with significant PMhx of HLD, bil knee surgery, and R RTC repair.    PT Comments    Improving steadily.  Emphasis on standing balance and gait sequencing and stability.  Will be a good rehab candidate.   Follow Up Recommendations  CIR     Equipment Recommendations  Rolling walker with 5" wheels;3in1 (PT);Wheelchair (measurements PT);Wheelchair cushion (measurements PT)    Recommendations for Other Services Rehab consult     Precautions / Restrictions Precautions Precautions: Fall Precaution Comments: due to right sided weakness    Mobility  Bed Mobility               General bed mobility comments: up in the recliner  Transfers Overall transfer level: Needs assistance   Transfers: Sit to/from Stand Sit to Stand: Min assist Stand pivot transfers: Min assist       General transfer comment: cues for hand placement and adequate positioning for standing  Ambulation/Gait Ambulation/Gait assistance: Min assist;+2 safety/equipment Ambulation Distance (Feet): 100 Feet (x2) Assistive device: Rolling walker (2 wheeled);1 person hand held assist Gait Pattern/deviations: Step-through pattern;Decreased step length - right;Decreased step length - left;Decreased stance time - right;Decreased stride length Gait velocity: decreased Gait velocity interpretation: Below normal speed for age/gender     Stairs            Wheelchair Mobility    Modified Rankin (Stroke Patients Only) Modified Rankin (Stroke Patients Only) Pre-Morbid Rankin Score: No symptoms Modified Rankin: Moderately severe disability     Balance Overall balance assessment: Needs assistance Sitting-balance support: No upper  extremity supported Sitting balance-Leahy Scale: Good     Standing balance support: No upper extremity supported Standing balance-Leahy Scale: Fair Standing balance comment: statically can maintain balance, dynamically can lose balance.                 Standardized Balance Assessment Standardized Balance Assessment : Berg Balance Test Berg Balance Test Sit to Stand: Able to stand  independently using hands Standing Unsupported: Able to stand 30 seconds unsupported Sitting with Back Unsupported but Feet Supported on Floor or Stool: Able to sit 30 seconds Stand to Sit: Controls descent by using hands Transfers: Able to transfer with verbal cueing and /or supervision From Standing Position, Pick up Object from Floor: Unable to pick up shoe, but reaches 2-5 cm (1-2") from shoe and balances independently Turn 360 Degrees: Needs close supervision or verbal cueing Standing Unsupported, One Foot in Front: Needs help to step but can hold 15 seconds        Cognition Arousal/Alertness: Awake/alert Behavior During Therapy: Flat affect Overall Cognitive Status: Impaired/Different from baseline                         Following Commands: Follows one step commands consistently     Problem Solving: Slow processing General Comments: increased time needed to answer some questions, with pt's first response not always accurate. does look to his spouse at times for assist with answers      Exercises      General Comments        Pertinent Vitals/Pain Pain Assessment: No/denies pain    Home Living  Prior Function            PT Goals (current goals can now be found in the care plan section) Acute Rehab PT Goals Patient Stated Goal: to be able to walk again PT Goal Formulation: With patient/family Time For Goal Achievement: 03/03/17 Potential to Achieve Goals: Good Progress towards PT goals: Progressing toward goals    Frequency     Min 3X/week      PT Plan Current plan remains appropriate    Co-evaluation             End of Session Equipment Utilized During Treatment: Gait belt Activity Tolerance: Patient tolerated treatment well Patient left: in chair;with call bell/phone within reach;with family/visitor present Nurse Communication: Mobility status PT Visit Diagnosis: Hemiplegia and hemiparesis;Difficulty in walking, not elsewhere classified (R26.2) Hemiplegia - Right/Left: Right Hemiplegia - dominant/non-dominant: Dominant Hemiplegia - caused by: Cerebral infarction     Time: 1436-1500 PT Time Calculation (min) (ACUTE ONLY): 24 min  Charges:  $Gait Training: 8-22 mins $Therapeutic Activity: 8-22 mins                    G Codes:       03-03-2017  Inger Bing, PT [phone]  (pager)   Eliseo Gum Amine Adelson 03/03/17, 5:22 PM

## 2017-02-21 LAB — MAGNESIUM: Magnesium: 2 mg/dL (ref 1.7–2.4)

## 2017-02-21 LAB — CBC
HEMATOCRIT: 35.7 % — AB (ref 39.0–52.0)
HEMOGLOBIN: 12.4 g/dL — AB (ref 13.0–17.0)
MCH: 31.2 pg (ref 26.0–34.0)
MCHC: 34.7 g/dL (ref 30.0–36.0)
MCV: 89.7 fL (ref 78.0–100.0)
PLATELETS: 195 10*3/uL (ref 150–400)
RBC: 3.98 MIL/uL — AB (ref 4.22–5.81)
RDW: 12.8 % (ref 11.5–15.5)
WBC: 5.9 10*3/uL (ref 4.0–10.5)

## 2017-02-21 LAB — GLUCOSE, CAPILLARY
GLUCOSE-CAPILLARY: 164 mg/dL — AB (ref 65–99)
Glucose-Capillary: 117 mg/dL — ABNORMAL HIGH (ref 65–99)
Glucose-Capillary: 109 mg/dL — ABNORMAL HIGH (ref 65–99)
Glucose-Capillary: 112 mg/dL — ABNORMAL HIGH (ref 65–99)

## 2017-02-21 LAB — PHOSPHORUS: Phosphorus: 4.5 mg/dL (ref 2.5–4.6)

## 2017-02-21 NOTE — Progress Notes (Signed)
Rehab admissions - [employer] and [employer] case Production designer, theatre/television/film continue to work on issues related to the policy and effective dates.  This has been escalated to the supervisory level.  I will update all once I hear back from insurance carrier.  Call me for questions.  #[phone]

## 2017-02-21 NOTE — Progress Notes (Signed)
Referring Physician(s): Dr. Ritta Slot  Supervising Physician: Julieanne Cotton  Patient Status:  [facility] - In-pt  Chief Complaint:  Left ICA CVA  Subjective: Resting comfortably.  Wife at bedside.  Allergies: Celecoxib and Shellfish-derived products  Medications: Prior to Admission medications   Medication Sig Start Date End Date Taking? Authorizing Provider  amLODipine (NORVASC) 10 MG tablet Take 10 mg by mouth daily. 03/24/15  Yes Historical Provider, MD  CRESTOR 40 MG tablet TAKE 1 TABLET (40 MG TOTAL) BY MOUTH NIGHTLY. 03/24/15  Yes Historical Provider, MD  FLUoxetine (PROZAC) 20 MG capsule Take 20 mg by mouth daily. 01/25/17  Yes Historical Provider, MD  hydrochlorothiazide (HYDRODIURIL) 25 MG tablet TAKE 1 TABLET (25 MG TOTAL) BY MOUTH ONCE DAILY. 03/24/15  Yes Historical Provider, MD  losartan (COZAAR) 25 MG tablet TAKE 1 TABLET (25 MG TOTAL) BY MOUTH ONCE DAILY. 03/27/15  Yes Historical Provider, MD  Multiple Vitamin (MULTIVITAMIN) tablet Take 1 tablet by mouth daily.   Yes Historical Provider, MD  omeprazole (PRILOSEC) 40 MG capsule TAKE 1 CAPSULE (40 MG TOTAL) BY MOUTH ONCE DAILY. 03/24/15  Yes Historical Provider, MD  phentermine (ADIPEX-P) 37.5 MG tablet Take 37.5 mg by mouth every morning. 01/20/17  Yes Historical Provider, MD  tadalafil (CIALIS) 5 MG tablet Take 5 mg by mouth daily as needed for erectile dysfunction.   Yes Historical Provider, MD  tamsulosin (FLOMAX) 0.4 MG CAPS capsule Take 0.4 mg by mouth daily. 02/12/17  Yes Historical Provider, MD     Vital Signs: BP (!) 116/58 (BP Location: Left Arm)   Pulse 66   Temp 98.4 F (36.9 C) (Oral)   Resp 20   Ht  (1.778 m)   Wt 219 lb 5.7 oz (99.5 kg)   SpO2 98%   BMI 31.47 kg/m   Physical Exam  Constitutional: He is oriented to person, place, and time.  Cardiovascular: Normal rate and regular rhythm.   Pulmonary/Chest: Effort normal. No respiratory distress.  Neurological: He is alert and  oriented to person, place, and time.  Facial droop, right sided weakness  Skin: Skin is warm and dry.  Nursing note and vitals reviewed.   Imaging: No results found.  Labs:  CBC:  Recent Labs  02/18/17 0223 02/19/17 0428 02/20/17 0547 02/21/17 0558  WBC 9.3 7.0 6.3 5.9  HGB 11.4* 12.2* 12.1* 12.4*  HCT 33.7* 35.9* 34.9* 35.7*  PLT 154 151 170 195    COAGS:  Recent Labs  02/16/17 0038  INR 0.97  APTT 22*    BMP:  Recent Labs  02/16/17 0038 02/16/17 0050 02/17/17 0233 02/18/17 0223 02/19/17 0428  NA 136 138 140 139 139  K 3.7 3.6 3.6 3.5 3.9  CL 102 101 105 104 102  CO2 23  --  GLUCOSE 206* 207* 107* 120* 112*  BUN 22* 24* CALCIUM 9.3  --  8.9 9.5 9.2  CREATININE 0.97 1.00 0.71 0.73 0.76  GFRNONAA >60  --  >60 >60 >60  GFRAA >60  --  >60 >60 >60    LIVER FUNCTION TESTS:  Recent Labs  02/16/17 0038  BILITOT 0.6  AST 30  ALT 29  ALKPHOS 33*  PROT 6.9  ALBUMIN 4.4    Assessment and Plan: Left ICA CVA 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  Patient is planning to d/c  to CIR and is currently working with insurance to arrange.  Plan is for IR intervention of the R carotid with possible stent placement with Dr. Corliss Skains at the end of his rehabilitation stay.   Continue Plavix and aspirin as ordered.  Patient and wife are aware of care plan.  All questions are answered.  They are aware that schedulers will continue to work on date and timing.   Electronically Signed: Hoyt Koch 02/21/2017, 1:02 PM   I spent a total of 15 Minutes at the the patient's bedside AND on the patient's hospital floor or unit, greater than 50% of which was counseling/coordinating care for R carotid stenosis.

## 2017-02-21 NOTE — Progress Notes (Signed)
Occupational Therapy Treatment Patient Details Name: Erik [name] MRN: [id number] DOB: 12/13/1955 Today's Date: 02/21/2017    History of present illness 61 y.o. 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  Bil high grade ICA stenosis.  Pt with significant PMhx of HLD, bil knee surgery, and R RTC repair.   OT comments  Pt making excellent progress. Pt educated on role of OT regarding recovery post CVA. Session focused on ADL retraining with facilitating functonal use of RUE, postural control, dynamic balance, problem solving and motor planning.  Pt very motivated to and participate with OT and return to PLOF . Continue to recommend CIR to maximize functional level of independence to facilitate safe DC home @ modified independent level.  Follow Up Recommendations  CIR    Equipment Recommendations  Other (comment) (TBA at next venue)    Recommendations for Other Services Rehab consult    Precautions / Restrictions Precautions Precautions: Fall       Mobility Bed Mobility               General bed mobility comments: OOB in chair  Transfers Overall transfer level: Needs assistance Equipment used: 1 person hand held assist Transfers: Sit to/from Stand;Stand Pivot Transfers Sit to Stand: Min guard Stand pivot transfers: Min assist (greater difficulty when turning toward R affected side) - Required physical cues to correct posture            Balance Overall balance assessment: Needs assistance   Sitting balance-Leahy Scale: Good       Standing balance-Leahy Scale: Fair Standing balance comment: Able to maintain midline postural control during dynamic tasks with min A - Able to retrieve item from floor with min A                           ADL either performed or assessed with clinical judgement   ADL Overall ADL's : Needs assistance/impaired     Grooming: Set up;Supervision/safety;Oral care;Wash/dry  face;Standing   Upper Body Bathing: Set up;Supervision/ safety;Standing   Lower Body Bathing: Minimal assistance;Sit to/from stand                       Functional mobility during ADLs: Minimal assistance;Cueing for safety Pt ambulated @ room with min A with facilitation to weight shift through RLE using tactile cues under R axilla and posterior aspect L hip. General ADL Comments: Able to use B hands to bath peri/groin area. Used guiding techniques to assist with functional use of RUE during LB bathing/dressing. Pt spontaneously using RUE during tasks. Moderate difficulty with in-hand manipulation skills during ADL Increased time for processing noted during ADL tasks  When engaged in ADL tasks standing at sink, noted motor impersistence RLE. With tactile cues, pt able to facilitate and maintain control of RLE. Able to extinguish tactile cues as session progressed and pt increased awareness of maintaining control of RLE.  .      Vision   Additional Comments: will further assess visual fields; demonstrating difficulty at times locating objects in R visual field   Perception  Note difficulty reading however, pt using objects appropriately (wife also noted difficulty with reading)   Praxis     Cognition Arousal/Alertness: Awake/alert Behavior During Therapy: WFL for tasks assessed/performed Overall Cognitive Status: Impaired/Different from baseline Area of Impairment: Attention;Awareness;Safety/judgement;Problem solving                     Current Attention Level: Selective     Safety/Judgement: Decreased awareness of safety Awareness: Emergent Problem Solving: Slow processing General Comments: Demonstrating improved ability to problem solve during functional tasks        Exercises Other Exercises Other Exercises:  - scapular strengthening -Scapular elevation/depression x 10 with emphasis on holding in depression; scapular protraction/retraction and depression x 10  with emphasis in holding in depressino/retraction. Other Exercises: RUE strengthening using PNF D1 diagonal patterns x 10 - pt able to return demonstrate Other Exercises: sit - stand through midline while weight bearing through RLE                Pertinent Vitals/ Pain       Pain Assessment: No/denies pain  Home Living                                          Prior Functioning/Environment              Frequency  Min 3X/week        Progress Toward Goals  OT Goals(current goals can now be found in the care plan section)  Progress towards OT goals: Progressing toward goals (met grooming goal)  Acute Rehab OT Goals Patient Stated Goal: to be able to walk again OT Goal Formulation: With patient/family Time For Goal Achievement: 03/04/17 Potential to Achieve Goals: Good ADL Goals Pt Will Perform Grooming: with min guard assist;standing - met Pt Will Perform Lower Body Bathing: with min guard assist;sit to/from stand Pt Will Perform Lower Body Dressing: with min guard assist;sit to/from stand Pt Will Transfer to Toilet: with min guard assist;ambulating;bedside commode Pt Will Perform Toileting - Clothing Manipulation and hygiene: with min guard assist;sit to/from stand Pt/caregiver will Perform Home Exercise Program: Increased ROM;Increased strength;Right Upper extremity;With Supervision;With written HEP provided Additional ADL Goal #1: Pt will be S with working on visual tracking activities in non-table to activities.  Plan Discharge plan remains appropriate    Co-evaluation                 End of Session Equipment Utilized During Treatment: Gait belt  OT Visit Diagnosis: Unsteadiness on feet (R26.81);Muscle weakness (generalized) (M62.81);Cognitive communication deficit (R41.841);Hemiplegia and hemiparesis Symptoms and signs involving cognitive functions: Cerebral infarction Hemiplegia - Right/Left: Right Hemiplegia - dominant/non-dominant:  Dominant Hemiplegia - caused by: Cerebral infarction   Activity Tolerance Patient tolerated treatment well   Patient Left in chair;with call bell/phone within reach   Nurse Communication Mobility status        Time: 0762-2633 OT Time Calculation (min): 37 min  Charges: OT General Charges $OT Visit: 1 Procedure OT Treatments $Self Care/Home Management : 8-22 mins $Neuromuscular Re-education: 8-22 mins  [facility], OT/L  [phone] 02/21/2017   Meriel Kelliher,HILLARY 02/21/2017, 1:40 PM

## 2017-02-21 NOTE — Progress Notes (Addendum)
CSW following for SNF admission if CIR is note approved- family prefers [facility] in Community education officer with [facility]- they will start SNF auth- they will not accept LOG for a [employer] patient.  Only other bed offer is [facility]- CSW called to ensure they are contracted with [employer] and inquire about LOG placement- left message  Burna Sis, LCSW Clinical Social Worker [phone]

## 2017-02-21 NOTE — Progress Notes (Signed)
STROKE TEAM PROGRESS NOTE   SUBJECTIVE (INTERVAL HISTORY) Wife at bedside. Insurance approval for CIR underway. Pt medically ready for transfer.    OBJECTIVE Temp:  [98 F (36.7 C)-98.5 F (36.9 C)] 98.5 F (36.9 C) (04/18 0740) Pulse Rate:  [60-68] 60 (04/18 0740) Cardiac Rhythm: Sinus bradycardia;Heart block (04/18 0700) Resp:  [20] 20 (04/18 0457) BP: (118-134)/(57-66) 118/58 (04/18 0740) SpO2:  [96 %-100 %] 100 % (04/18 0740) Weight:  [99.5 kg (219 lb 5.7 oz)] 99.5 kg (219 lb 5.7 oz) (04/18 0457)   Recent Labs Lab 02/20/17 1131 02/20/17 1626 02/20/17 2131 02/21/17 0623 02/21/17 1102  GLUCAP 164* 134* 109* 117* 164*    Recent Labs Lab 02/16/17 0038 02/16/17 0050 02/17/17 0233 02/18/17 0223 02/19/17 0428 02/20/17 0547 02/21/17 0558  NA 136 138 140 139 139  --   --   K 3.7 3.6 3.6 3.5 3.9  --   --   CL 102 101 105 104 102  --   --   CO2 23  --  --   --   GLUCOSE 206* 207* 107* 120* 112*  --   --   BUN 22* 24* --   --   CREATININE 0.97 1.00 0.71 0.73 0.76  --   --   CALCIUM 9.3  --  8.9 9.5 9.2  --   --   MG  --   --  2.0 2.0 2.1 2.0 2.0  PHOS  --   --  3.2 3.2 3.4 4.4 4.5    Recent Labs Lab 02/16/17 0038  AST 30  ALT 29  ALKPHOS 33*  BILITOT 0.6  PROT 6.9  ALBUMIN 4.4    Recent Labs Lab 02/16/17 0038  02/17/17 0233 02/18/17 0223 02/19/17 0428 02/20/17 0547 02/21/17 0558  WBC 6.6  --  7.0 9.3 7.0 6.3 5.9  NEUTROABS 3.3  --   --   --   --   --   --   HGB 13.2  < > 10.5* 11.4* 12.2* 12.1* 12.4*  HCT 38.2*  < > 32.0* 33.7* 35.9* 34.9* 35.7*  MCV 91.0  --  92.2 90.3 90.0 89.0 89.7  PLT 207  --  140* 154 151 170 195  < > = values in this interval not displayed.   PHYSICAL EXAM General - Well nourished, well developed,  Caucasian obese male not in distress. Ophthalmologic - fundi not visualized  Cardiovascular - Regular rate and rhythm.  Neuro - Awake alert mild expressive aphasia with hesitant speech but good  comprehension and naming.PERRL, EOMI, blinking to visual threat on the left but not on the right. Facial symmetry difficult to test due to ET. LUE and LLE purposeful movement and against gravity, RUE proximal 3-/5, finger movement 0/5, RLE 3/5 proximal and 2/5 distally. DTR 1+ and no babinski bilaterally. Sensation, coordination not cooperative. Gait not tested.    ASSESSMENT/PLAN Erik [name] is a 61 y.o. male with history of HTN, HLD admitted for aphasia and right side weakness. Received IV tPA and endovascular intervention.    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 Failed attempt left ICA stenting. Infarct secondary to large vessel disease source given bilateral ICA high grade stenosis with severely calcified plaques  Code stroke CT hyperdense L MCA. Aspects 10.  CTA head and neck showed left ICA and left M1 occlusion, right ICA proximal at least  90% stenosis  Cerebral angio tandem L ICA and MCA occlusion with angioplasty and mechanical throbectomy. TICI 3. (solitaire 1 pass). High grade R ICA stenosis 73% with string sign. Attempted stent placement  MRI left MCA patchy infarcts with small hemorrhagic transformation  MRA left ICA not visualized but reconstituted at cavernous portion, left MCA patent  CUS - There is 80-99% right ICA stenosis.  The left ICA appears occluded.  2D Echo  EF 55-60%, no SOE. Grade 2 DD  LDL 78  HgbA1c 7.0  Heparin subq for VTE prophylaxis  Diet Carb Modified Fluid consistency: Thin; Room service appropriate? Yes   No antithrombotic prior to admission, now on ASA and plavix given large vessel disease. Plan x 3 months then plavix alone. May be altered based on potential stent placement  Ongoing aggressive stroke risk factor management  Therapy recommendations:  CIR. medically ready for discharge, await bed. Insurance approval underway  Disposition:  Pending  b/l  ICA stenosis  Left ICA occlusion on CTA but now likely high grade stenosis post intervention  MRA no visualization of left ICA but reconstituted at cavernous portion  CUS - left ICA appears occluded.  Failed left ICA stenting  Dr. Pearlean Brownie discussed with Dr. Corliss Skains. Plan R ICA stent at the end of IP rehab prior to return home  BP goal < 180  Respiratory failure  S/P intubation for IR procedure - Extubated 02/17/2017  CXR much improved  Diabetes, new diagnosis  HgbA1c 7.0 goal < 7.0  Glu 109-164  Hypertension  Home meds:   Amlodipine and cozaar  BP goal < 180  Stable   s/p albumin 4 doses  Hyperlipidemia  Home meds:  crestor 40  LDL 78, goal < 70  Resumed crestor   Continue statin at discharge  Other Stroke Risk Factors  Advanced age  UDS/ETOH not performed   Obesity, Body mass index is 31.47 kg/m.   Other Active Problems  Elevated BUN -> resolved  Mild anemia - 12.2 / 35.9  Hospital day # 5  Erik [name] [facility] See Amion for Pager information 02/21/2017 11:14 AM  D/w Rehab coordinator and patient`s wife. Medically stable for transfer to rehabilitation when bed available Erik Heady, MD Medical Director [facility] Pager: [phone] 02/21/2017 3:22 PM To contact Stroke Continuity provider, please refer to [email or web address]. After hours, contact General Neurology

## 2017-02-21 NOTE — Progress Notes (Signed)
Physical Therapy Treatment Patient Details Name: Erik [name] MRN: [id number] DOB: 01/13/1956 Today's Date: 02/21/2017    History of Present Illness 61 y.o. 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  Bil high grade ICA stenosis.  Pt with significant PMhx of HLD, bil knee surgery, and R RTC repair.    PT Comments    Work on static and dynamic tasks that coincide with BERG and DGI with min to min guard assist, worsening with fatigue.  Worked on gait stability, sequencing/pattern and stamina.   Follow Up Recommendations  CIR     Equipment Recommendations  Other (comment) (TBA)    Recommendations for Other Services Rehab consult     Precautions / Restrictions Precautions Precautions: Fall Restrictions Weight Bearing Restrictions: No    Mobility  Bed Mobility Overal bed mobility: Needs Assistance             General bed mobility comments: OOB in chair  Transfers Overall transfer level: Needs assistance Equipment used: 1 person hand held assist Transfers: Sit to/from Stand;Stand Pivot Transfers Sit to Stand: Min guard Stand pivot transfers: Min assist       General transfer comment: cues for hand placement and adequate positioning for standing  Ambulation/Gait Ambulation/Gait assistance: Min assist Ambulation Distance (Feet): 100 Feet (x2) Assistive device: 1 person hand held assist (rail) Gait Pattern/deviations: Step-through pattern Gait velocity: decreased Gait velocity interpretation: Below normal speed for age/gender General Gait Details: working on heel/toe and dissociation of trunk with assist for w/shift control and trunk control when pt has no rail to use.   Stairs            Wheelchair Mobility    Modified Rankin (Stroke Patients Only) Modified Rankin (Stroke Patients Only) Pre-Morbid Rankin Score: No symptoms Modified Rankin: Moderately severe disability     Balance Overall balance  assessment: Needs assistance Sitting-balance support: No upper extremity supported Sitting balance-Leahy Scale: Good     Standing balance support: No upper extremity supported Standing balance-Leahy Scale: Fair Standing balance comment: Able to maintain midlien postural control                            Cognition Arousal/Alertness: Awake/alert Behavior During Therapy: WFL for tasks assessed/performed Overall Cognitive Status: Impaired/Different from baseline Area of Impairment: Attention;Awareness;Safety/judgement;Problem solving                   Current Attention Level: Selective   Following Commands: Follows one step commands consistently Safety/Judgement: Decreased awareness of safety Awareness: Emergent Problem Solving: Slow processing General Comments: Demonstrating improved ability to problem solve during functional tasks      Exercises Other Exercises Other Exercises: Scapular elevation/depression x 10 with emphasis on holding in depression (Simultaneous filing. User may not have seen previous data.) Other Exercises: RUE strengthening using PNF D1 diagonal patterns x 10 - pt able to return demonstrate Other Exercises: sit - stand through midline while weight bearing through RLE    General Comments General comments (skin integrity, edema, etc.): worked on many of the tasks of the [employer] with emphasis on controlled stepping in all directions with more assist needed for backing up and turning 360*, but with notable improvements from yesterday with 360.  Added in reach task in about 270* arc including reach low and high with minimal stability assist and correction for LOB      Pertinent Vitals/Pain Pain Assessment: No/denies pain  Home Living                      Prior Function            PT Goals (current goals can now be found in the care plan section) Acute Rehab PT Goals Patient Stated Goal: to be able to walk again PT Goal  Formulation: With patient/family Time For Goal Achievement: 03/03/17 Potential to Achieve Goals: Good Progress towards PT goals: Progressing toward goals    Frequency    Min 3X/week      PT Plan Current plan remains appropriate    Co-evaluation             End of Session   Activity Tolerance: Patient tolerated treatment well Patient left: in chair;with call bell/phone within reach;with family/visitor present Nurse Communication: Mobility status PT Visit Diagnosis: Hemiplegia and hemiparesis;Difficulty in walking, not elsewhere classified (R26.2) Hemiplegia - Right/Left: Right Hemiplegia - dominant/non-dominant: Dominant Hemiplegia - caused by: Cerebral infarction     Time: 1315-1335 PT Time Calculation (min) (ACUTE ONLY): 20 min  Charges:  $Therapeutic Activity: 8-22 mins                    G Codes:       Mar 18, 2017  [place], PT [phone]  (pager)   Eliseo Gum Trek Kimball 03-18-17, 1:44 PM

## 2017-02-21 NOTE — PMR Pre-admission (Signed)
PMR Admission Coordinator Pre-Admission Assessment  Patient: Erik [name] is an 61 y.o., male MRN: [id number] DOB: [birth year] Height: _0  (177.8 cm) Weight: 95.7 kg (211 lb 1 oz)             Insurance Information HMO:     PPO:       PCP:       IPA:       80/20: Yes    OTHER: Group # [id number] PRIMARY: [employer]      Policy#: [id number]      Subscriber:  [name] CM Name:                                    Phone#:                              Fax#:   Pre-Cert#: [id number]      Employer:  [employer] receiving for [employer] Benefits:  Phone #: [phone]      Name: Online  Eff. Date: 11/06/16     Deduct:  $3000 (met $213.82)      Out of Pocket Max: $7000 (met $213.82)      Life Max:  N/A CIR: 80% w/auth      SNF: 80% w/auth and 60 days max Outpatient:  805 with 60 visits max     Co-Pay: 20% Home Health: 80% with max 120 visits      Co-Pay: 20% DME: 80%     Co-Pay: 20% Providers: in network  Medicaid Application Date:        Case Manager:   Disability Application Date:        Case Worker:    Emergency Facilities manager Information    Name Relation Home Work [place]  [phone]      Current Medical History  Patient Admitting Diagnosis: L MCA CVA  History of Present Illness: A 61 y.o.malewith history of BPH, OA s/p B-TKR , HTN (wife/patient deny) who was admitted on 02/16/17 after being found by wife lying on the ground with inability to speak. History taken from chart review, patient, and wife. CTA head/neck revealed hyperdense L-MCA sign likely indicating acute thrombus. He received tPA and he underwent cerebral angiogram with L-MCA thrombectomy, dense calcified plaque L-ICA precluded stent placement and noted to have 73% stenosis R-ICA due to advanced atherosclerotic changes. MRI/MRA brain reviewed, showing multiple areas of infarct. Per report, multiple areas of acute or early ischemia within left supratentorial  hemisphere--largest affecting left basal and temporal lobe with cytotoxic edema, petechial hemorrhage in left posterior temporal and peripheral left basal ganglia. Carotid dopplers with right severe mixed plaque origin and proximal ICA/ECA 80-99%, plan for elective surgery post-rehab. 2D echo with EF 55-60% with no wall abnormality and grade 2 diastolic dysfunction. Patient on ASA/Plavix X 3 months followed by plavix alone and right ICA stent at end of rehab stay.   He declined participating in stroke AF study. Patient with resultant facial weakness with mild dysarthria, mild dysphagia, aphasia, delayed processing with slow responses, mild left gaze preference and right sided weakness affecting mobility and ability to carry out ADL tasks. CIR recommended for follow up therpay.    Total: 3=NIH  Past Medical History  Past Medical History:  Diagnosis Date  . Anxiety   . BPH (benign prostatic hyperplasia)   .  ED (erectile dysfunction)   . H/O calcium pyrophosphate deposition disease (CPPD)   . HLD (hyperlipidemia)   . Hypogonadism in male   . Lower urinary tract symptoms (LUTS)   . Nocturia   . Testosterone overdose   . Vitamin D deficiency     Family History  family history includes Dementia in his mother; Diabetes in his brother and mother; Hypertension in his mother.  Prior Rehab/Hospitalizations: No previous rehab.  Has the patient had major surgery during 100 days prior to admission? No  Current Medications   Current Facility-Administered Medications:  .  0.9 %  sodium chloride infusion, , Intravenous, Continuous, David L Rinehuls, PA-C, Last Rate: 50 mL/hr at 02/18/17 1600 .  acetaminophen (TYLENOL) tablet 650 mg, 650 mg, Oral, Q6H PRN, David L Rinehuls, PA-C .  amLODipine (NORVASC) tablet 10 mg, 10 mg, Oral, Daily, Garvin Fila, MD, 10 mg at 02/22/17 1056 .  aspirin EC tablet 325 mg, 325 mg, Oral, Daily, David L Rinehuls, PA-C, 325 mg at 02/22/17 1057 .  clopidogrel  (PLAVIX) tablet 75 mg, 75 mg, Oral, Daily, David L Rinehuls, PA-C, 75 mg at 02/22/17 1057 .  FLUoxetine (PROZAC) capsule 20 mg, 20 mg, Oral, Daily, Garvin Fila, MD, 20 mg at 02/22/17 1057 .  heparin injection 5,000 Units, 5,000 Units, Subcutaneous, Q8H, Early Chars Rinehuls, PA-C, 5,000 Units at 02/22/17 0641 .  hydrochlorothiazide (HYDRODIURIL) tablet 25 mg, 25 mg, Oral, Daily, Garvin Fila, MD, 25 mg at 02/22/17 1056 .  insulin aspart (novoLOG) injection 0-9 Units, 0-9 Units, Subcutaneous, TID WC, David L Rinehuls, PA-C, 1 Units at 02/22/17 1206 .  losartan (COZAAR) tablet 25 mg, 25 mg, Oral, Daily, Garvin Fila, MD, 25 mg at 02/22/17 1057 .  Melatonin TABS 3 mg, 3 mg, Oral, QHS PRN, Garvin Fila, MD, 3 mg at 02/21/17 2245 .  pantoprazole (PROTONIX) EC tablet 40 mg, 40 mg, Oral, QHS, Priscella Mann, RPH, 40 mg at 02/21/17 2136 .  rosuvastatin (CRESTOR) tablet 40 mg, 40 mg, Oral, q1800, David L Rinehuls, PA-C, 40 mg at 02/21/17 1739 .  tamsulosin (FLOMAX) capsule 0.4 mg, 0.4 mg, Oral, Daily, Garvin Fila, MD, 0.4 mg at 02/22/17 1056  Patients Current Diet: Diet Carb Modified Fluid consistency: Thin; Room service appropriate? Yes  Precautions / Restrictions Precautions Precautions: Fall Precaution Comments: due to right sided weakness Restrictions Weight Bearing Restrictions: No   Has the patient had 2 or more falls or a fall with injury in the past year?No.  Patient did suffer 1 fall while trying to get out of bed.  Prior Activity Level Community (5-7x/wk): Went out daily, worked FT in Scientist, research (life sciences) and receiving, was driving.  Home Assistive Devices / Equipment Home Assistive Devices/Equipment: Crutches Home Equipment: None  Prior Device Use: Indicate devices/aids used by the patient prior to current illness, exacerbation or injury? None  Prior Functional Level Prior Function Level of Independence: Independent Comments: drives, works full time in a [place]: Did  the patient need help bathing, dressing, using the toilet or eating?  Independent  Indoor Mobility: Did the patient need assistance with walking from room to room (with or without device)? Independent  Stairs: Did the patient need assistance with internal or external stairs (with or without device)? Independent  Functional Cognition: Did the patient need help planning regular tasks such as shopping or remembering to take medications? Independent  Current Functional Level Cognition  Arousal/Alertness: Awake/alert Overall Cognitive Status: Impaired/Different from baseline Current  Attention Level: Selective Orientation Level: Oriented X4 Following Commands: Follows one step commands consistently Safety/Judgement: Decreased awareness of safety General Comments: Demonstrating improved ability to problem solve during functional tasks Attention: Sustained Sustained Attention: Appears intact Memory: Impaired Memory Impairment: Storage deficit, Retrieval deficit (suspect language impairment impacted this greatly) Awareness: Appears intact Safety/Judgment: Appears intact Comments: mental math - correctly 93/100 - could not correctly answer question;     Extremity Assessment (includes Sensation/Coordination)  Upper Extremity Assessment: RUE deficits/detail RUE Deficits / Details: slower compared to LUE with increased effort to do same task with it as LUE; can oppose digits with increased time as well as finger to nose RUE Sensation: decreased proprioception RUE Coordination: decreased fine motor, decreased gross motor  Lower Extremity Assessment: RLE deficits/detail RLE Deficits / Details: right leg with at least 3/5 ankle PF/DF, 3/5 knee ext, 3/5 hip flexion.  Sensation intact to LT RLE Sensation:  (intact to LT) RLE Coordination: decreased gross motor, decreased fine motor    ADLs  Overall ADL's : Needs assistance/impaired Eating/Feeding: Supervision/ safety, Set up, Sitting Grooming:  Set up, Supervision/safety, Oral care, Wash/dry face, Standing Upper Body Bathing: Set up, Supervision/ safety, Standing Lower Body Bathing: Minimal assistance, Sit to/from stand Upper Body Dressing : Moderate assistance, Sitting Lower Body Dressing: Moderate assistance Lower Body Dressing Details (indicate cue type and reason): Able with increased time to get his socks off and on; as well as his shoes with as shoe horn. Pt paying more attention to how his left hand is functioning today so he is less likely to lose grip Toilet Transfer: Minimal assistance, Stand-pivot Toileting- Clothing Manipulation and Hygiene: Maximal assistance (min A sit<>stand) Functional mobility during ADLs: Minimal assistance, Cueing for safety General ADL Comments: Able to use B hands to bath peri/groin area. Used guiding techniques to assist with functional use of RUE during LB bathing/dressing.    Mobility  Overal bed mobility: Needs Assistance Bed Mobility: Supine to Sit Supine to sit: HOB elevated, Min guard General bed mobility comments: OOB in chair    Transfers  Overall transfer level: Needs assistance Equipment used: 1 person hand held assist Transfers: Sit to/from Stand, Stand Pivot Transfers Sit to Stand: Min guard Stand pivot transfers: Min assist General transfer comment: cues for hand placement and adequate positioning for standing    Ambulation / Gait / Stairs / Wheelchair Mobility  Ambulation/Gait Ambulation/Gait assistance: Museum/gallery curator (Feet): 100 Feet (x2) Assistive device: 1 person hand held assist (rail) Gait Pattern/deviations: Step-through pattern General Gait Details: working on heel/toe and dissociation of trunk with assist for w/shift control and trunk control when pt has no rail to use. Gait velocity: decreased Gait velocity interpretation: Below normal speed for age/gender    Posture / Balance Dynamic Sitting Balance Sitting balance - Comments: leans  posteriorly with crossin his legs to work on donning socks Balance Overall balance assessment: Needs assistance Sitting-balance support: No upper extremity supported Sitting balance-Leahy Scale: Good Sitting balance - Comments: leans posteriorly with crossin his legs to work on donning socks Standing balance support: No upper extremity supported Standing balance-Leahy Scale: Fair Standing balance comment: Able to maintain midlien postural control Standardized Balance Assessment Standardized Balance Assessment : Berg Balance Test Berg Balance Test Sit to Stand: Able to stand  independently using hands Standing Unsupported: Able to stand 30 seconds unsupported Sitting with Back Unsupported but Feet Supported on Floor or Stool: Able to sit 30 seconds Stand to Sit: Controls descent by using hands Transfers: Able to  transfer with verbal cueing and /or supervision From Standing Position, Pick up Object from Floor: Unable to pick up shoe, but reaches 2-5 cm (1-2") from shoe and balances independently Turn 360 Degrees: Needs close supervision or verbal cueing Standing Unsupported, One Foot in Front: Needs help to step but can hold 15 seconds    Special needs/care consideration BiPAP/CPAP No CPM No Continuous Drip IV No  Dialysis No       Life Vest No Oxygen no Special Bed No Trach Size No Wound Vac (area) No    Skin No                           Bowel mgmt: Last BM documented 02/19/17 Bladder mgmt: Voiding in urinal Diabetic mgmt Hgb A1C 7.0 and on insulin prn in acute hospital.    Previous Home Environment Living Arrangements: Spouse/significant other, Children, Other relatives  Lives With: Spouse Available Help at Discharge: Family, Available PRN/intermittently Type of Home: House Home Layout: Two level, Bed/bath upstairs, Full bath on main level Alternate Level Stairs-Rails: Right, Can reach both, Left Alternate Level Stairs-Number of Steps: flight Home Access: Stairs to  enter Entrance Stairs-Rails: Left, Right Entrance Stairs-Number of Steps: 6 Bathroom Shower/Tub: Gaffer, Other (comment), Door Bathroom Toilet: Handicapped height Home Care Services: No  Discharge Living Setting Plans for Discharge Living Setting: Patient's home, House, Lives with (comment) (Lives with wife.) Type of Home at Discharge: House Discharge Home Layout: Two level, Full bath on main level, Bed/bath upstairs Alternate Level Stairs-Number of Steps: 12 steps to bedrooms upstairs Discharge Home Access: Stairs to enter Entrance Stairs-Number of Steps: 5-6 steps Does the patient have any problems obtaining your medications?: No  Social/Family/Support Systems Patient Roles: Spouse, Parent (Has a wife and 2 daughters.  One daughter mental disability.) Contact Information: Owyn Raulston - wife Anticipated Caregiver: wife and brother when wife works Anticipated Ambulance person Information: Abigail Butts - wife - (h) [phone] (c) [phone] Ability/Limitations of Caregiver: Wife works.  Brother retired and can stay with patient while wife works. Caregiver Availability: 24/7 (Wife aware of need for supervision after rehab.) Discharge Plan Discussed with Primary Caregiver: Yes Is Caregiver In Agreement with Plan?: Yes Does Caregiver/Family have Issues with Lodging/Transportation while Pt is in Rehab?: No  Goals/Additional Needs Patient/Family Goal for Rehab: PT/OT supervision, SLP mod I goals Expected length of stay: 12-16 days Cultural Considerations: Attends [employer] Dietary Needs: Carb mod, med cal, thin liquids Equipment Needs: TBD Pt/Family Agrees to Admission and willing to participate: Yes (Spoke with wife and patient.) Program Orientation Provided & Reviewed with Pt/Caregiver Including Roles  & Responsibilities: Yes  Decrease burden of Care through IP rehab admission: N/A  Possible need for SNF placement upon discharge: Not  planned  Patient Condition: This patient's medical and functional status has changed since the consult dated: 02/19/17 in which the Rehabilitation Physician determined and documented that the patient's condition is appropriate for intensive rehabilitative care in an inpatient rehabilitation facility. See "History of Present Illness" (above) for medical update. Functional changes are:  Currently requiring min assist 100 feet +1 HHA. Patient's medical and functional status update has been discussed with the Rehabilitation physician and patient remains appropriate for inpatient rehabilitation. Will admit to inpatient rehab today.  Preadmission Screen Completed By:  [name], 02/22/2017 1:41 PM ______________________________________________________________________   Discussed status with Dr. Letta Pate on 02/22/17 at 1141 and received telephone approval for admission today.  Admission Coordinator:  [name], time 1246/Date 02/22/17

## 2017-02-22 ENCOUNTER — Inpatient Hospital Stay (HOSPITAL_COMMUNITY)
Admission: RE | Admit: 2017-02-22 | Discharge: 2017-03-02 | DRG: 092 | Disposition: A | Discharge status: Home / Self Care | Payer: Managed Care, Other (non HMO) | Source: Intra-hospital | Attending: Physical Medicine & Rehabilitation | Admitting: Physical Medicine & Rehabilitation

## 2017-02-22 ENCOUNTER — Telehealth: Payer: Self-pay

## 2017-02-22 ENCOUNTER — Encounter (HOSPITAL_COMMUNITY): Payer: Self-pay | Admitting: *Deleted

## 2017-02-22 DIAGNOSIS — I69318 Other symptoms and signs involving cognitive functions following cerebral infarction: Secondary | ICD-10-CM | POA: Diagnosis not present

## 2017-02-22 DIAGNOSIS — Z7982 Long term (current) use of aspirin: Secondary | ICD-10-CM | POA: Diagnosis not present

## 2017-02-22 DIAGNOSIS — I6521 Occlusion and stenosis of right carotid artery: Secondary | ICD-10-CM | POA: Diagnosis present

## 2017-02-22 DIAGNOSIS — Z91013 Allergy to seafood: Secondary | ICD-10-CM

## 2017-02-22 DIAGNOSIS — R2689 Other abnormalities of gait and mobility: Principal | ICD-10-CM | POA: Diagnosis present

## 2017-02-22 DIAGNOSIS — J9600 Acute respiratory failure, unspecified whether with hypoxia or hypercapnia: Secondary | ICD-10-CM | POA: Diagnosis not present

## 2017-02-22 DIAGNOSIS — I10 Essential (primary) hypertension: Secondary | ICD-10-CM | POA: Diagnosis not present

## 2017-02-22 DIAGNOSIS — Z794 Long term (current) use of insulin: Secondary | ICD-10-CM

## 2017-02-22 DIAGNOSIS — I69391 Dysphagia following cerebral infarction: Secondary | ICD-10-CM

## 2017-02-22 DIAGNOSIS — I63512 Cerebral infarction due to unspecified occlusion or stenosis of left middle cerebral artery: Secondary | ICD-10-CM | POA: Diagnosis not present

## 2017-02-22 DIAGNOSIS — Z79899 Other long term (current) drug therapy: Secondary | ICD-10-CM

## 2017-02-22 DIAGNOSIS — N400 Enlarged prostate without lower urinary tract symptoms: Secondary | ICD-10-CM | POA: Diagnosis present

## 2017-02-22 DIAGNOSIS — I69351 Hemiplegia and hemiparesis following cerebral infarction affecting right dominant side: Secondary | ICD-10-CM

## 2017-02-22 DIAGNOSIS — Z96653 Presence of artificial knee joint, bilateral: Secondary | ICD-10-CM | POA: Diagnosis present

## 2017-02-22 DIAGNOSIS — G8194 Hemiplegia, unspecified affecting left nondominant side: Secondary | ICD-10-CM

## 2017-02-22 DIAGNOSIS — E785 Hyperlipidemia, unspecified: Secondary | ICD-10-CM | POA: Diagnosis present

## 2017-02-22 DIAGNOSIS — D649 Anemia, unspecified: Secondary | ICD-10-CM | POA: Diagnosis present

## 2017-02-22 DIAGNOSIS — Z888 Allergy status to other drugs, medicaments and biological substances status: Secondary | ICD-10-CM | POA: Diagnosis not present

## 2017-02-22 DIAGNOSIS — Z7983 Long term (current) use of bisphosphonates: Secondary | ICD-10-CM | POA: Diagnosis not present

## 2017-02-22 DIAGNOSIS — Z7902 Long term (current) use of antithrombotics/antiplatelets: Secondary | ICD-10-CM | POA: Diagnosis not present

## 2017-02-22 DIAGNOSIS — I69320 Aphasia following cerebral infarction: Secondary | ICD-10-CM

## 2017-02-22 DIAGNOSIS — R4701 Aphasia: Secondary | ICD-10-CM

## 2017-02-22 DIAGNOSIS — I69322 Dysarthria following cerebral infarction: Secondary | ICD-10-CM

## 2017-02-22 DIAGNOSIS — K219 Gastro-esophageal reflux disease without esophagitis: Secondary | ICD-10-CM | POA: Diagnosis present

## 2017-02-22 DIAGNOSIS — I69392 Facial weakness following cerebral infarction: Secondary | ICD-10-CM

## 2017-02-22 DIAGNOSIS — Z8249 Family history of ischemic heart disease and other diseases of the circulatory system: Secondary | ICD-10-CM

## 2017-02-22 DIAGNOSIS — R1310 Dysphagia, unspecified: Secondary | ICD-10-CM | POA: Diagnosis present

## 2017-02-22 DIAGNOSIS — F419 Anxiety disorder, unspecified: Secondary | ICD-10-CM | POA: Diagnosis present

## 2017-02-22 DIAGNOSIS — E669 Obesity, unspecified: Secondary | ICD-10-CM | POA: Diagnosis present

## 2017-02-22 DIAGNOSIS — I6522 Occlusion and stenosis of left carotid artery: Secondary | ICD-10-CM | POA: Diagnosis not present

## 2017-02-22 DIAGNOSIS — E119 Type 2 diabetes mellitus without complications: Secondary | ICD-10-CM | POA: Diagnosis present

## 2017-02-22 DIAGNOSIS — F809 Developmental disorder of speech and language, unspecified: Secondary | ICD-10-CM | POA: Diagnosis not present

## 2017-02-22 DIAGNOSIS — I6350 Cerebral infarction due to unspecified occlusion or stenosis of unspecified cerebral artery: Secondary | ICD-10-CM

## 2017-02-22 LAB — COMPREHENSIVE METABOLIC PANEL
ALT: 41 U/L (ref 17–63)
ANION GAP: 8 (ref 5–15)
AST: 36 U/L (ref 15–41)
Albumin: 4.2 g/dL (ref 3.5–5.0)
Alkaline Phosphatase: 34 U/L — ABNORMAL LOW (ref 38–126)
BILIRUBIN TOTAL: 0.6 mg/dL (ref 0.3–1.2)
BUN: 25 mg/dL — AB (ref 6–20)
CO2: 28 mmol/L (ref 22–32)
Calcium: 9.9 mg/dL (ref 8.9–10.3)
Chloride: 100 mmol/L — ABNORMAL LOW (ref 101–111)
Creatinine, Ser: 0.88 mg/dL (ref 0.61–1.24)
GFR calc non Af Amer: >60 mL/min (ref >60)
Glucose, Bld: 104 mg/dL — ABNORMAL HIGH (ref 65–99)
POTASSIUM: 3.9 mmol/L (ref 3.5–5.1)
Sodium: 136 mmol/L (ref 135–145)
TOTAL PROTEIN: 7.7 g/dL (ref 6.5–8.1)

## 2017-02-22 LAB — CBC WITH DIFFERENTIAL/PLATELET
Basophils Absolute: 0 10*3/uL (ref 0.0–0.1)
Basophils Relative: 0 %
EOS PCT: 2 %
Eosinophils Absolute: 0.1 10*3/uL (ref 0.0–0.7)
HEMATOCRIT: 38.5 % — AB (ref 39.0–52.0)
Hemoglobin: 13.4 g/dL (ref 13.0–17.0)
LYMPHS PCT: 35 %
Lymphs Abs: 2.2 10*3/uL (ref 0.7–4.0)
MCH: 31.9 pg (ref 26.0–34.0)
MCHC: 34.8 g/dL (ref 30.0–36.0)
MCV: 91.7 fL (ref 78.0–100.0)
MONO ABS: 0.7 10*3/uL (ref 0.1–1.0)
MONOS PCT: 12 %
NEUTROS ABS: 3.1 10*3/uL (ref 1.7–7.7)
Neutrophils Relative %: 51 %
PLATELETS: 206 10*3/uL (ref 150–400)
RBC: 4.2 MIL/uL — ABNORMAL LOW (ref 4.22–5.81)
RDW: 13.1 % (ref 11.5–15.5)
WBC: 6.1 10*3/uL (ref 4.0–10.5)

## 2017-02-22 LAB — GLUCOSE, CAPILLARY
GLUCOSE-CAPILLARY: 129 mg/dL — AB (ref 65–99)
Glucose-Capillary: 146 mg/dL — ABNORMAL HIGH (ref 65–99)
Glucose-Capillary: 114 mg/dL — ABNORMAL HIGH (ref 65–99)
Glucose-Capillary: 151 mg/dL — ABNORMAL HIGH (ref 65–99)

## 2017-02-22 MED ORDER — HEPARIN SODIUM (PORCINE) 5000 UNIT/ML IJ SOLN: 5000.0000 [IU] | Freq: Three times a day (TID) | INTRAMUSCULAR | Status: DC

## 2017-02-22 MED ORDER — BISACODYL 10 MG RE SUPP: 10.0000 mg | Freq: Every day | RECTAL | Status: DC | PRN

## 2017-02-22 MED ORDER — PROCHLORPERAZINE 25 MG RE SUPP: 12.5000 mg | Freq: Four times a day (QID) | RECTAL | Status: DC | PRN

## 2017-02-22 MED ORDER — CLOPIDOGREL BISULFATE 75 MG PO TABS: 75.0000 mg | ORAL_TABLET | Freq: Every day | ORAL | Status: DC

## 2017-02-22 MED ORDER — MELATONIN 3 MG PO TABS
3.0000 mg | ORAL_TABLET | Freq: Every evening | ORAL | Status: DC | PRN
  Administered 2017-02-25: 3 mg via ORAL
  Filled 2017-02-22 (×2): qty 1

## 2017-02-22 MED ORDER — HYDROCHLOROTHIAZIDE 25 MG PO TABS
25.0000 mg | ORAL_TABLET | Freq: Every day | ORAL | Status: DC
  Administered 2017-02-23 – 2017-03-02 (×8): 25 mg via ORAL
  Filled 2017-02-22 (×8): qty 1

## 2017-02-22 MED ORDER — TAMSULOSIN HCL 0.4 MG PO CAPS
0.4000 mg | ORAL_CAPSULE | Freq: Every day | ORAL | Status: DC
  Administered 2017-02-23 – 2017-03-02 (×8): 0.4 mg via ORAL
  Filled 2017-02-22 (×8): qty 1

## 2017-02-22 MED ORDER — ROSUVASTATIN CALCIUM 40 MG PO TABS
40.0000 mg | ORAL_TABLET | Freq: Every day | ORAL | Status: DC
  Administered 2017-02-22 – 2017-03-01 (×8): 40 mg via ORAL
  Filled 2017-02-22 (×8): qty 1

## 2017-02-22 MED ORDER — PROCHLORPERAZINE MALEATE 5 MG PO TABS: 5.0000 mg | ORAL_TABLET | Freq: Four times a day (QID) | ORAL | Status: DC | PRN

## 2017-02-22 MED ORDER — ASPIRIN 325 MG PO TBEC: 325.0000 mg | DELAYED_RELEASE_TABLET | Freq: Every day | ORAL | 0 refills | Status: DC

## 2017-02-22 MED ORDER — AMLODIPINE BESYLATE 10 MG PO TABS
10.0000 mg | ORAL_TABLET | Freq: Every day | ORAL | Status: DC
  Administered 2017-02-23 – 2017-03-02 (×8): 10 mg via ORAL
  Filled 2017-02-22 (×8): qty 1

## 2017-02-22 MED ORDER — CLOPIDOGREL BISULFATE 75 MG PO TABS
75.0000 mg | ORAL_TABLET | Freq: Every day | ORAL | Status: DC
  Administered 2017-02-23 – 2017-03-02 (×8): 75 mg via ORAL
  Filled 2017-02-22 (×8): qty 1

## 2017-02-22 MED ORDER — POLYETHYLENE GLYCOL 3350 17 G PO PACK
17.0000 g | PACK | Freq: Every day | ORAL | Status: DC | PRN
  Filled 2017-02-22: qty 1

## 2017-02-22 MED ORDER — FLEET ENEMA 7-19 GM/118ML RE ENEM: 1.0000 | ENEMA | Freq: Once | RECTAL | Status: DC | PRN

## 2017-02-22 MED ORDER — ASPIRIN EC 325 MG PO TBEC
325.0000 mg | DELAYED_RELEASE_TABLET | Freq: Every day | ORAL | Status: DC
  Administered 2017-02-23 – 2017-03-02 (×8): 325 mg via ORAL
  Filled 2017-02-22 (×8): qty 1

## 2017-02-22 MED ORDER — ENOXAPARIN SODIUM 30 MG/0.3ML ~~LOC~~ SOLN
30.0000 mg | SUBCUTANEOUS | Status: DC
  Administered 2017-02-22 – 2017-03-01 (×8): 30 mg via SUBCUTANEOUS
  Filled 2017-02-22 (×8): qty 0.3

## 2017-02-22 MED ORDER — TRAZODONE HCL 50 MG PO TABS
25.0000 mg | ORAL_TABLET | Freq: Every evening | ORAL | Status: DC | PRN
  Administered 2017-02-22 – 2017-03-01 (×7): 50 mg via ORAL
  Filled 2017-02-22 (×7): qty 1

## 2017-02-22 MED ORDER — LOSARTAN POTASSIUM 50 MG PO TABS
25.0000 mg | ORAL_TABLET | Freq: Every day | ORAL | Status: DC
  Administered 2017-02-23 – 2017-03-02 (×8): 25 mg via ORAL
  Filled 2017-02-22 (×8): qty 1

## 2017-02-22 MED ORDER — INSULIN ASPART 100 UNIT/ML ~~LOC~~ SOLN
0.0000 [IU] | Freq: Three times a day (TID) | SUBCUTANEOUS | Status: DC
  Administered 2017-02-23: 1 [IU] via SUBCUTANEOUS
  Administered 2017-02-23 – 2017-02-24 (×2): 2 [IU] via SUBCUTANEOUS
  Administered 2017-02-24: 1 [IU] via SUBCUTANEOUS
  Administered 2017-02-25: 2 [IU] via SUBCUTANEOUS
  Administered 2017-02-26 – 2017-02-28 (×4): 1 [IU] via SUBCUTANEOUS
  Administered 2017-03-01: 2 [IU] via SUBCUTANEOUS
  Administered 2017-03-02: 1 [IU] via SUBCUTANEOUS

## 2017-02-22 MED ORDER — ALUM & MAG HYDROXIDE-SIMETH 200-200-20 MG/5ML PO SUSP: 30.0000 mL | ORAL | Status: DC | PRN

## 2017-02-22 MED ORDER — FLUOXETINE HCL 20 MG PO CAPS
20.0000 mg | ORAL_CAPSULE | Freq: Every day | ORAL | Status: DC
  Administered 2017-02-23 – 2017-03-02 (×8): 20 mg via ORAL
  Filled 2017-02-22 (×8): qty 1

## 2017-02-22 MED ORDER — PROCHLORPERAZINE EDISYLATE 5 MG/ML IJ SOLN: 5.0000 mg | Freq: Four times a day (QID) | INTRAMUSCULAR | Status: DC | PRN

## 2017-02-22 MED ORDER — PNEUMOCOCCAL VAC POLYVALENT 25 MCG/0.5ML IJ INJ
0.5000 mL | INJECTION | INTRAMUSCULAR | Status: AC
  Administered 2017-02-23: 0.5 mL via INTRAMUSCULAR
  Filled 2017-02-22: qty 0.5

## 2017-02-22 MED ORDER — METFORMIN HCL 500 MG PO TABS
500.0000 mg | ORAL_TABLET | Freq: Every day | ORAL | Status: DC
  Administered 2017-02-23 – 2017-03-02 (×8): 500 mg via ORAL
  Filled 2017-02-22 (×8): qty 1

## 2017-02-22 MED ORDER — GUAIFENESIN-DM 100-10 MG/5ML PO SYRP
5.0000 mL | ORAL_SOLUTION | Freq: Four times a day (QID) | ORAL | Status: DC | PRN
Start: 2017-02-22 — End: 2017-03-02

## 2017-02-22 MED ORDER — INSULIN ASPART 100 UNIT/ML ~~LOC~~ SOLN: 0.0000 [IU] | Freq: Three times a day (TID) | SUBCUTANEOUS | 11 refills | Status: DC

## 2017-02-22 MED ORDER — PANTOPRAZOLE SODIUM 40 MG PO TBEC
40.0000 mg | DELAYED_RELEASE_TABLET | Freq: Every day | ORAL | Status: DC
  Administered 2017-02-22 – 2017-03-01 (×8): 40 mg via ORAL
  Filled 2017-02-22 (×8): qty 1

## 2017-02-22 MED ORDER — ACETAMINOPHEN 325 MG PO TABS: 650.0000 mg | ORAL_TABLET | ORAL | Status: DC | PRN

## 2017-02-22 NOTE — Progress Notes (Signed)
Report given to accepting RN on [phone]

## 2017-02-22 NOTE — Progress Notes (Signed)
Erik Diones, RN Rehab Admission Coordinator Signed Physical Medicine and Rehabilitation  PMR Pre-admission Date of Service: 02/21/2017 12:03 PM  Related encounter: ED to Hosp-Admission (Current) from 02/16/2017 in [place]       _0 Hide copied text PMR Admission Coordinator Pre-Admission Assessment  Patient: Erik [name] is an 61 y.o., male MRN: [id number] DOB: [birth year] Height: _1  (177.8 cm) Weight: 95.7 kg (211 lb 1 oz)                                                                                                                                      Insurance Information HMO:     PPO:       PCP:       IPA:       80/20: Yes    OTHER: Group # [id number] PRIMARY: [employer]      Policy#: [id number]      Subscriber:  [name] CM Name:                                    Phone#:                              Fax#:   Pre-Cert#: [id number]      Employer:  [employer] receiving for [employer] Benefits:  Phone #: [phone]      Name: Online  Eff. Date: 11/06/16     Deduct:  $3000 (met $213.82)      Out of Pocket Max: $7000 (met $213.82)      Life Max:  N/A CIR: 80% w/auth      SNF: 80% w/auth and 60 days max Outpatient:  805 with 60 visits max     Co-Pay: 20% Home Health: 80% with max 120 visits      Co-Pay: 20% DME: 80%     Co-Pay: 20% Providers: in network  Medicaid Application Date:        Case Manager:   Disability Application Date:        Case Worker:    Emergency Tax adviser Information    Name Relation Home Work [place]  [phone]      Current Medical History  Patient Admitting Diagnosis: L MCA CVA  History of Present Illness: A 61 y.o.malewith history of BPH, OA s/p B-TKR , HTN (wife/patient deny) who was admitted on 02/16/17 after being found by wife lying on the ground with inability to speak. History taken from chart review, patient, and wife. CTA head/neck  revealed hyperdense L-MCA sign likely indicating acute thrombus. He received tPA and he underwent cerebral angiogram with L-MCA thrombectomy, dense calcified plaque L-ICA precluded stent placement and noted to have  73% stenosis R-ICA due to advanced atherosclerotic changes. MRI/MRA brain reviewed, showing multiple areas of infarct. Per report, multiple areas of acute or early ischemia within left supratentorial hemisphere--largest affecting left basal and temporal lobe with cytotoxic edema, petechial hemorrhage in left posterior temporal and peripheral left basal ganglia. Carotid dopplers with right severe mixed plaque origin and proximal ICA/ECA 80-99%, plan for elective surgery post-rehab. 2D echo with EF 55-60% with no wall abnormality and grade 2 diastolic dysfunction. Patient on ASA/Plavix X 3 months followed by plavix alone and right ICA stent at end of rehab stay.   He declined participating in stroke AF study. Patient with resultant facial weakness with mild dysarthria, mild dysphagia, aphasia, delayed processing with slow responses, mild left gaze preference and right sided weakness affecting mobility and ability to carry out ADL tasks. CIR recommended for follow up therpay.    Total: 3=NIH  Past Medical History      Past Medical History:  Diagnosis Date  . Anxiety   . BPH (benign prostatic hyperplasia)   . ED (erectile dysfunction)   . H/O calcium pyrophosphate deposition disease (CPPD)   . HLD (hyperlipidemia)   . Hypogonadism in male   . Lower urinary tract symptoms (LUTS)   . Nocturia   . Testosterone overdose   . Vitamin D deficiency     Family History  family history includes Dementia in his mother; Diabetes in his brother and mother; Hypertension in his mother.  Prior Rehab/Hospitalizations: No previous rehab.  Has the patient had major surgery during 100 days prior to admission? No  Current Medications   Current Facility-Administered Medications:    .  0.9 %  sodium chloride infusion, , Intravenous, Continuous, David L Rinehuls, PA-C, Last Rate: 50 mL/hr at 02/18/17 1600 .  acetaminophen (TYLENOL) tablet 650 mg, 650 mg, Oral, Q6H PRN, David L Rinehuls, PA-C .  amLODipine (NORVASC) tablet 10 mg, 10 mg, Oral, Daily, Garvin Fila, MD, 10 mg at 02/22/17 1056 .  aspirin EC tablet 325 mg, 325 mg, Oral, Daily, David L Rinehuls, PA-C, 325 mg at 02/22/17 1057 .  clopidogrel (PLAVIX) tablet 75 mg, 75 mg, Oral, Daily, David L Rinehuls, PA-C, 75 mg at 02/22/17 1057 .  FLUoxetine (PROZAC) capsule 20 mg, 20 mg, Oral, Daily, Garvin Fila, MD, 20 mg at 02/22/17 1057 .  heparin injection 5,000 Units, 5,000 Units, Subcutaneous, Q8H, Early Chars Rinehuls, PA-C, 5,000 Units at 02/22/17 0641 .  hydrochlorothiazide (HYDRODIURIL) tablet 25 mg, 25 mg, Oral, Daily, Garvin Fila, MD, 25 mg at 02/22/17 1056 .  insulin aspart (novoLOG) injection 0-9 Units, 0-9 Units, Subcutaneous, TID WC, David L Rinehuls, PA-C, 1 Units at 02/22/17 1206 .  losartan (COZAAR) tablet 25 mg, 25 mg, Oral, Daily, Garvin Fila, MD, 25 mg at 02/22/17 1057 .  Melatonin TABS 3 mg, 3 mg, Oral, QHS PRN, Garvin Fila, MD, 3 mg at 02/21/17 2245 .  pantoprazole (PROTONIX) EC tablet 40 mg, 40 mg, Oral, QHS, Priscella Mann, RPH, 40 mg at 02/21/17 2136 .  rosuvastatin (CRESTOR) tablet 40 mg, 40 mg, Oral, q1800, David L Rinehuls, PA-C, 40 mg at 02/21/17 1739 .  tamsulosin (FLOMAX) capsule 0.4 mg, 0.4 mg, Oral, Daily, Garvin Fila, MD, 0.4 mg at 02/22/17 1056  Patients Current Diet: Diet Carb Modified Fluid consistency: Thin; Room service appropriate? Yes  Precautions / Restrictions Precautions Precautions: Fall Precaution Comments: due to right sided weakness Restrictions Weight Bearing Restrictions: No   Has the patient had  2 or more falls or a fall with injury in the past year?No.  Patient did suffer 1 fall while trying to get out of bed.  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  Home Assistive Devices / Equipment Home Assistive Devices/Equipment: Crutches Home Equipment: None  Prior Device Use: Indicate devices/aids used by the patient prior to current illness, exacerbation or injury? None  Prior Functional Level Prior Function Level of Independence: Independent Comments: drives, works full time in a [employer]: Did the patient need help bathing, dressing, using the toilet or eating?  Independent  Indoor Mobility: Did the patient need assistance with walking from room to room (with or without device)? Independent  Stairs: Did the patient need assistance with internal or external stairs (with or without device)? Independent  Functional Cognition: Did the patient need help planning regular tasks such as shopping or remembering to take medications? Independent  Current Functional Level Cognition  Arousal/Alertness: Awake/alert Overall Cognitive Status: Impaired/Different from baseline Current Attention Level: Selective Orientation Level: Oriented X4 Following Commands: Follows one step commands consistently Safety/Judgement: Decreased awareness of safety General Comments: Demonstrating improved ability to problem solve during functional tasks Attention: Sustained Sustained Attention: Appears intact Memory: Impaired Memory Impairment: Storage deficit, Retrieval deficit (suspect language impairment impacted this greatly) Awareness: Appears intact Safety/Judgment: Appears intact Comments: mental math - correctly 93/100 - could not correctly answer question;     Extremity Assessment (includes Sensation/Coordination)  Upper Extremity Assessment: RUE deficits/detail RUE Deficits / Details: slower compared to LUE with increased effort to do same task with it as LUE; can oppose digits with increased time as well as finger to nose RUE Sensation: decreased  proprioception RUE Coordination: decreased fine motor, decreased gross motor  Lower Extremity Assessment: RLE deficits/detail RLE Deficits / Details: right leg with at least 3/5 ankle PF/DF, 3/5 knee ext, 3/5 hip flexion.  Sensation intact to LT RLE Sensation:  (intact to LT) RLE Coordination: decreased gross motor, decreased fine motor    ADLs  Overall ADL's : Needs assistance/impaired Eating/Feeding: Supervision/ safety, Set up, Sitting Grooming: Set up, Supervision/safety, Oral care, Wash/dry face, Standing Upper Body Bathing: Set up, Supervision/ safety, Standing Lower Body Bathing: Minimal assistance, Sit to/from stand Upper Body Dressing : Moderate assistance, Sitting Lower Body Dressing: Moderate assistance Lower Body Dressing Details (indicate cue type and reason): Able with increased time to get his socks off and on; as well as his shoes with as shoe horn. Pt paying more attention to how his left hand is functioning today so he is less likely to lose grip Toilet Transfer: Minimal assistance, Stand-pivot Toileting- Clothing Manipulation and Hygiene: Maximal assistance (min A sit<>stand) Functional mobility during ADLs: Minimal assistance, Cueing for safety General ADL Comments: Able to use B hands to bath peri/groin area. Used guiding techniques to assist with functional use of RUE during LB bathing/dressing.    Mobility  Overal bed mobility: Needs Assistance Bed Mobility: Supine to Sit Supine to sit: HOB elevated, Min guard General bed mobility comments: OOB in chair    Transfers  Overall transfer level: Needs assistance Equipment used: 1 person hand held assist Transfers: Sit to/from Stand, Stand Pivot Transfers Sit to Stand: Min guard Stand pivot transfers: Min assist General transfer comment: cues for hand placement and adequate positioning for standing    Ambulation / Gait / Stairs / Wheelchair Mobility  Ambulation/Gait Ambulation/Gait assistance: Min  assist Ambulation Distance (Feet): 100 Feet (x2)  Assistive device: 1 person hand held assist (rail) Gait Pattern/deviations: Step-through pattern General Gait Details: working on heel/toe and dissociation of trunk with assist for w/shift control and trunk control when pt has no rail to use. Gait velocity: decreased Gait velocity interpretation: Below normal speed for age/gender    Posture / Balance Dynamic Sitting Balance Sitting balance - Comments: leans posteriorly with crossin his legs to work on donning socks Balance Overall balance assessment: Needs assistance Sitting-balance support: No upper extremity supported Sitting balance-Leahy Scale: Good Sitting balance - Comments: leans posteriorly with crossin his legs to work on donning socks Standing balance support: No upper extremity supported Standing balance-Leahy Scale: Fair Standing balance comment: Able to maintain midlien postural control Standardized Balance Assessment Standardized Balance Assessment : Berg Balance Test Berg Balance Test Sit to Stand: Able to stand  independently using hands Standing Unsupported: Able to stand 30 seconds unsupported Sitting with Back Unsupported but Feet Supported on Floor or Stool: Able to sit 30 seconds Stand to Sit: Controls descent by using hands Transfers: Able to transfer with verbal cueing and /or supervision From Standing Position, Pick up Object from Floor: Unable to pick up shoe, but reaches 2-5 cm (1-2") from shoe and balances independently Turn 360 Degrees: Needs close supervision or verbal cueing Standing Unsupported, One Foot in Front: Needs help to step but can hold 15 seconds    Special needs/care consideration BiPAP/CPAP No CPM No Continuous Drip IV No  Dialysis No       Life Vest No Oxygen no Special Bed No Trach Size No Wound Vac (area) No    Skin No                           Bowel mgmt: Last BM documented 02/19/17 Bladder mgmt: Voiding in urinal Diabetic mgmt  Hgb A1C 7.0 and on insulin prn in acute hospital.    Previous Home Environment Living Arrangements: Spouse/significant other, Children, Other relatives  Lives With: Spouse Available Help at Discharge: Family, Available PRN/intermittently Type of Home: House Home Layout: Two level, Bed/bath upstairs, Full bath on main level Alternate Level Stairs-Rails: Right, Can reach both, Left Alternate Level Stairs-Number of Steps: flight Home Access: Stairs to enter Entrance Stairs-Rails: Left, Right Entrance Stairs-Number of Steps: 6 Bathroom Shower/Tub: Gaffer, Other (comment), Door Bathroom Toilet: Handicapped height Home Care Services: No  Discharge Living Setting Plans for Discharge Living Setting: Patient's home, House, Lives with (comment) (Lives with wife.) Type of Home at Discharge: House Discharge Home Layout: Two level, Full bath on main level, Bed/bath upstairs Alternate Level Stairs-Number of Steps: 12 steps to bedrooms upstairs Discharge Home Access: Stairs to enter Entrance Stairs-Number of Steps: 5-6 steps Does the patient have any problems obtaining your medications?: No  Social/Family/Support Systems Patient Roles: Spouse, Parent (Has a wife and 2 daughters.  One daughter mental disability.) Contact Information: Dugan Vanhoesen - wife Anticipated Caregiver: wife and brother when wife works Anticipated Ambulance person Information: Abigail Butts - wife - ([phone] (c) [phone] Ability/Limitations of Caregiver: Wife works.  Brother retired and can stay with patient while wife works. Caregiver Availability: 24/7 (Wife aware of need for supervision after rehab.) Discharge Plan Discussed with Primary Caregiver: Yes Is Caregiver In Agreement with Plan?: Yes Does Caregiver/Family have Issues with Lodging/Transportation while Pt is in Rehab?: No  Goals/Additional Needs Patient/Family Goal for Rehab: PT/OT supervision, SLP mod I goals Expected length of stay:  12-16 days Cultural Considerations: Attends [employer] Dietary Needs: Carb mod, med cal, thin liquids Equipment Needs: TBD Pt/Family Agrees to Admission and willing to participate: Yes (Spoke with wife and patient.) Program Orientation Provided & Reviewed with Pt/Caregiver Including Roles  & Responsibilities: Yes  Decrease burden of Care through IP rehab admission: N/A  Possible need for SNF placement upon discharge: Not planned  Patient Condition: This patient's medical and functional status has changed since the consult dated: 02/19/17 in which the Rehabilitation Physician determined and documented that the patient's condition is appropriate for intensive rehabilitative care in an inpatient rehabilitation facility. See "History of Present Illness" (above) for medical update. Functional changes are:  Currently requiring min assist 100 feet +1 HHA. Patient's medical and functional status update has been discussed with the Rehabilitation physician and patient remains appropriate for inpatient rehabilitation. Will admit to inpatient rehab today.  Preadmission Screen Completed By:  Erik [name], 02/22/2017 1:41 PM ______________________________________________________________________   Discussed status with Dr. Letta Pate on 02/22/17 at 1141 and received telephone approval for admission today.  Admission Coordinator:  Erik [name], time 1246/Date 02/22/17       Cosigned by: Charlett Blake, MD at 02/22/2017 2:08 PM  Revision History

## 2017-02-22 NOTE — Telephone Encounter (Signed)
LEft vm for patients wife that the [employer] forms for disability can be filled out at the hospital. Pt is currently admitted as of 02/16/2017. Form was fax to [facility] office.

## 2017-02-22 NOTE — Progress Notes (Signed)
Ankit Karis Juba, MD Physician Addendum Physical Medicine and Rehabilitation  Consult Note Date of Service: 02/19/2017 8:14 AM  Related encounter: ED to Hosp-Admission (Current) from 02/16/2017 in [facility] NEURO MEDICAL     Expand All Collapse All   Hide copied text Hover for attribution information      Physical Medicine and Rehabilitation Consult  Reason for Consult: Right sided weakness, right facial droop and difficulty speaking Referring Physician: Dr. Roda Shutters   HPI: Erik [name] is a 61 y.o. male with history of BPH, OA s/p B-TKR , HTN (wife/patient deny) who was admitted on 02/16/17 after being found by wife lying on the ground with inability to speak. History taken from chart review, patient, and wife. CTA head/neck revealed hyperdense L-MCA sign likely indicating acute thrombus. He received tPA  and he underwent cerebral angiogram with L-MCA thrombectomy, dense calcified plaque L-ICA precluded stent placement and noted to have 73% stenosis R-ICA due to advanced atherosclerotic changes. MRI/MRA brain reviewed, showing multiple areas of infarct. Per report, multiple areas of acute or early ischemia within left supratentorial hemisphere--largest affecting left basal and temporal lobe with cytotoxic edema, petechial hemorrhage in left posterior temporal and peripheral left basal ganglia. Carotid dopplers with right severe mixed plaque origin and proximal ICA/ECA 80-99%, plan for elective surgery post-rehab.  2D echo with EF 55-60% with no wall abnormality and grade 2 diastolic dysfunction.   Patient with resultant facial weakness with mild dysarthria, mild dysphagia, aphasia, delayed processing with slow responses, mild left gaze preference and right sided weakness affecting mobility and ability to carry out ADL tasks. CIR recommended for follow up therpay.    Review of Systems  Constitutional: Negative for chills, fever and malaise/fatigue.  HENT:  Negative for hearing loss and tinnitus.   Eyes: Negative for blurred vision and double vision.  Respiratory: Negative for cough and shortness of breath.   Cardiovascular: Negative for chest pain and palpitations.  Gastrointestinal: Negative for constipation, heartburn and nausea.  Genitourinary: Negative for dysuria, frequency and urgency.       Nocturia-"took medication to slow bladder"  Musculoskeletal: Negative for back pain, myalgias and neck pain.  Neurological: Positive for sensory change, speech change and focal weakness. Negative for dizziness, weakness and headaches.  Psychiatric/Behavioral: The patient is not nervous/anxious and does not have insomnia.   All other systems reviewed and are negative.         Past Medical History:  Diagnosis Date  . Anxiety   . BPH (benign prostatic hyperplasia)   . ED (erectile dysfunction)   . H/O calcium pyrophosphate deposition disease (CPPD)   . HLD (hyperlipidemia)   . Hypogonadism in male   . Lower urinary tract symptoms (LUTS)   . Nocturia   . Testosterone overdose   . Vitamin D deficiency          Past Surgical History:  Procedure Laterality Date  . BACK SURGERY    . IR ANGIO INTRA EXTRACRAN SEL COM CAROTID INNOMINATE UNI R MOD SED  02/16/2017  . IR PERCUTANEOUS ART THROMBECTOMY/INFUSION INTRACRANIAL INC DIAG ANGIO  02/16/2017  . IR PTA NON CORO-LOWER EXTREM  02/16/2017  . IR US GUIDE VASC ACCESS LEFT  02/16/2017  . IR US GUIDE VASC ACCESS RIGHT  02/16/2017  . KNEE ARTHROSCOPY Bilateral   . ROTATOR CUFF REPAIR Right          Family History  Problem Relation Age of Onset  . Diabetes Mother   . Hypertension Mother   .  Dementia Mother   . Diabetes Brother       Social History:  Married. Works for [employer]. He  reports that he has never smoked. He does not have any smokeless tobacco history on file. He reports that he drinks alcohol--one mixed drink at nights and beer occasionally.  His drug history  is not on file.         Allergies  Allergen Reactions  . Celecoxib Nausea And Vomiting  . Shellfish-Derived Products Hives          Medications Prior to Admission  Medication Sig Dispense Refill  . amLODipine (NORVASC) 10 MG tablet Take 10 mg by mouth daily.  11  . CRESTOR 40 MG tablet TAKE 1 TABLET (40 MG TOTAL) BY MOUTH NIGHTLY.  3  . FLUoxetine (PROZAC) 20 MG capsule Take 20 mg by mouth daily.  3  . hydrochlorothiazide (HYDRODIURIL) 25 MG tablet TAKE 1 TABLET (25 MG TOTAL) BY MOUTH ONCE DAILY.  3  . losartan (COZAAR) 25 MG tablet TAKE 1 TABLET (25 MG TOTAL) BY MOUTH ONCE DAILY.  3  . Multiple Vitamin (MULTIVITAMIN) tablet Take 1 tablet by mouth daily.    Marland Kitchen omeprazole (PRILOSEC) 40 MG capsule TAKE 1 CAPSULE (40 MG TOTAL) BY MOUTH ONCE DAILY.  3  . phentermine (ADIPEX-P) 37.5 MG tablet Take 37.5 mg by mouth every morning.  2  . tadalafil (CIALIS) 5 MG tablet Take 5 mg by mouth daily as needed for erectile dysfunction.    . tamsulosin (FLOMAX) 0.4 MG CAPS capsule Take 0.4 mg by mouth daily.  3    Home: Home Living Family/patient expects to be discharged to:: Inpatient rehab Living Arrangements: Spouse/significant other, Children, Other relatives Available Help at Discharge: Family, Available PRN/intermittently Type of Home: House Home Access: Stairs to enter [employer] of Steps: 6 Entrance Stairs-Rails: Left, Right Home Layout: Two level, Bed/bath upstairs, Full bath on main level Alternate Level Stairs-Number of Steps: flight Alternate Level Stairs-Rails: Right, Can reach both, Left Bathroom Shower/Tub: Walk-in shower, Other (comment), Door Bathroom Toilet: Handicapped height Home Equipment: None  Functional History: Prior Function Level of Independence: Independent Comments: drives, works full time in a [employer] Status:  Mobility: Bed Mobility Overal bed mobility: Needs Assistance Bed Mobility: Supine to Sit Supine to sit:  HOB elevated, Min guard General bed mobility comments: moves quickly Transfers Overall transfer level: Needs assistance Equipment used: 2 person hand held assist Transfers: Sit to/from Stand, Stand Pivot Transfers Sit to Stand: Min assist Stand pivot transfers: Min assist General transfer comment: Mod assist to support trunk and stabilize his right leg in standing.  Hyperextended right knee and foot sliding out from under him in standing.  Knee and foot had to be blocked to prevent LOB.  Pt was able to take 1-2 small steps and use his left arm to reach for the far side of the recliner chair on his left side to get up.  He used hip extension to bring right leg around.  Ambulation/Gait General Gait Details: will need two people to be safe at attempts with gait, but I think he will be ready on his next session.   ADL: ADL Overall ADL's : Needs assistance/impaired Eating/Feeding: Supervision/ safety, Set up, Sitting Grooming: Minimal assistance, Sitting Upper Body Bathing: Minimal assistance, Sitting Lower Body Bathing: Moderate assistance (min A sit<>stand) Upper Body Dressing : Moderate assistance, Sitting Lower Body Dressing: Moderate assistance (min A sit<>stand) Toilet Transfer: Minimal assistance, Stand-pivot Toileting- Clothing Manipulation and Hygiene: Maximal assistance (  min A sit<>stand)  Cognition: Cognition Overall Cognitive Status: Impaired/Different from baseline Orientation Level: Oriented to person, Oriented to place, Oriented to situation Cognition Arousal/Alertness: Awake/alert Behavior During Therapy: WFL for tasks assessed/performed Overall Cognitive Status: Impaired/Different from baseline Area of Impairment: Problem solving Following Commands: Follows one step commands with increased time Problem Solving: Slow processing General Comments: Pt is slow to answer questions and at time innacurate with his answers.   Blood pressure (!) 148/69, pulse 63, temperature  98.4 F (36.9 C), temperature source Oral, resp. rate 18, height  (1.778 m), weight 97.7 kg (215 lb 6.4 oz), SpO2 95 %. Physical Exam  Nursing note and vitals reviewed. Constitutional: He is oriented to person, place, and time. He appears well-developed and well-nourished.  HENT:  Head: Normocephalic and atraumatic.  Mouth/Throat: Oropharynx is clear and moist.  Eyes: Conjunctivae and EOM are normal. Pupils are equal, round, and reactive to light.  Neck: Normal range of motion. Neck supple.  Cardiovascular: Normal rate and regular rhythm.   Respiratory: Effort normal and breath sounds normal. No respiratory distress. He has no wheezes.  GI: Soft. Bowel sounds are normal. He exhibits no distension. There is no tenderness.  Musculoskeletal: He exhibits no edema or tenderness.  Neurological: He is alert and oriented to person, place, and time.  Facial weakness with dysarthria.  Able to follow simple one and two step commands.  Motor: RUE/RLE: 5/5 LUE: 4-/5 proximal to distal LLE: 4/5 proximal to distal Sensation intact to light touch DTRs symmetric  Skin: Skin is warm and dry.  Psychiatric: He has a normal mood and affect. His behavior is normal. Thought content normal.    Lab Results Last 24 Hours       Results for orders placed or performed during the hospital encounter of 02/16/17 (from the past 24 hour(s))  Glucose, capillary     Status: Abnormal   Collection Time: 02/18/17 11:42 AM  Result Value Ref Range   Glucose-Capillary 159 (H) 65 - 99 mg/dL   Comment 1 Notify RN    Comment 2 Document in Chart   Glucose, capillary     Status: Abnormal   Collection Time: 02/18/17  4:52 PM  Result Value Ref Range   Glucose-Capillary 114 (H) 65 - 99 mg/dL   Comment 1 Notify RN    Comment 2 Document in Chart   Glucose, capillary     Status: Abnormal   Collection Time: 02/18/17  9:18 PM  Result Value Ref Range   Glucose-Capillary 122 (H) 65 - 99 mg/dL   Comment 1  Notify RN    Comment 2 Document in Chart   Phosphorus     Status: None   Collection Time: 02/19/17  4:28 AM  Result Value Ref Range   Phosphorus 3.4 2.5 - 4.6 mg/dL  Magnesium     Status: None   Collection Time: 02/19/17  4:28 AM  Result Value Ref Range   Magnesium 2.1 1.7 - 2.4 mg/dL  Basic metabolic panel     Status: Abnormal   Collection Time: 02/19/17  4:28 AM  Result Value Ref Range   Sodium 139 135 - 145 mmol/L   Potassium 3.9 3.5 - 5.1 mmol/L   Chloride 102 101 - 111 mmol/L   CO2 26 22 - 32 mmol/L   Glucose, Bld 112 (H) 65 - 99 mg/dL   BUN 15 6 - 20 mg/dL   Creatinine, Ser 1.61 0.61 - 1.24 mg/dL   Calcium 9.2 8.9 - 09.6 mg/dL  GFR calc non Af Amer >60 >60 mL/min   GFR calc Af Amer >60 >60 mL/min   Anion gap 11 5 - 15  CBC     Status: Abnormal   Collection Time: 02/19/17  4:28 AM  Result Value Ref Range   WBC 7.0 4.0 - 10.5 K/uL   RBC 3.99 (L) 4.22 - 5.81 MIL/uL   Hemoglobin 12.2 (L) 13.0 - 17.0 g/dL   HCT 40.9 (L) 81.1 - 91.4 %   MCV 90.0 78.0 - 100.0 fL   MCH 30.6 26.0 - 34.0 pg   MCHC 34.0 30.0 - 36.0 g/dL   RDW 78.2 95.6 - 21.3 %   Platelets 151 150 - 400 K/uL  Glucose, capillary     Status: Abnormal   Collection Time: 02/19/17  6:08 AM  Result Value Ref Range   Glucose-Capillary 120 (H) 65 - 99 mg/dL   Comment 1 Notify RN    Comment 2 Document in Chart      Imaging Results (Last 48 hours)  No results found.    Assessment/Plan: Diagnosis: Left MCA CVA Labs and images independently reviewed.  Records reviewed and summated above. Stroke: Continue secondary stroke prophylaxis and Risk Factor Modification listed below:   Antiplatelet therapy:   Blood Pressure Management:  Continue current medication with prn's with permisive HTN per primary team Statin Agent:   Diabetes management:   Left sided hemiparesis: fit for orthosis to prevent contractures (resting hand splint for day, wrist cock up splint at night, PRAFO,  etc) Motor recovery: Fluoxetine  1. Does the need for close, 24 hr/day medical supervision in concert with the patient's rehab needs make it unreasonable for this patient to be served in a less intensive setting? Yes 2. Co-Morbidities requiring supervision/potential complications: BPH (cont meds), OA s/p B-TKR (Biofeedback training with therapies to help reduce reliance on opiate pain medications, monitor pain control during therapies, and sedation at rest and titrate to maximum efficacy to ensure participation and gains in therapies), HTN (wife/patient deny), right severe mixed plaque (CEA post-rehab), dysphagia (compensatory strategies), ABLA (transfuse if necessary to ensure appropriate perfusion for increased activity tolerance), nocturia (consider meds), newly diagnosed DM (Monitor in accordance with exercise and adjust meds as necessary) 3. Due to bladder management, safety, skin/wound care, disease management and patient education, does the patient require 24 hr/day rehab nursing? Yes 4. Does the patient require coordinated care of a physician, rehab nurse, PT (1-2 hrs/day, 5 days/week), OT (1-2 hrs/day, 5 days/week) and SLP (1-2 hrs/day, 5 days/week) to address physical and functional deficits in the context of the above medical diagnosis(es)? Yes Addressing deficits in the following areas: balance, endurance, locomotion, strength, transferring, bowel/bladder control, bathing, dressing, toileting, speech and psychosocial support 5. Can the patient actively participate in an intensive therapy program of at least 3 hrs of therapy per day at least 5 days per week? Yes 6. The potential for patient to make measurable gains while on inpatient rehab is excellent 7. Anticipated functional outcomes upon discharge from inpatient rehab are supervision  with PT, supervision with OT, modified independent with SLP. 8. Estimated rehab length of stay to reach the above functional goals is: 12-16 days. 9. Does  the patient have adequate social supports and living environment to accommodate these discharge functional goals? Yes 10. Anticipated D/C setting: Home 11. Anticipated post D/C treatments: HH therapy and Home excercise program 12. Overall Rehab/Functional Prognosis: good  RECOMMENDATIONS: This patient's condition is appropriate for continued rehabilitative care in the following setting:  CIR Patient has agreed to participate in recommended program. Yes Note that insurance prior authorization may be required for reimbursement for recommended care.  Comment: Rehab Admissions Coordinator to follow up.  Maryla Morrow, MD, [place] Dom Jacquelynn Cree, [place] 02/19/2017    Revision History              Routing History

## 2017-02-22 NOTE — Progress Notes (Signed)
PT Cancellation Note  Patient Details Name: Erik [name] MRN: [id number] DOB: Apr 13, 1956   Cancelled Treatment:    Reason Eval/Treat Not Completed: Other (comment) (Plans to d/c to CIR today, will cancel treatment and defer therapy to CIR.  )   Roizy Harold Artis Delay 02/22/2017, 3:01 PM Joycelyn Rua, PTA pager [phone]

## 2017-02-22 NOTE — Progress Notes (Signed)
STROKE TEAM PROGRESS NOTE   SUBJECTIVE (INTERVAL HISTORY) Wife at bedside. Insurance approval for CIR underway. Pt medically ready for transfer.    OBJECTIVE Temp:  [98.1 F (36.7 C)-98.8 F (37.1 C)] 98.3 F (36.8 C) (04/19 1300) Pulse Rate:  [62-69] 64 (04/19 1300) Cardiac Rhythm: Normal sinus rhythm (04/19 0700) Resp:  [15-17] 17 (04/19 1300) BP: (109-134)/(60-66) 124/66 (04/19 1300) SpO2:  [93 %-98 %] 97 % (04/19 1300) Weight:  [211 lb 1 oz (95.7 kg)] 211 lb 1 oz (95.7 kg) (04/19 0500)   Recent Labs Lab 02/21/17 1102 02/21/17 1633 02/21/17 2135 02/22/17 0627 02/22/17 1118  GLUCAP 164* 109* 112* 129* 146*    Recent Labs Lab 02/16/17 0038 02/16/17 0050 02/17/17 0233 02/18/17 0223 02/19/17 0428 02/20/17 0547 02/21/17 0558  NA 136 138 140 139 139  --   --   K 3.7 3.6 3.6 3.5 3.9  --   --   CL 102 101 105 104 102  --   --   CO2 23  --  --   --   GLUCOSE 206* 207* 107* 120* 112*  --   --   BUN 22* 24* --   --   CREATININE 0.97 1.00 0.71 0.73 0.76  --   --   CALCIUM 9.3  --  8.9 9.5 9.2  --   --   MG  --   --  2.0 2.0 2.1 2.0 2.0  PHOS  --   --  3.2 3.2 3.4 4.4 4.5    Recent Labs Lab 02/16/17 0038  AST 30  ALT 29  ALKPHOS 33*  BILITOT 0.6  PROT 6.9  ALBUMIN 4.4    Recent Labs Lab 02/16/17 0038  02/17/17 0233 02/18/17 0223 02/19/17 0428 02/20/17 0547 02/21/17 0558  WBC 6.6  --  7.0 9.3 7.0 6.3 5.9  NEUTROABS 3.3  --   --   --   --   --   --   HGB 13.2  < > 10.5* 11.4* 12.2* 12.1* 12.4*  HCT 38.2*  < > 32.0* 33.7* 35.9* 34.9* 35.7*  MCV 91.0  --  92.2 90.3 90.0 89.0 89.7  PLT 207  --  140* 154 151 170 195  < > = values in this interval not displayed.   PHYSICAL EXAM General - Well nourished, well developed,  Caucasian obese male not in distress. Ophthalmologic - fundi not visualized  Cardiovascular - Regular rate and rhythm.  Neuro - Awake alert mild expressive aphasia with hesitant speech but good comprehension and  naming.PERRL, EOMI, blinking to visual threat on the left but not on the right. Facial symmetry difficult to test due to ET. LUE and LLE purposeful movement and against gravity, RUE proximal 3-/5, finger movement 0/5, RLE 3/5 proximal and 2/5 distally. DTR 1+ and no babinski bilaterally. Sensation, coordination not cooperative. Gait not tested.    ASSESSMENT/PLAN [name] is a 61 y.o. male with history of HTN, HLD admitted for aphasia and right side weakness. Received IV tPA and endovascular intervention.    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 Failed attempt left ICA stenting. Infarct secondary to large vessel disease source given bilateral ICA high grade stenosis with severely calcified plaques  Code stroke CT hyperdense L MCA. Aspects 10.  CTA head and neck showed left ICA and left M1 occlusion, right ICA proximal at least  90% stenosis  Cerebral angio tandem L ICA and MCA occlusion with angioplasty and mechanical throbectomy. TICI 3. (solitaire 1 pass). High grade R ICA stenosis 73% with string sign. Attempted stent placement  MRI left MCA patchy infarcts with small hemorrhagic transformation  MRA left ICA not visualized but reconstituted at cavernous portion, left MCA patent  CUS - There is 80-99% right ICA stenosis.  The left ICA appears occluded.  2D Echo  EF 55-60%, no SOE. Grade 2 DD  LDL 78  HgbA1c 7.0  Heparin subq for VTE prophylaxis  Diet Carb Modified Fluid consistency: Thin; Room service appropriate? Yes   No antithrombotic prior to admission, now on ASA and plavix given large vessel disease. Plan x 3 months then plavix alone. May be altered based on potential stent placement  Ongoing aggressive stroke risk factor management  Therapy recommendations:  CIR. medically ready for discharge, await bed. Insurance approval underway  Disposition:  Pending  b/l ICA stenosis  Left  ICA occlusion on CTA but now likely high grade stenosis post intervention  MRA no visualization of left ICA but reconstituted at cavernous portion  CUS - left ICA appears occluded.  Failed left ICA stenting  Dr. Pearlean Brownie discussed with Dr. Corliss Skains. Plan R ICA stent at the end of IP rehab prior to return home  BP goal < 180  Respiratory failure  S/P intubation for IR procedure - Extubated 02/17/2017  CXR much improved  Diabetes, new diagnosis  HgbA1c 7.0 goal < 7.0  Glu 109-164  Hypertension  Home meds:   Amlodipine and cozaar  BP goal < 180  Stable   s/p albumin 4 doses  Hyperlipidemia  Home meds:  crestor 40  LDL 78, goal < 70  Resumed crestor   Continue statin at discharge  Other Stroke Risk Factors  Advanced age  UDS/ETOH not performed   Obesity, Body mass index is 30.28 kg/m.   Other Active Problems  Elevated BUN -> resolved  Mild anemia - 12.2 / 35.9  Hospital day # 6    D/w Rehab coordinator and patient`s wife. Medically stable for transfer to rehabilitation when bed available Delia Heady, MD Medical Director [facility] Pager: [phone] 02/22/2017 2:56 PM To contact Stroke Continuity provider, please refer to [email or web address]. After hours, contact General Neurology

## 2017-02-22 NOTE — Progress Notes (Signed)
CSW spoke with [place] representative and CIR coordinator this morning- [employer] rep cannot start auth for CIR or for SNF until her supervisors determine if the patient has insurance benefits at all.  Rosann Auerbach rep states that if they verify he has benefits she has reviewed his clinicals and she would want to approve him for CIR  CSW will continue to follow but [facility] cannot start auth until determination is made regarding if pt has benefits- no facilities will take LOG at this time  Burna Sis, LCSW Clinical Social Worker [phone]

## 2017-02-22 NOTE — Discharge Summary (Signed)
Stroke Discharge Summary  Patient ID: Erik [name]   MRN: [id number]      DOB: 09-19-1956  Date of Admission: 02/16/2017 Date of Discharge: 02/22/2017  Attending Physician:  Micki Riley, MD, Stroke MD Consultant(s):   Girtha Rm, MD (pulmonary/intensive care), Maryla Morrow, MD (Physical Medicine & Rehabtilitation)  Patient's PCP:  Mick Sell, MD  DISCHARGE DIAGNOSIS:  Principal Problem:   CVA (cerebral vascular accident) (HCC) - L MCA s/p tPA and thrombectomy-symptomatic from proximal left ICA stenosis Active Problems:   Acid reflux   HLD (hyperlipidemia)   BPH with obstruction/lower urinary tract symptoms   Status post total bilateral knee replacement   Benign essential HTN   Right-sided extracranial carotid artery stenosis   Dysphagia, post-stroke   Acute blood loss anemia   Nocturia   Newly diagnosed diabetes (HCC)   ICAO (internal carotid artery occlusion), left   Acute respiratory failure (HCC)   Obesity   Expressive aphasia  BMI: Body mass index is 30.28 kg/m.  Past Medical History:  Diagnosis Date  . Anxiety   . BPH (benign prostatic hyperplasia)   . ED (erectile dysfunction)   . H/O calcium pyrophosphate deposition disease (CPPD)   . HLD (hyperlipidemia)   . Hypogonadism in male   . Lower urinary tract symptoms (LUTS)   . Nocturia   . Testosterone overdose   . Vitamin D deficiency    Past Surgical History:  Procedure Laterality Date  . BACK SURGERY    . IR ANGIO INTRA EXTRACRAN SEL COM CAROTID INNOMINATE UNI R MOD SED  02/16/2017  . IR PERCUTANEOUS ART THROMBECTOMY/INFUSION INTRACRANIAL INC DIAG ANGIO  02/16/2017  . IR PTA NON CORO-LOWER EXTREM  02/16/2017  . IR US GUIDE VASC ACCESS LEFT  02/16/2017  . IR US GUIDE VASC ACCESS RIGHT  02/16/2017  . KNEE ARTHROSCOPY Bilateral   . RADIOLOGY WITH ANESTHESIA N/A 02/16/2017   Procedure: RADIOLOGY WITH ANESTHESIA;  Surgeon: Julieanne Cotton, MD;  Location: [facility] OR;  Service: Radiology;  Laterality:  N/A;  . ROTATOR CUFF REPAIR Right     Allergies as of 02/22/2017      Reactions   Celecoxib Nausea And Vomiting   Shellfish-derived Products Hives      Medication List    STOP taking these medications   phentermine 37.5 MG tablet Commonly known as:  ADIPEX-P   tadalafil 5 MG tablet Commonly known as:  CIALIS     TAKE these medications   amLODipine 10 MG tablet Commonly known as:  NORVASC Take 10 mg by mouth daily.   aspirin 325 MG EC tablet Take 1 tablet (325 mg total) by mouth daily. Start taking on:  02/23/2017   clopidogrel 75 MG tablet Commonly known as:  PLAVIX Take 1 tablet (75 mg total) by mouth daily. Start taking on:  02/23/2017   CRESTOR 40 MG tablet Generic drug:  rosuvastatin TAKE 1 TABLET (40 MG TOTAL) BY MOUTH NIGHTLY.   FLUoxetine 20 MG capsule Commonly known as:  PROZAC Take 20 mg by mouth daily.   heparin 5000 UNIT/ML injection Inject 1 mL (5,000 Units total) into the skin every 8 (eight) hours.   hydrochlorothiazide 25 MG tablet Commonly known as:  HYDRODIURIL TAKE 1 TABLET (25 MG TOTAL) BY MOUTH ONCE DAILY.   insulin aspart 100 UNIT/ML injection Commonly known as:  novoLOG Inject 0-9 Units into the skin 3 (three) times daily with meals.   losartan 25 MG tablet Commonly known as:  COZAAR  TAKE 1 TABLET (25 MG TOTAL) BY MOUTH ONCE DAILY.   multivitamin tablet Take 1 tablet by mouth daily.   omeprazole 40 MG capsule Commonly known as:  PRILOSEC TAKE 1 CAPSULE (40 MG TOTAL) BY MOUTH ONCE DAILY.   tamsulosin 0.4 MG Caps capsule Commonly known as:  FLOMAX Take 0.4 mg by mouth daily.       LABORATORY STUDIES CBC    Component Value Date/Time   WBC 5.9 02/21/2017 0558   RBC 3.98 (L) 02/21/2017 0558   HGB 12.4 (L) 02/21/2017 0558   HGB 14.2 11/04/2013 0434   HCT 35.7 (L) 02/21/2017 0558   HCT 41.0 11/04/2013 0434   PLT 195 02/21/2017 0558   PLT 182 11/04/2013 0434   MCV 89.7 02/21/2017 0558   MCV 93 11/04/2013 0434   MCH 31.2  02/21/2017 0558   MCHC 34.7 02/21/2017 0558   RDW 12.8 02/21/2017 0558   RDW 12.5 11/04/2013 0434   LYMPHSABS 2.4 02/16/2017 0038   LYMPHSABS 0.8 (L) 11/04/2013 0434   MONOABS 0.8 02/16/2017 0038   MONOABS 1.0 11/04/2013 0434   EOSABS 0.1 02/16/2017 0038   EOSABS 0.0 11/04/2013 0434   BASOSABS 0.0 02/16/2017 0038   BASOSABS 0.0 11/04/2013 0434   BASOSABS 0 09/15/2012 1610   CMP    Component Value Date/Time   NA 139 02/19/2017 0428   NA 130 (L) 11/04/2013 0434   K 3.9 02/19/2017 0428   K 3.8 11/04/2013 0434   CL 102 02/19/2017 0428   CL 98 11/04/2013 0434   CO2 26 02/19/2017 0428   CO2 27 11/04/2013 0434   GLUCOSE 112 (H) 02/19/2017 0428   GLUCOSE 179 (H) 11/04/2013 0434   BUN 15 02/19/2017 0428   BUN 15 11/04/2013 0434   CREATININE 0.76 02/19/2017 0428   CREATININE 0.81 11/04/2013 0434   CALCIUM 9.2 02/19/2017 0428   CALCIUM 9.0 11/04/2013 0434   PROT 6.9 02/16/2017 0038   ALBUMIN 4.4 02/16/2017 0038   AST 30 02/16/2017 0038   ALT 29 02/16/2017 0038   ALKPHOS 33 (L) 02/16/2017 0038   BILITOT 0.6 02/16/2017 0038   GFRNONAA >60 02/19/2017 0428   GFRNONAA >60 11/04/2013 0434   GFRAA >60 02/19/2017 0428   GFRAA >60 11/04/2013 0434   COAGS Lab Results  Component Value Date   INR 0.97 02/16/2017   INR 1.0 09/12/2013   Lipid Panel    Component Value Date/Time   CHOL 135 02/16/2017 0612   TRIG 144 02/16/2017 0612   HDL 28 (L) 02/16/2017 0612   CHOLHDL 4.8 02/16/2017 0612   VLDL 29 02/16/2017 0612   LDLCALC 78 02/16/2017 0612   HgbA1C  Lab Results  Component Value Date   HGBA1C 7.0 (H) 02/16/2017    SIGNIFICANT DIAGNOSTIC STUDIES Ct Head Code Stroke W/o Cm 02/16/2017 1. Hyper dense left middle cerebral artery, likely indicating the presence of acute thrombus. 2. ASPECTS is 10. 3. No intracranial hemorrhage.   Ct Angio Head W Or Wo Contrast Ct Angio Neck W Or Wo Contrast 02/16/2017 1. Emergent large vessel occlusion of the left middle cerebral artery at  its origin. Distal collateral flow within the left MCA distribution persists and is relatively symmetric to the right side. 2. 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 3. At least 90% stenosis at the origin of the right internal carotid artery secondary to mixed calcified and noncalcified  plaque.   Cerebral Angio  02/16/2017 Status post treatment of tandem occlusion of left ICA and MCA with balloon angioplasty of advanced proximal ICA disease and mechanical thrombectomy of acute left M1 occlusion. Restoration of TICI 3 flow was achieved with 1 pass of solitaire device and local aspiration, and flow through the left ICA occlusion was re-established by balloon angioplasty. Dense calcified plaque and the geometry of the proximal left ICA precluded placement of the selected Exact stent, and the patient will remain on medical therapy. Angiogram of the right carotid system demonstrates high-grade stenosis of proximal right ICA secondary to advanced atherosclerotic changes. Although the angiographic measurement of the string sign is 73% stenosis by NASCET criteria, the degree of stenosis is favored to be greater, as the distal vessel is compromised in diameter given the low flow. Signed, Yvone Neu. Loreta Ave DO [facility] Radiology PLAN: Noncontrast head CT postprocedure. ICU admission. Continue anti-platelet medication. Right common femoral 9 French sheath maintained which will be removed after 12 hours. Left common femoral 4 French sheath maintained for hemodynamic monitoring. Electronically Signed   By: Gilmer Mor D.O.   On: 02/16/2017 11:18    Ct Head Wo Contrast 02/16/2017 1. Status post stroke intervention with resolution of previously seen hyperdense thrombus in the left middle cerebral artery. 2. No acute hemorrhage or cytotoxic  edema.  Mr Brain 2 Contrast Mr Maxine Glenn Head Wo Contrast 02/17/2017 1. Multiple areas of acute or early subacute ischemia within the supratentorial left hemisphere, with the largest areas located in the left basal ganglia and left temporal lobe. There is associated cytotoxic edema without midline shift herniation or other significant mass effect. 2. Petechial hemorrhage in the posterior left temporal lobe and peripheral left basal ganglia without space-occupying hematoma. Short interval follow-up head CT is recommended. 3. No flow related enhancement within the visualized left internal carotid artery and diminished enhancement in the right internal carotid artery. This may be due to slow flow, though recurrent occlusion of the treated left internal carotid artery would be difficult to exclude. CTA of the neck may be helpful to assess possible residual or recurrent occlusion/stenosis. 4. Normal flow related enhancement of the left middle cerebral artery.   Dg Chest Port 1 View 02/17/2017 1. Stable support apparatus. 2. Improved aeration to the lungs with persistent RIGHT basilar atelectasis.  02/16/2017 Cardiomegaly with mild central vascular congestion and low lung volumes Scattered mild bilateral streaky edema pattern and atelectasis. Support apparatus in good position as above.   Dg Abd Portable 1v 02/16/2017 OG tube tip in the region of the pylorus and may be in the duodenal bulb or proximal stomach.   2D Echocardiogram  - Left ventricle: The cavity size was normal. There was mild concentric hypertrophy. Systolic function was normal. The estimated ejection fraction was in the range of 55% to 60%. Wall motion was normal; there were no regional wall motion abnormalities. Features are consistent with a pseudonormal left ventricular filling pattern, with concomitant abnormal relaxation and increased filling pressure (grade 2 diastolic dysfunction). - Left atrium: The atrium was mildly dilated. - Atrial  septum: No defect or patent foramen ovale was identified.  Carotid Doppler   Right: severe mixed plaque origin and proximal ICA and ECA. 80-99% ICA stenosis, highest end of scale. Left: severe mixed plaque origin and proximal ICA and ECA. ICA appears occluded. Bilateral vertebral artery flow is antegrade. Right ICA/CCA ratio: 8.47.   HISTORY OF PRESENT ILLNESS AUDEN TATAR is a 61  y.o. male with a history of hypertension who was last in his normal state of health around 11:15 PM 02/16/2017. He had an episode earlier where he felt like his legs might give out on him, but this completely resolved and he came back completely to normal. They went to a baseball game and were climbing bleachers and his wife noticed that he didn't have any issues. Around 11:15 PM, his wife went upstairs take a bath and then when she came out she found he was on the ground unable to speak. He was brought in as a code stroke, and initially there was some delay with IV TPA due to not being able to get full history, but risks and benefits were discussed with his wife then treatment was undertaken. CTA was performed which demonstrated left M1 occlusion and therefore Dr. Loreta Ave with interventional radiology was consulted. Prior to starting IV TPA, he did have some improvement and began following some commands, but still had a disabling deficit and therefore tPA was administered. He was admitted to the neuro ICU for further evaluation and treatment.   HOSPITAL COURSE Mr. KOBEN DAMAN is a 61 y.o. male with history of HTN, HLD admitted for aphasia and right side weakness. Received IV tPA and endovascular intervention.    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 Failed attempt left ICA stenting. Infarct secondary to large vessel disease source given bilateral ICA high grade stenosis with severely calcified plaques  Code stroke CT hyperdense L MCA.  Aspects 10.  CTA head and neck showed left ICA and left M1 occlusion, right ICA proximal at least 90% stenosis  Cerebral angio tandem L ICA and MCA occlusion with angioplasty and mechanical throbectomy. TICI 3. (solitaire 1 pass). High grade R ICA stenosis 73% with string sign. Attempted stent placement  Post IR CT resolution of previous L MCA hyperdense thrombus. No hemorrhage or edema  MRI left MCA patchy infarcts with small hemorrhagic transformation  MRA left ICA not visualized but reconstituted at cavernous portion, left MCA patent  CUS - There is 80-99% right ICA stenosis. The left ICA appears occluded.  2D Echo  EF 55-60%, no SOE. Grade 2 DD  LDL 78  HgbA1c 7.0  No antithrombotic prior to admission, now on ASA and plavix given large vessel disease. Plan x 3 months then plavix alone. May be altered based on potential stent placement  Ongoing aggressive stroke risk factor management  Therapy recommendations:  CIR  Disposition:  CIR  B/L ICA stenosis  Left ICA occlusion on CTA but now likely high grade stenosis post intervention  MRA no visualization of left ICA but reconstituted at cavernous portion  CUS - left ICA appears occluded.  Failed left ICA stenting  Dr. Pearlean Brownie discussed with Dr. Corliss Skains. Plan R ICA stent at the end of IP rehab prior to return home  Respiratory failure  S/P intubation for IR procedure - Extubated 02/17/2017  CXR much improved  Diabetes, new diagnosis  HgbA1c 7.0 goal < 7.0  Glu 109-164  Hypertension  Home meds:   Amlodipine and cozaar  Received albumin x 4 doses  Now on amlodipine 10, cozaar 25, HCTZ 25  Hyperlipidemia  Home meds:  crestor 40  LDL 78, goal < 70  Resumed crestor   Continue statin at discharge  Other Stroke Risk Factors  Advanced age  UDS/ETOH not performed   Obesity, Body mass index is 31.47 kg/m.  Other Active Problems  Elevated BUN -> resolved  Mild anemia - 12.2 / 35.9  GERD  on protonix   DISCHARGE EXAM Blood pressure 124/66, pulse 64, temperature 98.3 F (36.8 C), temperature source Oral, resp. rate 17, height  (1.778 m), weight 95.7 kg (211 lb 1 oz), SpO2 97 %. General - Well nourished, well developed,  Caucasian obese male not in distress. Ophthalmologic - fundi not visualized Cardiovascular - Regular rate and rhythm. Neuro - Awake alert mild expressive aphasia with hesitant speech but good comprehension and naming.PERRL, EOMI, blinking to visual threat on the left but not on the right. Facial symmetry difficult to test. LUE and LLE purposeful movement and against gravity, RUE proximal 3-/5, finger movement 0/5, RLE 3/5 proximal and 2/5 distally. DTR 1+ and no babinski bilaterally. Sensation, coordination not cooperative. Gait not tested.   Discharge Diet   Diet Carb Modified Fluid consistency: Thin; Room service appropriate? Yes liquids  DISCHARGE PLAN  Disposition:  Transfer to [facility] for ongoing PT, OT and ST  aspirin 325 mg daily and clopidogrel 75 mg daily for secondary stroke prevention.  Recommend ongoing risk factor control by Primary Care Physician at time of discharge from inpatient rehabilitation.  Follow-up FITZGERALD, DAVID P, MD in 2 weeks.  Follow-up with Dr. Delia Heady, Stroke Clinic in 6 weeks, office to schedule an appointment.  45 minutes were spent preparing discharge. I have personally examined this patient, reviewed notes, independently viewed imaging studies, participated in medical decision making and plan of care.ROS completed by me personally and pertinent positives fully documented  I have made any additions or clarifications directly to the above note.    Delia Heady, MD Medical Director [facility] Pager: [phone] 02/22/2017 4:03 PM

## 2017-02-22 NOTE — H&P (Signed)
Physical Medicine and Rehabilitation Admission H&P    CC: Right sided weakness and right facial droop.    HPI:  Erik [name] is a 61 y.o. male with history of BPH, OA s/p B-TKR , HTN (wife/patient deny) who was admitted on 02/16/17 after being found by wife lying on the ground with inability to speak. History taken from chart review, patient, and wife. CTA head/neck revealed hyperdense L-MCA sign likely indicating acute thrombus. He received tPA  and he underwent cerebral angiogram with L-MCA thrombectomy, dense calcified plaque L-ICA precluded stent placement and noted to have 73% stenosis R-ICA due to advanced atherosclerotic changes. MRI/MRA brain reviewed, showing multiple areas of infarct. Per report, multiple areas of acute or early ischemia within left supratentorial hemisphere--largest affecting left basal and temporal lobe with cytotoxic edema, petechial hemorrhage in left posterior temporal and peripheral left basal ganglia. Carotid dopplers with right severe mixed plaque origin and proximal ICA/ECA 80-99%, plan for elective surgery post-rehab.  2D echo with EF 55-60% with no wall abnormality and grade 2 diastolic dysfunction. Patient on ASA/Plavix X 3 months followed by plavix alone and right ICA stent at end of rehab stay. Patient with resultant facial weakness with mild dysarthria, mild dysphagia, aphasia, delayed processing with slow responses, mild left gaze preference and right sided weakness affecting mobility and ability to carry out ADL tasks. CIR recommended for follow up therpay.    Review of Systems  HENT: Positive for hearing loss. Negative for ear pain and tinnitus.   Eyes: Negative for blurred vision and double vision.  Respiratory: Negative for cough, shortness of breath and wheezing.   Cardiovascular: Negative for chest pain, palpitations and leg swelling.  Gastrointestinal: Negative for constipation, heartburn and nausea.  Genitourinary: Negative for dysuria and  urgency.  Musculoskeletal: Negative for back pain, joint pain and myalgias.  Skin: Negative for rash.  Neurological: Positive for speech change and focal weakness. Negative for dizziness, sensory change and headaches.  Psychiatric/Behavioral: Negative for memory loss. The patient is not nervous/anxious and does not have insomnia.       Past Medical History:  Diagnosis Date  . Anxiety   . Arthritis    hands  . BPH (benign prostatic hyperplasia)   . ED (erectile dysfunction)   . H/O calcium pyrophosphate deposition disease (CPPD)   . HLD (hyperlipidemia)   . Hypogonadism in male   . Lower urinary tract symptoms (LUTS)   . Nocturia   . Testosterone overdose   . Vitamin D deficiency     Past Surgical History:  Procedure Laterality Date  . BACK SURGERY    . IR ANGIO INTRA EXTRACRAN SEL COM CAROTID INNOMINATE UNI R MOD SED  02/16/2017  . IR PERCUTANEOUS ART THROMBECTOMY/INFUSION INTRACRANIAL INC DIAG ANGIO  02/16/2017  . IR PTA NON CORO-LOWER EXTREM  02/16/2017  . IR US GUIDE VASC ACCESS LEFT  02/16/2017  . IR US GUIDE VASC ACCESS RIGHT  02/16/2017  . KNEE ARTHROSCOPY Bilateral   . RADIOLOGY WITH ANESTHESIA N/A 02/16/2017   Procedure: RADIOLOGY WITH ANESTHESIA;  Surgeon: Julieanne Cotton, MD;  Location: [facility] OR;  Service: Radiology;  Laterality: N/A;  . ROTATOR CUFF REPAIR Right     Family History  Problem Relation Age of Onset  . Diabetes Mother   . Hypertension Mother   . Dementia Mother   . Malig Hypertension Mother   . Hyperlipidemia Mother   . Heart Problems Mother   . Heart Problems Brother     Social History:  Married. Works for [employer]. He  reports that he has never smoked. He does not have any smokeless tobacco history on file. He reports that he drinks alcohol--one mixed drink at nights and beer occasionally.  His drug history is not on file.    Allergies  Allergen Reactions  . Celecoxib Nausea And Vomiting  . Shellfish-Derived Products Hives    Medications Prior to  Admission  Medication Sig Dispense Refill  . amLODipine (NORVASC) 10 MG tablet Take 10 mg by mouth daily.  11  . [START ON 02/23/2017] aspirin EC 325 MG EC tablet Take 1 tablet (325 mg total) by mouth daily. 30 tablet 0  . [START ON 02/23/2017] clopidogrel (PLAVIX) 75 MG tablet Take 1 tablet (75 mg total) by mouth daily.    . CRESTOR 40 MG tablet TAKE 1 TABLET (40 MG TOTAL) BY MOUTH NIGHTLY.  3  . FLUoxetine (PROZAC) 20 MG capsule Take 20 mg by mouth daily.  3  . heparin 5000 UNIT/ML injection Inject 1 mL (5,000 Units total) into the skin every 8 (eight) hours. 1 mL   . hydrochlorothiazide (HYDRODIURIL) 25 MG tablet TAKE 1 TABLET (25 MG TOTAL) BY MOUTH ONCE DAILY.  3  . insulin aspart (NOVOLOG) 100 UNIT/ML injection Inject 0-9 Units into the skin 3 (three) times daily with meals. 10 mL 11  . losartan (COZAAR) 25 MG tablet TAKE 1 TABLET (25 MG TOTAL) BY MOUTH ONCE DAILY.  3  . Multiple Vitamin (MULTIVITAMIN) tablet Take 1 tablet by mouth daily.    Marland Kitchen omeprazole (PRILOSEC) 40 MG capsule TAKE 1 CAPSULE (40 MG TOTAL) BY MOUTH ONCE DAILY.  3  . tamsulosin (FLOMAX) 0.4 MG CAPS capsule Take 0.4 mg by mouth daily.  3    Home: Home Living Family/patient expects to be discharged to:: Private residence Living Arrangements: Spouse/significant other   Functional History:    Functional Status:  Mobility:          ADL:    Cognition:      Physical Exam: Blood pressure 137/62, pulse 66, temperature 98.2 F (36.8 C), temperature source Oral, resp. rate 18, height  (1.803 m), SpO2 100 %. Physical Exam  Nursing note and vitals reviewed. Constitutional: He is oriented to person, place, and time. He appears well-developed and well-nourished.  HENT:  Head: Normocephalic and atraumatic.  Mouth/Throat: Oropharynx is clear and moist.  Eyes: Conjunctivae and EOM are normal. Pupils are equal, round, and reactive to light.  Neck: Normal range of motion. Neck supple.  Cardiovascular: Normal  rate and regular rhythm.   Respiratory: Effort normal and breath sounds normal. No stridor. No respiratory distress. He has no wheezes.  GI: Soft. Bowel sounds are normal. He exhibits no distension. There is no tenderness.  Musculoskeletal: He exhibits no edema or tenderness.  Neurological: He is alert and oriented to person, place, and time. A cranial nerve deficit is present.  Right facial droop with minimal dysarthria. Able to follow commands without difficulty. Has good insight/awareness of deficits.   Skin: Skin is warm and dry.  Psychiatric: He has a normal mood and affect. His behavior is normal. Judgment and thought content normal.  Strength is 4/5 in the right deltoid, bicep, tricep, grip, hip flexor, knee extensor, ankle dorsal flexor 5/5 in the left deltoid, bicep, tricep, grip, hip flexor, knee extensor, ankle dorsi flexor Decrease finger thumb opposition on the right hand. Decreased dysdiadochokinesis on the right hand. Sensation reported as equal bilateral upper and lower limbs Speech dysarthric with  word finding deficits, speech is at a short phrase level  Results for orders placed or performed during the hospital encounter of 02/22/17 (from the past 48 hour(s))  CBC WITH DIFFERENTIAL     Status: Abnormal   Collection Time: 02/22/17  4:30 PM  Result Value Ref Range   WBC 6.1 4.0 - 10.5 K/uL   RBC 4.20 (L) 4.22 - 5.81 MIL/uL   Hemoglobin 13.4 13.0 - 17.0 g/dL   HCT 16.1 (L) 09.6 - 04.5 %   MCV 91.7 78.0 - 100.0 fL   MCH 31.9 26.0 - 34.0 pg   MCHC 34.8 30.0 - 36.0 g/dL   RDW 40.9 81.1 - 91.4 %   Platelets 206 150 - 400 K/uL   Neutrophils Relative % 51 %   Neutro Abs 3.1 1.7 - 7.7 K/uL   Lymphocytes Relative 35 %   Lymphs Abs 2.2 0.7 - 4.0 K/uL   Monocytes Relative 12 %   Monocytes Absolute 0.7 0.1 - 1.0 K/uL   Eosinophils Relative 2 %   Eosinophils Absolute 0.1 0.0 - 0.7 K/uL   Basophils Relative 0 %   Basophils Absolute 0.0 0.0 - 0.1 K/uL  Glucose, capillary      Status: Abnormal   Collection Time: 02/22/17  4:46 PM  Result Value Ref Range   Glucose-Capillary 114 (H) 65 - 99 mg/dL   No results found.     Medical Problem List and Plan: 1.  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  DVT Prophylaxis/Anticoagulation: Pharmaceutical: Lovenox 3. Pain Management: N/A 4. Mood: On Prozac. LCSW to follow for evaluation and support.  5. Neuropsych: This patient is capable of making decisions on his own behalf. 6. Skin/Wound Care: routine pressure relief measures.  7. Fluids/Electrolytes/Nutrition: Monitor I/O. 8. NWG:NFAOZHY BP bid. Currently well controlled. Will monitor to monitor on Norvasc, HCTZ and Cozaar. Encourage compliance.  9. Dyslipidemia: Continue lipitor.  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  11. T2DM: New diagnosis with Hgb A1C 7.0. Monitor BS ac/hs and use SSI for elevated BS. Will start patient on low dose metformin and titrate upwards as indicated.  12. Anemia: Continue to monitor.     Post Admission Physician Evaluation: 1. Functional deficits secondary  to L MCA infarct. 2. Patient is admitted to receive collaborative, interdisciplinary care between the physiatrist, rehab nursing staff, and therapy team. 3. Patient's level of medical complexity and substantial therapy needs in context of that medical necessity cannot be provided at a lesser intensity of care such as a SNF. 4. Patient has experienced substantial functional loss from his/her baseline which was documented above under the "Functional History" and "Functional Status" headings.  Judging by the patient's diagnosis, physical exam, and functional history, the patient has potential for functional progress which will result in measurable gains while on inpatient rehab.  These gains will be of substantial and practical use upon discharge  in facilitating mobility and self-care at the household level. 5. Physiatrist will provide 24 hour management of medical  needs as well as oversight of the therapy plan/treatment and provide guidance as appropriate regarding the interaction of the two. 6. The Preadmission Screening has been reviewed and patient status is unchanged unless otherwise stated above. 7. 24 hour rehab nursing will assist with bladder management, bowel management, safety, skin/wound care, disease management, medication administration, pain management and patient education  and help integrate therapy concepts, techniques,education, etc. 8. PT will assess and treat for/with: pre gait, gait training, endurance ,  safety, equipment, neuromuscular re education.   Goals are: Mod I/Sup. 9. OT will assess and treat for/with: ADLs, Cognitive perceptual skills, Neuromuscular re education, safety, endurance, equipment.   Goals are: Mod I/Sup. Therapy may proceed with showering this patient. 10. SLP will assess and treat for/with: Aphasia, dysarthria.  Goals are: Sentence level. Expressive language with 75% speech intelligibility. 11. Case Management and Social Worker will assess and treat for psychological issues and discharge planning. 12. Team conference will be held weekly to assess progress toward goals and to determine barriers to discharge. 13. Patient will receive at least 3 hours of therapy per day at least 5 days per week. 14. ELOS: 7d       15. Prognosis:  excellent     LOVE,PAM PA-C Erick Colace, MD 02/22/2017

## 2017-02-22 NOTE — Care Management Note (Signed)
Case Management Note  Patient Details  Name: Erik [name] MRN: [id number] Date of Birth: [birth year]  Subjective/Objective:                    Action/Plan: Pt discharging to CIR today. No further needs per CM.   Expected Discharge Date:                  Expected Discharge Plan:  IP Rehab Facility  In-House Referral:     Discharge planning Services     Post Acute Care Choice:    Choice offered to:     DME Arranged:    DME Agency:     HH Arranged:    HH Agency:     Status of Service:  Completed, signed off  If discussed at [employer] of Stay Meetings, dates discussed:    Additional Comments:  [name], RN 02/22/2017, 12:36 PM

## 2017-02-22 NOTE — Progress Notes (Signed)
1604 02/22/17 nursing To Rehab unit per wheelchair accompanied by RN and wife alert and oriented patient. Fall prevention bundle discussed with wife and patient. Oriented to unit set up. Needs attended to and questions answered by RN.

## 2017-02-23 ENCOUNTER — Inpatient Hospital Stay (HOSPITAL_COMMUNITY): Payer: Managed Care, Other (non HMO) | Admitting: Speech Pathology

## 2017-02-23 ENCOUNTER — Inpatient Hospital Stay (HOSPITAL_COMMUNITY): Payer: Managed Care, Other (non HMO) | Admitting: Occupational Therapy

## 2017-02-23 ENCOUNTER — Inpatient Hospital Stay (HOSPITAL_COMMUNITY): Payer: Managed Care, Other (non HMO) | Admitting: Physical Therapy

## 2017-02-23 DIAGNOSIS — I69320 Aphasia following cerebral infarction: Secondary | ICD-10-CM

## 2017-02-23 LAB — GLUCOSE, CAPILLARY
GLUCOSE-CAPILLARY: 125 mg/dL — AB (ref 65–99)
GLUCOSE-CAPILLARY: 151 mg/dL — AB (ref 65–99)
Glucose-Capillary: 106 mg/dL — ABNORMAL HIGH (ref 65–99)
Glucose-Capillary: 164 mg/dL — ABNORMAL HIGH (ref 65–99)

## 2017-02-23 NOTE — Plan of Care (Signed)
Problem: Food- and Nutrition-Related Knowledge Deficit (NB-1.1) Goal: Nutrition education Formal process to instruct or train a patient/client in a skill or to impart knowledge to help patients/clients voluntarily manage or modify food choices and eating behavior to maintain or improve health. Outcome: Completed/Met Date Met: 02/23/17  RD consulted for nutrition education regarding diabetes.   Lab Results  Component Value Date   HGBA1C 7.0 (H) 02/16/2017    RD provided "Carbohydrate Counting for People with Diabetes" handout from the Academy of Nutrition and Dietetics. Wife at bedside. Discussed different food groups and their effects on blood sugar, emphasizing carbohydrate-containing foods. Provided list of carbohydrates and recommended serving sizes of common foods.  Discussed importance of controlled and consistent carbohydrate intake throughout the day. Provided examples of ways to balance meals/snacks and encouraged intake of high-fiber, whole grain complex carbohydrates. Discussed diabetic friendly drink options. Teach back method used.  Expect good compliance.  Body mass index is 29.3 kg/m. Pt meets criteria for overweight based on current BMI.  Current diet order is carbohydrate modified, patient is consuming approximately 90% of meals at this time. Labs and medications reviewed. No further nutrition interventions warranted at this time. RD contact information provided. If additional nutrition issues arise, please re-consult RD.  Corrin Parker, MS, RD, LDN Pager # [phone] After hours/ weekend pager # [phone]

## 2017-02-23 NOTE — Evaluation (Signed)
Occupational Therapy Assessment and Plan  Patient Details  Name: Erik [name] MRN: [id number] Date of Birth: August 11, 1956  OT Diagnosis: ataxia and muscle weakness (generalized) Rehab Potential: Rehab Potential (ACUTE ONLY): Excellent ELOS: 7-10 days   Today's Date: 02/23/2017 OT Individual Time: 0800-0900 OT Individual Time Calculation (min): 60 min     Problem List:  Patient Active Problem List   Diagnosis Date Noted  . ICAO (internal carotid artery occlusion), left 02/22/2017  . Acute respiratory failure ([place]) 02/22/2017  . Obesity 02/22/2017  . Expressive aphasia 02/22/2017  . Acute ischemic left MCA stroke ([place]) 02/22/2017  . Left hemiparesis ([place])   . Status post total bilateral knee replacement   . Benign essential HTN   . Right-sided extracranial carotid artery stenosis   . Dysphagia, post-stroke   . Acute blood loss anemia   . Nocturia   . Newly diagnosed diabetes ([place])   . CVA (cerebral vascular accident) ([place]) - L MCA s/p tPA and thrombectomy 02/16/2017  . Anxiety 04/30/2015  . Benign fibroma of prostate 04/30/2015  . Clinical depression 04/30/2015  . Acid reflux 04/30/2015  . HLD (hyperlipidemia) 04/30/2015  . BP (high blood pressure) 04/30/2015  . Arthritis, degenerative 04/30/2015  . 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 Arthritis, septic, knee ([place]) 09/26/2012    Past Medical History:  Past Medical History:  Diagnosis Date  . Anxiety   . Arthritis    hands  . BPH (benign prostatic hyperplasia)   . ED (erectile dysfunction)   . H/O calcium pyrophosphate deposition disease (CPPD)   . HLD (hyperlipidemia)   . Hypogonadism in male   . Lower urinary tract symptoms (LUTS)   . Nocturia   . Testosterone overdose   . Vitamin D deficiency    Past Surgical History:  Past Surgical History:  Procedure Laterality Date  . BACK SURGERY    . IR ANGIO INTRA EXTRACRAN SEL COM CAROTID INNOMINATE UNI R MOD SED  02/16/2017  . IR  PERCUTANEOUS ART THROMBECTOMY/INFUSION INTRACRANIAL INC DIAG ANGIO  02/16/2017  . IR PTA NON CORO-LOWER EXTREM  02/16/2017  . IR US GUIDE VASC ACCESS LEFT  02/16/2017  . IR US GUIDE VASC ACCESS RIGHT  02/16/2017  . KNEE ARTHROSCOPY Bilateral   . RADIOLOGY WITH ANESTHESIA N/A 02/16/2017   Procedure: RADIOLOGY WITH ANESTHESIA;  Surgeon: Luanne Bras, MD;  Location: [facility];  Service: Radiology;  Laterality: N/A;  . ROTATOR CUFF REPAIR Right     Assessment & Plan Clinical Impression: Patient is a 61 y.o. year old male who was admitted on 02/16/17 after being found by wife lying on the ground with inability to speak. History taken from chart review, patient, and wife. CTA head/neck revealed hyperdense L-MCA sign likely indicating acute thrombus. He received tPA  and he underwent cerebral angiogram with L-MCA thrombectomy, dense calcified plaque L-ICA precluded stent placement and noted to have 73% stenosis R-ICA due to advanced atherosclerotic changes. MRI/MRA brain reviewed, showing multiple areas of infarct. Per report, multiple areas of acute or early ischemia within left supratentorial hemisphere--largest affecting left basal and temporal lobe with cytotoxic edema, petechial hemorrhage in left posterior temporal and peripheral left basal ganglia.  Patient transferred to CIR on 02/22/2017 .    Patient currently requires min with basic self-care skills secondary to muscle weakness, ataxia and decreased coordination, decreased attention to right, decreased attention, decreased problem solving, decreased memory and delayed processing and decreased standing balance and decreased balance strategies.  Prior to hospitalization, patient  could complete ADL and IADL with independent .  Patient will benefit from skilled intervention to increase independence with basic self-care skills prior to discharge home with care partner.  Anticipate patient will require intermittent assistance for higher level iADL tasks and  follow up outpatient.  OT - End of Session Endurance Deficit: Yes Endurance Deficit Description: occasional rest breaks due to RLE fatigue OT Assessment Rehab Potential (ACUTE ONLY): Excellent OT Patient demonstrates impairments in the following area(s): Balance;Motor;Cognition;Perception;Safety OT Basic ADL's Functional Problem(s): Eating;Grooming;Bathing;Toileting;Dressing OT Advanced ADL's Functional Problem(s): Simple Meal Preparation;Light Housekeeping OT Transfers Functional Problem(s): Toilet;Tub/Shower OT Additional Impairment(s): Fuctional Use of Upper Extremity OT Plan OT Intensity: Minimum of 1-2 x/day, 45 to 90 minutes OT Frequency: 5 out of 7 days OT Duration/Estimated Length of Stay: 7-10 days OT Treatment/Interventions: Balance/vestibular training;Cognitive remediation/compensation;Discharge planning;DME/adaptive equipment instruction;Functional electrical stimulation;Functional mobility training;Neuromuscular re-education;Patient/family education;Self Care/advanced ADL retraining;Therapeutic Activities;Therapeutic Exercise;UE/LE Strength taining/ROM;UE/LE Coordination activities;Visual/perceptual remediation/compensation OT Self Feeding Anticipated Outcome(s): Mod I OT Basic Self-Care Anticipated Outcome(s): Mod I OT Toileting Anticipated Outcome(s): Mod I OT Bathroom Transfers Anticipated Outcome(s): Mod I OT Recommendation Patient destination: Home Follow Up Recommendations: Outpatient OT Equipment Recommended: To be determined Equipment Details: possibly a tub transfer bench  Skilled Therapeutic Intervention OT eval completed addressing rehab process, OT purpose, POC, ELOS, and goals. Treatment provided to address modified bathing/dressing, dynamic standing balance, and functional use of R UE. Pt ambulated to bathroom with RW, min guard A and intermittent min A for lateral LOB. Min A and VC for technique turning to sit onto shower chair.  Used guiding techniques to  integrate RUE during LB bathing/dressing. Moderate difficulty with in-hand manipulation skills during ADL. Delayed processing and slight impulsivity also noted during session. Pt left seated in recliner at end of session with needs met and spouse present.   OT Evaluation Precautions/Restrictions  Precautions Precautions: Fall Precaution Comments: mild R inattention during functional tasks Restrictions Weight Bearing Restrictions: No Pain Pain Assessment Pain Assessment: No/denies pain Home Living/Prior Functioning Home Living Family/patient expects to be discharged to:: Private residence Living Arrangements: Spouse/significant other, Children Available Help at Discharge: Family, Available PRN/intermittently (spouse, brother) Type of Home: House Home Access: Stairs to enter Technical brewer of Steps: 4-5 Entrance Stairs-Rails: Right, Left Home Layout: Two level, Bed/bath upstairs, Full bath on main level Alternate Level Stairs-Number of Steps: flight Alternate Level Stairs-Rails: Right, Left, Can reach both Bathroom Shower/Tub: Walk-in shower, Other (comment), Door Bathroom Toilet: Handicapped height  Lives With: Spouse IADL History Homemaking Responsibilities: Yes Meal Prep Responsibility: Primary Cleaning Responsibility: Primary Bill Paying/Finance Responsibility: Primary Prior Function Level of Independence: Independent with gait, Independent with transfers  Able to Take Stairs?: Yes Driving: Yes Vocation: Full time employment Vocation Requirements: works in shipping/receiving, 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#, computer work, even split sitting/standing Comments: drives, works full time in a Proofreader ADL ADL ADL Comments: Please see functional navigator Vision/Perception  Vision- History Baseline Vision/History: Wears glasses Wears Glasses: Reading only Vision- Assessment Additional Comments: to be further assessed Perception Perception: Impaired Inattention/Neglect: Does not  attend to right visual field Praxis Praxis: Intact  Cognition Overall Cognitive Status: Impaired/Different from baseline Arousal/Alertness: Awake/alert Orientation Level: Place;Person;Situation Person: Oriented Place: Oriented Situation: Oriented Year: 2017 Month: April Day of Week: Correct Memory: Impaired Memory Impairment: Decreased recall of new information;Decreased short term memory Immediate Memory Recall: Sock;Blue;Bed Memory Recall: Blue Memory Recall Blue: Without Cue Attention: Sustained Sustained Attention: Appears intact Awareness: Appears intact Awareness Impairment: Emergent impairment Problem Solving: Appears intact Safety/Judgment: Appears intact Sensation Sensation Light  Touch: Appears Intact (LEs) Proprioception: Appears Intact Coordination Gross Motor Movements are Fluid and Coordinated: Yes Fine Motor Movements are Fluid and Coordinated: No (moderate impairment on R) Finger Nose Finger Test: Dysmetria  Motor  Motor Motor: Within Functional Limits Mobility  Transfers Sit to Stand: 5: Supervision Stand to Sit: 5: Supervision  Trunk/Postural Assessment  Cervical Assessment Cervical Assessment: Within Functional Limits Thoracic Assessment Thoracic Assessment: Within Functional Limits Lumbar Assessment Lumbar Assessment: Within Functional Limits  Balance Balance Dynamic Sitting Balance Sitting balance - Comments: leans posteriorly with crossin his legs to don socks Dynamic Standing Balance Dynamic Standing - Balance Support: During functional activity Dynamic Standing - Level of Assistance: 4: Min assist Dynamic Standing - Balance Activities: Reaching for objects Extremity/Trunk Assessment RUE Assessment RUE Assessment: Exceptions to [facility] LUE Assessment LUE Assessment: Within Functional Limits  See Function Navigator for Current Functional Status.   Refer to Care Plan for Long Term Goals  Recommendations for other services: None    Discharge Criteria: Patient will be discharged from OT if patient refuses treatment 3 consecutive times without medical reason, if treatment goals not met, if there is a change in medical status, if patient makes no progress towards goals or if patient is discharged from hospital.  The above assessment, treatment plan, treatment alternatives and goals were discussed and mutually agreed upon: by patient and by family  [name] 02/23/2017, 4:22 PM

## 2017-02-23 NOTE — Evaluation (Signed)
Speech Language Pathology Assessment and Plan  Patient Details  Name: Erik [name] MRN: [id number] Date of Birth: May 10, 1956  SLP Diagnosis: Aphasia;Dysarthria;Cognitive Impairments  Rehab Potential: Good ELOS: 7-10 days     Today's Date: 62/20/2018 SLP Individual Time: 1300-1400 SLP Individual Time Calculation (min): 60 min   Problem List:  Patient Active Problem List   Diagnosis Date Noted  . ICAO (internal carotid artery occlusion), left 02/22/2017  . Acute respiratory failure ([place]) 02/22/2017  . Obesity 02/22/2017  . Expressive aphasia 02/22/2017  . Acute ischemic left MCA stroke ([place]) 02/22/2017  . Left hemiparesis ([place])   . Status post total bilateral knee replacement   . Benign essential HTN   . Right-sided extracranial carotid artery stenosis   . Dysphagia, post-stroke   . Acute blood loss anemia   . Nocturia   . Newly diagnosed diabetes ([place])   . CVA (cerebral vascular accident) ([place]) - L MCA s/p tPA and thrombectomy 02/16/2017  . Anxiety 04/30/2015  . Benign fibroma of prostate 04/30/2015  . Clinical depression 04/30/2015  . Acid reflux 04/30/2015  . HLD (hyperlipidemia) 04/30/2015  . BP (high blood pressure) 04/30/2015  . Arthritis, degenerative 04/30/2015  . 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 Arthritis, septic, knee ([place]) 09/26/2012   Past Medical History:  Past Medical History:  Diagnosis Date  . Anxiety   . Arthritis    hands  . BPH (benign prostatic hyperplasia)   . ED (erectile dysfunction)   . H/O calcium pyrophosphate deposition disease (CPPD)   . HLD (hyperlipidemia)   . Hypogonadism in male   . Lower urinary tract symptoms (LUTS)   . Nocturia   . Testosterone overdose   . Vitamin D deficiency    Past Surgical History:  Past Surgical History:  Procedure Laterality Date  . BACK SURGERY    . IR ANGIO INTRA EXTRACRAN SEL COM CAROTID INNOMINATE UNI R MOD SED  02/16/2017  . IR PERCUTANEOUS ART  THROMBECTOMY/INFUSION INTRACRANIAL INC DIAG ANGIO  02/16/2017  . IR PTA NON CORO-LOWER EXTREM  02/16/2017  . IR US GUIDE VASC ACCESS LEFT  02/16/2017  . IR US GUIDE VASC ACCESS RIGHT  02/16/2017  . KNEE ARTHROSCOPY Bilateral   . RADIOLOGY WITH ANESTHESIA N/A 02/16/2017   Procedure: RADIOLOGY WITH ANESTHESIA;  Surgeon: Luanne Bras, MD;  Location: [place];  Service: Radiology;  Laterality: N/A;  . ROTATOR CUFF REPAIR Right     Assessment / Plan / Recommendation Clinical Impression Patient is a 61 y.o.malewith history of BPH, OA s/p B-TKR , HTN (wife/patient deny) who was admitted on 02/16/17 after being found by wife lying on the ground with inability to speak. History taken from chart review, patient, and wife. CTA head/neck revealed hyperdense L-MCA sign likely indicating acute thrombus. He received tPA and he underwent cerebral angiogram with L-MCA thrombectomy, dense calcified plaque L-ICA precluded stent placement and noted to have 73% stenosis R-ICA due to advanced atherosclerotic changes. MRI/MRA brain reviewed, showing multiple areas of infarct. Per report, multiple areas of acute or early ischemia within left supratentorial hemisphere--largest affecting left basal and temporal lobe with cytotoxic edema, petechial hemorrhage in left posterior temporal and peripheral left basal ganglia. Carotid dopplers with right severe mixed plaque origin and proximal ICA/ECA 80-99%, plan for elective surgery post-rehab. 2D echo with EF 55-60% with no wall abnormality and grade 2 diastolic dysfunction. Patient on ASA/Plavix X 3 months followed by plavix alone and right ICA stent at end of rehab stay. Patient with  resultant facial weakness with mild dysarthria, mild dysphagia, aphasia, delayed processing with slow responses, mild left gaze preference and right sided weakness affecting mobility and ability to carry out ADL tasks. CIR recommended for follow up therapy and patient admitted 02/22/17.  Patient  demonstrates mild cognitive impairments characterized by decreased recall of new information and emergent awareness, especially in regards to decreased speech intelligibility. Patient also demonstrates mild word-finding deficits at the conversation level and moderate decoding deficits at the word level. Patient's functional communication is also exacerbated by moderate dysarthria due to imprecise consonants and increased speech rate. Patient would benefit from skilled SLP intervention to maximize his cognitive-linguistic and function and overall functional independence prior to discharge.    Skilled Therapeutic Interventions          Administered a cognitive-linguistic evaluation. Please see above for details. Patient required Mod A verbal cues for use of speech intelligibility strategies at the phrase and sentence level to achieve ~75% intelligibility during informal conversation. Patient's wife present and educated on strategies to utilize to help patient increase intelligibility, she verbalized understanding.    SLP Assessment  Patient will need skilled [facility] during CIR admission    Recommendations  Oral Care Recommendations: Oral care BID Patient destination: Home Follow up Recommendations: Outpatient SLP (TBD for supervision ) Equipment Recommended: None recommended by SLP    SLP Frequency 3 to 5 out of 7 days   SLP Duration  SLP Intensity  SLP Treatment/Interventions 7-10 days   Minumum of 1-2 x/day, 30 to 90 minutes  Cognitive remediation/compensation;Cueing hierarchy;Environmental controls;Functional tasks;Internal/external aids;Patient/family education;Speech/Language facilitation;Therapeutic Activities    Pain Pain Assessment Pain Assessment: No/denies pain  Prior Functioning Type of Home: House  Lives With: Spouse Available Help at Discharge: Family;Available PRN/intermittently (spouse, brother) Vocation: Full time  employment  Function:  Eating Eating   Modified Consistency Diet: No Eating Assist Level: No help, No cues           Cognition Comprehension Comprehension assist level: Understands complex 90% of the time/cues 10% of the time  Expression   Expression assist level: Expresses basic 75 - 89% of the time/requires cueing 10 - 24% of the time. Needs helper to occlude trach/needs to repeat words.  Social Interaction Social Interaction assist level: Interacts appropriately 90% of the time - Needs monitoring or encouragement for participation or interaction.  Problem Solving Problem solving assist level: Solves complex 90% of the time/cues < 10% of the time  Memory Memory assist level: Recognizes or recalls 75 - 89% of the time/requires cueing 10 - 24% of the time   Short Term Goals: Week 1: SLP Short Term Goal 1 (Week 1): Patient will utilize speech intelligibility strategies at the sentence level to maximzie intelligibility to 90% with supervision verbal cues.  SLP Short Term Goal 2 (Week 1): Patient will self-monitor and correct articulatory errors with supervision verbal cues.  SLP Short Term Goal 3 (Week 1): Patient will decode at the word and phrase level with 75% accuracy and Min A multimodal cues.  SLP Short Term Goal 4 (Week 1): Patient will utilize word-finding strategies at the sentence level with Supervision verbal cues.  SLP Short Term Goal 5 (Week 1): Patient will recall new, daily events with use of external aids with supervision verbal cues.   Refer to Care Plan for Long Term Goals  Recommendations for other services: None   Discharge Criteria: Patient will be discharged from SLP if patient refuses treatment 3 consecutive times without medical  reason, if treatment goals not met, if there is a change in medical status, if patient makes no progress towards goals or if patient is discharged from hospital.  The above assessment, treatment plan, treatment alternatives and goals  were discussed and mutually agreed upon: by patient and by family  Oliveah Zwack 02/23/2017, 3:31 PM

## 2017-02-23 NOTE — IPOC Note (Signed)
Overall Plan of Care [facility]) Patient Details Name: Erik [name] MRN: [id number] DOB: Mar 27, 1956  Admitting Diagnosis: CVA  Hospital Problems: Active Problems:   Acute ischemic left MCA stroke (HCC)   Left hemiparesis (HCC)     Functional Problem List: Nursing Endurance, Medication Management, Motor, Safety, Perception, Bladder, Bowel, Pain, Skin Integrity, Sensory  PT Balance, Endurance, Motor, Perception, Safety  OT Balance, Motor, Cognition, Perception, Safety  SLP Cognition, Linguistic  TR         Basic ADL's: OT Eating, Grooming, Bathing, Toileting, Dressing     Advanced  ADL's: OT Simple Meal Preparation, Light Housekeeping     Transfers: PT Bed Mobility, Bed to Chair, Car, Occupational psychologist, Research scientist (life sciences): PT Ambulation, Stairs     Additional Impairments: OT Fuctional Use of Upper Extremity  SLP Communication, Social Cognition expression Memory, Awareness  TR      Anticipated Outcomes Item Anticipated Outcome  Self Feeding Mod I  Swallowing      Basic self-care  Mod I  Toileting  Mod I   Bathroom Transfers Mod I  Bowel/Bladder  mod I  Transfers  mod I  Locomotion  mod I ambulatory  Communication  Supervision   Cognition  Supervision   Pain  less than 2  Safety/Judgment  mod I   Therapy Plan: PT Intensity: Minimum of 1-2 x/day ,45 to 90 minutes PT Frequency: 5 out of 7 days PT Duration Estimated Length of Stay: 7-10 days OT Intensity: Minimum of 1-2 x/day, 45 to 90 minutes OT Frequency: 5 out of 7 days OT Duration/Estimated Length of Stay: 7-10 days SLP Intensity: Minumum of 1-2 x/day, 30 to 90 minutes SLP Frequency: 3 to 5 out of 7 days SLP Duration/Estimated Length of Stay: 7-10 days        Team Interventions: Nursing Interventions Patient/Family Education, Disease Management/Prevention, Medication Management, Cognitive Remediation/Compensation, Discharge Planning, Psychosocial Support, Bladder Management, Bowel  Management  PT interventions Ambulation/gait training, Community reintegration, DME/adaptive equipment instruction, Neuromuscular re-education, Psychosocial support, Stair training, UE/LE Strength taining/ROM, UE/LE Coordination activities, Therapeutic Activities, Warden/ranger, Functional mobility training, Patient/family education, Splinting/orthotics, Therapeutic Exercise, Visual/perceptual remediation/compensation  OT Interventions Balance/vestibular training, Cognitive remediation/compensation, Discharge planning, DME/adaptive equipment instruction, Functional electrical stimulation, Functional mobility training, Neuromuscular re-education, Patient/family education, Self Care/advanced ADL retraining, Therapeutic Activities, Therapeutic Exercise, UE/LE Strength taining/ROM, UE/LE Coordination activities, Visual/perceptual remediation/compensation  SLP Interventions Cognitive remediation/compensation, Cueing hierarchy, Environmental controls, Functional tasks, Internal/external aids, Patient/family education, Speech/Language facilitation, Therapeutic Activities  TR Interventions    SW/CM Interventions Discharge Planning, Psychosocial Support, Patient/Family Education    Team Discharge Planning: Destination: PT-Home ,OT- Home , SLP-Home Projected Follow-up: PT-Outpatient PT, OT-  Outpatient OT, SLP-Outpatient SLP (TBD for supervision ) Projected Equipment Needs: PT-To be determined, OT- To be determined, SLP-None recommended by SLP Equipment Details: PT- , OT-possibly a tub transfer bench Patient/family involved in discharge planning: PT- Patient, Family member/caregiver,  OT-Patient, Family member/caregiver, SLP-Patient, Family member/caregiver  MD ELOS: 7-10 days Medical Rehab Prognosis:  Excellent Assessment: The patient has been admitted for CIR therapies with the diagnosis of left MCA infarct. The team will be addressing functional mobility, strength, stamina, balance, safety,  adaptive techniques and equipment, self-care, bowel and bladder mgt, patient and caregiver education, NMR, visual-perceptual rx, cognition, communication, swallowing, ego support, language. Goals have been set at mod I for basic mobility and self-care/ADL's and supervision for cognition and communication.    Ranelle Oyster, MD, [facility]      See Team  Conference Notes for weekly updates to the plan of care

## 2017-02-23 NOTE — Progress Notes (Signed)
Subjective/Complaints: No issues overnite, wife in room  ROS-  Denies CP,SOB, N/V/D  Objective: Vital Signs: Blood pressure 131/61, pulse 71, temperature 98.1 F (36.7 C), temperature source Oral, resp. rate 17, height '5\' 11"'  (1.803 m), weight 95.3 kg (210 lb 1.6 oz), SpO2 97 %. No results found. Results for orders placed or performed during the hospital encounter of 02/22/17 (from the past 72 hour(s))  CBC WITH DIFFERENTIAL     Status: Abnormal   Collection Time: 02/22/17  4:30 PM  Result Value Ref Range   WBC 6.1 4.0 - 10.5 K/uL   RBC 4.20 (L) 4.22 - 5.81 MIL/uL   Hemoglobin 13.4 13.0 - 17.0 g/dL   HCT 38.5 (L) 39.0 - 52.0 %   MCV 91.7 78.0 - 100.0 fL   MCH 31.9 26.0 - 34.0 pg   MCHC 34.8 30.0 - 36.0 g/dL   RDW 13.1 11.5 - 15.5 %   Platelets 206 150 - 400 K/uL   Neutrophils Relative % 51 %   Neutro Abs 3.1 1.7 - 7.7 K/uL   Lymphocytes Relative 35 %   Lymphs Abs 2.2 0.7 - 4.0 K/uL   Monocytes Relative 12 %   Monocytes Absolute 0.7 0.1 - 1.0 K/uL   Eosinophils Relative 2 %   Eosinophils Absolute 0.1 0.0 - 0.7 K/uL   Basophils Relative 0 %   Basophils Absolute 0.0 0.0 - 0.1 K/uL  Comprehensive metabolic panel     Status: Abnormal   Collection Time: 02/22/17  4:30 PM  Result Value Ref Range   Sodium 136 135 - 145 mmol/L   Potassium 3.9 3.5 - 5.1 mmol/L   Chloride 100 (L) 101 - 111 mmol/L   CO2 28 22 - 32 mmol/L   Glucose, Bld 104 (H) 65 - 99 mg/dL   BUN 25 (H) 6 - 20 mg/dL   Creatinine, Ser 0.88 0.61 - 1.24 mg/dL   Calcium 9.9 8.9 - 10.3 mg/dL   Total Protein 7.7 6.5 - 8.1 g/dL   Albumin 4.2 3.5 - 5.0 g/dL   AST 36 15 - 41 U/L   ALT 41 17 - 63 U/L   Alkaline Phosphatase 34 (L) 38 - 126 U/L   Total Bilirubin 0.6 0.3 - 1.2 mg/dL   GFR calc non Af Amer >60 >60 mL/min   GFR calc Af Amer >60 >60 mL/min    Comment: (NOTE) The eGFR has been calculated using the CKD EPI equation. This calculation has not been validated in all clinical situations. eGFR's persistently <60  mL/min signify possible Chronic Kidney Disease.    Anion gap 8 5 - 15  Glucose, capillary     Status: Abnormal   Collection Time: 02/22/17  4:46 PM  Result Value Ref Range   Glucose-Capillary 114 (H) 65 - 99 mg/dL  Glucose, capillary     Status: Abnormal   Collection Time: 02/22/17  8:54 PM  Result Value Ref Range   Glucose-Capillary 151 (H) 65 - 99 mg/dL  Glucose, capillary     Status: Abnormal   Collection Time: 02/23/17  6:44 AM  Result Value Ref Range   Glucose-Capillary 106 (H) 65 - 99 mg/dL     HEENT: normal Cardio: RRR and no murmur Resp: CTA B/L and unlabored GI: BS positive and NT, ND Extremity:  No Edema Skin:   Intact Neuro: Cranial Nerve Abnormalities R central VII, Normal Sensory, Abnormal Motor 4/5 in RUE adn RLE, 5/5 on left side, Abnormal FMC Ataxic/ dec FMC, Dysarthric and Aphasic Musc/Skel:  Normal Gen NAD   Assessment/Plan: 1. Functional deficits secondary to Left MCA infarct which require 3+ hours per day of interdisciplinary therapy in a comprehensive inpatient rehab setting. Physiatrist is providing close team supervision and 24 hour management of active medical problems listed below. Physiatrist and rehab team continue to assess barriers to discharge/monitor patient progress toward functional and medical goals. FIM:                   Function - Comprehension Comprehension: Auditory Comprehension assist level: Understands complex 90% of the time/cues 10% of the time  Function - Expression Expression: Verbal Expression assist level: Expresses complex 90% of the time/cues < 10% of the time  Function - Social Interaction Social Interaction assist level: Interacts appropriately 90% of the time - Needs monitoring or encouragement for participation or interaction.  Function - Problem Solving Problem solving assist level: Solves complex 90% of the time/cues < 10% of the time  Function - Memory Memory assist level: More than reasonable amount  of time Patient normally able to recall (first 3 days only): Staff names and faces, That he or she is in a hospital, Current season, Location of own room  Medical Problem List and Plan: 1.  Right hemiparesis and aphasia  secondary to L MCA infarct Start CIR PT,OT,SLP 2.  DVT Prophylaxis/Anticoagulation: Pharmaceutical: Lovenox 3. Pain Management: N/A 4. Mood: On Prozac. LCSW to follow for evaluation and support.  5. Neuropsych: This patient is capable of making decisions on his own behalf. 6. Skin/Wound Care: routine pressure relief measures.  7. Fluids/Electrolytes/Nutrition: Monitor I/O. 8. DVV:OHYWVPX BP bid. Currently well controlled. Will monitor to monitor on Norvasc, HCTZ and Cozaar. Encourage compliance.controlled 4/20  Vitals:   02/22/17 2015 02/23/17 0331  BP: 125/64 131/61  Pulse: 64 71  Resp:  17  Temp:  98.1 F (36.7 C)   9. Dyslipidemia: Continue lipitor.  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  11. T2DM: New diagnosis with Hgb A1C 7.0. Monitor BS ac/hs and use SSI for elevated BS. Will start patient on low dose metformin and titrate upwards as indicated. Well controlled 4/20 CBG (last 3)   Recent Labs  02/22/17 1646 02/22/17 2054 02/23/17 0644  GLUCAP 114* 151* 106*     12. Anemia: Continue to monitor. LOS (Days) 1 A FACE TO FACE EVALUATION WAS PERFORMED  KIRSTEINS,ANDREW E 02/23/2017, 8:51 AM

## 2017-02-23 NOTE — Care Management Note (Signed)
[facility]patient Rehabilitation [facility] Individual Statement of Services  Patient Name:  Erik [name]  Date:  02/23/2017  Welcome to the Inpatient Rehabilitation Center.  Our goal is to provide you with an individualized program based on your diagnosis and situation, designed to meet your specific needs.  With this comprehensive rehabilitation program, you will be expected to participate in at least 3 hours of rehabilitation therapies Monday-Friday, with modified therapy programming on the weekends.  Your rehabilitation program will include the following services:  Physical Therapy (PT), Occupational Therapy (OT), Speech Therapy (ST), 24 hour per day rehabilitation nursing, Therapeutic Recreaction (TR), Neuropsychology, Case Management (Social Worker), Rehabilitation Medicine, Nutrition Services and Pharmacy Services  Weekly team conferences will be held on Wednesday to discuss your progress.  Your Social Worker will talk with you frequently to get your input and to update you on team discussions.  Team conferences with you and your family in attendance may also be held.  Expected length of stay: 7-10 Days  Overall anticipated outcome: mod/I level  Depending on your progress and recovery, your program may change. Your Social Worker will coordinate services and will keep you informed of any changes. Your Social Worker's name and contact numbers are listed  below.  The following services may also be recommended but are not provided by the Inpatient Rehabilitation Center:   Driving Evaluations  Home Health Rehabiltiation Services  Outpatient Rehabilitation Services  Vocational Rehabilitation   Arrangements will be made to provide these services after discharge if needed.  Arrangements include referral to agencies that provide these services.  Your insurance has been verified to be:  [place] Your primary doctor is:  Clydie Braun  Pertinent information will be shared with your doctor and your  insurance company.  Social Worker:  Dossie Der, SW [phone] or ([phone]  Information discussed with and copy given to patient by: Lucy Chris, 02/23/2017, 9:37 AM

## 2017-02-23 NOTE — Evaluation (Signed)
Physical Therapy Assessment and Plan  Patient Details  Name: Erik [name] MRN: [id number] Date of Birth: 11/17/1955  PT Diagnosis: Abnormality of gait, Coordination disorder, Hemiparesis dominant and Muscle weakness Rehab Potential: Excellent ELOS: 7-10 days   Today's Date: 02/23/2017 PT Individual Time: 1445-1600 PT Individual Time Calculation (min): 75 min    Problem List:  Patient Active Problem List   Diagnosis Date Noted  . ICAO (internal carotid artery occlusion), left 02/22/2017  . Acute respiratory failure (HCC) 02/22/2017  . Obesity 02/22/2017  . Expressive aphasia 02/22/2017  . Acute ischemic left MCA stroke (HCC) 02/22/2017  . Left hemiparesis (HCC)   . Status post total bilateral knee replacement   . Benign essential HTN   . Right-sided extracranial carotid artery stenosis   . Dysphagia, post-stroke   . Acute blood loss anemia   . Nocturia   . Newly diagnosed diabetes (HCC)   . CVA (cerebral vascular accident) (HCC) - L MCA s/p tPA and thrombectomy 02/16/2017  . Anxiety 04/30/2015  . Benign fibroma of prostate 04/30/2015  . Clinical depression 04/30/2015  . Acid reflux 04/30/2015  . HLD (hyperlipidemia) 04/30/2015  . BP (high blood pressure) 04/30/2015  . Arthritis, degenerative 04/30/2015  . 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 Arthritis, septic, knee (HCC) 09/26/2012    Past Medical History:  Past Medical History:  Diagnosis Date  . Anxiety   . Arthritis    hands  . BPH (benign prostatic hyperplasia)   . ED (erectile dysfunction)   . H/O calcium pyrophosphate deposition disease (CPPD)   . HLD (hyperlipidemia)   . Hypogonadism in male   . Lower urinary tract symptoms (LUTS)   . Nocturia   . Testosterone overdose   . Vitamin D deficiency    Past Surgical History:  Past Surgical History:  Procedure Laterality Date  . BACK SURGERY    . IR ANGIO INTRA EXTRACRAN SEL COM CAROTID INNOMINATE UNI R MOD SED  02/16/2017  . IR  PERCUTANEOUS ART THROMBECTOMY/INFUSION INTRACRANIAL INC DIAG ANGIO  02/16/2017  . IR PTA NON CORO-LOWER EXTREM  02/16/2017  . IR US GUIDE VASC ACCESS LEFT  02/16/2017  . IR US GUIDE VASC ACCESS RIGHT  02/16/2017  . KNEE ARTHROSCOPY Bilateral   . RADIOLOGY WITH ANESTHESIA N/A 02/16/2017   Procedure: RADIOLOGY WITH ANESTHESIA;  Surgeon: Sanjeev Deveshwar, MD;  Location: [facility] OR;  Service: Radiology;  Laterality: N/A;  . ROTATOR CUFF REPAIR Right     Assessment & Plan Clinical Impression: A 61 y.o. male with history of BPH, OA s/p B-TKR , HTN (wife/patient deny) who was admitted on 02/16/17 after being found by wife lying on the ground with inability to speak. History taken from chart review, patient, and wife. CTA head/neck revealed hyperdense L-MCA sign likely indicating acute thrombus. He received tPA  and he underwent cerebral angiogram with L-MCA thrombectomy, dense calcified plaque L-ICA precluded stent placement and noted to have 73% stenosis R-ICA due to advanced atherosclerotic changes. MRI/MRA brain reviewed, showing multiple areas of infarct. Per report, multiple areas of acute or early ischemia within left supratentorial hemisphere--largest affecting left basal and temporal lobe with cytotoxic edema, petechial hemorrhage in left posterior temporal and peripheral left basal ganglia. Carotid dopplers with right severe mixed plaque origin and proximal ICA/ECA 80-99%, plan for elective surgery post-rehab.  2D echo with EF 55-60% with no wall abnormality and grade 2 diastolic dysfunction. Patient on ASA/Plavix X 3 months followed by plavix alone and right ICA stent at   end of rehab stay.    He declined participating in stroke AF study. Patient with resultant facial weakness with mild dysarthria, mild dysphagia, aphasia, delayed processing with slow responses, mild left gaze preference and right sided weakness affecting mobility and ability to carry out ADL tasks. CIR recommended for follow up therpay.    Patient transferred to CIR on 02/22/2017 .   Patient currently requires min with mobility secondary to muscle weakness, unbalanced muscle activation and decreased coordination, decreased attention to right and decreased standing balance, decreased postural control, hemiplegia and decreased balance strategies.  Prior to hospitalization, patient was independent  with mobility and lived with Spouse in a House home.  Home access is 4-5Stairs to enter.  Patient will benefit from skilled PT intervention to maximize safe functional mobility and minimize fall risk for planned discharge home with 24 hour supervision.  Anticipate patient will benefit from follow up OP at discharge.  PT - End of Session Activity Tolerance: Tolerates 30+ min activity with multiple rests Endurance Deficit: Yes Endurance Deficit Description: occasional rest breaks due to RLE fatigue PT Assessment Rehab Potential (ACUTE/IP ONLY): Excellent Barriers to Discharge: Decreased caregiver support (after 1 week) PT Patient demonstrates impairments in the following area(s): Balance;Endurance;Motor;Perception;Safety PT Transfers Functional Problem(s): Bed Mobility;Bed to Chair;Car;Furniture PT Locomotion Functional Problem(s): Ambulation;Stairs PT Plan PT Intensity: Minimum of 1-2 x/day ,45 to 90 minutes PT Frequency: 5 out of 7 days PT Duration Estimated Length of Stay: 7-10 days PT Treatment/Interventions: Ambulation/gait training;Community reintegration;DME/adaptive equipment instruction;Neuromuscular re-education;Psychosocial support;Stair training;UE/LE Strength taining/ROM;UE/LE Coordination activities;Therapeutic Activities;Balance/vestibular training;Functional mobility training;Patient/family education;Splinting/orthotics;Therapeutic Exercise;Visual/perceptual remediation/compensation PT Transfers Anticipated Outcome(s): mod I PT Locomotion Anticipated Outcome(s): mod I ambulatory PT Recommendation Recommendations for  Other Services: Neuropsych consult;Therapeutic Recreation consult Therapeutic Recreation Interventions: Pet therapy Follow Up Recommendations: Outpatient PT Patient destination: Home Equipment Recommended: To be determined  Skilled Therapeutic Intervention No c/o pain.  Session focus on initial PT assessment (see below), pt and family education with wife (Wendy), balance, activity tolerance, and gait with LRAD.  PT instructed pt in transfers with RW and with SBQC with steady assist and min verbal cues for squaring up to surface and reaching back before sitting.  Gait training x200' with RW and supervision, 150'+300' with SBQC and min guard with occasional LOB 2/2 poor R foot clearance requiring up to min assist to recover.  Pt's R foot clearance worsening with fatigue throughout session.  Discussed with pt and wife ELOS, expected progress, and goals of therapy.  Educated wife on performing ambulatory transfers to bathroom with SBQC and signed off on safety plan.  Pt returned to room at end of session and positioned in recliner with wife present, call bell in reach and needs met.   PT Evaluation Precautions/Restrictions Precautions Precautions: Fall Precaution Comments: mild R inattention during functional tasks Restrictions Weight Bearing Restrictions: No Pain Pain Assessment Pain Assessment: No/denies pain Home Living/Prior Functioning Home Living Available Help at Discharge: Family;Available PRN/intermittently (spouse, brother) Type of Home: House Home Access: Stairs to enter Entrance Stairs-Number of Steps: 4-5 Entrance Stairs-Rails: Right;Left Home Layout: Two level;Bed/bath upstairs;Full bath on main level Alternate Level Stairs-Number of Steps: flight Alternate Level Stairs-Rails: Right;Left;Can reach both Bathroom Shower/Tub: Walk-in shower;Other (comment);Door Bathroom Toilet: Handicapped height  Lives With: Spouse Prior Function Level of Independence: Independent with  gait;Independent with transfers  Able to Take Stairs?: Yes Driving: Yes Vocation: Full time employment Vocation Requirements: works in shipping/receiving, 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#, computer work, even split sitting/standing Comments: drives, works full time in a warehouse Vision/Perception    Vision - History Baseline Vision: Wears glasses only for reading Patient Visual Report: No change from baseline Vision - Assessment Additional Comments: to be further assessed Perception Perception: Impaired Inattention/Neglect: Does not attend to right visual field Praxis Praxis: Intact  Cognition Overall Cognitive Status: Impaired/Different from baseline Arousal/Alertness: Awake/alert Orientation Level: Oriented X4 Attention: Sustained Sustained Attention: Appears intact Memory: Impaired Memory Impairment: Decreased recall of new information;Decreased short term memory Awareness: Appears intact Awareness Impairment: Emergent impairment Problem Solving: Appears intact Safety/Judgment: Appears intact Sensation Sensation Light Touch: Appears Intact (LEs) Proprioception: Appears Intact Coordination Gross Motor Movements are Fluid and Coordinated: Yes Fine Motor Movements are Fluid and Coordinated: No (mild impairment on R) Finger Nose Finger Test: Dysmetria  Motor  Motor Motor: Within Functional Limits  Mobility Transfers Transfers: Yes Sit to Stand: 5: Supervision Stand to Sit: 5: Supervision Locomotion  Ambulation Ambulation: Yes Ambulation/Gait Assistance: 4: Min guard Ambulation Distance (Feet): 300 Feet Assistive device: Rolling walker;Small based quad cane Gait Gait: Yes Gait Pattern: Step-to pattern;Decreased step length - right;Decreased stride length;Decreased hip/knee flexion - right;Decreased weight shift to left;Poor foot clearance - right Stairs / Additional Locomotion Stairs: Yes Stairs Assistance: 4: Min assist Stair Management Technique: Two rails Number of Stairs:  12 Ramp: 4: Min assist Wheelchair Mobility Wheelchair Mobility: No  Trunk/Postural Assessment  Cervical Assessment Cervical Assessment: Within Functional Limits Thoracic Assessment Thoracic Assessment: Within Functional Limits Lumbar Assessment Lumbar Assessment: Within Functional Limits  Balance Balance Balance Assessed: Yes Standardized Balance Assessment Standardized Balance Assessment: Berg Balance Test Berg Balance Test Sit to Stand: Able to stand without using hands and stabilize independently Standing Unsupported: Able to stand safely 2 minutes Sitting with Back Unsupported but Feet Supported on Floor or Stool: Able to sit safely and securely 2 minutes Stand to Sit: Sits safely with minimal use of hands Transfers: Able to transfer safely, minor use of hands Standing Unsupported with Eyes Closed: Able to stand 10 seconds with supervision Standing Ubsupported with Feet Together: Needs help to attain position but able to stand for 30 seconds with feet together From Standing, Reach Forward with Outstretched Arm: Can reach forward >12 cm safely (5") From Standing Position, Pick up Object from Floor: Able to pick up shoe, needs supervision From Standing Position, Turn to Look Behind Over each Shoulder: Looks behind from both sides and weight shifts well Turn 360 Degrees: Needs close supervision or verbal cueing Standing Unsupported, Alternately Place Feet on Step/Stool: Able to complete >2 steps/needs minimal assist Standing Unsupported, One Foot in Front: Able to take small step independently and hold 30 seconds Standing on One Leg: Able to lift leg independently and hold equal to or more than 3 seconds Total Score: 40 Dynamic Sitting Balance Sitting balance - Comments: leans posteriorly with crossin his legs to don socks Extremity Assessment  RUE Assessment RUE Assessment: Exceptions to WFL LUE Assessment LUE Assessment: Within Functional Limits RLE Assessment RLE  Assessment: Within Functional Limits LLE Assessment LLE Assessment: Within Functional Limits   See Function Navigator for Current Functional Status.   Refer to Care Plan for Long Term Goals  Recommendations for other services: Neuropsych and Therapeutic Recreation  Pet therapy  Discharge Criteria: Patient will be discharged from PT if patient refuses treatment 3 consecutive times without medical reason, if treatment goals not met, if there is a change in medical status, if patient makes no progress towards goals or if patient is discharged from hospital.  The above assessment, treatment plan, treatment alternatives and goals were discussed   and mutually agreed upon: by patient  Earnest Conroy Penven-Crew 02/23/2017, 4:16 PM

## 2017-02-23 NOTE — Progress Notes (Signed)
Social Work Assessment and Plan Social Work Assessment and Plan  Patient Details  Name: Erik [name] MRN: [id number] Date of Birth: 1956/05/09  Today's Date: 02/23/2017  Problem List:  Patient Active Problem List   Diagnosis Date Noted  . ICAO (internal carotid artery occlusion), left 02/22/2017  . Acute respiratory failure (HCC) 02/22/2017  . Obesity 02/22/2017  . Expressive aphasia 02/22/2017  . Acute ischemic left MCA stroke (HCC) 02/22/2017  . Left hemiparesis (HCC)   . Status post total bilateral knee replacement   . Benign essential HTN   . Right-sided extracranial carotid artery stenosis   . Dysphagia, post-stroke   . Acute blood loss anemia   . Nocturia   . Newly diagnosed diabetes (HCC)   . CVA (cerebral vascular accident) (HCC) - L MCA s/p tPA and thrombectomy 02/16/2017  . Anxiety 04/30/2015  . Benign fibroma of prostate 04/30/2015  . Clinical depression 04/30/2015  . Acid reflux 04/30/2015  . HLD (hyperlipidemia) 04/30/2015  . BP (high blood pressure) 04/30/2015  . Arthritis, degenerative 04/30/2015  . 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 Arthritis, septic, knee (HCC) 09/26/2012   Past Medical History:  Past Medical History:  Diagnosis Date  . Anxiety   . Arthritis    hands  . BPH (benign prostatic hyperplasia)   . ED (erectile dysfunction)   . H/O calcium pyrophosphate deposition disease (CPPD)   . HLD (hyperlipidemia)   . Hypogonadism in male   . Lower urinary tract symptoms (LUTS)   . Nocturia   . Testosterone overdose   . Vitamin D deficiency    Past Surgical History:  Past Surgical History:  Procedure Laterality Date  . BACK SURGERY    . IR ANGIO INTRA EXTRACRAN SEL COM CAROTID INNOMINATE UNI R MOD SED  02/16/2017  . IR PERCUTANEOUS ART THROMBECTOMY/INFUSION INTRACRANIAL INC DIAG ANGIO  02/16/2017  . IR PTA NON CORO-LOWER EXTREM  02/16/2017  . IR US GUIDE VASC ACCESS LEFT  02/16/2017  . IR US GUIDE VASC ACCESS RIGHT   02/16/2017  . KNEE ARTHROSCOPY Bilateral   . RADIOLOGY WITH ANESTHESIA N/A 02/16/2017   Procedure: RADIOLOGY WITH ANESTHESIA;  Surgeon: Julieanne Cotton, MD;  Location: [facility] OR;  Service: Radiology;  Laterality: N/A;  . ROTATOR CUFF REPAIR Right    Social History:  reports that he has never smoked. He has never used smokeless tobacco. He reports that he drinks alcohol. He reports that he does not use drugs.  Family / Support Systems Marital Status: Married Patient Roles: Spouse, Parent, Other (Comment) (employee) Spouse/Significant Other: Toniann Fail [phone] Children: two grown children one lives with them-mental disability Other Supports: Brother in [place] Anticipated Caregiver: Wife and brother if 24 hr care is needed. Ability/Limitations of Caregiver: Wife works so brother to be there if needed for 24 hr care Caregiver Availability: 24/7 Family Dynamics: Close knit family two daughter's are involved and will assist. Pt's brother is retired and can offer assist. They have numerous friends and church members who are supportive.  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   Abuse/Neglect Physical Abuse: Denies Verbal Abuse: Denies Sexual Abuse: Denies Exploitation of patient/patient's resources:  Denies Self-Neglect: Denies Possible abuse reported to:: Other (Comment)  Emotional Status Pt's affect, behavior adn adjustment status: Pt is motivated and encouraged by the progress in his arm and leg he has made already. His speech is his main issue he feels. His wife is here  today and supportive. He hopes to be able to do for himself when he leaves here. Recent Psychosocial Issues: other health issues were managed Pyschiatric History: history of anxiety takes medication for this which he finds helpful. This worker feels he would benefit from seeing neuro-psych while here, this has set him back. Both he and wife are still adjusting to the news, but are pleased with his progress,. Substance Abuse History: ETOH social pt and wife feel, no other issues  Patient / Family Perceptions, Expectations & Goals Pt/Family understanding of illness & functional limitations: Pt and wife are able to explain his stroke and deficits. They are glad he got treatment quickly and feel the TPA helped him to begin his recovery. Both talk with the MD and feel their questions and concerns are being addressed. Premorbid pt/family roles/activities: Husband, father, employee, church member, friend, co-worker, etc Anticipated changes in roles/activities/participation: resume Pt/family expectations/goals: Pt states: " I want to get back to my independent level."  Wife states: " We are hopeful and will plan for the best outcome."  [employer]: None Premorbid Home Care/DME Agencies: None Transportation available at discharge: Wife and daughter Resource referrals recommended: Neuropsychology, Support group (specify)  Discharge Planning Living Arrangements: Spouse/significant other, Children Support Systems: Spouse/significant other, Children, Other relatives, Friends/neighbors, Psychologist, clinical community Type of Residence: Private residence Insurance Resources: Media planner (specify) Counselling psychologist) Financial Resources: Employment, Garment/textile technologist Screen Referred: No Living Expenses: Lives with family Money Management: Patient, Spouse Does the patient have any problems obtaining your medications?: No Home Management: Wife and pt-does outside work Patient/Family  Preliminary Plans: Return home with wife and daughter, will have brother come to stay if need 24 hr care at discharge. Will await team evaluations and work on discharge plans. Pt does have 24 hr care if needed. Paperwork being completed for pt's work. Social Work Anticipated Follow Up Needs: HH/OP, Support Group  Clinical Impression Pleasant couple who are ready for rehab. Pt wants to become as independent as possible before going home he has always ben and doesn't like to ask for assistance from others. Wife is supportive and involved and will assist. They have a daughter in the home but works during the day. Feel pt would benefit from seeing neuro-psych while here for coping. Work on discharge needs.  Lucy Chris 02/23/2017, 9:53 AM

## 2017-02-24 ENCOUNTER — Inpatient Hospital Stay (HOSPITAL_COMMUNITY): Payer: Managed Care, Other (non HMO) | Admitting: Physical Therapy

## 2017-02-24 ENCOUNTER — Inpatient Hospital Stay (HOSPITAL_COMMUNITY): Payer: Managed Care, Other (non HMO) | Admitting: Occupational Therapy

## 2017-02-24 ENCOUNTER — Inpatient Hospital Stay (HOSPITAL_COMMUNITY): Payer: Managed Care, Other (non HMO) | Admitting: Speech Pathology

## 2017-02-24 DIAGNOSIS — I10 Essential (primary) hypertension: Secondary | ICD-10-CM

## 2017-02-24 DIAGNOSIS — E119 Type 2 diabetes mellitus without complications: Secondary | ICD-10-CM

## 2017-02-24 LAB — GLUCOSE, CAPILLARY
GLUCOSE-CAPILLARY: 124 mg/dL — AB (ref 65–99)
Glucose-Capillary: 160 mg/dL — ABNORMAL HIGH (ref 65–99)
Glucose-Capillary: 115 mg/dL — ABNORMAL HIGH (ref 65–99)
Glucose-Capillary: 129 mg/dL — ABNORMAL HIGH (ref 65–99)

## 2017-02-24 NOTE — Progress Notes (Signed)
Occupational Therapy Session Note  Patient Details  Name: Erik [name] MRN: [id number] Date of Birth: August 21, 1956  Today's Date: 02/24/2017 OT Individual Time: 0900-1000 OT Individual Time Calculation (min): 60 min    Short Term Goals: Week 1:  OT Short Term Goal 1 (Week 1): LTG=STG 2/2 estimated LOS  Skilled Therapeutic Interventions/Progress Updates:    OT treatment session focused on standing balance, functional ambulation, forced use of R UE, R FMC, and R side attention. Pt ambulated to sink using 4 point cane with min guard A. B UE coordination w/ toothbrushing task. Pt able to open containers, and apply toothpaste to toothbrush in R UE, min cues to locate items on R side of sink. Pt then ambulated to the gym with min guard A and verbal cues for R foot clearance. Administered 9 hole peg teste with results as detailed below. [facility] focused on in-hand manipulation, translation, rotation, and functional pincer grasp using graded peg board activity. Visual scanning and object naming using animal finding activity. Min cues to scan to locate animals on R side. Pt ambulated back to room at end of session w min guard A, incorporating scanning to R and reading room numbers. Pt left seated in recliner with needs met.  9 Hole Peg Test R hand- 90 seconds L hand- 18 seconds  Therapy Documentation Precautions:  Precautions Precautions: Fall Precaution Comments: mild R inattention during functional tasks Restrictions Weight Bearing Restrictions: No Pain: Pain Assessment Pain Assessment: No/denies pain ADL: ADL ADL Comments: Please see functional navigator  See Function Navigator for Current Functional Status.   Therapy/Group: Individual Therapy  [name] 02/24/2017, 9:21 AM

## 2017-02-24 NOTE — Progress Notes (Signed)
Speech Language Pathology Daily Session Note  Patient Details  Name: Erik [name] MRN: [id number] Date of Birth: 05-01-1956  Today's Date: 02/24/2017 SLP Individual Time: 4098-1191 SLP Individual Time Calculation (min): 45 min  Short Term Goals: Week 1: SLP Short Term Goal 1 (Week 1): Patient will utilize speech intelligibility strategies at the sentence level to maximzie intelligibility to 90% with supervision verbal cues.  SLP Short Term Goal 2 (Week 1): Patient will self-monitor and correct articulatory errors with supervision verbal cues.  SLP Short Term Goal 3 (Week 1): Patient will decode at the word and phrase level with 75% accuracy and Min A multimodal cues.  SLP Short Term Goal 4 (Week 1): Patient will utilize word-finding strategies at the sentence level with Supervision verbal cues.  SLP Short Term Goal 5 (Week 1): Patient will recall new, daily events with use of external aids with supervision verbal cues.   Skilled Therapeutic Interventions: Skilled treatment session focused on language goals. SLP facilitated session by providing Max A verbal cues for utilization of compensatory strategies and to self-monitor and correct errors during a decoding task at the word and phrase level. Patient also required Min-Mod A verbal cues for use of a slow rate and to self-monitor and correct articulatory errors at the phrase and sentence level to achieve ~90% intelligibility. Patient left upright in recliner with all needs within reach. Continue with current plan of care.      Function:  Cognition Comprehension Comprehension assist level: Understands complex 90% of the time/cues 10% of the time  Expression   Expression assist level: Expresses basic 75 - 89% of the time/requires cueing 10 - 24% of the time. Needs helper to occlude trach/needs to repeat words.  Social Interaction Social Interaction assist level: Interacts appropriately 90% of the time - Needs monitoring or encouragement for  participation or interaction.  Problem Solving Problem solving assist level: Solves complex 90% of the time/cues < 10% of the time  Memory Memory assist level: Recognizes or recalls 75 - 89% of the time/requires cueing 10 - 24% of the time    Pain Pain Assessment Pain Assessment: No/denies pain  Therapy/Group: Individual Therapy  Jamisyn Langer 02/24/2017, 12:50 PM

## 2017-02-24 NOTE — Progress Notes (Signed)
Patient ID: Erik [name], male   DOB: Jul 01, 1956, 61 y.o.   MRN: [id number]   02/24/17.  Erik [name] is a 61 y.o. male who was admitted for CIR with Right hemiparesis and aphasia secondary to L MCA infarct.  Past Medical History:  Diagnosis Date  . Anxiety   . Arthritis    hands  . BPH (benign prostatic hyperplasia)   . ED (erectile dysfunction)   . H/O calcium pyrophosphate deposition disease (CPPD)   . HLD (hyperlipidemia)   . Hypogonadism in male   . Lower urinary tract symptoms (LUTS)   . Nocturia   . Testosterone overdose   . Vitamin D deficiency      Subjective: No new complaints. No new problems.  Comfortable night without concerns  Objective: Vital signs in last 24 hours: Temp:  [97.9 F (36.6 C)-98 F (36.7 C)] 98 F (36.7 C) (04/21 0457) Pulse Rate:  [69-72] 69 (04/21 0457) Resp:  [18] 18 (04/21 0457) BP: (131-136)/(57-59) 136/57 (04/21 0457) SpO2:  [97 %-99 %] 99 % (04/21 0457) Weight:  [212 lb 15.4 oz (96.6 kg)] 212 lb 15.4 oz (96.6 kg) (04/21 0457) Weight change: 2 lb 13.9 oz (1.3 kg) Last BM Date: 02/21/17  Intake/Output from previous day: 04/20 0701 - 04/21 0700 In: 840 [P.O.:840] Out: 400 [Urine:400] Last cbgs: CBG (last 3)   Recent Labs  02/23/17 1654 02/23/17 2056 02/24/17 0651  GLUCAP 151* 164* 124*     Physical Exam General: No apparent distress   HEENT: not dry; right central facial Lungs: Normal effort. Lungs clear to auscultation, no crackles or wheezes. Cardiovascular: Regular rate and rhythm, no edema Abdomen: S/NT/ND; BS(+) Musculoskeletal:  unchanged Neurological: Right central facial; mild right hemiparesis Wounds: N/A    Skin: clear   Mental state: Alert, oriented, cooperative  BP Readings from Last 3 Encounters:  02/24/17 (!) 146/54  02/22/17 124/66  04/30/15 139/74   Lab Results  Component Value Date   HGBA1C 7.0 (H) 02/16/2017  Lab Results: BMET    Component Value Date/Time   NA 136 02/22/2017 1630   NA 130 (L) 11/04/2013 0434   K 3.9 02/22/2017 1630   K 3.8 11/04/2013 0434   CL 100 (L) 02/22/2017 1630   CL 98 11/04/2013 0434   CO2 28 02/22/2017 1630   CO2 27 11/04/2013 0434   GLUCOSE 104 (H) 02/22/2017 1630   GLUCOSE 179 (H) 11/04/2013 0434   BUN 25 (H) 02/22/2017 1630   BUN 15 11/04/2013 0434   CREATININE 0.88 02/22/2017 1630   CREATININE 0.81 11/04/2013 0434   CALCIUM 9.9 02/22/2017 1630   CALCIUM 9.0 11/04/2013 0434   GFRNONAA >60 02/22/2017 1630   GFRNONAA >60 11/04/2013 0434   GFRAA >60 02/22/2017 1630   GFRAA >60 11/04/2013 0434   CBC    Component Value Date/Time   WBC 6.1 02/22/2017 1630   RBC 4.20 (L) 02/22/2017 1630   HGB 13.4 02/22/2017 1630   HGB 14.2 11/04/2013 0434   HCT 38.5 (L) 02/22/2017 1630   HCT 41.0 11/04/2013 0434   PLT 206 02/22/2017 1630   PLT 182 11/04/2013 0434   MCV 91.7 02/22/2017 1630   MCV 93 11/04/2013 0434   MCH 31.9 02/22/2017 1630   MCHC 34.8 02/22/2017 1630   RDW 13.1 02/22/2017 1630   RDW 12.5 11/04/2013 0434   LYMPHSABS 2.2 02/22/2017 1630   LYMPHSABS 0.8 (L) 11/04/2013 0434   MONOABS 0.7 02/22/2017 1630   MONOABS 1.0 11/04/2013 0434   EOSABS  0.1 02/22/2017 1630   EOSABS 0.0 11/04/2013 0434   BASOSABS 0.0 02/22/2017 1630   BASOSABS 0.0 11/04/2013 0434   BASOSABS 0 09/15/2012 1610    Medications: I have reviewed the patient's current medications.  Assessment/Plan:  Status post left MCA infarct with right hemiparesis DVD prophylaxis.  Continue Lovenox Hypertension.  Well-controlled.  We'll continue present regimen Diabetes mellitus type 2.  Continue metformin and sliding scale insulin coverage as needed   Length of stay, days: 2  Erik [name] , MD 02/24/2017, 8:57 AM

## 2017-02-24 NOTE — Progress Notes (Signed)
Physical Therapy Session Note  Patient Details  Name: Erik [name] MRN: [id number] Date of Birth: 1955/11/29  Today's Date: 02/24/2017 PT Individual Time: 1450-1540 PT Individual Time Calculation (min): 50 min   Short Term Goals: Week 1:  PT Short Term Goal 1 (Week 1): =LTGs due to ELOS  Skilled Therapeutic Interventions/Progress Updates: Pt received standing in room with handoff from previous therapist. Gait to gym with min guard overall with quad cane, including forward walking, and R/L side stepping for hip abduction strengthening and coordination. Static standing balance in parallel bars including narrow BOS eyes open/closed, tandem stance alternating lead LE, airex pad compliant surface with normal, narrow, and staggered BOS while engaged in word finding cognitive dual task of naming foods for each letter of the alphabet. Seated pipe tree activity with encouragement to use RUE as dominant hand; requires increased time for activity. Returned to room with gait min guard overall, one LOB to R side when therapist cueing pt in scapular retraction to improve postural alignment. Educated pt on effect of cognitive/physical dual task on balance and gait. Remained seated in recliner at end of session with family present, all needs in reach.      Therapy Documentation Precautions:  Precautions Precautions: Fall Precaution Comments: mild R inattention during functional tasks Restrictions Weight Bearing Restrictions: No Pain: Pain Assessment Pain Assessment: No/denies pain   See Function Navigator for Current Functional Status.   Therapy/Group: Individual Therapy  Vista Lawman 02/24/2017, 3:33 PM

## 2017-02-24 NOTE — Progress Notes (Signed)
Physical Therapy Session Note  Patient Details  Name: Erik [name] MRN: [id number] Date of Birth: Apr 28, 1956  Today's Date: 02/24/2017 PT Individual Time: 1405-1450 PT Individual Time Calculation (min): 45 min    Skilled Therapeutic Interventions/Progress Updates:    No pain reported today.  Pt found sitting up in recliner today.  Pt ambulated 150 ft with quad cane x 2 with CGA and verbal cues to increase step length.  Pt shows improved R foot clearance with cues to increase step length.  With fatigue, initial contact with R foot deteriorates, however, pt is able to correct with cueing.  Additionally focused on improving b/l weight shifting with hip flexion to lap tray (external cue) in standing.  Pt also performed stepping over tandem cones as well.  Session ended by working on walking to doorway and back and problem solving turns.  Pt continues to be slightly impulsive with returning to sit and while turning around during ambulation.  Pt left in care of next therapist following session.  Therapy Documentation Precautions:  Precautions Precautions: Fall Precaution Comments: mild R inattention during functional tasks Restrictions Weight Bearing Restrictions: No        See Function Navigator for Current Functional Status.   Therapy/Group: Individual Therapy  [name] 02/24/2017, 3:42 PM

## 2017-02-25 LAB — GLUCOSE, CAPILLARY
GLUCOSE-CAPILLARY: 109 mg/dL — AB (ref 65–99)
GLUCOSE-CAPILLARY: 155 mg/dL — AB (ref 65–99)
Glucose-Capillary: 115 mg/dL — ABNORMAL HIGH (ref 65–99)
Glucose-Capillary: 143 mg/dL — ABNORMAL HIGH (ref 65–99)

## 2017-02-25 NOTE — Progress Notes (Signed)
Patient ID: Erik [name], male   DOB: June 15, 1956, 61 y.o.   MRN: [id number]   02/25/17.  Erik [name] is a 61 y.o. male who is status post left MCA infarct with functional deficits of aphasia and right hemiparesis  Subjective: No new complaints.  Slept well last night and feels quite well this morning  Past Medical History:  Diagnosis Date  . Anxiety   . Arthritis    hands  . BPH (benign prostatic hyperplasia)   . ED (erectile dysfunction)   . H/O calcium pyrophosphate deposition disease (CPPD)   . HLD (hyperlipidemia)   . Hypogonadism in male   . Lower urinary tract symptoms (LUTS)   . Nocturia   . Testosterone overdose   . Vitamin D deficiency      Objective: Vital signs in last 24 hours: Temp:  [98.3 F (36.8 C)-98.5 F (36.9 C)] 98.5 F (36.9 C) (04/22 0507) Pulse Rate:  [54-79] 62 (04/22 0507) Resp:  [18-19] 18 (04/22 0507) BP: (113-146)/(54-60) 113/56 (04/22 0507) SpO2:  [100 %] 100 % (04/22 0507) Weight:  [215 lb 6.2 oz (97.7 kg)] 215 lb 6.2 oz (97.7 kg) (04/22 0507) Weight change: 2 lb 6.8 oz (1.1 kg) Last BM Date: 02/21/17  Intake/Output from previous day: 04/21 0701 - 04/22 0700 In: -  Out: 450 [Urine:450] Last cbgs: CBG (last 3)   Recent Labs  02/24/17 1724 02/24/17 2034 02/25/17 0625  GLUCAP 115* 129* 115*     Physical Exam General: No apparent distress   HEENT: not dry Lungs: Normal effort. Lungs clear to auscultation, no crackles or wheezes. Cardiovascular: Regular rate and rhythm, no edema Abdomen: S/NT/ND; BS(+) Musculoskeletal:  unchanged Neurological: No new neurological deficits With right central facial and mild right-sided weakness Wounds: N/A    Skin: clear   Mental state: Alert, oriented, cooperative    Lab Results: BMET    Component Value Date/Time   NA 136 02/22/2017 1630   NA 130 (L) 11/04/2013 0434   K 3.9 02/22/2017 1630   K 3.8 11/04/2013 0434   CL 100 (L) 02/22/2017 1630   CL 98 11/04/2013 0434   CO2 28  02/22/2017 1630   CO2 27 11/04/2013 0434   GLUCOSE 104 (H) 02/22/2017 1630   GLUCOSE 179 (H) 11/04/2013 0434   BUN 25 (H) 02/22/2017 1630   BUN 15 11/04/2013 0434   CREATININE 0.88 02/22/2017 1630   CREATININE 0.81 11/04/2013 0434   CALCIUM 9.9 02/22/2017 1630   CALCIUM 9.0 11/04/2013 0434   GFRNONAA >60 02/22/2017 1630   GFRNONAA >60 11/04/2013 0434   GFRAA >60 02/22/2017 1630   GFRAA >60 11/04/2013 0434   CBC    Component Value Date/Time   WBC 6.1 02/22/2017 1630   RBC 4.20 (L) 02/22/2017 1630   HGB 13.4 02/22/2017 1630   HGB 14.2 11/04/2013 0434   HCT 38.5 (L) 02/22/2017 1630   HCT 41.0 11/04/2013 0434   PLT 206 02/22/2017 1630   PLT 182 11/04/2013 0434   MCV 91.7 02/22/2017 1630   MCV 93 11/04/2013 0434   MCH 31.9 02/22/2017 1630   MCHC 34.8 02/22/2017 1630   RDW 13.1 02/22/2017 1630   RDW 12.5 11/04/2013 0434   LYMPHSABS 2.2 02/22/2017 1630   LYMPHSABS 0.8 (L) 11/04/2013 0434   MONOABS 0.7 02/22/2017 1630   MONOABS 1.0 11/04/2013 0434   EOSABS 0.1 02/22/2017 1630   EOSABS 0.0 11/04/2013 0434   BASOSABS 0.0 02/22/2017 1630   BASOSABS 0.0 11/04/2013 0434  BASOSABS 0 09/15/2012 1610    Medications: I have reviewed the patient's current medications.  Assessment/Plan:  Status post left MCA infarct with right hemiparesis.  Continue CIR Hypertension.  Remains well controlled.  Continue present regimen Diabetes mellitus type 2.  Excellent glycemic control.  Continue metformin and sliding scale coverage as needed DVD prophylaxis.  Continue Lovenox    Length of stay, days: 3  Rogelia Boga , MD 02/25/2017, 8:14 AM

## 2017-02-26 ENCOUNTER — Inpatient Hospital Stay (HOSPITAL_COMMUNITY): Payer: Managed Care, Other (non HMO)

## 2017-02-26 ENCOUNTER — Inpatient Hospital Stay (HOSPITAL_COMMUNITY): Payer: Managed Care, Other (non HMO) | Admitting: Speech Pathology

## 2017-02-26 ENCOUNTER — Inpatient Hospital Stay (HOSPITAL_COMMUNITY): Payer: Managed Care, Other (non HMO) | Admitting: Occupational Therapy

## 2017-02-26 ENCOUNTER — Encounter (HOSPITAL_COMMUNITY): Payer: Managed Care, Other (non HMO) | Admitting: Psychology

## 2017-02-26 ENCOUNTER — Inpatient Hospital Stay (HOSPITAL_COMMUNITY): Payer: Managed Care, Other (non HMO) | Admitting: Physical Therapy

## 2017-02-26 DIAGNOSIS — F809 Developmental disorder of speech and language, unspecified: Secondary | ICD-10-CM

## 2017-02-26 LAB — GLUCOSE, CAPILLARY
Glucose-Capillary: 105 mg/dL — ABNORMAL HIGH (ref 65–99)
Glucose-Capillary: 142 mg/dL — ABNORMAL HIGH (ref 65–99)
Glucose-Capillary: 145 mg/dL — ABNORMAL HIGH (ref 65–99)
Glucose-Capillary: 138 mg/dL — ABNORMAL HIGH (ref 65–99)

## 2017-02-26 NOTE — Progress Notes (Signed)
Occupational Therapy Session Note  Patient Details  Name: MANAV PIEROTTI MRN: [id number] Date of Birth: [birth year]  Today's Date: 02/26/2017 OT Individual Time: 1130-1228 OT Individual Time Calculation (min): 58 min    Skilled Therapeutic Interventions/Progress Updates: Patient exhibited impulsivity, right knee buckle (but he restabilized wihtout losing balance), right foot clearance with swing through, right hand decreased coordination, and right environmental inattention during this session.     He cooked noodles on the stove, exhibiting good balance for stooping to retrive pot from lower cabinet.    He turned on incorrect stove eye but did not notice/question that  the pot of water and stove had not started to heat after 5 minutes.  He required prompting to turn on correct eye.   ( Note:  Contributors may have also been due to unfamiliarity with this clinician and ADL apt stove may different from his home stove)  He required several verbal reminders to attend to his left environment while walking room to/fr kitchen - as either his right foot, arm or trunk 'bumped' into items on his right.  He was left seated in his recliner taking medication from his nurse at the end of the session.      Therapy Documentation Precautions:  Precautions Precautions: Fall Precaution Comments: mild R inattention during functional tasks Restrictions Weight Bearing Restrictions: No Pain: denied   ADL: vision/Perception  Right environment work as he ambulated [employer] and he retrieved items from shelves and counter.   He exhibited right visual inattention     See Function Navigator for Current Functional Status.   Therapy/Group: Individual Therapy  Bud Face [facility] 02/26/2017, 12:28 PM

## 2017-02-26 NOTE — Progress Notes (Signed)
Speech Language Pathology Daily Session Note  Patient Details  Name: Erik [name] MRN: [id number] Date of Birth: 17-May-1956  Today's Date: 02/26/2017 SLP Individual Time: 1400-1430 SLP Individual Time Calculation (min): 30 min  Short Term Goals: Week 1: SLP Short Term Goal 1 (Week 1): Patient will utilize speech intelligibility strategies at the sentence level to maximzie intelligibility to 90% with supervision verbal cues.  SLP Short Term Goal 2 (Week 1): Patient will self-monitor and correct articulatory errors with supervision verbal cues.  SLP Short Term Goal 3 (Week 1): Patient will decode at the word and phrase level with 75% accuracy and Min A multimodal cues.  SLP Short Term Goal 4 (Week 1): Patient will utilize word-finding strategies at the sentence level with Supervision verbal cues.  SLP Short Term Goal 5 (Week 1): Patient will recall new, daily events with use of external aids with supervision verbal cues.   Skilled Therapeutic Interventions: Skilled treatment session focused on speech and language goals. SLP facilitated session by providing Mod A phonemic and visual cues for decoding questions at the phrase level (4-5 words). Patient was Mod I for word-finding when answering questions but required Min-Mod A verbal cues to self-monitor and correct articulatory errors to achieve ~80% intelligibility. Patient left upright in recliner with all needs within reach. Continue with current plan of care.      Function:  Cognition Comprehension Comprehension assist level: Understands complex 90% of the time/cues 10% of the time  Expression   Expression assist level: Expresses basic 50 - 74% of the time/requires cueing 25 - 49% of the time. Needs to repeat parts of sentences.  Social Interaction Social Interaction assist level: Interacts appropriately 90% of the time - Needs monitoring or encouragement for participation or interaction.  Problem Solving Problem solving assist level:  Solves complex 90% of the time/cues < 10% of the time  Memory Memory assist level: Recognizes or recalls 75 - 89% of the time/requires cueing 10 - 24% of the time    Pain Pain Assessment Pain Assessment: No/denies pain  Therapy/Group: Individual Therapy  Erik [name] 02/26/2017, 3:23 PM

## 2017-02-26 NOTE — Progress Notes (Signed)
Subjective/Complaints:  Patient working with speech therapy on cognition and language only, swallowing has recovered. Patient hopes to transfer to IR service at end of the week to undergo right  carotid stent  ROS-  Denies CP,SOB, N/V/D  Objective: Vital Signs: Blood pressure 124/60, pulse 68, temperature 97.8 F (36.6 C), temperature source Oral, resp. rate 18, height  (1.803 m), weight 97.8 kg (215 lb 8.4 oz), SpO2 96 %. No results found. Results for orders placed or performed during the hospital encounter of 02/22/17 (from the past 72 hour(s))  Glucose, capillary     Status: Abnormal   Collection Time: 02/23/17  6:44 AM  Result Value Ref Range   Glucose-Capillary 106 (H) 65 - 99 mg/dL  Glucose, capillary     Status: Abnormal   Collection Time: 02/23/17 11:47 AM  Result Value Ref Range   Glucose-Capillary 125 (H) 65 - 99 mg/dL  Glucose, capillary     Status: Abnormal   Collection Time: 02/23/17  4:54 PM  Result Value Ref Range   Glucose-Capillary 151 (H) 65 - 99 mg/dL  Glucose, capillary     Status: Abnormal   Collection Time: 02/23/17  8:56 PM  Result Value Ref Range   Glucose-Capillary 164 (H) 65 - 99 mg/dL  Glucose, capillary     Status: Abnormal   Collection Time: 02/24/17  6:51 AM  Result Value Ref Range   Glucose-Capillary 124 (H) 65 - 99 mg/dL  Glucose, capillary     Status: Abnormal   Collection Time: 02/24/17 11:20 AM  Result Value Ref Range   Glucose-Capillary 160 (H) 65 - 99 mg/dL  Glucose, capillary     Status: Abnormal   Collection Time: 02/24/17  5:24 PM  Result Value Ref Range   Glucose-Capillary 115 (H) 65 - 99 mg/dL   Comment 1 Notify RN   Glucose, capillary     Status: Abnormal   Collection Time: 02/24/17  8:34 PM  Result Value Ref Range   Glucose-Capillary 129 (H) 65 - 99 mg/dL  Glucose, capillary     Status: Abnormal   Collection Time: 02/25/17  6:25 AM  Result Value Ref Range   Glucose-Capillary 115 (H) 65 - 99 mg/dL  Glucose, capillary      Status: Abnormal   Collection Time: 02/25/17 11:33 AM  Result Value Ref Range   Glucose-Capillary 143 (H) 65 - 99 mg/dL   Comment 1 Notify RN   Glucose, capillary     Status: Abnormal   Collection Time: 02/25/17  4:42 PM  Result Value Ref Range   Glucose-Capillary 109 (H) 65 - 99 mg/dL   Comment 1 Notify RN   Glucose, capillary     Status: Abnormal   Collection Time: 02/25/17  8:30 PM  Result Value Ref Range   Glucose-Capillary 155 (H) 65 - 99 mg/dL     HEENT: normal Cardio: RRR and no murmur Resp: CTA B/L and unlabored GI: BS positive and NT, ND Extremity:  No Edema Skin:   Intact Neuro: Cranial Nerve Abnormalities R central VII, Normal Sensory, Abnormal Motor 4/5 in RUE adn RLE, 5/5 on left side, Abnormal FMC Ataxic/ dec FMC, Dysarthric and Aphasic Musc/Skel:  Normal Gen NAD   Assessment/Plan: 1. Functional deficits secondary to Left MCA infarct which require 3+ hours per day of interdisciplinary therapy in a comprehensive inpatient rehab setting. Physiatrist is providing close team supervision and 24 hour management of active medical problems listed below. Physiatrist and rehab team continue to assess barriers to discharge/monitor  patient progress toward functional and medical goals. FIM: Function - Bathing Position: Shower Body parts bathed by patient: Right arm, Left arm, Chest, Abdomen, Front perineal area, Buttocks, Right upper leg, Left upper leg, Right lower leg, Left lower leg Body parts bathed by helper: Back Assist Level: Touching or steadying assistance(Pt > 75%)  Function- Upper Body Dressing/Undressing What is the patient wearing?: Pull over shirt/dress Pull over shirt/dress - Perfomed by patient: Thread/unthread right sleeve, Put head through opening, Thread/unthread left sleeve Pull over shirt/dress - Perfomed by helper: Pull shirt over trunk Assist Level: Touching or steadying assistance(Pt > 75%) Function - Lower Body Dressing/Undressing What is the  patient wearing?: Underwear, Pants, Shoes, Socks Position: Sitting EOB Underwear - Performed by patient: Thread/unthread right underwear leg, Thread/unthread left underwear leg Underwear - Performed by helper: Pull underwear up/down Pants- Performed by patient: Thread/unthread left pants leg, Thread/unthread right pants leg Pants- Performed by helper: Pull pants up/down Socks - Performed by patient: Don/doff left sock, Don/doff right sock Shoes - Performed by patient: Don/doff right shoe, Don/doff left shoe Shoes - Performed by helper: Fasten left, Fasten right Assist for footwear: Supervision/touching assist Assist for lower body dressing: Touching or steadying assistance (Pt > 75%)  Function - Toileting Toileting steps completed by patient: Adjust clothing prior to toileting, Performs perineal hygiene, Adjust clothing after toileting Toileting Assistive Devices: Grab bar or rail Assist level: Touching or steadying assistance (Pt.75%)  Function - Archivist transfer assistive device: Walker Assist level to toilet: Touching or steadying assistance (Pt > 75%) Assist level from toilet: Touching or steadying assistance (Pt > 75%)  Function - Chair/bed transfer Chair/bed transfer method: Ambulatory Chair/bed transfer assist level: Touching or steadying assistance (Pt > 75%) Chair/bed transfer assistive device: Ephraim Hamburger Chair/bed transfer details: Verbal cues for precautions/safety, Verbal cues for sequencing, Verbal cues for technique, Verbal cues for safe use of DME/AE, Manual facilitation for weight shifting  Function - Locomotion: Wheelchair Will patient use wheelchair at discharge?: No Wheelchair activity did not occur: N/A (pt ambulatory on unit) Function - Locomotion: Ambulation Assistive device: Cane-quad Max distance:  (150 ft) Assist level: Touching or steadying assistance (Pt > 75%) Assist level: Touching or steadying assistance (Pt > 75%) Assist level:  Touching or steadying assistance (Pt > 75%) Assist level: Touching or steadying assistance (Pt > 75%) Assist level: Touching or steadying assistance (Pt > 75%)  Function - Comprehension Comprehension: Auditory Comprehension assist level: Understands complex 90% of the time/cues 10% of the time  Function - Expression Expression: Verbal Expression assist level: Expresses basic 75 - 89% of the time/requires cueing 10 - 24% of the time. Needs helper to occlude trach/needs to repeat words.  Function - Social Interaction Social Interaction assist level: Interacts appropriately 90% of the time - Needs monitoring or encouragement for participation or interaction.  Function - Problem Solving Problem solving assist level: Solves complex 90% of the time/cues < 10% of the time  Function - Memory Memory assist level: More than reasonable amount of time Patient normally able to recall (first 3 days only): Current season, Location of own room, Staff names and faces, That he or she is in a hospital  Medical Problem List and Plan: 1.  Right hemiparesis and aphasia  secondary to L MCA infarct Cont  CIR PT,OT,SLP Plan right carotid stent at end of week, need to coordinate with Dr. Corliss Skains 2.  DVT Prophylaxis/Anticoagulation: Pharmaceutical: Lovenox 3. Pain Management: N/A 4. Mood: On Prozac. LCSW to follow for evaluation and  support.  5. Neuropsych: This patient is capable of making decisions on his own behalf. 6. Skin/Wound Care: routine pressure relief measures.  7. Fluids/Electrolytes/Nutrition: Monitor I/O. 8. ZOX:WRUEAVW BP bid. Currently well controlled. Will monitor to monitor on Norvasc, HCTZ and Cozaar. Encourage compliance.controlled 4/23 Vitals:   02/25/17 2100 02/26/17 0542  BP: (!) 143/64 124/60  Pulse:  68  Resp: 18 18  Temp:  97.8 F (36.6 C)   9. Dyslipidemia: Continue lipitor.  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  11. T2DM: New diagnosis with Hgb A1C 7.0. Monitor BS  ac/hs and use SSI for elevated BS. Will start patient on low dose metformin and titrate upwards as indicated. Well controlled 4/23 CBG (last 3)   Recent Labs  02/25/17 1133 02/25/17 1642 02/25/17 2030  GLUCAP 143* 109* 155*     12. Anemia: Continue to monitor. LOS (Days) 4 A FACE TO FACE EVALUATION WAS PERFORMED  KIRSTEINS,ANDREW E 02/26/2017, 6:22 AM

## 2017-02-26 NOTE — Progress Notes (Signed)
Speech Language Pathology Daily Session Note  Patient Details  Name: Erik [name] MRN: [id number] Date of Birth: Jun 13, 1956  Today's Date: 02/26/2017 SLP Individual Time: 4098-1191 SLP Individual Time Calculation (min): 41 min and Today's Date: 02/26/2017 SLP Missed Time: 4 Minutes  Short Term Goals: Week 1: SLP Short Term Goal 1 (Week 1): Patient will utilize speech intelligibility strategies at the sentence level to maximzie intelligibility to 90% with supervision verbal cues.  SLP Short Term Goal 2 (Week 1): Patient will self-monitor and correct articulatory errors with supervision verbal cues.  SLP Short Term Goal 3 (Week 1): Patient will decode at the word and phrase level with 75% accuracy and Min A multimodal cues.  SLP Short Term Goal 4 (Week 1): Patient will utilize word-finding strategies at the sentence level with Supervision verbal cues.  SLP Short Term Goal 5 (Week 1): Patient will recall new, daily events with use of external aids with supervision verbal cues.   Skilled Therapeutic Interventions: Skilled treatment session focused on addressing speech-language goals. SLP facilitated session by providing Max assist faded to Mod assist visual and phonemic cues to sound out and decode 1-2 words.  Patient then provided verbal descriptions of written nouns with Mod faded to Min assist verbal and non-verbal cues to self-monitor and correct pacing for improved speech intelligibility.  Patient with occasional word finding difficulty that semantic cues assisted with.  Continue with current plan of care.    Function:  Cognition Comprehension Comprehension assist level: Understands complex 90% of the time/cues 10% of the time  Expression   Expression assist level: Expresses basic 50 - 74% of the time/requires cueing 25 - 49% of the time. Needs to repeat parts of sentences.  Social Interaction Social Interaction assist level: Interacts appropriately 90% of the time - Needs monitoring or  encouragement for participation or interaction.  Problem Solving Problem solving assist level: Solves complex 90% of the time/cues < 10% of the time  Memory Memory assist level: Recognizes or recalls 75 - 89% of the time/requires cueing 10 - 24% of the time    Pain Pain Assessment Pain Assessment: No/denies pain  Therapy/Group: Individual Therapy  Charlane Ferretti., CCC-SLP [phone]  Tene Gato 02/26/2017, 1:19 PM

## 2017-02-26 NOTE — Progress Notes (Addendum)
Physical Therapy Note  Patient Details  Name: Erik [name] MRN: [id number] Date of Birth: 05-22-1956 Today's Date: 02/26/2017  1000-1100, 60 min individual tx Pain: none  Seated neuromuscular re-education via multimodal cues for R/L hip flex, ankle pumps, R long arc quad knee ext, alternating reciprocal PF/DF, bil heel raises. Min/mod cues needed throughout session to decrease impulsivity.  Gait to/from gym, with poor R foot clearance noted, min assist due to 1 LOB, otherwise supervision, with quad cane.  Seated without UE of LE support, focusing on reciprocal scooting to improve R hip protraction/retraction.  Standing heel cord stretch x 2 minutes , on wedge, and seated hamstring stretch, each x 2 mintues.  Sit>< stand blocked practice without use of UEs, with compliant disc under L foot to increase wt shift to R, x 7 without LOB,. With external pertubations, pt demonstrated minimal R ankle strategy, strong L ankle strategy, no R/L hip strategy, 0 R stepping strategy, strong L stepping strategy.  With practice, pt able to elicit bil hip strategy. High level balance, RLE/RUE wt bearing in quadruped, working on contralateral limb lifts (bird dogs) to challenge trunk, and knee walking to focus on wt shifting laterally. Gait to return to room, pushing grocery cart for increased fluidity of movement.  Pt left resting in recliner with  all needs within reach.  See function navigator for current status.  Brennon Otterness 02/26/2017, 7:53 AM

## 2017-02-26 NOTE — Consult Note (Signed)
Neuropsychological Consultation   Patient:   Erik [name]   DOB:   [birth year]  MR Number:  [id number]  Location:  [facility] [id number] [place] [address] Dept: [phone] Loc: [phone]           Date of Service:   02/26/2017  Start Time:   9 AM End Time:   10 AM  Provider/Observer:  Arley Phenix, Psy.D.       Clinical Neuropsychologist       Billing Code/Service: [phone] 4 Units  Chief Complaint:    The patient has had LMCA CVA with TPA.  Expressive aphasia, right hand weakness and left leg weakness.  Referred due to issues with coping and adjusting after CVA and concern about future potential vascular events.  Reason for Service:  Erik [name] a 61 y.o.male admitted on 02/06/17 after wife found patient lying on ground unable to speak.  CTA revealed hyperdense L-MCA.  Received TPA.  MRI/MRA showed multiple areas of infarct.  Acute ischemia on left supratentorial hemisphere in left basal ganglia and left temporal lobe.    Current Status:  Expressive language deficits with articulation and word finding issues.  Right side weakness and motor coordination issues.  Reliability of Information: Information from Patient's medical chart, discussions with treatment team and 1 hour clinical interview.  Behavioral Observation: Erik [name]  presents as a 61 y.o.-year-old Right Caucasian Male who appeared his stated age. his dress was Appropriate and he was Well Groomed and his manners were Appropriate to the situation.  his participation was indicative of Appropriate and Attentive behaviors.  There were physical disabilities noted.  he displayed an appropriate level of cooperation and motivation.     Interactions:    Active Appropriate and Attentive  Attention:   within normal limits and attention span and concentration were age appropriate  Memory:   within normal limits; recent  and remote memory intact  Visuo-spatial:  within normal limits  Speech (Volume):  normal  Speech:   non-fluent aphasia; slurred  Thought Process:  Coherent  Though Content:  WNL; not suicidal  Orientation:   person, place, time/date and situation  Judgment:   Good  Planning:   Good  Affect:    Appropriate  Mood:    no indication of severe depression or anxiety but the patient is struggling with adjusting to non-fluent aphasia.  Insight:   Good  Intelligence:   normal  Medical History:   Past Medical History:  Diagnosis Date  . Anxiety   . Arthritis    hands  . BPH (benign prostatic hyperplasia)   . ED (erectile dysfunction)   . H/O calcium pyrophosphate deposition disease (CPPD)   . HLD (hyperlipidemia)   . Hypogonadism in male   . Lower urinary tract symptoms (LUTS)   . Nocturia   . Testosterone overdose   . 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  Family Med/Psych History:  Family History  Problem Relation Age of Onset  . Diabetes Mother   . Hypertension Mother   . Dementia Mother   . Malig Hypertension Mother   . Hyperlipidemia Mother   . Heart Problems Mother   . Heart Problems Brother     Risk of Suicide/Violence: virtually non-existent Patient denies SI or HI  Impression/DX:  The patient presented with acute LMCA on 02/16/17.  Patient has non-fluent aphasia and right side weakness.    Disposition/Plan:  Worked on coping and dealing with acute adaptive and adjustment issues around primarily language changes.  Diagnosis:   LMCA, adjustment issues and expressive language deficits.        Electronically Signed   _______________________ Arley Phenix, Psy.D.

## 2017-02-27 ENCOUNTER — Inpatient Hospital Stay (HOSPITAL_COMMUNITY): Payer: Managed Care, Other (non HMO) | Admitting: Physical Therapy

## 2017-02-27 ENCOUNTER — Inpatient Hospital Stay (HOSPITAL_COMMUNITY): Payer: Managed Care, Other (non HMO) | Admitting: Speech Pathology

## 2017-02-27 ENCOUNTER — Inpatient Hospital Stay (HOSPITAL_COMMUNITY): Payer: Managed Care, Other (non HMO) | Admitting: Occupational Therapy

## 2017-02-27 LAB — GLUCOSE, CAPILLARY
GLUCOSE-CAPILLARY: 112 mg/dL — AB (ref 65–99)
GLUCOSE-CAPILLARY: 145 mg/dL — AB (ref 65–99)
GLUCOSE-CAPILLARY: 129 mg/dL — AB (ref 65–99)
Glucose-Capillary: 128 mg/dL — ABNORMAL HIGH (ref 65–99)

## 2017-02-27 NOTE — Progress Notes (Signed)
Occupational Therapy Session Note  Patient Details  Name: Erik [name] MRN: [id number] Date of Birth: 07/31/1956  Today's Date: 02/27/2017 OT Individual Time: 1000-1100 OT Individual Time Calculation (min): 60 min   Short Term Goals: Week 1:  OT Short Term Goal 1 (Week 1): LTG=STG 2/2 estimated LOS  Skilled Therapeutic Interventions/Progress Updates:    OT treatment session focused on R NMR, functional ambulation, standing balance, and transfer training. Pt declined any bathing/dressing this am. Min A to ambulate to therapy apartment with quad cane. Practiced ambulating on carpeted surface with intermittent Mod A 2/2 R toe catching on carpet- VC for R foot clearance. Practiced walk-in shower transfer w/ shower chair and min A to clear R foot over 4inch ledge. Weight bearing activity in quadruped position, then worked on fine-motor coordination and word finding activity using dry erase marker and mirror feedback. Facilitated normal movement patterns at shoulder/wrist/hand as well as functional gasp. Guided technique to asssist with [facility] and in-hand manipulation, translation, and rotation. Pt returned to room at end of session in similar fashion as described above.   Therapy Documentation Precautions:  Precautions Precautions: Fall Precaution Comments: mild R inattention during functional tasks Restrictions Weight Bearing Restrictions: No Pain:  denies pain ADL: ADL ADL Comments: Please see functional navigator  See Function Navigator for Current Functional Status.   Therapy/Group: Individual Therapy  Mal Amabile 02/27/2017, 11:02 AM

## 2017-02-27 NOTE — Progress Notes (Signed)
Subjective/Complaints:  Appreciate neuropsych note  ROS-  Denies CP,SOB, N/V/D  Objective: Vital Signs: Blood pressure (!) 145/64, pulse 67, temperature 98.6 F (37 C), temperature source Oral, resp. rate 18, height  (1.803 m), weight 96.4 kg (212 lb 8.4 oz), SpO2 97 %. No results found. Results for orders placed or performed during the hospital encounter of 02/22/17 (from the past 72 hour(s))  Glucose, capillary     Status: Abnormal   Collection Time: 02/24/17  6:51 AM  Result Value Ref Range   Glucose-Capillary 124 (H) 65 - 99 mg/dL  Glucose, capillary     Status: Abnormal   Collection Time: 02/24/17 11:20 AM  Result Value Ref Range   Glucose-Capillary 160 (H) 65 - 99 mg/dL  Glucose, capillary     Status: Abnormal   Collection Time: 02/24/17  5:24 PM  Result Value Ref Range   Glucose-Capillary 115 (H) 65 - 99 mg/dL   Comment 1 Notify RN   Glucose, capillary     Status: Abnormal   Collection Time: 02/24/17  8:34 PM  Result Value Ref Range   Glucose-Capillary 129 (H) 65 - 99 mg/dL  Glucose, capillary     Status: Abnormal   Collection Time: 02/25/17  6:25 AM  Result Value Ref Range   Glucose-Capillary 115 (H) 65 - 99 mg/dL  Glucose, capillary     Status: Abnormal   Collection Time: 02/25/17 11:33 AM  Result Value Ref Range   Glucose-Capillary 143 (H) 65 - 99 mg/dL   Comment 1 Notify RN   Glucose, capillary     Status: Abnormal   Collection Time: 02/25/17  4:42 PM  Result Value Ref Range   Glucose-Capillary 109 (H) 65 - 99 mg/dL   Comment 1 Notify RN   Glucose, capillary     Status: Abnormal   Collection Time: 02/25/17  8:30 PM  Result Value Ref Range   Glucose-Capillary 155 (H) 65 - 99 mg/dL  Glucose, capillary     Status: Abnormal   Collection Time: 02/26/17  7:00 AM  Result Value Ref Range   Glucose-Capillary 105 (H) 65 - 99 mg/dL  Glucose, capillary     Status: Abnormal   Collection Time: 02/26/17 11:42 AM  Result Value Ref Range   Glucose-Capillary 142  (H) 65 - 99 mg/dL  Glucose, capillary     Status: Abnormal   Collection Time: 02/26/17  4:43 PM  Result Value Ref Range   Glucose-Capillary 145 (H) 65 - 99 mg/dL  Glucose, capillary     Status: Abnormal   Collection Time: 02/26/17  9:12 PM  Result Value Ref Range   Glucose-Capillary 138 (H) 65 - 99 mg/dL     HEENT: normal Cardio: RRR and no murmur Resp: CTA B/L and unlabored GI: BS positive and NT, ND Extremity:  No Edema Skin:   Intact Neuro: Cranial Nerve Abnormalities R central VII, Normal Sensory, Abnormal Motor 4/5 in RUE adn RLE, 5/5 on left side, Abnormal FMC Ataxic/ dec FMC, Dysarthric and Aphasic Musc/Skel:  Normal Gen NAD   Assessment/Plan: 1. Functional deficits secondary to Left MCA infarct which require 3+ hours per day of interdisciplinary therapy in a comprehensive inpatient rehab setting. Physiatrist is providing close team supervision and 24 hour management of active medical problems listed below. Physiatrist and rehab team continue to assess barriers to discharge/monitor patient progress toward functional and medical goals. FIM: Function - Bathing Position: Shower Body parts bathed by patient: Right arm, Left arm, Chest, Abdomen, Front perineal area,  Buttocks, Right upper leg, Left upper leg, Right lower leg, Left lower leg Body parts bathed by helper: Back Assist Level: Touching or steadying assistance(Pt > 75%)  Function- Upper Body Dressing/Undressing What is the patient wearing?: Pull over shirt/dress Pull over shirt/dress - Perfomed by patient: Thread/unthread right sleeve, Put head through opening, Thread/unthread left sleeve Pull over shirt/dress - Perfomed by helper: Pull shirt over trunk Assist Level: Touching or steadying assistance(Pt > 75%) Function - Lower Body Dressing/Undressing What is the patient wearing?: Underwear, Pants, Shoes, Socks Position: Sitting EOB Underwear - Performed by patient: Thread/unthread right underwear leg,  Thread/unthread left underwear leg Underwear - Performed by helper: Pull underwear up/down Pants- Performed by patient: Thread/unthread left pants leg, Thread/unthread right pants leg Pants- Performed by helper: Pull pants up/down Socks - Performed by patient: Don/doff left sock, Don/doff right sock Shoes - Performed by patient: Don/doff right shoe, Don/doff left shoe Shoes - Performed by helper: Fasten left, Fasten right Assist for footwear: Supervision/touching assist Assist for lower body dressing: Touching or steadying assistance (Pt > 75%)  Function - Toileting Toileting steps completed by patient: Adjust clothing prior to toileting, Performs perineal hygiene, Adjust clothing after toileting Toileting Assistive Devices: Grab bar or rail Assist level: Touching or steadying assistance (Pt.75%)  Function - Archivist transfer assistive device: Walker Assist level to toilet: Touching or steadying assistance (Pt > 75%) Assist level from toilet: Touching or steadying assistance (Pt > 75%)  Function - Chair/bed transfer Chair/bed transfer method: Ambulatory Chair/bed transfer assist level: Touching or steadying assistance (Pt > 75%) Chair/bed transfer assistive device: Cane Chair/bed transfer details: Verbal cues for precautions/safety, Verbal cues for technique  Function - Locomotion: Wheelchair Will patient use wheelchair at discharge?: No Wheelchair activity did not occur: N/A (pt ambulatory on unit) Function - Locomotion: Ambulation Assistive device: Cane-quad Max distance: 200 Assist level: Touching or steadying assistance (Pt > 75%) Assist level: Touching or steadying assistance (Pt > 75%) Assist level: Touching or steadying assistance (Pt > 75%) Assist level: Touching or steadying assistance (Pt > 75%) Assist level: Touching or steadying assistance (Pt > 75%)  Function - Comprehension Comprehension: Auditory Comprehension assist level: Understands complex 90%  of the time/cues 10% of the time  Function - Expression Expression: Verbal Expression assist level: Expresses basic 50 - 74% of the time/requires cueing 25 - 49% of the time. Needs to repeat parts of sentences.  Function - Social Interaction Social Interaction assist level: Interacts appropriately 90% of the time - Needs monitoring or encouragement for participation or interaction.  Function - Problem Solving Problem solving assist level: Solves complex 90% of the time/cues < 10% of the time  Function - Memory Memory assist level: Recognizes or recalls 75 - 89% of the time/requires cueing 10 - 24% of the time Patient normally able to recall (first 3 days only): Current season, Location of own room, Staff names and faces, That he or she is in a hospital  Medical Problem List and Plan: 1.  Right hemiparesis and aphasia  secondary to L MCA infarct Cont  CIR PT,OT,SLP team conf in am Plan right carotid stent at end of week, need to coordinate with Dr. Corliss Skains 2.  DVT Prophylaxis/Anticoagulation: Pharmaceutical: Lovenox 3. Pain Management: N/A 4. Mood: On Prozac. LCSW to follow for evaluation and support.  5. Neuropsych: This patient is capable of making decisions on his own behalf. 6. Skin/Wound Care: routine pressure relief measures.  7. Fluids/Electrolytes/Nutrition: Monitor I/O. 8. ZOX:WRUEAVW BP bid. Currently well controlled.  Will monitor to monitor on Norvasc, HCTZ and Cozaar. Encourage compliance.controlled 4/23 Vitals:   02/26/17 1127 02/26/17 1432  BP: 129/71 (!) 145/64  Pulse:  67  Resp:  18  Temp:  98.6 F (37 C)   9. Dyslipidemia: Continue lipitor.  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  11. T2DM: New diagnosis with Hgb A1C 7.0. Monitor BS ac/hs and use SSI for elevated BS. Will start patient on low dose metformin and titrate upwards as indicated. Well controlled 4/24 CBG (last 3)   Recent Labs  02/26/17 1142 02/26/17 1643 02/26/17 2112  GLUCAP 142* 145* 138*      12. Anemia: Continue to monitor. LOS (Days) 5 A FACE TO FACE EVALUATION WAS PERFORMED  KIRSTEINS,ANDREW E 02/27/2017, 6:35 AM

## 2017-02-27 NOTE — Progress Notes (Signed)
Physical Therapy Session Note  Patient Details  Name: Erik [name] MRN: [id number] Date of Birth: [birth year]  Today's Date: 02/27/2017 PT Individual Time: 1515-1559 PT Individual Time Calculation (min): 44 min   Short Term Goals: Week 1:  PT Short Term Goal 1 (Week 1): =LTGs due to ELOS  Skilled Therapeutic Interventions/Progress Updates:    no c/o pain.  Session focus on gait training with SBQC and NMR.    Pt ambulates to and from therapy gym with close supervision with [facility] with nonskid socks on.  Pt with decrease R foot clearance with socks, orthotist currently working on R toe cap.  Nustep x10 minutes at level 3 with 4 extremities for reciprocal stepping pattern retraining, and overall activity tolerance.  NMR via alternating toe taps to 3" step to fatigue focus on foot clearance and decreased circumduction/compensation.  Stair negotiation 3x8 steps (3" height) alternating pattern skipping a step focus on increased step length and hip strength.  Pt returned to room at end of session with improvement in R foot clearance.  Positioned in recliner with call bell in reach and needs met.   Therapy Documentation Precautions:  Precautions Precautions: Fall Precaution Comments: mild R inattention during functional tasks Restrictions Weight Bearing Restrictions: No   See Function Navigator for Current Functional Status.   Therapy/Group: Individual Therapy  Earnest Conroy Penven-Crew 02/27/2017, 4:00 PM

## 2017-02-27 NOTE — Progress Notes (Signed)
Speech Language Pathology Daily Session Note  Patient Details  Name: Erik [name] MRN: [id number] Date of Birth: 04-Apr-1956  Today's Date: 02/27/2017 SLP Individual Time: 1330-1400 SLP Individual Time Calculation (min): 30 min  Short Term Goals: Week 1: SLP Short Term Goal 1 (Week 1): Patient will utilize speech intelligibility strategies at the sentence level to maximzie intelligibility to 90% with supervision verbal cues.  SLP Short Term Goal 2 (Week 1): Patient will self-monitor and correct articulatory errors with supervision verbal cues.  SLP Short Term Goal 3 (Week 1): Patient will decode at the word and phrase level with 75% accuracy and Min A multimodal cues.  SLP Short Term Goal 4 (Week 1): Patient will utilize word-finding strategies at the sentence level with Supervision verbal cues.  SLP Short Term Goal 5 (Week 1): Patient will recall new, daily events with use of external aids with supervision verbal cues.   Skilled Therapeutic Interventions: Skilled treatment session focused on speech and language goals. SLP facilitated session by providing Mod A verbal and visual cues for patient to decode at the sentence level. However, patient was Mod I for reading comprehension at the sentence level. SLP also facilitated session by utilizing audio feedback to maximize patient's emergent awareness of decreased speech intelligibility due to imprecise consonants and increased speech rate. With the use of audio feedback, patient was 100% intelligible with Mod I but was only ~75% intelligible without audio feedback. Patient left upright in recliner with all needs within reach. Continue with current plan of care.      Function:   Cognition Comprehension Comprehension assist level: Understands complex 90% of the time/cues 10% of the time  Expression   Expression assist level: Expresses basic 75 - 89% of the time/requires cueing 10 - 24% of the time. Needs helper to occlude trach/needs to repeat  words.  Social Interaction Social Interaction assist level: Interacts appropriately 90% of the time - Needs monitoring or encouragement for participation or interaction.  Problem Solving Problem solving assist level: Solves complex 90% of the time/cues < 10% of the time  Memory Memory assist level: Recognizes or recalls 90% of the time/requires cueing < 10% of the time    Pain No/Denies Pain   Therapy/Group: Individual Therapy  Louis Gaw 02/27/2017, 3:32 PM

## 2017-02-27 NOTE — Progress Notes (Signed)
Physical Therapy Session Note  Patient Details  Name: Erik [name] MRN: [id number] Date of Birth: 1955/11/17  Today's Date: 02/27/2017 PT Individual Time: 1100-1200 PT Individual Time Calculation (min): 60 min   Short Term Goals: Week 1:  PT Short Term Goal 1 (Week 1): =LTGs due to ELOS  Skilled Therapeutic Interventions/Progress Updates:    no c/o pain.  Session focus on NMR for ankle strategy, balance, weight shifting, and gait.    Pt ambulates to and from therapy gym with supervision and SBQC.  PT applied shoe cover over toe of R shoe (simulating toe cap) to improve R foot clearance.  NMR in standing on foam during horseshoe activity attempt to elicit R ankle strategy unsuccessful.  Transition to RLE support on dynadisk during horseshoes activity with successful elicitation of R ankle strategy.  Biodex maze training and weight shifting focus on weight shifts and control.  Pt engaged in Wii balance games (tilt board and soccer balls) for weight shift R and L.  High level gait through cones with supervision and mod verbal cues fade to min through cone obstacle course.  Pt ambulates back to room with supervision, call bell in reach and needs met.   Therapy Documentation Precautions:  Precautions Precautions: Fall Precaution Comments: mild R inattention during functional tasks Restrictions Weight Bearing Restrictions: No   See Function Navigator for Current Functional Status.   Therapy/Group: Individual Therapy  Earnest Conroy Penven-Crew 02/27/2017, 12:23 PM

## 2017-02-28 ENCOUNTER — Inpatient Hospital Stay (HOSPITAL_COMMUNITY): Payer: Managed Care, Other (non HMO) | Admitting: Occupational Therapy

## 2017-02-28 ENCOUNTER — Inpatient Hospital Stay (HOSPITAL_COMMUNITY): Payer: Managed Care, Other (non HMO) | Admitting: Physical Therapy

## 2017-02-28 ENCOUNTER — Inpatient Hospital Stay (HOSPITAL_COMMUNITY): Payer: Managed Care, Other (non HMO) | Admitting: Speech Pathology

## 2017-02-28 LAB — BASIC METABOLIC PANEL
ANION GAP: 8 (ref 5–15)
BUN: 21 mg/dL — ABNORMAL HIGH (ref 6–20)
CO2: 28 mmol/L (ref 22–32)
CREATININE: 0.95 mg/dL (ref 0.61–1.24)
Calcium: 9.6 mg/dL (ref 8.9–10.3)
Chloride: 100 mmol/L — ABNORMAL LOW (ref 101–111)
GFR calc non Af Amer: >60 mL/min (ref >60)
Glucose, Bld: 126 mg/dL — ABNORMAL HIGH (ref 65–99)
Potassium: 3.9 mmol/L (ref 3.5–5.1)
SODIUM: 136 mmol/L (ref 135–145)

## 2017-02-28 LAB — CBC
HEMATOCRIT: 33.3 % — AB (ref 39.0–52.0)
Hemoglobin: 11.3 g/dL — ABNORMAL LOW (ref 13.0–17.0)
MCH: 31.1 pg (ref 26.0–34.0)
MCHC: 33.9 g/dL (ref 30.0–36.0)
MCV: 91.7 fL (ref 78.0–100.0)
Platelets: 249 10*3/uL (ref 150–400)
RBC: 3.63 MIL/uL — AB (ref 4.22–5.81)
RDW: 13 % (ref 11.5–15.5)
WBC: 5.1 10*3/uL (ref 4.0–10.5)

## 2017-02-28 LAB — GLUCOSE, CAPILLARY
GLUCOSE-CAPILLARY: 141 mg/dL — AB (ref 65–99)
Glucose-Capillary: 113 mg/dL — ABNORMAL HIGH (ref 65–99)
Glucose-Capillary: 147 mg/dL — ABNORMAL HIGH (ref 65–99)
Glucose-Capillary: 110 mg/dL — ABNORMAL HIGH (ref 65–99)

## 2017-02-28 MED ORDER — ACETAMINOPHEN 325 MG PO TABS: 650.0000 mg | ORAL_TABLET | ORAL | Status: AC | PRN

## 2017-02-28 NOTE — Progress Notes (Signed)
Physical Therapy Session Note  Patient Details  Name: Erik [name] MRN: [id number] Date of Birth: 18-Mar-1956  Today's Date: 02/28/2017 PT Individual Time: 1030-1100 PT Individual Time Calculation (min): 30 min   Short Term Goals: Week 1:  PT Short Term Goal 1 (Week 1): =LTGs due to ELOS  Skilled Therapeutic Interventions/Progress Updates:    no c/o pain.  Session focus on gait with SPC, and NMR for RLE coordination.  Pt ambulates to therapy gym with [facility] and close supervision, continues to require CGA/steady assist for balance when distracted due to decreased R step length and poor balance strategies.  Gait training with [place] x200' with close supervision and min verbal cues for gait speed and increased R step length.  NMR in // bars with R toe taps and alternating toe taps to 4" box focus on R foot clearance.  Pt completed 2 trials to fatigue (1 standing rest break between trials followed by seated rest break after 2nd trial).  NMR for R hip stability, balance, and righting reactions, with L foot propped on 4" box during ball toss activity.  Pt returned to room with [facility] at end of session and positioned in recliner with call bell in reach and needs met.   Therapy Documentation Precautions:  Precautions Precautions: Fall Precaution Comments: mild R inattention during functional tasks Restrictions Weight Bearing Restrictions: No   See Function Navigator for Current Functional Status.   Therapy/Group: Individual Therapy  Earnest Conroy Penven-Crew 02/28/2017, 12:26 PM

## 2017-02-28 NOTE — Progress Notes (Signed)
Speech Language Pathology Daily Session Note  Patient Details  Name: Erik [name] MRN: [id number] Date of Birth: July 11, 1956  Today's Date: 02/28/2017 SLP Individual Time: 4098-1191 SLP Individual Time Calculation (min): 45 min  Short Term Goals: Week 1: SLP Short Term Goal 1 (Week 1): Patient will utilize speech intelligibility strategies at the sentence level to maximzie intelligibility to 90% with supervision verbal cues.  SLP Short Term Goal 2 (Week 1): Patient will self-monitor and correct articulatory errors with supervision verbal cues.  SLP Short Term Goal 3 (Week 1): Patient will decode at the word and phrase level with 75% accuracy and Min A multimodal cues.  SLP Short Term Goal 4 (Week 1): Patient will utilize word-finding strategies at the sentence level with Supervision verbal cues.  SLP Short Term Goal 5 (Week 1): Patient will recall new, daily events with use of external aids with supervision verbal cues.   Skilled Therapeutic Interventions: Skilled treatment session focused on speech and language goals. SLP facilitated session by providing Min A verbal and visual cues for decoding [facility] questions at the sentence level and supervision verbal cues for reading comprehension. Patient was Mod I for written expression at the word level in regards to answer to [facility] questions with 100% accuracy. Patient left upright in recliner with all needs within reach. Continue with current plan of care.       Function:  Cognition Comprehension Comprehension assist level: Understands complex 90% of the time/cues 10% of the time  Expression   Expression assist level: Expresses basic 75 - 89% of the time/requires cueing 10 - 24% of the time. Needs helper to occlude trach/needs to repeat words.  Social Interaction Social Interaction assist level: Interacts appropriately 90% of the time - Needs monitoring or encouragement for participation or interaction.  Problem Solving Problem solving assist level:  Solves complex 90% of the time/cues < 10% of the time  Memory Memory assist level: Recognizes or recalls 90% of the time/requires cueing < 10% of the time    Pain No/Denies Pain   Therapy/Group: Individual Therapy  Dorcus Riga 02/28/2017, 4:21 PM

## 2017-02-28 NOTE — Progress Notes (Signed)
Occupational Therapy Session Note  Patient Details  Name: Erik [name] MRN: [id number] Date of Birth: [birth year]  Today's Date: 02/28/2017  Session 1 OT Individual Time: 1130-1200 OT Individual Time Calculation (min): 30 min   Session 2 OT Individual Time: 5621-3086 OT Individual Time Calculation (min): 42 min    Short Term Goals: Week 1:  OT Short Term Goal 1 (Week 1): LTG=STG 2/2 estimated LOS  Skilled Therapeutic Interventions/Progress Updates:    Session 1 1:1 OT treatment session focused on R fine-motor coordination. Pt ambulated to therapy gym with min guard A and SPC. Pt given handout of HEP fine-motor and theraputty exercises Pt completed steps 1-4 of theraputty exercises incorporated processing, comprehension, and word finding by having pt read directions, then demonstrate comprehension by completing exercise. Facilitated normal movement patterns proximal to distal.Pt ambulated back to room in similar fashion as above.   Session 2 OT treatment session focused on B UE coordination, R FMC, standing balance/standing endurance. Addressed R FMC using graded hand-writing activity, began with drawing a straight line, progressing to name writing. Provided proximal support at trunk to keep UB still to recruit only fine motor movements at hand to write name. Addressed in-hand manipulation focused on palmer translation, rotation, and B UE coordination with graded beading task using beads of different sizes and shapes. Worked on buttoning large and small buttons, as well as tying, tactile cues for normal proximal shoulder movement. Pt ambulated back to room with SPC and min guard A. Therapy Documentation Precautions:  Precautions Precautions: Fall Precaution Comments: mild R inattention during functional tasks Restrictions Weight Bearing Restrictions: No Pain:  denies pain ADL: ADL ADL Comments: Please see functional navigator See Function Navigator for Current Functional  Status.   Therapy/Group: Individual Therapy  Mal Amabile 02/28/2017, 4:14 PM

## 2017-02-28 NOTE — Patient Care Conference (Signed)
Inpatient RehabilitationTeam Conference and Plan of Care Update Date: 02/28/2017   Time: 11:00 AM    Patient Name: Erik [name]      Medical Record Number: [id number]  Date of Birth: [birth year] Sex: Male         Room/Bed: 4W03C/4W03C-01 Payor Info: Payor: C[employer] / Plan: CIGNA MANAGED / Product Type: *No Product type* /    Admitting Diagnosis: CVA  Admit Date/Time:  02/22/2017  4:10 PM Admission Comments: No comment available   Primary Diagnosis:  <principal problem not specified> Principal Problem: <principal problem not specified>  Patient Active Problem List   Diagnosis Date Noted  . Language impairment   . ICAO (internal carotid artery occlusion), left 02/22/2017  . Acute respiratory failure (HCC) 02/22/2017  . Obesity 02/22/2017  . Expressive aphasia 02/22/2017  . Acute ischemic left MCA stroke (HCC) 02/22/2017  . Left hemiparesis (HCC)   . Status post total bilateral knee replacement   . Benign essential HTN   . Right-sided extracranial carotid artery stenosis   . Dysphagia, post-stroke   . Acute blood loss anemia   . Nocturia   . Newly diagnosed diabetes (HCC)   . CVA (cerebral vascular accident) (HCC) - L MCA s/p tPA and thrombectomy 02/16/2017  . Anxiety 04/30/2015  . Benign fibroma of prostate 04/30/2015  . Clinical depression 04/30/2015  . Acid reflux 04/30/2015  . HLD (hyperlipidemia) 04/30/2015  . BP (high blood pressure) 04/30/2015  . Arthritis, degenerative 04/30/2015  . 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 Arthritis, septic, knee (HCC) 09/26/2012    Expected Discharge Date: Expected Discharge Date: 03/03/17  Team Members Present: Physician leading conference: Dr. Claudette Laws Social Worker Present: Dossie Der, LCSW Nurse Present: Carmie End, RN PT Present: Teodoro Kil, PT OT Present: Kearney Hard, OT SLP Present: Feliberto Gottron, SLP PPS Coordinator present : Tora Duck, RN, CRRN     Current Status/Progress  Goal Weekly Team Focus  Medical   pt still aphasic, no TIAs or fluctuation symptoms  maintain BP >115 sys  D/C planning   Bowel/Bladder   Continent of bowel and bladder  remain continent of bowel and bladder  Assist with toileting needs prn   Swallow/Nutrition/ Hydration             ADL's   Min A overall  Mod I  pt/family ed, NMR, transfer training   Mobility   supervision overall  mod I overall with LRAD for ambulation  balance, attention, visual scanning, gait with LRAD   Communication   Supervision-Min A  Supervision  speech intelligibility and decoding    Safety/Cognition/ Behavioral Observations  Supervision  Supervision  awareness, recall    Pain   no c/o pain  0  Assess pain q shift and prn   Skin   no skin issues  no skin issues  Assess skin q shift and prn      *See Care Plan and progress notes for long and short-term goals.  Barriers to Discharge: will need R ICA stent    Possible Resolutions to Barriers:  set up ICA stent as outpt, family ed, should get to mod I    Discharge Planning/Teaching Needs:  HOme with wife and daughter and will have brother come and stay if needing 24 hr supervision. Pt making good progress here.      Team Discussion:  Goals mod/I level doing well in therapies. CEA next week as an OP wife wanted done prior to discharge from rehab-MD addressing with  her. Working on intelligibility and r-inattention issues. Wife to be in this afternoon.  Revisions to Treatment Plan:  DC 4/28   Continued Need for Acute Rehabilitation Level of Care: The patient requires daily medical management by a physician with specialized training in physical medicine and rehabilitation for the following conditions: Daily direction of a multidisciplinary physical rehabilitation program to ensure safe treatment while eliciting the highest outcome that is of practical value to the patient.: Yes Daily medical management of patient stability for increased activity during  participation in an intensive rehabilitation regime.: Yes Daily analysis of laboratory values and/or radiology reports with any subsequent need for medication adjustment of medical intervention for : Neurological problems  Ellanora Rayborn, Lemar Livings 02/28/2017, 1:52 PM

## 2017-02-28 NOTE — Progress Notes (Signed)
Subjective/Complaints:  Discussed with pt that Dr Estanislado Pandy is on vacation this week and will not be back in hospital until Monday  ROS-  Denies CP,SOB, N/V/D  Objective: Vital Signs: Blood pressure 116/67, pulse 97, temperature 98 F (36.7 C), temperature source Oral, resp. rate 18, height '5\' 11"'  (1.803 m), weight 96.6 kg (212 lb 15.4 oz), SpO2 93 %. No results found. Results for orders placed or performed during the hospital encounter of 02/22/17 (from the past 72 hour(s))  Glucose, capillary     Status: Abnormal   Collection Time: 02/25/17 11:33 AM  Result Value Ref Range   Glucose-Capillary 143 (H) 65 - 99 mg/dL   Comment 1 Notify RN   Glucose, capillary     Status: Abnormal   Collection Time: 02/25/17  4:42 PM  Result Value Ref Range   Glucose-Capillary 109 (H) 65 - 99 mg/dL   Comment 1 Notify RN   Glucose, capillary     Status: Abnormal   Collection Time: 02/25/17  8:30 PM  Result Value Ref Range   Glucose-Capillary 155 (H) 65 - 99 mg/dL  Glucose, capillary     Status: Abnormal   Collection Time: 02/26/17  7:00 AM  Result Value Ref Range   Glucose-Capillary 105 (H) 65 - 99 mg/dL  Glucose, capillary     Status: Abnormal   Collection Time: 02/26/17 11:42 AM  Result Value Ref Range   Glucose-Capillary 142 (H) 65 - 99 mg/dL  Glucose, capillary     Status: Abnormal   Collection Time: 02/26/17  4:43 PM  Result Value Ref Range   Glucose-Capillary 145 (H) 65 - 99 mg/dL  Glucose, capillary     Status: Abnormal   Collection Time: 02/26/17  9:12 PM  Result Value Ref Range   Glucose-Capillary 138 (H) 65 - 99 mg/dL  Glucose, capillary     Status: Abnormal   Collection Time: 02/27/17  6:27 AM  Result Value Ref Range   Glucose-Capillary 128 (H) 65 - 99 mg/dL  Glucose, capillary     Status: Abnormal   Collection Time: 02/27/17 12:01 PM  Result Value Ref Range   Glucose-Capillary 112 (H) 65 - 99 mg/dL  Glucose, capillary     Status: Abnormal   Collection Time: 02/27/17  4:25  PM  Result Value Ref Range   Glucose-Capillary 145 (H) 65 - 99 mg/dL  Glucose, capillary     Status: Abnormal   Collection Time: 02/27/17  9:49 PM  Result Value Ref Range   Glucose-Capillary 129 (H) 65 - 99 mg/dL  Basic metabolic panel     Status: Abnormal   Collection Time: 02/28/17  5:08 AM  Result Value Ref Range   Sodium 136 135 - 145 mmol/L   Potassium 3.9 3.5 - 5.1 mmol/L   Chloride 100 (L) 101 - 111 mmol/L   CO2 28 22 - 32 mmol/L   Glucose, Bld 126 (H) 65 - 99 mg/dL   BUN 21 (H) 6 - 20 mg/dL   Creatinine, Ser 0.95 0.61 - 1.24 mg/dL   Calcium 9.6 8.9 - 10.3 mg/dL   GFR calc non Af Amer >60 >60 mL/min   GFR calc Af Amer >60 >60 mL/min    Comment: (NOTE) The eGFR has been calculated using the CKD EPI equation. This calculation has not been validated in all clinical situations. eGFR's persistently <60 mL/min signify possible Chronic Kidney Disease.    Anion gap 8 5 - 15  CBC     Status: Abnormal  Collection Time: 02/28/17  5:08 AM  Result Value Ref Range   WBC 5.1 4.0 - 10.5 K/uL   RBC 3.63 (L) 4.22 - 5.81 MIL/uL   Hemoglobin 11.3 (L) 13.0 - 17.0 g/dL   HCT 33.3 (L) 39.0 - 52.0 %   MCV 91.7 78.0 - 100.0 fL   MCH 31.1 26.0 - 34.0 pg   MCHC 33.9 30.0 - 36.0 g/dL   RDW 13.0 11.5 - 15.5 %   Platelets 249 150 - 400 K/uL  Glucose, capillary     Status: Abnormal   Collection Time: 02/28/17  7:05 AM  Result Value Ref Range   Glucose-Capillary 113 (H) 65 - 99 mg/dL     HEENT: normal Cardio: RRR and no murmur Resp: CTA B/L and unlabored GI: BS positive and NT, ND Extremity:  No Edema Skin:   Intact Neuro: Cranial Nerve Abnormalities R central VII, Normal Sensory, Abnormal Motor 4/5 in RUE adn RLE, 5/5 on left side, Abnormal FMC Ataxic/ dec FMC, Dysarthric and Aphasic Musc/Skel:  Normal Gen NAD   Assessment/Plan: 1. Functional deficits secondary to Left MCA infarct which require 3+ hours per day of interdisciplinary therapy in a comprehensive inpatient rehab  setting. Physiatrist is providing close team supervision and 24 hour management of active medical problems listed below. Physiatrist and rehab team continue to assess barriers to discharge/monitor patient progress toward functional and medical goals. FIM: Function - Bathing Position: Shower Body parts bathed by patient: Right arm, Left arm, Chest, Abdomen, Front perineal area, Buttocks, Right upper leg, Left upper leg, Right lower leg, Left lower leg Body parts bathed by helper: Back Assist Level: Touching or steadying assistance(Pt > 75%)  Function- Upper Body Dressing/Undressing What is the patient wearing?: Pull over shirt/dress Pull over shirt/dress - Perfomed by patient: Thread/unthread right sleeve, Put head through opening, Thread/unthread left sleeve Pull over shirt/dress - Perfomed by helper: Pull shirt over trunk Assist Level: Touching or steadying assistance(Pt > 75%) Function - Lower Body Dressing/Undressing What is the patient wearing?: Underwear, Pants, Shoes, Socks Position: Sitting EOB Underwear - Performed by patient: Thread/unthread right underwear leg, Thread/unthread left underwear leg Underwear - Performed by helper: Pull underwear up/down Pants- Performed by patient: Thread/unthread left pants leg, Thread/unthread right pants leg Pants- Performed by helper: Pull pants up/down Socks - Performed by patient: Don/doff left sock, Don/doff right sock Shoes - Performed by patient: Don/doff right shoe, Don/doff left shoe Shoes - Performed by helper: Fasten left, Fasten right Assist for footwear: Supervision/touching assist Assist for lower body dressing: Touching or steadying assistance (Pt > 75%)  Function - Toileting Toileting steps completed by patient: Adjust clothing prior to toileting, Performs perineal hygiene, Adjust clothing after toileting Toileting Assistive Devices: Grab bar or rail Assist level: Touching or steadying assistance (Pt.75%)  Function - Toilet  Transfers Toilet transfer assistive device: Walker Assist level to toilet: Touching or steadying assistance (Pt > 75%) Assist level from toilet: Touching or steadying assistance (Pt > 75%)  Function - Chair/bed transfer Chair/bed transfer method: Ambulatory Chair/bed transfer assist level: Supervision or verbal cues Chair/bed transfer assistive device: Armrests, Cane Chair/bed transfer details: Verbal cues for precautions/safety, Verbal cues for technique  Function - Locomotion: Wheelchair Will patient use wheelchair at discharge?: No Wheelchair activity did not occur: N/A (pt ambulatory on unit) Function - Locomotion: Ambulation Assistive device: Cane-quad Max distance: 200 Assist level: Supervision or verbal cues Assist level: Supervision or verbal cues Assist level: Supervision or verbal cues Assist level: Supervision or verbal cues Assist  level: Touching or steadying assistance (Pt > 75%)  Function - Comprehension Comprehension: Auditory Comprehension assist level: Understands complex 90% of the time/cues 10% of the time  Function - Expression Expression: Verbal Expression assist level: Expresses basic 75 - 89% of the time/requires cueing 10 - 24% of the time. Needs helper to occlude trach/needs to repeat words.  Function - Social Interaction Social Interaction assist level: Interacts appropriately 90% of the time - Needs monitoring or encouragement for participation or interaction.  Function - Problem Solving Problem solving assist level: Solves complex 90% of the time/cues < 10% of the time  Function - Memory Memory assist level: Recognizes or recalls 90% of the time/requires cueing < 10% of the time Patient normally able to recall (first 3 days only): Current season, Location of own room, Staff names and faces, That he or she is in a hospital  Medical Problem List and Plan: 1.  Right hemiparesis and aphasia  secondary to L MCA infarct Cont  CIR PT,OT,SLP Team  conference today please see physician documentation under team conference tab, met with team face-to-face to discuss problems,progress, and goals. Formulized individual treatment plan based on medical history, underlying problem and comorbidities. Plan right carotid stent at end of week, need to coordinate with Dr. Estanislado Pandy 2.  DVT Prophylaxis/Anticoagulation: Pharmaceutical: Lovenox 3. Pain Management: N/A 4. Mood: On Prozac. LCSW to follow for evaluation and support.  5. Neuropsych: This patient is capable of making decisions on his own behalf. 6. Skin/Wound Care: routine pressure relief measures.  7. Fluids/Electrolytes/Nutrition: Monitor I/O. 8. NMM:HWKGSUP BP bid. Currently well controlled. Will monitor to monitor on Norvasc, HCTZ and Cozaar. Encourage compliance.controlled 4/23 Vitals:   02/27/17 1500 02/28/17 0700  BP: 120/65 116/67  Pulse: 65 97  Resp: 18 18  Temp: 98.6 F (37 C) 98 F (36.7 C)   9. Dyslipidemia: Continue lipitor.  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  11. T2DM: New diagnosis with Hgb A1C 7.0. Monitor BS ac/hs and use SSI for elevated BS. Will start patient on low dose metformin and titrate upwards as indicated. Well controlled 4/24 CBG (last 3)   Recent Labs  02/27/17 1625 02/27/17 2149 02/28/17 0705  GLUCAP 145* 129* 113*     12. Anemia: Continue to monitor. LOS (Days) 6 A FACE TO FACE EVALUATION WAS PERFORMED  KIRSTEINS,ANDREW E 02/28/2017, 10:16 AM

## 2017-02-28 NOTE — Progress Notes (Signed)
Social Work Patient ID: Erik [name], male   DOB: 04-Nov-1956, 61 y.o.   MRN: [id number]  Spoke with wife earlier regarding her concerns about having CEA prior to his discharge home. MD suppose to contact her and discuss her concerns and questions. Made aware of team conference goals mod/I level and target discharge date 4/28. Both are agreeable to this and so glad with how well he is doing in his therapies. Encouraged wife to come in tomorrow to see pt in therapies and this will answer all of her questions she has. Still wanting stenting to be done ASAP. Looks like appointment on Wed for this. Will see when here tomorrow.

## 2017-02-28 NOTE — Progress Notes (Signed)
Physical Therapy Session Note  Patient Details  Name: Erik [name] MRN: [id number] Date of Birth: 06-Apr-1956  Today's Date: 02/28/2017 PT Individual Time: 1445-1545 PT Individual Time Calculation (min): 60 min   Short Term Goals: Week 1:  PT Short Term Goal 1 (Week 1): =LTGs due to ELOS  Skilled Therapeutic Interventions/Progress Updates:    no c/o pain.  Session focus on NMR, balance, and high level gait.  Pt ambulates throughout unit with SPC with close supervision, occasional LOB requiring min guard to correct.  Dynavision x3 trials mode A on 4" foam for ankle/hip strategy with pt scoring 34 hits, 36 hits, and 41 hits.  3 trials mode A with t-scope standing on 4" foam with pt scoring 32 hits (2/12 words), 28 hits (1/12 words), and 30 hits (5/12 words).  Pt improved score on final trial with verbal cues to scan for word before scanning for light.  Stair negotiation with alternating pattern to ascend/descend focus on increased step length, pt completes set of 4 steps repeatedly until fatigued, supervision>min guard for safety.  NMR for ankle/hip strategy on rockerboard for AP weight shifts and LR weight shifts 2x30 seconds each direction progress from BUE support to no UE support.  NMR in maxisky focus on stepping strategy and high level gait with grape vines to R and L x2, with HHA progress to no UE support.  Gait back to room as above. Call bell in reach and needs met.   Therapy Documentation Precautions:  Precautions Precautions: Fall Precaution Comments: mild R inattention during functional tasks Restrictions Weight Bearing Restrictions: No   See Function Navigator for Current Functional Status.   Therapy/Group: Individual Therapy  Earnest Conroy Penven-Crew 02/28/2017, 3:10 PM

## 2017-03-01 ENCOUNTER — Inpatient Hospital Stay (HOSPITAL_COMMUNITY): Payer: Managed Care, Other (non HMO) | Admitting: *Deleted

## 2017-03-01 ENCOUNTER — Inpatient Hospital Stay (HOSPITAL_COMMUNITY): Payer: Managed Care, Other (non HMO) | Admitting: Speech Pathology

## 2017-03-01 ENCOUNTER — Inpatient Hospital Stay (HOSPITAL_COMMUNITY): Payer: Managed Care, Other (non HMO) | Admitting: Occupational Therapy

## 2017-03-01 ENCOUNTER — Inpatient Hospital Stay (HOSPITAL_COMMUNITY): Payer: Managed Care, Other (non HMO) | Admitting: Physical Therapy

## 2017-03-01 LAB — GLUCOSE, CAPILLARY
Glucose-Capillary: 99 mg/dL (ref 65–99)
Glucose-Capillary: 118 mg/dL — ABNORMAL HIGH (ref 65–99)
Glucose-Capillary: 185 mg/dL — ABNORMAL HIGH (ref 65–99)
Glucose-Capillary: 116 mg/dL — ABNORMAL HIGH (ref 65–99)

## 2017-03-01 NOTE — Progress Notes (Signed)
Physical Therapy Session Note  Patient Details  Name: Erik [name] MRN: [id number] Date of Birth: 1956-07-09  Today's Date: 03/01/2017 PT Individual Time: 1451-1530 PT Individual Time Calculation (min): 39 min   Short Term Goals: Week 1:  PT Short Term Goal 1 (Week 1): =LTGs due to ELOS  Skilled Therapeutic Interventions/Progress Updates:    no c/o pain.  Session focus on ambulation without AD and balance.  PT administered BERG Balance Scale, see below for results.  Discussed interpretation of fall risk with patient and provided recommendations for reducing risk of falling at home.  Pt verbalized understanding, but education to continue.  Gait training weaving through cones with focus on tight turns without cross stepping, pt able to complete with supervision and correct LOB with supervision only but requires mod cues to not step across midline when navigating around cones.  Pt returned to room at end of session and positioned with call bell in reach and needs met.   Therapy Documentation Precautions:  Precautions Precautions: Fall Precaution Comments: mild R inattention during functional tasks Restrictions Weight Bearing Restrictions: No Balance: Balance Balance Assessed: Yes Standardized Balance Assessment Standardized Balance Assessment: Berg Balance Test Berg Balance Test Sit to Stand: Able to stand without using hands and stabilize independently Standing Unsupported: Able to stand safely 2 minutes Sitting with Back Unsupported but Feet Supported on Floor or Stool: Able to sit safely and securely 2 minutes Stand to Sit: Sits safely with minimal use of hands Transfers: Able to transfer safely, minor use of hands Standing Unsupported with Eyes Closed: Able to stand 10 seconds safely Standing Ubsupported with Feet Together: Able to place feet together independently but unable to hold for 30 seconds From Standing, Reach Forward with Outstretched Arm: Can reach confidently >25 cm  (10") From Standing Position, Pick up Object from Floor: Able to pick up shoe safely and easily From Standing Position, Turn to Look Behind Over each Shoulder: Looks behind from both sides and weight shifts well Turn 360 Degrees: Able to turn 360 degrees safely one side only in 4 seconds or less Standing Unsupported, Alternately Place Feet on Step/Stool: Able to stand independently and complete 8 steps >20 seconds Standing Unsupported, One Foot in Front: Able to take small step independently and hold 30 seconds Standing on One Leg: Able to lift leg independently and hold 5-10 seconds Total Score: 49   See Function Navigator for Current Functional Status.   Therapy/Group: Individual Therapy  Erik [name] 03/01/2017, 3:13 PM

## 2017-03-01 NOTE — Evaluation (Signed)
Recreational Therapy Assessment and Plan  Patient Details  Name: Erik [name] MRN: [id number] Date of Birth: 01-17-1956 Today's Date: 03/01/2017  Rehab Potential:  Good ELOS:   d/c 4/26  Assessment Problem List:      Patient Active Problem List   Diagnosis Date Noted  . ICAO (internal carotid artery occlusion), left 02/22/2017  . Acute respiratory failure ([place]) 02/22/2017  . Obesity 02/22/2017  . Expressive aphasia 02/22/2017  . Acute ischemic left MCA stroke ([place]) 02/22/2017  . Left hemiparesis ([place])   . Status post total bilateral knee replacement   . Benign essential HTN   . Right-sided extracranial carotid artery stenosis   . Dysphagia, post-stroke   . Acute blood loss anemia   . Nocturia   . Newly diagnosed diabetes ([place])   . CVA (cerebral vascular accident) ([place]) - L MCA s/p tPA and thrombectomy 02/16/2017  . Anxiety 04/30/2015  . Benign fibroma of prostate 04/30/2015  . Clinical depression 04/30/2015  . Acid reflux 04/30/2015  . HLD (hyperlipidemia) 04/30/2015  . BP (high blood pressure) 04/30/2015  . Arthritis, degenerative 04/30/2015  . 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 Arthritis, septic, knee ([place]) 09/26/2012    Past Medical History:      Past Medical History:  Diagnosis Date  . Anxiety   . Arthritis    hands  . BPH (benign prostatic hyperplasia)   . ED (erectile dysfunction)   . H/O calcium pyrophosphate deposition disease (CPPD)   . HLD (hyperlipidemia)   . Hypogonadism in male   . Lower urinary tract symptoms (LUTS)   . Nocturia   . Testosterone overdose   . Vitamin D deficiency    Past Surgical History:       Past Surgical History:  Procedure Laterality Date  . BACK SURGERY    . IR ANGIO INTRA EXTRACRAN SEL COM CAROTID INNOMINATE UNI R MOD SED  02/16/2017  . IR PERCUTANEOUS ART THROMBECTOMY/INFUSION INTRACRANIAL INC DIAG ANGIO  02/16/2017  . IR PTA NON CORO-LOWER EXTREM  02/16/2017   . IR US GUIDE VASC ACCESS LEFT  02/16/2017  . IR US GUIDE VASC ACCESS RIGHT  02/16/2017  . KNEE ARTHROSCOPY Bilateral   . RADIOLOGY WITH ANESTHESIA N/A 02/16/2017   Procedure: RADIOLOGY WITH ANESTHESIA;  Surgeon: Luanne Bras, MD;  Location: [facility];  Service: Radiology;  Laterality: N/A;  . ROTATOR CUFF REPAIR Right     Assessment & Plan Clinical Impression: Patient is a 61 y.o. year old male who was admitted on 02/16/17 after being found by wife lying on the ground with inability to speak. History taken from chart review, patient, and wife. CTA head/neck revealed hyperdense L-MCA sign likely indicating acute thrombus. He received tPA and he underwent cerebral angiogram with L-MCA thrombectomy, dense calcified plaque L-ICA precluded stent placement and noted to have 73% stenosis R-ICA due to advanced atherosclerotic changes. MRI/MRA brain reviewed, showing multiple areas of infarct. Per report, multiple areas of acute or early ischemia within left supratentorial hemisphere--largest affecting left basal and temporal lobe with cytotoxic edema, petechial hemorrhage in left posterior temporal and peripheral left basal ganglia.  Patient transferred to CIR on 02/22/2017.    Met with pt today to discuss leisure interests and provide education on activity analysis with potential modifications, & community reintegration.  Pt states he is anxious to return home and is able to participate in previously enjoyed activities with family with minimal modifications.     Plan  No further TR as pt is  discharging 4/26. The above assessment, treatment plan, treatment alternatives and goals were discussed and mutually ag reed upon: by patient  [place] 03/01/2017, 11:42 AM

## 2017-03-01 NOTE — Progress Notes (Signed)
Speech Language Pathology Daily Session Note  Patient Details  Name: KORBEN CARCIONE MRN: [id number] Date of Birth: May 07, 1956  Today's Date: 03/01/2017 SLP Individual Time: 0730-0800 SLP Individual Time Calculation (min): 30 min  Short Term Goals: Week 1: SLP Short Term Goal 1 (Week 1): Patient will utilize speech intelligibility strategies at the sentence level to maximzie intelligibility to 90% with supervision verbal cues.  SLP Short Term Goal 2 (Week 1): Patient will self-monitor and correct articulatory errors with supervision verbal cues.  SLP Short Term Goal 3 (Week 1): Patient will decode at the word and phrase level with 75% accuracy and Min A multimodal cues.  SLP Short Term Goal 4 (Week 1): Patient will utilize word-finding strategies at the sentence level with Supervision verbal cues.  SLP Short Term Goal 5 (Week 1): Patient will recall new, daily events with use of external aids with supervision verbal cues.   Skilled Therapeutic Interventions: Skilled treatment session focused on speech and language goals. Patient read at the word level with 100% accuracy and verbal cue X 1. Patient also participated in a verbal description task with supervision verbal cues needed for use of speech intelligibility strategies and word-finding to achieve 100% intelligibility. Patient left upright in recliner with all needs within reach. Continue with current plan of care.       Function:   Cognition Comprehension Comprehension assist level: Understands complex 90% of the time/cues 10% of the time  Expression   Expression assist level: Expresses complex 90% of the time/cues < 10% of the time  Social Interaction Social Interaction assist level: Interacts appropriately 90% of the time - Needs monitoring or encouragement for participation or interaction.  Problem Solving Problem solving assist level: Solves complex 90% of the time/cues < 10% of the time  Memory Memory assist level: Recognizes or  recalls 90% of the time/requires cueing < 10% of the time    Pain No/Denies Pain   Therapy/Group: Individual Therapy  Ithiel Liebler 03/01/2017, 3:36 PM

## 2017-03-01 NOTE — Progress Notes (Signed)
Physical Therapy Session Note  Patient Details  Name: Erik [name] MRN: [id number] Date of Birth: [birth year]  Today's Date: 03/01/2017 PT Individual Time: 0900-0956 PT Individual Time Calculation (min): 56 min   Short Term Goals: Week 1:  PT Short Term Goal 1 (Week 1): =LTGs due to ELOS  Skilled Therapeutic Interventions/Progress Updates:    no c/o pain.  Session focus on community level ambulation without device, and NMR for balance re-training.    Pt ambulates to and from gift shop on third floor without device, supervision, with min cues for R step length and for balance strategies.  Gait through gift shop focus on turning and maneuvering in small spaces with supervision, fade to mod I.  Pt able to find path from gift shop back to main therapy gym without cues.    Standing ball toss focus on anticipatory postural adjustments and use of BUEs in a task.  Standing alternating toe taps to 4" box focus on balance with weight shifts, close supervision with occasional steady assist with LOB.  Pt continues to have difficulty clearing RLE, 2/2 decreased coordination and fear of falling.  Gait training kicking yoga block x100' focus on RLE heel strike with supervision and occasional min guard for pt to regain balance.  Pt completed un-timed 4-square step test x2 with min guard and mod cues for clearing the poles marking each quadrant.  Pt continues to demo decreased coordination with RLE during ambulation and occasional scissoring with LOB to R.  Pt able to correct with supervision verbal cues for pacing, but with little carryover noted session to session.  Pt returned to room at end of session and positioned in recliner with call bell in reach and needs met.   Therapy Documentation Precautions:  Precautions Precautions: Fall Precaution Comments: mild R inattention during functional tasks Restrictions Weight Bearing Restrictions: No   See Function Navigator for Current Functional  Status.   Therapy/Group: Individual Therapy  Earnest Conroy [place] 03/01/2017, 9:59 AM

## 2017-03-01 NOTE — Progress Notes (Signed)
Subjective/Complaints: Discussed results of care team meeting. We also discussed my conversations with interventional radiology.   ROS-  Denies CP,SOB, N/V/D  Objective: Vital Signs: Blood pressure (!) 114/56, pulse (!) 57, temperature 97.8 F (36.6 C), temperature source Oral, resp. rate 17, height _0  (1.803 m), weight 94.7 kg (208 lb 12.4 oz), SpO2 99 %. No results found. Results for orders placed or performed during the hospital encounter of 02/22/17 (from the past 72 hour(s))  Glucose, capillary     Status: Abnormal   Collection Time: 02/26/17  7:00 AM  Result Value Ref Range   Glucose-Capillary 105 (H) 65 - 99 mg/dL  Glucose, capillary     Status: Abnormal   Collection Time: 02/26/17 11:42 AM  Result Value Ref Range   Glucose-Capillary 142 (H) 65 - 99 mg/dL  Glucose, capillary     Status: Abnormal   Collection Time: 02/26/17  4:43 PM  Result Value Ref Range   Glucose-Capillary 145 (H) 65 - 99 mg/dL  Glucose, capillary     Status: Abnormal   Collection Time: 02/26/17  9:12 PM  Result Value Ref Range   Glucose-Capillary 138 (H) 65 - 99 mg/dL  Glucose, capillary     Status: Abnormal   Collection Time: 02/27/17  6:27 AM  Result Value Ref Range   Glucose-Capillary 128 (H) 65 - 99 mg/dL  Glucose, capillary     Status: Abnormal   Collection Time: 02/27/17 12:01 PM  Result Value Ref Range   Glucose-Capillary 112 (H) 65 - 99 mg/dL  Glucose, capillary     Status: Abnormal   Collection Time: 02/27/17  4:25 PM  Result Value Ref Range   Glucose-Capillary 145 (H) 65 - 99 mg/dL  Glucose, capillary     Status: Abnormal   Collection Time: 02/27/17  9:49 PM  Result Value Ref Range   Glucose-Capillary 129 (H) 65 - 99 mg/dL  Basic metabolic panel     Status: Abnormal   Collection Time: 02/28/17  5:08 AM  Result Value Ref Range   Sodium 136 135 - 145 mmol/L   Potassium 3.9 3.5 - 5.1 mmol/L   Chloride 100 (L) 101 - 111 mmol/L   CO2 28 22 - 32 mmol/L   Glucose, Bld 126 (H) 65 -  99 mg/dL   BUN 21 (H) 6 - 20 mg/dL   Creatinine, Ser 0.95 0.61 - 1.24 mg/dL   Calcium 9.6 8.9 - 10.3 mg/dL   GFR calc non Af Amer >60 >60 mL/min   GFR calc Af Amer >60 >60 mL/min    Comment: (NOTE) The eGFR has been calculated using the CKD EPI equation. This calculation has not been validated in all clinical situations. eGFR's persistently <60 mL/min signify possible Chronic Kidney Disease.    Anion gap 8 5 - 15  CBC     Status: Abnormal   Collection Time: 02/28/17  5:08 AM  Result Value Ref Range   WBC 5.1 4.0 - 10.5 K/uL   RBC 3.63 (L) 4.22 - 5.81 MIL/uL   Hemoglobin 11.3 (L) 13.0 - 17.0 g/dL   HCT 33.3 (L) 39.0 - 52.0 %   MCV 91.7 78.0 - 100.0 fL   MCH 31.1 26.0 - 34.0 pg   MCHC 33.9 30.0 - 36.0 g/dL   RDW 13.0 11.5 - 15.5 %   Platelets 249 150 - 400 K/uL  Glucose, capillary     Status: Abnormal   Collection Time: 02/28/17  7:05 AM  Result Value Ref Range  Glucose-Capillary 113 (H) 65 - 99 mg/dL  Glucose, capillary     Status: Abnormal   Collection Time: 02/28/17 11:40 AM  Result Value Ref Range   Glucose-Capillary 147 (H) 65 - 99 mg/dL  Glucose, capillary     Status: Abnormal   Collection Time: 02/28/17  4:51 PM  Result Value Ref Range   Glucose-Capillary 110 (H) 65 - 99 mg/dL  Glucose, capillary     Status: Abnormal   Collection Time: 02/28/17  8:33 PM  Result Value Ref Range   Glucose-Capillary 141 (H) 65 - 99 mg/dL   Comment 1 Notify RN    Comment 2 Document in Chart   Glucose, capillary     Status: None   Collection Time: 03/01/17  6:19 AM  Result Value Ref Range   Glucose-Capillary 99 65 - 99 mg/dL   Comment 1 Notify RN    Comment 2 Document in Chart      HEENT: normal Cardio: RRR and no murmur Resp: CTA B/L and unlabored GI: BS positive and NT, ND Extremity:  No Edema Skin:   Intact Neuro: Cranial Nerve Abnormalities R central VII, Normal Sensory, Abnormal Motor 4/5 in RUE adn RLE, 5/5 on left side, Abnormal FMC Ataxic/ dec FMC, Dysarthric and  Aphasic Musc/Skel:  Normal Gen NAD   Assessment/Plan: 1. Functional deficits secondary to Left MCA infarct which require 3+ hours per day of interdisciplinary therapy in a comprehensive inpatient rehab setting. Physiatrist is providing close team supervision and 24 hour management of active medical problems listed below. Physiatrist and rehab team continue to assess barriers to discharge/monitor patient progress toward functional and medical goals. FIM: Function - Bathing Position: Shower Body parts bathed by patient: Right arm, Left arm, Chest, Abdomen, Front perineal area, Buttocks, Right upper leg, Left upper leg, Right lower leg, Left lower leg Body parts bathed by helper: Back Assist Level: Touching or steadying assistance(Pt > 75%)  Function- Upper Body Dressing/Undressing What is the patient wearing?: Pull over shirt/dress Pull over shirt/dress - Perfomed by patient: Thread/unthread right sleeve, Thread/unthread left sleeve, Put head through opening, Pull shirt over trunk Pull over shirt/dress - Perfomed by helper: Pull shirt over trunk Assist Level: Supervision or verbal cues Function - Lower Body Dressing/Undressing What is the patient wearing?: Pants, Socks, Shoes Position: Sitting EOB Underwear - Performed by patient: Thread/unthread right underwear leg, Thread/unthread left underwear leg Underwear - Performed by helper: Pull underwear up/down Pants- Performed by patient: Thread/unthread right pants leg, Thread/unthread left pants leg, Pull pants up/down Pants- Performed by helper: Pull pants up/down Socks - Performed by patient: Don/doff right sock, Don/doff left sock Shoes - Performed by patient: Don/doff right shoe, Don/doff left shoe Shoes - Performed by helper: Fasten left, Fasten right Assist for footwear: Supervision/touching assist Assist for lower body dressing: Supervision or verbal cues  Function - Toileting Toileting steps completed by patient: Adjust  clothing prior to toileting, Performs perineal hygiene, Adjust clothing after toileting Toileting Assistive Devices: Grab bar or rail Assist level: Touching or steadying assistance (Pt.75%)  Function - Toilet Transfers Toilet transfer assistive device: Walker Assist level to toilet: Touching or steadying assistance (Pt > 75%) Assist level from toilet: Touching or steadying assistance (Pt > 75%)  Function - Chair/bed transfer Chair/bed transfer method: Ambulatory Chair/bed transfer assist level: Supervision or verbal cues Chair/bed transfer assistive device: Armrests, Cane Chair/bed transfer details: Verbal cues for precautions/safety, Verbal cues for technique  Function - Locomotion: Wheelchair Will patient use wheelchair at discharge?: No Wheelchair activity  did not occur: N/A (pt ambulatory on unit) Function - Locomotion: Ambulation Assistive device: Cane-quad Max distance: 200 Assist level: Supervision or verbal cues Assist level: Supervision or verbal cues Assist level: Supervision or verbal cues Assist level: Supervision or verbal cues Assist level: Touching or steadying assistance (Pt > 75%)  Function - Comprehension Comprehension: Auditory Comprehension assist level: Understands complex 90% of the time/cues 10% of the time  Function - Expression Expression: Verbal Expression assist level: Expresses basic 75 - 89% of the time/requires cueing 10 - 24% of the time. Needs helper to occlude trach/needs to repeat words.  Function - Social Interaction Social Interaction assist level: Interacts appropriately 90% of the time - Needs monitoring or encouragement for participation or interaction.  Function - Problem Solving Problem solving assist level: Solves complex 90% of the time/cues < 10% of the time  Function - Memory Memory assist level: Recognizes or recalls 90% of the time/requires cueing < 10% of the time Patient normally able to recall (first 3 days only): Current  season, Location of own room, Staff names and faces, That he or she is in a hospital  Medical Problem List and Plan: 1.  Right hemiparesis and aphasia  secondary to L MCA infarct Cont  CIR PT,OT,SLP  Discussed with IR coverage, Dr Earleen Newport, who states the R carotid stent is elective and not emergent, plan is home with OP f/u with Dr Estanislado Pandy 2.  DVT Prophylaxis/Anticoagulation: Pharmaceutical: Lovenox 3. Pain Management: N/A 4. Mood: On Prozac. LCSW to follow for evaluation and support.  5. Neuropsych: This patient is capable of making decisions on his own behalf. 6. Skin/Wound Care: routine pressure relief measures.  7. Fluids/Electrolytes/Nutrition: Monitor I/O. 8. VKP:QAESLPN BP bid. Currently well controlled. Will monitor to monitor on Norvasc, HCTZ and Cozaar. Encourage compliance.controlled 4/26 Vitals:   02/28/17 1445 03/01/17 0441  BP: 131/64 (!) 114/56  Pulse: 65 (!) 57  Resp: 17 17  Temp: 98.3 F (36.8 C) 97.8 F (36.6 C)   9. Dyslipidemia: Continue lipitor.  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  11. T2DM: New diagnosis with Hgb A1C 7.0. Monitor BS ac/hs and use SSI for elevated BS. Will start patient on low dose metformin and titrate upwards as indicated. Well controlled 4/26 CBG (last 3)   Recent Labs  02/28/17 1651 02/28/17 2033 03/01/17 0619  GLUCAP 110* 141* 99     12. Anemia: Continue to monitor. LOS (Days) 7 A FACE TO FACE EVALUATION WAS PERFORMED  Awa Bachicha E 03/01/2017, 6:33 AM

## 2017-03-01 NOTE — Progress Notes (Signed)
Occupational Therapy Session Note  Patient Details  Name: Erik [name] MRN: [id number] Date of Birth: 05/26/1956  Today's Date: 03/01/2017 OT Individual Time: 1102-1200 OT Individual Time Calculation (min): 58 min    Short Term Goals: Week 1:  OT Short Term Goal 1 (Week 1): LTG=STG 2/2 estimated LOS  Skilled Therapeutic Interventions/Progress Updates:    OT treatment session focused on R NMR, R fine-motor coordination, R UE strengthening, B UE coordination, and dynamic standing balance. Pt ambulated w/ SPC and close supervision/intermittent min guard A for LOB. B UE coordination and UE strengthening with shuttle pass and graded ball pass activity. Pt then brought into quadruped position for weight bearing, and fine motor activity w/ alternating UE peg board. Dynamic balance with step through and R UE coordination using horse shoe toss activity. Pt returned to room at end of session and left seated in recliner with needs met.   Therapy Documentation Precautions:  Precautions Precautions: Fall Precaution Comments: mild R inattention during functional tasks Restrictions Weight Bearing Restrictions: No Pain:  denies pain ADL: ADL ADL Comments: Please see functional navigator  See Function Navigator for Current Functional Status.   Therapy/Group: Individual Therapy  Elisabeth S Doe 03/01/2017, 11:48 AM 

## 2017-03-02 ENCOUNTER — Inpatient Hospital Stay (HOSPITAL_COMMUNITY): Payer: Managed Care, Other (non HMO) | Admitting: *Deleted

## 2017-03-02 ENCOUNTER — Inpatient Hospital Stay (HOSPITAL_COMMUNITY): Payer: Managed Care, Other (non HMO) | Admitting: Occupational Therapy

## 2017-03-02 ENCOUNTER — Inpatient Hospital Stay (HOSPITAL_COMMUNITY): Payer: Managed Care, Other (non HMO) | Admitting: Speech Pathology

## 2017-03-02 ENCOUNTER — Inpatient Hospital Stay (HOSPITAL_COMMUNITY): Payer: Managed Care, Other (non HMO) | Admitting: Physical Therapy

## 2017-03-02 LAB — GLUCOSE, CAPILLARY
GLUCOSE-CAPILLARY: 114 mg/dL — AB (ref 65–99)
Glucose-Capillary: 136 mg/dL — ABNORMAL HIGH (ref 65–99)
Glucose-Capillary: 166 mg/dL — ABNORMAL HIGH (ref 65–99)

## 2017-03-02 MED ORDER — PANTOPRAZOLE SODIUM 40 MG PO TBEC: 40.0000 mg | DELAYED_RELEASE_TABLET | Freq: Every day | ORAL | 0 refills | Status: AC

## 2017-03-02 MED ORDER — FLUOXETINE HCL 20 MG PO CAPS: 20.0000 mg | ORAL_CAPSULE | Freq: Every day | ORAL | 0 refills | Status: AC

## 2017-03-02 MED ORDER — AMLODIPINE BESYLATE 10 MG PO TABS: 10.0000 mg | ORAL_TABLET | Freq: Every day | ORAL | 0 refills | Status: DC

## 2017-03-02 MED ORDER — MELATONIN 3 MG PO TABS: 3.0000 mg | ORAL_TABLET | Freq: Every evening | ORAL | 0 refills | Status: AC | PRN

## 2017-03-02 MED ORDER — CRESTOR 40 MG PO TABS: 40.0000 mg | ORAL_TABLET | Freq: Every day | ORAL | 0 refills | Status: DC

## 2017-03-02 MED ORDER — METFORMIN HCL 500 MG PO TABS: 500.0000 mg | ORAL_TABLET | Freq: Every day | ORAL | 0 refills | Status: AC

## 2017-03-02 MED ORDER — CLOPIDOGREL BISULFATE 75 MG PO TABS: 75.0000 mg | ORAL_TABLET | Freq: Every day | ORAL | 0 refills | Status: DC

## 2017-03-02 MED ORDER — LOSARTAN POTASSIUM 25 MG PO TABS: 25.0000 mg | ORAL_TABLET | Freq: Every day | ORAL | 0 refills | Status: DC

## 2017-03-02 MED ORDER — TAMSULOSIN HCL 0.4 MG PO CAPS: 0.4000 mg | ORAL_CAPSULE | Freq: Every day | ORAL | 0 refills | Status: AC

## 2017-03-02 MED ORDER — HYDROCHLOROTHIAZIDE 25 MG PO TABS: 25.0000 mg | ORAL_TABLET | Freq: Every day | ORAL | 0 refills | Status: AC

## 2017-03-02 NOTE — Progress Notes (Signed)
Social Work Patient ID: Erik [name], male   DOB: 1956/08/22, 61 y.o.   MRN: [id number]  Pt wanted to be discharged today after therapies instead of tomorrow. Checked with MD and PA agreeable to this plan. Wife is here and has Gone through his therapies with him today. She feels comfortable with discharge being today. Pt's medical procedure is scheduled for Wed. Trying to move around his OP therapies to Friday due to may not Be ready on Thursday to go after procedure. Wife is aware of this. Have ordered DME will deliver today. Work on discharge today after afternoon therapies.

## 2017-03-02 NOTE — Progress Notes (Signed)
Pt discharged to home this afternoon with all belongings at bedside. Pt discharged with wife.

## 2017-03-02 NOTE — Discharge Summary (Signed)
Physician Discharge Summary  Patient ID: Erik [name] MRN: [id number] DOB/AGE: 62-Jan-1957 62 y.o.  Admit date: 02/22/2017 Discharge date: 03/02/2017  Discharge Diagnoses:  Principal Problem:   Acute ischemic left MCA stroke [facility]) Active Problems:   Acid reflux   Benign essential HTN   Newly diagnosed diabetes (HCC)   Left hemiparesis (HCC)   Language impairment   Discharged Condition:  Stable   Significant Diagnostic Studies: N/A   Labs:  Basic Metabolic Panel: BMP Latest Ref Rng & Units 02/28/2017 02/22/2017 02/19/2017  Glucose 65 - 99 mg/dL 478(G) 956(O) 130(Q)  BUN 6 - 20 mg/dL 65(H) 84(O) 15  Creatinine 0.61 - 1.24 mg/dL 9.62 9.52 8.41  Sodium 135 - 145 mmol/L 136 136 139  Potassium 3.5 - 5.1 mmol/L 3.9 3.9 3.9  Chloride 101 - 111 mmol/L 100(L) 100(L) 102  CO2 22 - 32 mmol/L Calcium 8.9 - 10.3 mg/dL 9.6 9.9 9.2    CBC: CBC Latest Ref Rng & Units 02/28/2017 02/22/2017 02/21/2017  WBC 4.0 - 10.5 K/uL 5.1 6.1 5.9  Hemoglobin 13.0 - 17.0 g/dL 11.3(L) 13.4 12.4(L)  Hematocrit 39.0 - 52.0 % 33.3(L) 38.5(L) 35.7(L)  Platelets 150 - 400 K/uL 249 206 195    CBG:  Recent Labs Lab 03/01/17 1651 03/01/17 2041 03/02/17 0632 03/02/17 1222 03/02/17 1534  GLUCAP 185* 116* 114* 136* 166*    Brief HPI:   Erik [name] is a 61 y.o. male with history of BPH, OA s/p B-TKR , HTN (wife/patient deny) who was admitted on 02/16/17 after being found by wife lying on the ground with inability to speak. CTA head/neck revealed hyperdense L-MCA sign likely indicating acute thrombus. He received tPA  and he underwent cerebral angiogram with L-MCA thrombectomy, dense calcified plaque L-ICA precluded stent placement and noted to have 73% stenosis R-ICA due to advanced atherosclerotic changes. MRI/MRA brain reviewed, showing multiple areas of infarct. Carotid dopplers with right severe mixed plaque origin and proximal ICA/ECA 80-99%. He was started on DAPT for thromboembolic stroke due  to large vessel disease and to plan for elective surgery post-rehab.  Patient with resultant facial weakness with mild dysarthria, aphasia, mild left gaze preference and right sided weakness affecting mobility and ability to carry out ADL tasks. CIR was recommended for follow up therpay.     Hospital Course: Erik [name] was admitted to rehab 02/22/2017 for inpatient therapies to consist of PT, ST and OT at least three hours five days a week. Past admission physiatrist, therapy team and rehab RN have worked together to provide customized collaborative inpatient rehab. Blood pressures were monitored on bid basis and has been controlled on current regimen. He was started on low dose metformin due to new diagnosis of T2DM and RD was consulted for diet education. Po intake has been good and he is continent of bowel and bladder. No signs of sever anxiety or depression noted per neuropsychology evaluation. He has made good progress during his rehab stay and is modified independent at discharge. He will continue to receive follow up outpatient PT, OT and ST at [facility] after discharge.   During patient's stay in rehab team conference was held to monitor patient's progress, set goals and discuss barriers to discharge. He demonstrated mild cognitive impairments with mild word finding deficits and moderate dysarthria with 75% speech intelligibility. He required min assist for mobility and basic ADL tasks. He has had improvement in activity tolerance, balance, visual scanning and ability to compensate for deficits. He  is has had improvement in functional use RUE and RLE. He is able to complete ADL tasks with at modified independent level. He is independent for transfers and to ambulate 200' X 2 in controlled setting without AD. Gait is marked by occasional decrease in foot clearance on right. He was advised to use cane when out of home setting. He requires min assist to utilize strategies for speech intelligibility and  supervision for word finding deficits. Family education was completed with wife regarding all aspects of mobility and strategies for speech.     Disposition: home  Diet: Heart healthy/Diabetic diet  Special Instructions: 1. No driving or strenuous activity till cleared by MD.  2. Carotid stent set by Dr. Corliss Skains on May 2nd at 9:30 am.    Allergies as of 03/02/2017      Reactions   Celecoxib Nausea And Vomiting   Shellfish-derived Products Hives      Medication List    STOP taking these medications   heparin 5000 UNIT/ML injection   insulin aspart 100 UNIT/ML injection Commonly known as:  novoLOG   omeprazole 40 MG capsule Commonly known as:  PRILOSEC     TAKE these medications   acetaminophen 325 MG tablet Commonly known as:  TYLENOL Take 2 tablets (650 mg total) by mouth every 4 (four) hours as needed for mild pain.   amLODipine 10 MG tablet Commonly known as:  NORVASC Take 1 tablet (10 mg total) by mouth daily.   aspirin 325 MG EC tablet Take 1 tablet (325 mg total) by mouth daily.   clopidogrel 75 MG tablet Commonly known as:  PLAVIX Take 1 tablet (75 mg total) by mouth daily.   CRESTOR 40 MG tablet Generic drug:  rosuvastatin Take 1 tablet (40 mg total) by mouth daily. What changed:  See the new instructions.   FLUoxetine 20 MG capsule Commonly known as:  PROZAC Take 1 capsule (20 mg total) by mouth daily.   hydrochlorothiazide 25 MG tablet Commonly known as:  HYDRODIURIL Take 1 tablet (25 mg total) by mouth daily. What changed:  See the new instructions.   losartan 25 MG tablet Commonly known as:  COZAAR Take 1 tablet (25 mg total) by mouth daily. What changed:  See the new instructions.   Melatonin 3 MG Tabs Take 1 tablet (3 mg total) by mouth at bedtime as needed (sleep).   metFORMIN 500 MG tablet Commonly known as:  GLUCOPHAGE Take 1 tablet (500 mg total) by mouth daily with breakfast. Start taking on:  03/03/2017   multivitamin  tablet Take 1 tablet by mouth daily.   pantoprazole 40 MG tablet Commonly known as:  PROTONIX Take 1 tablet (40 mg total) by mouth at bedtime.   tamsulosin 0.4 MG Caps capsule Commonly known as:  FLOMAX Take 1 capsule (0.4 mg total) by mouth daily after supper. What changed:  when to take this      Follow-up Information    Erick Colace, MD Follow up.   Specialty:  Physical Medicine and Rehabilitation Why:  office will call you with follow up appointment Contact information: [address] [place] [address] [phone]        Oneal Grout, MD Follow up.   Specialty:  Interventional Radiology Contact information: [address] [place] [address] [phone]        SETHI,PRAMOD, MD. Call in 1 day(s).   Specialties:  Neurology, Radiology Why:  for follow up appointment in 4-6 weeks Contact  information: [address] [place] [address] [phone]        Mick Sell, MD Follow up on 03/09/2017.   Specialty:  Infectious Diseases Why:  Appointment @ 10:00 Am Contact information: [address] [place] [address] [phone]           Signed: Jacquelynn Cree 03/02/2017, 5:31 PM

## 2017-03-02 NOTE — Progress Notes (Signed)
Social Work  Discharge Note  The overall goal for the admission was met for:   Discharge location: [place]  Length of Stay: Yes-8 DAYS  Discharge activity level: Yes-MOD/I LEVEL  Home/community participation: Yes  Services provided included: MD, RD, PT, OT, SLP, RN, CM, TR, Pharmacy, Neuropsych and SW  Financial Services: Private Insurance: [place]  Follow-up services arranged: Outpatient: [place] 8:40-11:45 AM, DME: [facility] and Patient/Family has no preference for HH/DME agencies  Comments (or additional information):WENDY WAS HERE FOR TRAINING AND BOTH FEEL HE IS DOING WELL AND READY FOR DC ASKED TO LEAVE ONE DAY EARLIER DUE TO PROGRESS. STENT PROCEDURE ON WED 5/2. WIFE HANDLING HIS STD AND FMLA FORMS.  Patient/Family verbalized understanding of follow-up arrangements: Yes  Individual responsible for coordination of the follow-up plan: SELF & WENDY-WIFE  Confirmed correct DME delivered: Elease Hashimoto 03/02/2017    Elease Hashimoto

## 2017-03-02 NOTE — Progress Notes (Addendum)
Occupational Therapy Discharge Summary  Patient Details  Name: Erik [name] MRN: [id number] Date of Birth: 10/05/1956  Today's Date: 03/02/2017  Session 1 OT Individual Time: 2800-3491 OT Individual Time Calculation (min): 46 min   Session 2 OT Individual Time: 7915-0569 OT Individual Time Calculation (min): 25 min   Patient has met 11 of 11 long term goals due to improved activity tolerance, improved balance, postural control, ability to compensate for deficits, functional use of  RIGHT upper extremity, improved attention, improved awareness and improved coordination.  Patient to discharge at overall Modified Independent level.  Patient's care partner is independent to provide the necessary physical assistance at discharge for higher level iADL tasks.    Session 1 OT treatment session focused on pt/family ed, shower transfers, R fine-motor coordination, R hand strength, and R NMR. Pt ambulated to therapy apartment with spouse present. Educated on simulated walk-in shower transfer with shower chair and small ledge. After OT demonstration pt able to negotiate ledge without LOB. Graded peg board activity, and graded clothes pin task focused on lateral pinch, pincer grasp,  in-hand manipulation, rotation, shift, and translation. Facilitation for normal movement patterns of shoulder, elbow, wrist, hand. Reviewed handout for home fine motor program, then pt returned to room and left with needs met.   Session 2 Treatment session focused on B UE coordination, functional use of R UE, visual scanning, and standing balance strategies using Dyn[employer]ision. Alternating B UE and reaching across midline to facilitate trunk rotation with VC for R pointer finger isolation. Graded activity by adding foam platform to engage ankle/hip balance strategies when reaching outside base of support. Pt returned to room at end of session with needs met.    Reasons goals not met: n/a  Recommendation:  Patient will benefit  from ongoing skilled OT services in outpatient setting to continue to advance functional skills in the area of BADL, IADL and functional use of R UE.  Equipment: No equipment provided  Reasons for discharge: treatment goals met and discharge from hospital  Patient/family agrees with progress made and goals achieved: Yes  OT Discharge Precautions/Restrictions  Precautions Precautions: Fall Restrictions Weight Bearing Restrictions: No Pain Pain Assessment Pain Assessment: No/denies pain Pain Score: 0-No pain ADL ADL Eating: Modified independent Grooming: Modified independent Upper Body Bathing: Modified independent Lower Body Bathing: Modified independent Upper Body Dressing: Independent Lower Body Dressing: Modified independent Toileting: Modified independent Toilet Transfer: Modified independent Toilet Transfer Method: Magazine features editor: Modified independent Social research officer, government Method: Heritage manager: Civil engineer, contracting with back ADL Comments: Please see functional navigator Vision/Perception  Perception Perception: Within Functional Limits Praxis Praxis: Intact  Cognition Arousal/Alertness: Awake/alert Orientation Level: Oriented X4 Safety/Judgment: Appears intact Sensation Sensation Light Touch: Appears Intact Proprioception: Appears Intact Coordination Gross Motor Movements are Fluid and Coordinated: Yes Fine Motor Movements are Fluid and Coordinated: No (Improved R fine motor coordination) Mobility  Transfers Sit to Stand: 6: Modified independent (Device/Increase time) Stand to Sit: 6: Modified independent (Device/Increase time)   Balance Dynamic Standing Balance Dynamic Standing - Level of Assistance: 6: Modified independent (Device/Increase time) Extremity/Trunk Assessment RUE Assessment RUE Assessment: Exceptions to [facility] RUE Strength RUE Overall Strength Comments: Continues to have some distal weakness noted in grip  strength, but greatly improved from eval LUE Assessment LUE Assessment: Within Functional Limits   See Function Navigator for Current Functional Status.  Daneen Schick Maxamilian Amadon 03/02/2017, 3:16 PM

## 2017-03-02 NOTE — Progress Notes (Signed)
Speech Language Pathology Discharge Summary  Patient Details  Name: Erik [name] MRN: [id number] Date of Birth: [birth year]  Today's Date: 03/02/2017 SLP Individual Time: 1335-1400 SLP Individual Time Calculation (min): 25 min   Skilled Therapeutic Interventions:  Skilled treatment session focused on completion of patient and family education. Patient and his wife educated in regards to current speech and language deficits and strategies to utilize at home to maximize intelligibility, decoding and word-finding. Handouts were also given to reinforce information. Both verbalized understanding of information. Patient left upright in recliner with wife present. Continue with current plan of care.   Patient has met 5 of 5 long term goals.  Patient to discharge at [facility] level.   Reasons goals not met: N/A   Clinical Impression/Discharge Summary: Patient has made excellent gains and has met 5 of 5 LTG's this admission. Currently, patient requires overall Min A for utilization of speech intelligibility strategies at the sentence level, use of strategies while decoding at the sentence level and supervision verbal cues for word-finding at the conversation level. Patient and family education is complete and patient will discharge home with supervision from family. Patient would benefit from f/u SLP services to maximize his speech and language function and overall functional independence.   Care Partner:  Caregiver Able to Provide Assistance: Yes  Type of Caregiver Assistance: Physical;Cognitive  Recommendation:  Outpatient SLP (Intermittent supervision )  Rationale for SLP Follow Up: Reduce caregiver burden;Maximize cognitive function and independence;Maximize functional communication   Equipment: N/A   Reasons for discharge: Discharged from hospital;Treatment goals met   Patient/Family Agrees with Progress Made and Goals Achieved: Yes   Function:  Cognition Comprehension Comprehension  assist level: Understands complex 90% of the time/cues 10% of the time  Expression   Expression assist level: Expresses complex 90% of the time/cues < 10% of the time  Social Interaction Social Interaction assist level: Interacts appropriately 90% of the time - Needs monitoring or encouragement for participation or interaction.  Problem Solving Problem solving assist level: Solves complex 90% of the time/cues < 10% of the time  Memory Memory assist level: Recognizes or recalls 90% of the time/requires cueing < 10% of the time   Myrth Dahan 03/02/2017, 3:49 PM

## 2017-03-02 NOTE — Progress Notes (Signed)
Physical Therapy Session Note  Patient Details  Name: Erik [name] MRN: [id number] Date of Birth: [birth year]  Today's Date: 03/02/2017 PT Individual Time: 1032-1059 PT Individual Time Calculation (min): 27 min   Short Term Goals: Week 1:  PT Short Term Goal 1 (Week 1): =LTGs due to ELOS  Skilled Therapeutic Interventions/Progress Updates:    Tx focused on family training, NMR and functional mobility in apartment settting. Pt up in chair with spouse present upon arrival, no c/o pain.  Gait in controlled setting, apartment, and day room 3x200' with towel in RUE for increased NMR challenge. Ccues for spouse to stay on pt's R for safety. She noted concern of pt coming very close to colliding with obstacles and they were educated on best practices for safe mobility during turns and tight spaces Gait marked by occasional decreased foot clearance on R, especially in busy settings. Mobility in apartment with bed mobility, furniture transfers Mod I and gait on carpet without device.  NMR training for sit<>stands with R forced use 2x10  On firm ground and with RLE on 2" step. Discussed functional implications and stroke recovery.   Therapy Documentation Precautions:  Precautions Precautions: Fall Precaution Comments: mild R inattention during functional tasks Restrictions Weight Bearing Restrictions: No General:   Vital Signs:   Other Treatments:    Pain: none  See Function Navigator for Current Functional Status.   Therapy/Group: Individual Therapy  Yolandra Habig, Chrisandra Netters, PT, DPT  03/02/2017, 10:44 AM

## 2017-03-02 NOTE — Progress Notes (Signed)
Physical Therapy Discharge Summary  Patient Details  Name: Erik [name] MRN: [id number] Date of Birth: Aug 17, 1956  Today's Date: 03/02/2017 PT Individual Time: 0900-1000 PT Individual Time Calculation (min): 60 min    Patient has met 8 of 8 long term goals due to improved activity tolerance, improved balance, improved postural control, ability to compensate for deficits, functional use of  right upper extremity and right lower extremity, improved attention, improved awareness and improved coordination.  Patient to discharge at an ambulatory level Modified Independent.     Recommendation:  Patient will benefit from ongoing skilled PT services in outpatient setting to continue to advance safe functional mobility, address ongoing impairments in coordination, postural control, and balance strategies, and minimize fall risk.  Equipment: SPC  Reasons for discharge: treatment goals met  Patient/family agrees with progress made and goals achieved: Yes   Skilled PT Intervention: No c/o pain.  Pt's wife present to observe therapies today.  Session focus on family education regarding pt progress and continued expected progression with outpatient therapy.  Pt ambulates throughout unit, up/down incline, on compliant surfaces, and up/down stairs, mod I, occasional decreased R foot clearance, especially when fatigued or when in crowded environments.  PT discussed implications of decreased balance strategies in busy environments with pt and wife and suggested use of SPC in community setting for safety.  PT instructed pt in falls recovery and pt demonstrated floor transfer mod I.  Pt returned to room at end of session and positioned with call bell in reach and needs met.   PT Discharge Precautions/Restrictions Precautions Precautions: Fall Restrictions Weight Bearing Restrictions: No Pain Pain Assessment Pain Assessment: No/denies pain Vision/Perception  Vision - History Baseline Vision: Wears  glasses only for reading Patient Visual Report: No change from baseline Perception Perception: Within Functional Limits Praxis Praxis: Intact  Cognition Arousal/Alertness: Awake/alert Orientation Level: Oriented X4 Attention: Selective Sustained Attention: Appears intact Selective Attention: Impaired Awareness: Appears intact Problem Solving: Appears intact Safety/Judgment: Appears intact Sensation Sensation Light Touch: Appears Intact Coordination Gross Motor Movements are Fluid and Coordinated: Yes Fine Motor Movements are Fluid and Coordinated: Yes Motor  Motor Motor: Within Functional Limits  Mobility Bed Mobility Bed Mobility: Not assessed Transfers Transfers: Yes Sit to Stand: 6: Modified independent (Device/Increase time) Stand to Sit: 6: Modified independent (Device/Increase time) Locomotion  Ambulation Ambulation: Yes Ambulation/Gait Assistance: 6: Modified independent (Device/Increase time) Ambulation Distance (Feet): 300 Feet Assistive device: None Gait Gait: Yes Gait Pattern: Decreased stride length;Poor foot clearance - right;Step-through pattern;Decreased weight shift to right;Right flexed knee in stance;Decreased step length - left Stairs / Additional Locomotion Stairs: Yes Stairs Assistance: 6: Modified independent (Device/Increase time) Stair Management Technique: One rail Left;Two rails Number of Stairs: 16 Ramp: 6: Modified independent (Device) Curb: 6: Modified independent (Device/increase time) Product manager Mobility: No  Trunk/Postural Assessment  Cervical Assessment Cervical Assessment: Within Functional Limits Thoracic Assessment Thoracic Assessment: Within Functional Limits Lumbar Assessment Lumbar Assessment: Within Functional Limits Postural Control Postural Control: Deficits on evaluation Protective Responses: mildly delayed  Balance Static Standing Balance Static Standing - Level of Assistance: 6: Modified  independent (Device/Increase time) Dynamic Standing Balance Dynamic Standing - Level of Assistance: 6: Modified independent (Device/Increase time) Extremity Assessment      RLE Assessment RLE Assessment: Within Functional Limits (5/5 throughout) LLE Assessment LLE Assessment: Within Functional Limits (5/5 throughout)   See Function Navigator for Current Functional Status.  Korryn Pancoast E Penven-Crew 03/02/2017, 10:04 AM

## 2017-03-02 NOTE — Discharge Instructions (Signed)
Inpatient Rehab Discharge Instructions  Erik [name] Discharge date and time: 03/03/17  Activities/Precautions/ Functional Status: Activity: no lifting, driving, or strenuous exercise till cleared by MD Diet: cardiac diet and diabetic diet Wound Care: none needed   Functional status:  ___ No restrictions     ___ Walk up steps independently ___ 24/7 supervision/assistance   ___ Walk up steps with assistance _X__ Intermittent supervision/assistance  ___ Bathe/dress independently _X__ Walk with cane out of home   ___ Bathe/dress with assistance ___ Walk Independently    ___ Shower independently ___ Walk with assistance    _X__ Shower transfer with assistance _X__ No alcohol     ___ Return to work/school ________   Special Instructions: 1. Follow instructions for procedure as per Dr. Fatima Sanger office.    COMMUNITY REFERRALS UPON DISCHARGE:    Outpatient: PT, OT, SP  Agency:[facility] Phone:[phone]   Date of Last Service:03/02/2017  Appointment Date/Time:MAY 3 8:40-11:45 AM TRYING TO CHANGE TO Friday DUE TO PROCEDURE Wednesday  Medical Equipment/Items Ordered:CANE & SHOWER SEAT  Agency/Supplier:[facility]   [phone]   GENERAL COMMUNITY RESOURCES FOR PATIENT/FAMILY: Support Groups:[facility] SUPPORT GROUP [facility] PRIVATE DINING ROOM ON THE FOURTH Tuesday OF EACH MONTH FROM 12:00-1:30 PM. QUESTIONS CONTACT [facility] [phone]   STROKE/TIA DISCHARGE INSTRUCTIONS SMOKING Cigarette smoking nearly doubles your risk of having a stroke & is the single most alterable risk factor  If you smoke or have smoked in the last 12 months, you are advised to quit smoking for your health.  Most of the excess cardiovascular risk related to smoking disappears within a year of stopping.  Ask you doctor about anti-smoking medications  [place] Quit Line: 1-800-QUIT NOW  Free Smoking Cessation Classes [phone]  CHOLESTEROL Know your levels; limit fat & cholesterol in your diet    Lipid Panel     Component Value Date/Time   CHOL 135 02/16/2017 0612   TRIG 144 02/16/2017 0612   HDL 28 (L) 02/16/2017 0612   CHOLHDL 4.8 02/16/2017 0612   VLDL 29 02/16/2017 0612   LDLCALC 78 02/16/2017 0612      Many patients benefit from treatment even if their cholesterol is at goal.  Goal: Total Cholesterol (CHOL) less than 160  Goal:  Triglycerides (TRIG) less than 150  Goal:  HDL greater than 40  Goal:  LDL (LDLCALC) less than 100   BLOOD PRESSURE American Stroke Association blood pressure target is less that 120/80 mm/Hg  Your discharge blood pressure is:  BP: (!) 130/58  Monitor your blood pressure  Limit your salt and alcohol intake  Many individuals will require more than one medication for high blood pressure  DIABETES (A1c is a blood sugar average for last 3 months) Goal HGBA1c is under 7% (HBGA1c is blood sugar average for last 3 months)  Diabetes:     Lab Results  Component Value Date   HGBA1C 7.0 (H) 02/16/2017     Your HGBA1c can be lowered with medications, healthy diet, and exercise.  Check your blood sugar as directed by your physician  Call your physician if you experience unexplained or low blood sugars.  PHYSICAL ACTIVITY/REHABILITATION Goal is 30 minutes at least 4 days per week  Activity: No driving, Therapies: See above Return to work: to be decided on follow up  Activity decreases your risk of heart attack and stroke and makes your heart stronger.  It helps control your weight and blood pressure; helps you relax and can improve your mood.  Participate  in a regular exercise program.  Talk with your doctor about the best form of exercise for you (dancing, walking, swimming, cycling).  DIET/WEIGHT Goal is to maintain a healthy weight  Your discharge diet is: Diet Carb Modified/Heart healthy. Fluid consistency: Thin; Room service appropriate? Yes liquids Your height is:  Height:  (180.3 cm) Your current weight is: Weight: 96.3 kg  (212 lb 4.9 oz) Your Body Mass Index (BMI) is:  29.7  Following the type of diet specifically designed for you will help prevent another stroke.  Your goal weight is:  179 lbs  Your goal Body Mass Index (BMI) is 19-24.  Healthy food habits can help reduce 3 risk factors for stroke:  High cholesterol, hypertension, and excess weight.  RESOURCES Stroke/Support Group:  Call [phone]   STROKE EDUCATION PROVIDED/REVIEWED AND GIVEN TO PATIENT Stroke warning signs and symptoms How to activate emergency medical system (call 911). Medications prescribed at discharge. Need for follow-up after discharge. Personal risk factors for stroke. Pneumonia vaccine given:  Flu vaccine given:  My questions have been answered, the writing is legible, and I understand these instructions.  I will adhere to these goals & educational materials that have been provided to me after my discharge from the hospital.     My questions have been answered and I understand these instructions. I will adhere to these goals and the provided educational materials after my discharge from the hospital.  Patient/Caregiver Signature _______________________________ Date __________  Clinician Signature _______________________________________ Date __________  Please bring this form and your medication list with you to all your follow-up doctor's appointments.

## 2017-03-02 NOTE — Progress Notes (Signed)
Subjective/Complaints:  Patient would like to go home sooner than tomorrow  ROS-  Denies CP,SOB, N/V/D  Objective: Vital Signs: Blood pressure (!) 132/57, pulse (!) 58, temperature 98.4 F (36.9 C), temperature source Oral, resp. rate 18, height '5\' 11"'  (1.803 m), weight 96.3 kg (212 lb 4.9 oz), SpO2 98 %. No results found. Results for orders placed or performed during the hospital encounter of 02/22/17 (from the past 72 hour(s))  Glucose, capillary     Status: Abnormal   Collection Time: 02/27/17 12:01 PM  Result Value Ref Range   Glucose-Capillary 112 (H) 65 - 99 mg/dL  Glucose, capillary     Status: Abnormal   Collection Time: 02/27/17  4:25 PM  Result Value Ref Range   Glucose-Capillary 145 (H) 65 - 99 mg/dL  Glucose, capillary     Status: Abnormal   Collection Time: 02/27/17  9:49 PM  Result Value Ref Range   Glucose-Capillary 129 (H) 65 - 99 mg/dL  Basic metabolic panel     Status: Abnormal   Collection Time: 02/28/17  5:08 AM  Result Value Ref Range   Sodium 136 135 - 145 mmol/L   Potassium 3.9 3.5 - 5.1 mmol/L   Chloride 100 (L) 101 - 111 mmol/L   CO2 28 22 - 32 mmol/L   Glucose, Bld 126 (H) 65 - 99 mg/dL   BUN 21 (H) 6 - 20 mg/dL   Creatinine, Ser 0.95 0.61 - 1.24 mg/dL   Calcium 9.6 8.9 - 10.3 mg/dL   GFR calc non Af Amer >60 >60 mL/min   GFR calc Af Amer >60 >60 mL/min    Comment: (NOTE) The eGFR has been calculated using the CKD EPI equation. This calculation has not been validated in all clinical situations. eGFR's persistently <60 mL/min signify possible Chronic Kidney Disease.    Anion gap 8 5 - 15  CBC     Status: Abnormal   Collection Time: 02/28/17  5:08 AM  Result Value Ref Range   WBC 5.1 4.0 - 10.5 K/uL   RBC 3.63 (L) 4.22 - 5.81 MIL/uL   Hemoglobin 11.3 (L) 13.0 - 17.0 g/dL   HCT 33.3 (L) 39.0 - 52.0 %   MCV 91.7 78.0 - 100.0 fL   MCH 31.1 26.0 - 34.0 pg   MCHC 33.9 30.0 - 36.0 g/dL   RDW 13.0 11.5 - 15.5 %   Platelets 249 150 - 400 K/uL   Glucose, capillary     Status: Abnormal   Collection Time: 02/28/17  7:05 AM  Result Value Ref Range   Glucose-Capillary 113 (H) 65 - 99 mg/dL  Glucose, capillary     Status: Abnormal   Collection Time: 02/28/17 11:40 AM  Result Value Ref Range   Glucose-Capillary 147 (H) 65 - 99 mg/dL  Glucose, capillary     Status: Abnormal   Collection Time: 02/28/17  4:51 PM  Result Value Ref Range   Glucose-Capillary 110 (H) 65 - 99 mg/dL  Glucose, capillary     Status: Abnormal   Collection Time: 02/28/17  8:33 PM  Result Value Ref Range   Glucose-Capillary 141 (H) 65 - 99 mg/dL   Comment 1 Notify RN    Comment 2 Document in Chart   Glucose, capillary     Status: None   Collection Time: 03/01/17  6:19 AM  Result Value Ref Range   Glucose-Capillary 99 65 - 99 mg/dL   Comment 1 Notify RN    Comment 2 Document in Chart  Glucose, capillary     Status: Abnormal   Collection Time: 03/01/17 12:07 PM  Result Value Ref Range   Glucose-Capillary 118 (H) 65 - 99 mg/dL  Glucose, capillary     Status: Abnormal   Collection Time: 03/01/17  4:51 PM  Result Value Ref Range   Glucose-Capillary 185 (H) 65 - 99 mg/dL  Glucose, capillary     Status: Abnormal   Collection Time: 03/01/17  8:41 PM  Result Value Ref Range   Glucose-Capillary 116 (H) 65 - 99 mg/dL     HEENT: normal Cardio: RRR and no murmur Resp: CTA B/L and unlabored GI: BS positive and NT, ND Extremity:  No Edema Skin:   Intact Neuro: Cranial Nerve Abnormalities R central VII, Normal Sensory, Abnormal Motor 4/5 in RUE adn RLE, 5/5 on left side, Abnormal FMC Ataxic/ dec FMC, Dysarthric and Aphasic Musc/Skel:  Normal Gen NAD   Assessment/Plan: 1. Functional deficits secondary to Left MCA infarct  Stable for D/C today F/u PCP in 3-4 weeks Follow-up interventional radiology, Wednesday, May 2 F/u PM&R 2 weeks See D/C summary See D/C instructions FIM: Function - Bathing Position: Shower Body parts bathed by patient: Right arm,  Left arm, Chest, Abdomen, Front perineal area, Buttocks, Right upper leg, Left upper leg, Right lower leg, Left lower leg Body parts bathed by helper: Back Assist Level: Touching or steadying assistance(Pt > 75%)  Function- Upper Body Dressing/Undressing What is the patient wearing?: Pull over shirt/dress Pull over shirt/dress - Perfomed by patient: Thread/unthread right sleeve, Thread/unthread left sleeve, Put head through opening, Pull shirt over trunk Pull over shirt/dress - Perfomed by helper: Pull shirt over trunk Assist Level: Supervision or verbal cues Function - Lower Body Dressing/Undressing What is the patient wearing?: Pants, Socks, Shoes Position: Sitting EOB Underwear - Performed by patient: Thread/unthread right underwear leg, Thread/unthread left underwear leg Underwear - Performed by helper: Pull underwear up/down Pants- Performed by patient: Thread/unthread right pants leg, Thread/unthread left pants leg, Pull pants up/down Pants- Performed by helper: Pull pants up/down Socks - Performed by patient: Don/doff right sock, Don/doff left sock Shoes - Performed by patient: Don/doff right shoe, Don/doff left shoe Shoes - Performed by helper: Fasten left, Fasten right Assist for footwear: Supervision/touching assist Assist for lower body dressing: Supervision or verbal cues  Function - Toileting Toileting steps completed by patient: Adjust clothing prior to toileting, Performs perineal hygiene, Adjust clothing after toileting Toileting Assistive Devices: Grab bar or rail Assist level: Touching or steadying assistance (Pt.75%)  Function - Toilet Transfers Toilet transfer assistive device: Walker Assist level to toilet: Touching or steadying assistance (Pt > 75%) Assist level from toilet: Touching or steadying assistance (Pt > 75%)  Function - Chair/bed transfer Chair/bed transfer method: Ambulatory Chair/bed transfer assist level: Supervision or verbal cues Chair/bed  transfer assistive device: Armrests, Cane Chair/bed transfer details: Verbal cues for precautions/safety  Function - Locomotion: Wheelchair Will patient use wheelchair at discharge?: No Wheelchair activity did not occur: N/A (pt ambulatory on unit) Function - Locomotion: Ambulation Assistive device: Cane-straight, No device Max distance: 1000 Assist level: Supervision or verbal cues Assist level: Supervision or verbal cues Assist level: Supervision or verbal cues Assist level: Supervision or verbal cues Assist level: Touching or steadying assistance (Pt > 75%)  Function - Comprehension Comprehension: Auditory Comprehension assist level: Understands complex 90% of the time/cues 10% of the time  Function - Expression Expression: Verbal Expression assist level: Expresses complex 90% of the time/cues < 10% of the time  Function -  Social Interaction Social Interaction assist level: Interacts appropriately 90% of the time - Needs monitoring or encouragement for participation or interaction.  Function - Problem Solving Problem solving assist level: Solves complex 90% of the time/cues < 10% of the time  Function - Memory Memory assist level: Recognizes or recalls 90% of the time/requires cueing < 10% of the time Patient normally able to recall (first 3 days only): Current season, Location of own room, Staff names and faces, That he or she is in a hospital  Medical Problem List and Plan: 1.  Right hemiparesis and aphasia  secondary to L MCA infarct Stable for discharge  2.  DVT Prophylaxis/Anticoagulation: Pharmaceutical: Lovenox 3. Pain Management: N/A 4. Mood: On Prozac. LCSW to follow for evaluation and support.  5. Neuropsych: This patient is capable of making decisions on his own behalf. 6. Skin/Wound Care: routine pressure relief measures.  7. Fluids/Electrolytes/Nutrition: Monitor I/O. 8. FMM:CRFVOHK BP bid. Currently well controlled. Will monitor to monitor on Norvasc, HCTZ  and Cozaar. Encourage compliance.controlled 4/27 Vitals:   03/01/17 1446 03/02/17 0510  BP: 120/88 (!) 132/57  Pulse: 65 (!) 58  Resp: 18 18  Temp: 97.8 F (36.6 C) 98.4 F (36.9 C)   9. Dyslipidemia: Continue lipitor.  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  11. T2DM: New diagnosis with Hgb A1C 7.0. Monitor BS ac/hs and use SSI for elevated BS. Will start patient on low dose metformin and titrate upwards as indicated. Well controlled 4/27 CBG (last 3)   Recent Labs  03/01/17 1207 03/01/17 1651 03/01/17 2041  GLUCAP 118* 185* 116*     12. Anemia: Continue to monitor. LOS (Days) 8 A FACE TO FACE EVALUATION WAS PERFORMED  Loisann Roach E 03/02/2017, 6:28 AM

## 2017-03-05 ENCOUNTER — Telehealth: Payer: Self-pay | Admitting: *Deleted

## 2017-03-05 ENCOUNTER — Encounter (HOSPITAL_COMMUNITY): Payer: Self-pay | Admitting: *Deleted

## 2017-03-05 NOTE — Telephone Encounter (Signed)
Transitional Care call completed, Appointment confirmed, Address confirmed, new patient packet mailed  Transitional Care Questions  Questions for our staff to ask patients on Transitional care 48 hour phone call:   1. Are you/is patient experiencing any problems since coming home? Speech therapy is the biggest concern and patients wife is anxious about patient's carotid artery stent procedure.  Are there any questions regarding any aspect of care? Patient's wife concerned about why patient needs to be seen so soon by Dr. Wynn Banker when his therapies do not start until right around appointment time.  2. Are there any questions regarding medications administration/dosing? Some concern about the amount of medications being taken in the AM.  Are meds being taken as prescribed?Yes Patient should review meds with caller to confirm   3. Have there been any falls? One near fall  4. Has Home Health been to the house and/or have they contacted you? No home health, straight to outpatient  If not, have you tried to contact them? Can we help you contact them?   5. Are bowels and bladder emptying properly? Yes Are there any unexpected incontinence issues? No If applicable, is patient following bowel/bladder programs?  6. Any fevers, problems with breathing, unexpected pain? No, no, no   7. Are there any skin problems or new areas of breakdown? no  8. Has the patient/family member arranged specialty MD follow up (ie cardiology/neurology/renal/surgical/etc)?  Yes  Can we help arrange? No   9. Does the patient need any other services or support that we can help arrange? No  10. Are caregivers following through as expected in assisting the patient? Yes, patient reasonably self sufficient   11. Has the patient quit smoking, drinking alcohol, or using drugs as recommended? No smoke, no drink, no illicit drug use

## 2017-03-05 NOTE — Progress Notes (Signed)
I spoke with Erik [name], patient's wife as instructed.  Erik [name] was recently diagnosed with Type II Diabetes, patient does not have a glucose monitor.

## 2017-03-06 ENCOUNTER — Other Ambulatory Visit: Payer: Self-pay | Admitting: General Surgery

## 2017-03-06 ENCOUNTER — Telehealth: Payer: Self-pay | Admitting: Student

## 2017-03-06 ENCOUNTER — Other Ambulatory Visit: Payer: Self-pay | Admitting: Radiology

## 2017-03-06 NOTE — Telephone Encounter (Signed)
Patient's wife called with questions related to procedure tomorrow.  PA briefly discussed plans for case and answered questions as able.  Wife will be present for appointment tomorrow and would like to be sure she has a chance to discuss procedure with Dr. Corliss Skains prior to proceeding.  Wife has no further questions at this time.    Loyce Dys, MS RD PA-C

## 2017-03-07 ENCOUNTER — Encounter (HOSPITAL_COMMUNITY)
Admission: AD | Disposition: A | Discharge status: Home / Self Care | Payer: Self-pay | Source: Ambulatory Visit | Attending: Interventional Radiology

## 2017-03-07 ENCOUNTER — Encounter (HOSPITAL_COMMUNITY): Payer: Self-pay | Admitting: Critical Care Medicine

## 2017-03-07 ENCOUNTER — Inpatient Hospital Stay (HOSPITAL_COMMUNITY)
Admission: AD | Admit: 2017-03-07 | Discharge: 2017-03-08 | DRG: 035 | Disposition: A | Discharge status: Home / Self Care | Payer: Managed Care, Other (non HMO) | Source: Ambulatory Visit | Attending: Interventional Radiology | Admitting: Interventional Radiology

## 2017-03-07 ENCOUNTER — Encounter (HOSPITAL_COMMUNITY): Payer: Self-pay

## 2017-03-07 ENCOUNTER — Other Ambulatory Visit: Payer: Self-pay | Admitting: Radiology

## 2017-03-07 ENCOUNTER — Ambulatory Visit (HOSPITAL_COMMUNITY)
Admission: RE | Admit: 2017-03-07 | Discharge: 2017-03-07 | Disposition: A | Discharge status: Home / Self Care | Payer: Managed Care, Other (non HMO) | Source: Ambulatory Visit | Attending: Interventional Radiology | Admitting: Interventional Radiology

## 2017-03-07 ENCOUNTER — Ambulatory Visit (HOSPITAL_COMMUNITY): Admit: 2017-03-07 | Payer: Managed Care, Other (non HMO)

## 2017-03-07 ENCOUNTER — Ambulatory Visit (HOSPITAL_COMMUNITY): Payer: Managed Care, Other (non HMO) | Admitting: Critical Care Medicine

## 2017-03-07 DIAGNOSIS — Z7984 Long term (current) use of oral hypoglycemic drugs: Secondary | ICD-10-CM

## 2017-03-07 DIAGNOSIS — I10 Essential (primary) hypertension: Secondary | ICD-10-CM | POA: Diagnosis present

## 2017-03-07 DIAGNOSIS — I6503 Occlusion and stenosis of bilateral vertebral arteries: Secondary | ICD-10-CM | POA: Diagnosis present

## 2017-03-07 DIAGNOSIS — Z91013 Allergy to seafood: Secondary | ICD-10-CM

## 2017-03-07 DIAGNOSIS — D759 Disease of blood and blood-forming organs, unspecified: Secondary | ICD-10-CM | POA: Diagnosis present

## 2017-03-07 DIAGNOSIS — Z888 Allergy status to other drugs, medicaments and biological substances status: Secondary | ICD-10-CM | POA: Diagnosis not present

## 2017-03-07 DIAGNOSIS — Z8249 Family history of ischemic heart disease and other diseases of the circulatory system: Secondary | ICD-10-CM | POA: Diagnosis not present

## 2017-03-07 DIAGNOSIS — Z7982 Long term (current) use of aspirin: Secondary | ICD-10-CM

## 2017-03-07 DIAGNOSIS — I69351 Hemiplegia and hemiparesis following cerebral infarction affecting right dominant side: Secondary | ICD-10-CM | POA: Diagnosis not present

## 2017-03-07 DIAGNOSIS — I6521 Occlusion and stenosis of right carotid artery: Principal | ICD-10-CM | POA: Diagnosis present

## 2017-03-07 DIAGNOSIS — I69322 Dysarthria following cerebral infarction: Secondary | ICD-10-CM

## 2017-03-07 DIAGNOSIS — T82868A Thrombosis of vascular prosthetic devices, implants and grafts, initial encounter: Secondary | ICD-10-CM | POA: Diagnosis not present

## 2017-03-07 DIAGNOSIS — E119 Type 2 diabetes mellitus without complications: Secondary | ICD-10-CM | POA: Diagnosis present

## 2017-03-07 DIAGNOSIS — Y92238 Other place in hospital as the place of occurrence of the external cause: Secondary | ICD-10-CM | POA: Diagnosis not present

## 2017-03-07 DIAGNOSIS — I771 Stricture of artery: Secondary | ICD-10-CM

## 2017-03-07 DIAGNOSIS — Y838 Other surgical procedures as the cause of abnormal reaction of the patient, or of later complication, without mention of misadventure at the time of the procedure: Secondary | ICD-10-CM | POA: Diagnosis not present

## 2017-03-07 DIAGNOSIS — I63239 Cerebral infarction due to unspecified occlusion or stenosis of unspecified carotid arteries: Secondary | ICD-10-CM | POA: Diagnosis present

## 2017-03-07 DIAGNOSIS — I69392 Facial weakness following cerebral infarction: Secondary | ICD-10-CM | POA: Diagnosis not present

## 2017-03-07 DIAGNOSIS — K219 Gastro-esophageal reflux disease without esophagitis: Secondary | ICD-10-CM | POA: Diagnosis present

## 2017-03-07 DIAGNOSIS — I639 Cerebral infarction, unspecified: Secondary | ICD-10-CM | POA: Diagnosis not present

## 2017-03-07 HISTORY — DX: Gastro-esophageal reflux disease without esophagitis: K21.9

## 2017-03-07 HISTORY — PX: IR ANGIO VERTEBRAL SEL VERTEBRAL UNI R MOD SED: IMG5368

## 2017-03-07 HISTORY — PX: IR ANGIO VERTEBRAL SEL SUBCLAVIAN INNOMINATE UNI L MOD SED: IMG5364

## 2017-03-07 HISTORY — DX: Cerebral infarction, unspecified: I63.9

## 2017-03-07 HISTORY — DX: Type 2 diabetes mellitus without complications: E11.9

## 2017-03-07 HISTORY — PX: IR ANGIO INTRA EXTRACRAN SEL COM CAROTID INNOMINATE UNI L MOD SED: IMG5358

## 2017-03-07 HISTORY — PX: RADIOLOGY WITH ANESTHESIA: SHX6223

## 2017-03-07 HISTORY — PX: IR INFUSION THROMBOL ARTERIAL INITIAL (MS): IMG5376

## 2017-03-07 HISTORY — DX: Essential (primary) hypertension: I10

## 2017-03-07 HISTORY — PX: IR INTRAVSC STENT CERV CAROTID W/EMB-PROT MOD SED INCL ANGIO: IMG2303

## 2017-03-07 LAB — COMPREHENSIVE METABOLIC PANEL
ALK PHOS: 36 U/L — AB (ref 38–126)
ALT: 41 U/L (ref 17–63)
ANION GAP: 11 (ref 5–15)
AST: 28 U/L (ref 15–41)
Albumin: 4.3 g/dL (ref 3.5–5.0)
BILIRUBIN TOTAL: 0.8 mg/dL (ref 0.3–1.2)
BUN: 20 mg/dL (ref 6–20)
CO2: 27 mmol/L (ref 22–32)
Calcium: 9.9 mg/dL (ref 8.9–10.3)
Chloride: 99 mmol/L — ABNORMAL LOW (ref 101–111)
Creatinine, Ser: 0.97 mg/dL (ref 0.61–1.24)
Glucose, Bld: 128 mg/dL — ABNORMAL HIGH (ref 65–99)
Potassium: 3.3 mmol/L — ABNORMAL LOW (ref 3.5–5.1)
SODIUM: 137 mmol/L (ref 135–145)
TOTAL PROTEIN: 8 g/dL (ref 6.5–8.1)

## 2017-03-07 LAB — HEPARIN LEVEL (UNFRACTIONATED): Heparin Unfractionated: 0.45 IU/mL (ref 0.30–0.70)

## 2017-03-07 LAB — POCT ACTIVATED CLOTTING TIME
ACTIVATED CLOTTING TIME: 136 s
ACTIVATED CLOTTING TIME: 175 s
ACTIVATED CLOTTING TIME: 158 s
Activated Clotting Time: 153 seconds
Activated Clotting Time: 164 seconds

## 2017-03-07 LAB — CBC WITH DIFFERENTIAL/PLATELET
Basophils Absolute: 0 10*3/uL (ref 0.0–0.1)
Basophils Relative: 0 %
Eosinophils Absolute: 0.1 10*3/uL (ref 0.0–0.7)
Eosinophils Relative: 2 %
HCT: 40.3 % (ref 39.0–52.0)
HEMOGLOBIN: 13.7 g/dL (ref 13.0–17.0)
LYMPHS ABS: 1.1 10*3/uL (ref 0.7–4.0)
LYMPHS PCT: 21 %
MCH: 30.7 pg (ref 26.0–34.0)
MCHC: 34 g/dL (ref 30.0–36.0)
MCV: 90.4 fL (ref 78.0–100.0)
MONOS PCT: 8 %
Monocytes Absolute: 0.4 10*3/uL (ref 0.1–1.0)
NEUTROS PCT: 69 %
Neutro Abs: 3.6 10*3/uL (ref 1.7–7.7)
Platelets: 301 10*3/uL (ref 150–400)
RBC: 4.46 MIL/uL (ref 4.22–5.81)
RDW: 13 % (ref 11.5–15.5)
WBC: 5.2 10*3/uL (ref 4.0–10.5)

## 2017-03-07 LAB — GLUCOSE, CAPILLARY
GLUCOSE-CAPILLARY: 167 mg/dL — AB (ref 65–99)
Glucose-Capillary: 127 mg/dL — ABNORMAL HIGH (ref 65–99)
Glucose-Capillary: 155 mg/dL — ABNORMAL HIGH (ref 65–99)

## 2017-03-07 LAB — PROTIME-INR
INR: 0.99
Prothrombin Time: 13.1 seconds (ref 11.4–15.2)

## 2017-03-07 LAB — PLATELET INHIBITION P2Y12: PLATELET FUNCTION P2Y12: 225 [PRU] (ref 194–418)

## 2017-03-07 LAB — APTT: aPTT: 25 seconds (ref 24–36)

## 2017-03-07 SURGERY — RADIOLOGY WITH ANESTHESIA
Anesthesia: Monitor Anesthesia Care

## 2017-03-07 MED ORDER — EPTIFIBATIDE 20 MG/10ML IV SOLN
INTRAVENOUS | Status: AC | PRN
  Administered 2017-03-07: 1.8 mg

## 2017-03-07 MED ORDER — PROMETHAZINE HCL 25 MG/ML IJ SOLN: 6.2500 mg | INTRAMUSCULAR | Status: DC | PRN

## 2017-03-07 MED ORDER — DIPHENHYDRAMINE HCL 50 MG/ML IJ SOLN
INTRAMUSCULAR | Status: AC
  Filled 2017-03-07: qty 1

## 2017-03-07 MED ORDER — HEPARIN (PORCINE) IN NACL 100-0.45 UNIT/ML-% IJ SOLN
INTRAMUSCULAR | Status: AC
  Administered 2017-03-07: 500 [IU]/h via INTRAVENOUS
  Filled 2017-03-07: qty 250

## 2017-03-07 MED ORDER — FLUOXETINE HCL 20 MG PO CAPS
20.0000 mg | ORAL_CAPSULE | Freq: Every day | ORAL | Status: DC
  Administered 2017-03-08: 20 mg via ORAL
  Filled 2017-03-07: qty 1

## 2017-03-07 MED ORDER — CLOPIDOGREL BISULFATE 75 MG PO TABS: 75.0000 mg | ORAL_TABLET | ORAL | Status: DC

## 2017-03-07 MED ORDER — MIDAZOLAM HCL 5 MG/5ML IJ SOLN
INTRAMUSCULAR | Status: DC | PRN
  Administered 2017-03-07 (×4): 0.5 mg via INTRAVENOUS

## 2017-03-07 MED ORDER — ACETAMINOPHEN 325 MG PO TABS: 650.0000 mg | ORAL_TABLET | ORAL | Status: DC | PRN

## 2017-03-07 MED ORDER — EPTIFIBATIDE 20 MG/10ML IV SOLN
INTRAVENOUS | Status: AC | PRN
  Administered 2017-03-07: 1.8 mg via INTRAVENOUS

## 2017-03-07 MED ORDER — ABCIXIMAB 2 MG/ML IV SOLN
10.0000 mg | INTRAVENOUS | Status: AC
  Administered 2017-03-07 (×5): 1.8 mg via INTRAVENOUS
  Filled 2017-03-07: qty 5

## 2017-03-07 MED ORDER — DIAZEPAM 5 MG PO TABS
5.0000 mg | ORAL_TABLET | Freq: Once | ORAL | Status: AC
  Administered 2017-03-07: 5 mg via ORAL
  Filled 2017-03-07: qty 1

## 2017-03-07 MED ORDER — AMLODIPINE BESYLATE 10 MG PO TABS
10.0000 mg | ORAL_TABLET | Freq: Every day | ORAL | Status: DC
  Administered 2017-03-08: 10 mg via ORAL
  Filled 2017-03-07: qty 1

## 2017-03-07 MED ORDER — SODIUM CHLORIDE 0.9 % IV SOLN
INTRAVENOUS | Status: DC
  Administered 2017-03-07: 21:00:00 via INTRAVENOUS

## 2017-03-07 MED ORDER — HYDROCHLOROTHIAZIDE 25 MG PO TABS
25.0000 mg | ORAL_TABLET | Freq: Every day | ORAL | Status: DC
  Administered 2017-03-08: 25 mg via ORAL
  Filled 2017-03-07: qty 1

## 2017-03-07 MED ORDER — LABETALOL HCL 5 MG/ML IV SOLN
INTRAVENOUS | Status: DC | PRN
  Administered 2017-03-07 (×3): 5 mg via INTRAVENOUS

## 2017-03-07 MED ORDER — HEPARIN (PORCINE) IN NACL 100-0.45 UNIT/ML-% IJ SOLN
850.0000 [IU]/h | INTRAMUSCULAR | Status: DC
  Administered 2017-03-07: 850 [IU]/h via INTRAVENOUS
  Filled 2017-03-07: qty 250

## 2017-03-07 MED ORDER — ROSUVASTATIN CALCIUM 40 MG PO TABS
40.0000 mg | ORAL_TABLET | Freq: Every day | ORAL | Status: DC
  Administered 2017-03-07: 40 mg via ORAL
  Filled 2017-03-07 (×2): qty 1

## 2017-03-07 MED ORDER — ASPIRIN 325 MG PO TABS
325.0000 mg | ORAL_TABLET | Freq: Every day | ORAL | Status: DC
  Administered 2017-03-08: 325 mg via ORAL
  Filled 2017-03-07: qty 1

## 2017-03-07 MED ORDER — LACTATED RINGERS IV SOLN
INTRAVENOUS | Status: DC | PRN
  Administered 2017-03-07 (×3): via INTRAVENOUS

## 2017-03-07 MED ORDER — INSULIN ASPART 100 UNIT/ML ~~LOC~~ SOLN
0.0000 [IU] | Freq: Three times a day (TID) | SUBCUTANEOUS | Status: DC
  Administered 2017-03-08: 2 [IU] via SUBCUTANEOUS

## 2017-03-07 MED ORDER — CLOPIDOGREL BISULFATE 75 MG PO TABS
75.0000 mg | ORAL_TABLET | Freq: Every day | ORAL | Status: DC
  Administered 2017-03-08: 75 mg via ORAL
  Filled 2017-03-07: qty 1

## 2017-03-07 MED ORDER — NITROGLYCERIN 1 MG/10 ML FOR IR/CATH LAB
INTRA_ARTERIAL | Status: AC
  Filled 2017-03-07: qty 10

## 2017-03-07 MED ORDER — METHYLPREDNISOLONE SODIUM SUCC 125 MG IJ SOLR
INTRAMUSCULAR | Status: DC | PRN
  Administered 2017-03-07: 125 mg via INTRAVENOUS

## 2017-03-07 MED ORDER — NIMODIPINE 30 MG PO CAPS
0.0000 mg | ORAL_CAPSULE | ORAL | Status: DC
  Filled 2017-03-07: qty 2

## 2017-03-07 MED ORDER — MIDAZOLAM HCL 2 MG/2ML IJ SOLN
INTRAMUSCULAR | Status: AC
  Administered 2017-03-07: 0.5 mg via INTRAVENOUS
  Filled 2017-03-07: qty 2

## 2017-03-07 MED ORDER — LIDOCAINE HCL 1 % IJ SOLN
INTRAMUSCULAR | Status: DC | PRN
  Administered 2017-03-07: 15 mL

## 2017-03-07 MED ORDER — CLEVIDIPINE BUTYRATE 0.5 MG/ML IV EMUL
0.0000 mg/h | INTRAVENOUS | Status: AC
  Administered 2017-03-07 – 2017-03-08 (×3): 5 mg/h via INTRAVENOUS
  Filled 2017-03-07 (×3): qty 50

## 2017-03-07 MED ORDER — EPTIFIBATIDE 20 MG/10ML IV SOLN
INTRAVENOUS | Status: AC
  Filled 2017-03-07: qty 10

## 2017-03-07 MED ORDER — MIDAZOLAM HCL 2 MG/2ML IJ SOLN
0.5000 mg | Freq: Once | INTRAMUSCULAR | Status: AC | PRN
  Administered 2017-03-07: 0.5 mg via INTRAVENOUS

## 2017-03-07 MED ORDER — CEFAZOLIN SODIUM-DEXTROSE 2-4 GM/100ML-% IV SOLN
2.0000 g | INTRAVENOUS | Status: AC
  Administered 2017-03-07: 2 g via INTRAVENOUS

## 2017-03-07 MED ORDER — HEPARIN (PORCINE) IN NACL 100-0.45 UNIT/ML-% IJ SOLN
650.0000 [IU]/h | INTRAMUSCULAR | Status: DC
  Administered 2017-03-07: 650 [IU]/h via INTRAVENOUS
  Filled 2017-03-07: qty 250

## 2017-03-07 MED ORDER — IOPAMIDOL (ISOVUE-300) INJECTION 61%
INTRAVENOUS | Status: AC
  Administered 2017-03-07: 50 mL
  Filled 2017-03-07: qty 150

## 2017-03-07 MED ORDER — ACETAMINOPHEN 160 MG/5ML PO SOLN: 650.0000 mg | ORAL | Status: DC | PRN

## 2017-03-07 MED ORDER — METHYLPREDNISOLONE SODIUM SUCC 125 MG IJ SOLR
INTRAMUSCULAR | Status: AC
  Filled 2017-03-07: qty 2

## 2017-03-07 MED ORDER — DIPHENHYDRAMINE HCL 50 MG/ML IJ SOLN
INTRAMUSCULAR | Status: DC | PRN
  Administered 2017-03-07: 25 mg via INTRAVENOUS

## 2017-03-07 MED ORDER — CEFAZOLIN SODIUM-DEXTROSE 2-4 GM/100ML-% IV SOLN
INTRAVENOUS | Status: AC
  Filled 2017-03-07: qty 100

## 2017-03-07 MED ORDER — IOPAMIDOL (ISOVUE-300) INJECTION 61%
INTRAVENOUS | Status: AC
  Administered 2017-03-07: 100 mL
  Filled 2017-03-07: qty 150

## 2017-03-07 MED ORDER — IOPAMIDOL (ISOVUE-300) INJECTION 61%
INTRAVENOUS | Status: AC
  Administered 2017-03-07: 75 mL
  Filled 2017-03-07: qty 150

## 2017-03-07 MED ORDER — MELATONIN 3 MG PO TABS
3.0000 mg | ORAL_TABLET | Freq: Every evening | ORAL | Status: DC | PRN
  Administered 2017-03-07: 3 mg via ORAL
  Filled 2017-03-07 (×2): qty 1

## 2017-03-07 MED ORDER — ONDANSETRON HCL 4 MG/2ML IJ SOLN: 4.0000 mg | Freq: Four times a day (QID) | INTRAMUSCULAR | Status: DC | PRN

## 2017-03-07 MED ORDER — ADULT MULTIVITAMIN W/MINERALS CH
1.0000 | ORAL_TABLET | Freq: Every day | ORAL | Status: DC
  Administered 2017-03-08: 1 via ORAL
  Filled 2017-03-07: qty 1

## 2017-03-07 MED ORDER — LIDOCAINE HCL 1 % IJ SOLN
INTRAMUSCULAR | Status: AC
  Filled 2017-03-07: qty 20

## 2017-03-07 MED ORDER — MEPERIDINE HCL 25 MG/ML IJ SOLN: 6.2500 mg | INTRAMUSCULAR | Status: DC | PRN

## 2017-03-07 MED ORDER — FENTANYL CITRATE (PF) 100 MCG/2ML IJ SOLN
INTRAMUSCULAR | Status: DC | PRN
  Administered 2017-03-07: 50 ug via INTRAVENOUS
  Administered 2017-03-07 (×6): 25 ug via INTRAVENOUS

## 2017-03-07 MED ORDER — GLYCOPYRROLATE 0.2 MG/ML IV SOSY
PREFILLED_SYRINGE | INTRAVENOUS | Status: DC | PRN
  Administered 2017-03-07: .2 mg via INTRAVENOUS
  Administered 2017-03-07 (×2): .1 mg via INTRAVENOUS

## 2017-03-07 MED ORDER — HEPARIN SODIUM (PORCINE) 1000 UNIT/ML IJ SOLN
INTRAMUSCULAR | Status: DC | PRN
  Administered 2017-03-07: 1500 [IU] via INTRAVENOUS
  Administered 2017-03-07: 500 [IU] via INTRAVENOUS
  Administered 2017-03-07 (×2): 1000 [IU] via INTRAVENOUS
  Administered 2017-03-07: 500 [IU] via INTRAVENOUS
  Administered 2017-03-07: 3000 [IU] via INTRAVENOUS
  Administered 2017-03-07: 1000 [IU] via INTRAVENOUS

## 2017-03-07 MED ORDER — CLOPIDOGREL BISULFATE 75 MG PO TABS
150.0000 mg | ORAL_TABLET | Freq: Once | ORAL | Status: DC
  Administered 2017-03-07: 150 mg via ORAL

## 2017-03-07 MED ORDER — ACETAMINOPHEN 650 MG RE SUPP: 650.0000 mg | RECTAL | Status: DC | PRN

## 2017-03-07 MED ORDER — SODIUM CHLORIDE 0.9 % IV SOLN: INTRAVENOUS | Status: DC

## 2017-03-07 MED ORDER — CLOPIDOGREL BISULFATE 75 MG PO TABS
ORAL_TABLET | ORAL | Status: AC
  Filled 2017-03-07: qty 2

## 2017-03-07 MED ORDER — ASPIRIN EC 325 MG PO TBEC: 325.0000 mg | DELAYED_RELEASE_TABLET | ORAL | Status: DC

## 2017-03-07 MED ORDER — CLOPIDOGREL BISULFATE 75 MG PO TABS: 150.0000 mg | ORAL_TABLET | Freq: Every day | ORAL | Status: DC

## 2017-03-07 MED ORDER — HEPARIN (PORCINE) IN NACL 100-0.45 UNIT/ML-% IJ SOLN
500.0000 [IU]/h | INTRAMUSCULAR | Status: DC
  Administered 2017-03-07: 500 [IU]/h via INTRAVENOUS

## 2017-03-07 MED ORDER — LOSARTAN POTASSIUM 25 MG PO TABS
25.0000 mg | ORAL_TABLET | Freq: Every day | ORAL | Status: DC
  Administered 2017-03-08: 25 mg via ORAL
  Filled 2017-03-07: qty 1

## 2017-03-07 MED ORDER — TAMSULOSIN HCL 0.4 MG PO CAPS
0.4000 mg | ORAL_CAPSULE | Freq: Every day | ORAL | Status: DC
  Administered 2017-03-07: 0.4 mg via ORAL
  Filled 2017-03-07: qty 1

## 2017-03-07 MED ORDER — PANTOPRAZOLE SODIUM 40 MG PO TBEC
40.0000 mg | DELAYED_RELEASE_TABLET | Freq: Every day | ORAL | Status: DC
  Administered 2017-03-07: 40 mg via ORAL
  Filled 2017-03-07: qty 1

## 2017-03-07 NOTE — Progress Notes (Signed)
Day of Surgery . INR   Subjective/Chief Complaint: Denies any H/As,N/V,visual  Or motor or sensory symptomas. Denies any chest pain  Or SOB.   Objective: Vital signs in last 24 hours: Temp:  [97.5 F (36.4 C)-98.7 F (37.1 C)] 98.7 F (37.1 C) (05/02 1700) Pulse Rate:  [62-72] 67 (05/02 1630) Resp:  [10-27] 15 (05/02 1615) BP: (111-155)/(51-80) 126/72 (05/02 1630) SpO2:  [97 %-100 %] 99 % (05/02 1630) Arterial Line BP: (120-158)/(56-63) 126/59 (05/02 1600) Weight:  [192 lb 0.3 oz (87.1 kg)-212 lb (96.2 kg)] 192 lb 0.3 oz (87.1 kg) (05/02 1700)    Intake/Output from previous day: No intake/output data recorded. Intake/Output this shift: Total I/O In: 2000 [I.V.:2000] Out: 400 [Urine:300; Blood:100]  On exam.  VS. BP 120 to 130s/60 to 70s . HR 70s SR. PaO2 > 95 % RA.Marland Kitchen  Neuro exam.  Alert, awake oriented to time ,place and space.Marland Kitchen  Speech and comprehension clear..mod dysarthria unchanged.  PEARLA.Marland Kitchen2.5mm R =L A .  No  Nystagmus.  EOMS full.Jill [place].RT =Lt  To  confrontation  RT Facial droop unchanged.  Tongue Midline..  Motor..           NO drift of outstretched arms.. Mild ly increased ton ein the Rt UE and RT LE         Power          RT hand grip 4+/5 . And 5-/5 RT lE         Fine motor and coordination to finger to nose  Decreased  Though unchanged from prior to procedure..  Gait . Not tested  Romberg. Not tested  Heel to toe.. Not tested  RT groin soft.No hematoma. Pulses 2+ both feet.  Lab Results:   Recent Labs  03/07/17 0740  WBC 5.2  HGB 13.7  HCT 40.3  PLT 301   BMET  Recent Labs  03/07/17 0740  NA 137  K 3.3*  CL 99*  CO2 27  GLUCOSE 128*  BUN 20  CREATININE 0.97  CALCIUM 9.9   PT/INR  Recent Labs  03/07/17 0740  LABPROT 13.1  INR 0.99   ABG No results for input(s): PHART, HCO3 in the last 72 hours.  Invalid input(s): PCO2, PO2  Studies/Results: No results  found.  Anti-infectives: Anti-infectives    None      Assessment/Plan: s/p  RT ICA stent assisted angioplasty with distal protection.  Plan .  1.Cont close neuro obs, IV heparin and BP monitoring as per orders. 2.Advance diet to heart healthy carb modified  As tolerated. 3.P2 Y 12 in AM. ? To brilinta.. US carotids in am. D/W patient and spouse.Marland Kitchen   Oneal Grout 03/07/2017

## 2017-03-07 NOTE — Progress Notes (Signed)
Patient seen this afternoon in PACU.  He is alert, awake, and oriented s/p 4 vessel cerebral arteriogram with intervention.   Patient is resting comfortably without nausea, headache, or change in status.  No new findings of neuro exam. BP with good control.  Continue current care including bedrest for 4 hrs and advancing diet as tolerated once able to raise HOB.  Patient does require increased heparin dosage.  He now has some blood-tinged urine.   Discussed case with Dr. Corliss Skains. IR to follow.    Loyce Dys, MS RD PA-C

## 2017-03-07 NOTE — Sedation Documentation (Signed)
Patient arrived to IR for procedure. Patient awake, alert, follows commands. Speech clear.

## 2017-03-07 NOTE — H&P (Signed)
Chief Complaint: Patient was seen in consultation today for cerebral arteriogram with possible right internal carotid artery angioplasty/stent placement at the request of Dr Lina Sayre  Referring Physician(s): Dr Lina Sayre  Supervising Physician: Julieanne Cotton  Patient Status: [facility] - Out-pt  History of Present Illness: Erik [name] is a 61 y.o. 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 Restoration of TICI 3 flow was achieved with 1 pass of solitaire device and local aspiration, and flow through the left ICA occlusion was re-established by balloon angioplasty. Dense calcified plaque and the geometry of the proximal left ICA precluded placement of the selected Exact stent, and the patient will remain on medical therapy.  Angiogram of the right carotid system demonstrates high-grade stenosis of proximal right ICA secondary to advanced atherosclerotic changes. Although the angiographic measurement of the string sign is 73% stenosis by NASCET criteria, the degree of stenosis is favored to be greater, as the distal vessel is compromised in diameter given the low flow.   Now scheduled for R ICA angioplasty/stent placement In IR with Dr Corliss Skains  [id number] 225 today Plavix 150 mg po now per Dr Corliss Skains  Past Medical History:  Diagnosis Date  . Anxiety   . Arthritis    hands  . BPH (benign prostatic hyperplasia)   . Depression   . 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 ED (erectile dysfunction)   . GERD (gastroesophageal reflux disease)   . H/O calcium pyrophosphate deposition disease (CPPD)   . HLD (hyperlipidemia)   . Hypertension   . Hypogonadism in male   . Lower urinary tract symptoms (LUTS)   . Nocturia   .  Stroke [facility])    Language Impairment.  Left Hemiparesis- improved  . Testosterone overdose   . Vitamin D deficiency     Past Surgical History:  Procedure Laterality Date  . BACK SURGERY    . COLON SURGERY     Colon Resection  . COLONOSCOPY    . IR ANGIO INTRA EXTRACRAN SEL COM CAROTID INNOMINATE UNI R MOD SED  02/16/2017  . IR PERCUTANEOUS ART THROMBECTOMY/INFUSION INTRACRANIAL INC DIAG ANGIO  02/16/2017  . IR PTA NON CORO-LOWER EXTREM  02/16/2017  . IR US GUIDE VASC ACCESS LEFT  02/16/2017  . IR US GUIDE VASC ACCESS RIGHT  02/16/2017  . KNEE ARTHROSCOPY Bilateral   . RADIOLOGY WITH ANESTHESIA N/A 02/16/2017   Procedure: RADIOLOGY WITH ANESTHESIA;  Surgeon: Julieanne Cotton, MD;  Location: [facility] OR;  Service: Radiology;  Laterality: N/A;  . ROTATOR CUFF REPAIR Right     Allergies: Shellfish-derived products and Celecoxib  Medications: Prior to Admission medications   Medication Sig Start Date End Date Taking? Authorizing Provider  acetaminophen (TYLENOL) 325 MG tablet Take 2 tablets (650 mg total) by mouth every 4 (four) hours as needed for mild pain. 02/28/17   Evlyn Kanner Love, PA-C  amLODipine (NORVASC) 10 MG tablet Take 1 tablet (10 mg total) by mouth daily. 03/02/17   Jacquelynn Cree, PA-C  aspirin EC 325 MG EC tablet Take 1 tablet (325 mg total) by mouth daily. 02/23/17   Layne Benton, NP  clopidogrel (PLAVIX) 75 MG tablet Take 1 tablet (75 mg total) by mouth daily. 03/02/17   Rinaldo Cloud  S Love, PA-C  CRESTOR 40 MG tablet Take 1 tablet (40 mg total) by mouth daily. 03/02/17   Jacquelynn Cree, PA-C  FLUoxetine (PROZAC) 20 MG capsule Take 1 capsule (20 mg total) by mouth daily. 03/02/17   Jacquelynn Cree, PA-C  hydrochlorothiazide (HYDRODIURIL) 25 MG tablet Take 1 tablet (25 mg total) by mouth daily. 03/02/17   Evlyn Kanner Love, PA-C  losartan (COZAAR) 25 MG tablet Take 1 tablet (25 mg total) by mouth daily. 03/02/17   Jacquelynn Cree, PA-C  Melatonin 3 MG TABS Take 1 tablet (3 mg total) by mouth at bedtime  as needed (sleep). 03/02/17   Jacquelynn Cree, PA-C  metFORMIN (GLUCOPHAGE) 500 MG tablet Take 1 tablet (500 mg total) by mouth daily with breakfast. 03/03/17   Evlyn Kanner Love, PA-C  Multiple Vitamin (MULTIVITAMIN) tablet Take 1 tablet by mouth daily.    Historical Provider, MD  pantoprazole (PROTONIX) 40 MG tablet Take 1 tablet (40 mg total) by mouth at bedtime. 03/02/17   Jacquelynn Cree, PA-C  tamsulosin (FLOMAX) 0.4 MG CAPS capsule Take 1 capsule (0.4 mg total) by mouth daily after supper. 03/02/17   Jacquelynn Cree, PA-C     Family History  Problem Relation Age of Onset  . Diabetes Mother   . Hypertension Mother   . Dementia Mother   . Malig Hypertension Mother   . Hyperlipidemia Mother   . Heart Problems Mother   . Heart Problems Brother     Social History   Social History  . Marital status: Married    Spouse name: N/A  . Number of children: N/A  . Years of education: N/A   Social History Main Topics  . Smoking status: Never Smoker  . Smokeless tobacco: Never Used  . Alcohol use 4.8 oz/week    6 Shots of liquor, 2 Glasses of wine per week     Comment: drinks every day vodka with water  . Drug use: No  . Sexual activity: Yes   Other Topics Concern  . None   Social History Narrative  . None    Review of Systems: A 12 point ROS discussed and pertinent positives are indicated in the HPI above.  All other systems are negative.  Review of Systems  Constitutional: Positive for activity change and fatigue. Negative for fever and unexpected weight change.  HENT: Negative for tinnitus, trouble swallowing and voice change.   Eyes: Negative for visual disturbance.  Respiratory: Negative for shortness of breath.   Cardiovascular: Negative for chest pain.  Gastrointestinal: Negative for abdominal pain.  Musculoskeletal: Positive for gait problem.  Neurological: Positive for facial asymmetry, speech difficulty and weakness. Negative for dizziness, tremors, seizures, syncope,  light-headedness, numbness and headaches.  Psychiatric/Behavioral: Negative for behavioral problems and confusion.    Vital Signs: There were no vitals taken for this visit.  Physical Exam  Constitutional: He is oriented to person, place, and time. He appears well-nourished.  HENT:  Head: Atraumatic.  +Rt facial droop Tongue midline   Eyes: EOM are normal.  Neck: Neck supple.  Cardiovascular: Normal rate, regular rhythm and normal heart sounds.   Pulmonary/Chest: Effort normal and breath sounds normal. He has no wheezes.  Abdominal: Soft. Bowel sounds are normal. There is no tenderness.  Musculoskeletal: Normal range of motion.  Right sided weakness Strength is still good but weaker than left  Neurological: He is alert and oriented to person, place, and time.  Skin: Skin is warm and dry.  Psychiatric: He has a normal mood and affect. His behavior is normal. Thought content normal.  Consented with pt and wife at bedside  Nursing note and vitals reviewed.   Mallampati Score:  MD Evaluation Airway: WNL Heart: WNL Abdomen: WNL Chest/ Lungs: WNL ASA  Classification: 3 Mallampati/Airway Score: One  Imaging: Ct Angio Head W Or Wo Contrast  Result Date: 02/16/2017 CLINICAL DATA:  Right-sided weakness and aphasia EXAM: CT ANGIOGRAPHY HEAD AND NECK TECHNIQUE: Multidetector CT imaging of the head and neck was performed using the standard protocol during bolus administration of intravenous contrast. Multiplanar CT image reconstructions and MIPs were obtained to evaluate the vascular anatomy. Carotid stenosis measurements (when applicable) are obtained utilizing NASCET criteria, using the distal internal carotid diameter as the denominator. CONTRAST:  50 mL Isovue 370 IV COMPARISON:  Head CT 02/16/2017 FINDINGS: CTA NECK FINDINGS Aortic arch: There is no aneurysm or dissection of the visualized ascending aorta or aortic arch. There is a normal 3 vessel branching pattern. The visualized  proximal subclavian arteries are widely patent. There is aortic atherosclerotic calcification. Right carotid system: There is multifocal atherosclerotic calcification within the right common carotid artery. There is mixed calcified and noncalcified plaque within the proximal right internal carotid artery with at least 90% stenosis. The distal right ICA is normal. Left carotid system: There is calcification at the origin of the left common carotid artery. The left internal carotid artery is occluded at its origin and along the remainder of its cervical course to the skullbase. There is return of contrast enhancement at the distal petrous segment. Vertebral arteries: The vertebral system is codominant. Both vertebral artery origins are patent. There is multifocal irregular narrowing of the left vertebral artery without hemodynamically significant stenosis. Both V3 and V4 segments are normal. The vertebrobasilar confluence is normal. Skeleton: There is no bony spinal canal stenosis. No lytic or blastic lesions. Other neck: The nasopharynx is clear. The oropharynx and hypopharynx are normal. The epiglottis is normal. The supraglottic larynx, glottis and subglottic larynx are normal. No retropharyngeal collection. The parapharyngeal spaces are preserved. The parotid and submandibular glands are normal. No sialolithiasis or salivary ductal dilatation. The thyroid gland is normal. There is no cervical lymphadenopathy. Upper chest: No pneumothorax or pleural effusion. No nodules or masses. Review of the MIP images confirms the above findings CTA HEAD FINDINGS Anterior circulation: --Intracranial internal carotid arteries: The left ICA is occluded without opacification of the proximal petrous segment. The distal petrous segment and the remainder of the intracranial left ICA are opacified. There is mild atherosclerotic calcification of the cavernous and clinoid segments. The right side is normal. --Anterior cerebral arteries:  Normal. --Middle cerebral arteries: There is complete occlusion of the left middle cerebral artery at its origin. No enhancement of the M1 or and 2 segments is seen. The distal small vessels in the left MCA distribution are relatively symmetric compared to the right. The right MCA is normal. --Posterior communicating arteries: Present bilaterally. Posterior circulation: --Posterior cerebral arteries: Normal. --Superior cerebellar arteries: Normal. --Basilar artery: Normal. --Anterior inferior cerebellar arteries: Not clearly visualized. --Posterior inferior cerebellar arteries: Normal. Venous sinuses: As permitted by contrast timing, patent. Anatomic variants: None Delayed phase: Not performed. Review of the MIP images confirms the above findings IMPRESSION: 1. 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 Distal collateral flow within the left MCA distribution persists and is relatively symmetric to the right side. 2. Complete occlusion of the left internal carotid artery at its  origin and along its entire cervical course with return of contrast opacification at the distal petrous segment. 3. At least 90% stenosis at the origin of the right internal carotid artery secondary to mixed calcified and noncalcified plaque. Critical Value/emergent results were called by telephone at the time of interpretation on 02/16/2017 at 1:25 am to Dr. Ritta Slot , who verbally acknowledged these results. Electronically Signed   By: Deatra Robinson M.D.   On: 02/16/2017 01:47   Ct Head Wo Contrast  Result Date: 02/16/2017 CLINICAL DATA:  Left MCA stroke, status post intervention. EXAM: CT HEAD WITHOUT CONTRAST TECHNIQUE: Contiguous axial images were obtained from the base of the skull through the vertex without intravenous contrast. COMPARISON:  CTA head neck 02/16/2017 FINDINGS: Brain: Gray-white differentiation in the left MCA territory is maintained. The basal ganglia and insular  ribbons are preserved. There is no evidence of acute hemorrhage. There is residual contrast material opacifying the venous sinuses. Vascular: The previously seen hyperdense thrombus within the left middle cerebral artery is no longer present. Skull: Normal Sinuses/Orbits: Moderate bilateral maxillary mucosal thickening. Normal orbits. Mastoids are clear. Other: None. IMPRESSION: 1. Status post stroke intervention with resolution of previously seen hyperdense thrombus in the left middle cerebral artery. 2. No acute hemorrhage or cytotoxic edema. Electronically Signed   By: Deatra Robinson M.D.   On: 02/16/2017 06:43   Ct Angio Neck W Or Wo Contrast  Result Date: 02/16/2017 CLINICAL DATA:  Right-sided weakness and aphasia EXAM: CT ANGIOGRAPHY HEAD AND NECK TECHNIQUE: Multidetector CT imaging of the head and neck was performed using the standard protocol during bolus administration of intravenous contrast. Multiplanar CT image reconstructions and MIPs were obtained to evaluate the vascular anatomy. Carotid stenosis measurements (when applicable) are obtained utilizing NASCET criteria, using the distal internal carotid diameter as the denominator. CONTRAST:  50 mL Isovue 370 IV COMPARISON:  Head CT 02/16/2017 FINDINGS: CTA NECK FINDINGS Aortic arch: There is no aneurysm or dissection of the visualized ascending aorta or aortic arch. There is a normal 3 vessel branching pattern. The visualized proximal subclavian arteries are widely patent. There is aortic atherosclerotic calcification. Right carotid system: There is multifocal atherosclerotic calcification within the right common carotid artery. There is mixed calcified and noncalcified plaque within the proximal right internal carotid artery with at least 90% stenosis. The distal right ICA is normal. Left carotid system: There is calcification at the origin of the left common carotid artery. The left internal carotid artery is occluded at its origin and along the  remainder of its cervical course to the skullbase. There is return of contrast enhancement at the distal petrous segment. Vertebral arteries: The vertebral system is codominant. Both vertebral artery origins are patent. There is multifocal irregular narrowing of the left vertebral artery without hemodynamically significant stenosis. Both V3 and V4 segments are normal. The vertebrobasilar confluence is normal. Skeleton: There is no bony spinal canal stenosis. No lytic or blastic lesions. Other neck: The nasopharynx is clear. The oropharynx and hypopharynx are normal. The epiglottis is normal. The supraglottic larynx, glottis and subglottic larynx are normal. No retropharyngeal collection. The parapharyngeal spaces are preserved. The parotid and submandibular glands are normal. No sialolithiasis or salivary ductal dilatation. The thyroid gland is normal. There is no cervical lymphadenopathy. Upper chest: No pneumothorax or pleural effusion. No nodules or masses. Review of the MIP images confirms the above findings CTA HEAD FINDINGS Anterior circulation: --Intracranial internal carotid arteries: The left ICA is occluded without opacification of the proximal  petrous segment. The distal petrous segment and the remainder of the intracranial left ICA are opacified. There is mild atherosclerotic calcification of the cavernous and clinoid segments. The right side is normal. --Anterior cerebral arteries: Normal. --Middle cerebral arteries: There is complete occlusion of the left middle cerebral artery at its origin. No enhancement of the M1 or and 2 segments is seen. The distal small vessels in the left MCA distribution are relatively symmetric compared to the right. The right MCA is normal. --Posterior communicating arteries: Present bilaterally. Posterior circulation: --Posterior cerebral arteries: Normal. --Superior cerebellar arteries: Normal. --Basilar artery: Normal. --Anterior inferior cerebellar arteries: Not clearly  visualized. --Posterior inferior cerebellar arteries: Normal. Venous sinuses: As permitted by contrast timing, patent. Anatomic variants: None Delayed phase: Not performed. Review of the MIP images confirms the above findings IMPRESSION: 1. 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 Distal collateral flow within the left MCA distribution persists and is relatively symmetric to the right side. 2. 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 3. At least 90% stenosis at the origin of the right internal carotid artery secondary to mixed calcified and noncalcified plaque. Critical Value/emergent results were called by telephone at the time of interpretation on 02/16/2017 at 1:25 am to Dr. Ritta Slot , who verbally acknowledged these results. Electronically Signed   By: Deatra Robinson M.D.   On: 02/16/2017 01:47   Mr Maxine Glenn Head Wo Contrast  Result Date: 02/17/2017 CLINICAL DATA:  Stroke, status post tPA and percutaneous intervention. EXAM: MRI HEAD WITHOUT CONTRAST MRA HEAD WITHOUT CONTRAST TECHNIQUE: Multiplanar, multiecho pulse sequences of the brain and surrounding structures were obtained without intravenous contrast. Angiographic images of the head were obtained using MRA technique without contrast. COMPARISON:  CTA head and neck 02/16/2017 FINDINGS: MRI HEAD FINDINGS Brain: There is multifocal diffusion restriction. The largest areas are within the left caudate and retained in, the anterior left temporal lobe and the posterior left temporal lobe. Other small areas are seen along multiple gyri in the left parietal lobe. All of the areas of diffusion restriction are supratentorial and within the left hemisphere. There is associated hyperintense T2 weighted signal at these locations. There is mild multifocal hyperintense T2-weighted signal  within the periventricular white matter, most often seen in the setting of chronic microvascular ischemia. Susceptibility weighted imaging shows petechial hemorrhage in the peripheral left basal ganglia and posterior left temporal lobe. No large intraparenchymal hematoma. No mass lesion or midline shift. No hydrocephalus or extra-axial fluid collection. The midline structures are normal. No age advanced or lobar predominant atrophy. No evidence of chronic microhemorrhage or amyloid angiopathy. Skull and upper cervical spine: The visualized skull base, calvarium, upper cervical spine and extracranial soft tissues are normal. Sinuses/Orbits: There is diffuse paranasal sinus mucosal thickening. The mastoids are clear. Normal orbits. MRA HEAD FINDINGS Intracranial internal carotid arteries: There is attenuated flow related enhancement within the right internal carotid artery, likely indicating slow flow. No flow related enhancement is seen in the left internal carotid artery. Anterior cerebral arteries: Normal. Middle cerebral arteries: Normal. Posterior communicating arteries: Present bilaterally. Posterior cerebral arteries: Normal. Basilar artery: Normal. Vertebral arteries: Left dominant. Normal. Superior cerebellar arteries: Normal. Anterior inferior cerebellar arteries: Normal. Posterior inferior cerebellar arteries: Normal. IMPRESSION: 1. Multiple areas of acute or early subacute ischemia within the supratentorial left hemisphere, with the largest areas located in the  left basal ganglia and left temporal lobe. There is associated cytotoxic edema without midline shift herniation or other significant mass effect. 2. Petechial hemorrhage in the posterior left temporal lobe and peripheral left basal ganglia without space-occupying hematoma. Short interval follow-up head CT is recommended. 3. No flow related enhancement within the visualized left internal carotid artery and diminished enhancement in the right internal  carotid artery. This may be due to slow flow, though recurrent occlusion of the treated left internal carotid artery would be difficult to exclude. CTA of the neck may be helpful to assess possible residual or recurrent occlusion/stenosis. 4. Normal flow related enhancement of the left middle cerebral artery. Electronically Signed   By: Deatra Robinson M.D.   On: 02/17/2017 03:35   Mr Brain Wo Contrast  Result Date: 02/17/2017 CLINICAL DATA:  Stroke, status post tPA and percutaneous intervention. EXAM: MRI HEAD WITHOUT CONTRAST MRA HEAD WITHOUT CONTRAST TECHNIQUE: Multiplanar, multiecho pulse sequences of the brain and surrounding structures were obtained without intravenous contrast. Angiographic images of the head were obtained using MRA technique without contrast. COMPARISON:  CTA head and neck 02/16/2017 FINDINGS: MRI HEAD FINDINGS Brain: There is multifocal diffusion restriction. The largest areas are within the left caudate and retained in, the anterior left temporal lobe and the posterior left temporal lobe. Other small areas are seen along multiple gyri in the left parietal lobe. All of the areas of diffusion restriction are supratentorial and within the left hemisphere. There is associated hyperintense T2 weighted signal at these locations. There is mild multifocal hyperintense T2-weighted signal within the periventricular white matter, most often seen in the setting of chronic microvascular ischemia. Susceptibility weighted imaging shows petechial hemorrhage in the peripheral left basal ganglia and posterior left temporal lobe. No large intraparenchymal hematoma. No mass lesion or midline shift. No hydrocephalus or extra-axial fluid collection. The midline structures are normal. No age advanced or lobar predominant atrophy. No evidence of chronic microhemorrhage or amyloid angiopathy. Skull and upper cervical spine: The visualized skull base, calvarium, upper cervical spine and extracranial soft tissues  are normal. Sinuses/Orbits: There is diffuse paranasal sinus mucosal thickening. The mastoids are clear. Normal orbits. MRA HEAD FINDINGS Intracranial internal carotid arteries: There is attenuated flow related enhancement within the right internal carotid artery, likely indicating slow flow. No flow related enhancement is seen in the left internal carotid artery. Anterior cerebral arteries: Normal. Middle cerebral arteries: Normal. Posterior communicating arteries: Present bilaterally. Posterior cerebral arteries: Normal. Basilar artery: Normal. Vertebral arteries: Left dominant. Normal. Superior cerebellar arteries: Normal. Anterior inferior cerebellar arteries: Normal. Posterior inferior cerebellar arteries: Normal. IMPRESSION: 1. Multiple areas of acute or early subacute ischemia within the supratentorial left hemisphere, with the largest areas located in the left basal ganglia and left temporal lobe. There is associated cytotoxic edema without midline shift herniation or other significant mass effect. 2. Petechial hemorrhage in the posterior left temporal lobe and peripheral left basal ganglia without space-occupying hematoma. Short interval follow-up head CT is recommended. 3. No flow related enhancement within the visualized left internal carotid artery and diminished enhancement in the right internal carotid artery. This may be due to slow flow, though recurrent occlusion of the treated left internal carotid artery would be difficult to exclude. CTA of the neck may be helpful to assess possible residual or recurrent occlusion/stenosis. 4. Normal flow related enhancement of the left middle cerebral artery. Electronically Signed   By: Deatra Robinson M.D.   On: 02/17/2017 03:35   Ir US Guide Vasc Access  Left  Result Date: 02/16/2017 INDICATION: 61 year old male with acute right-sided weakness secondary to acute left hemisphere stroke. CT demonstrates left sided emergent large vessel occlusion of the M1  segment. Last known well 11:30 p.m., and within tPA time frame. IV tPA administered, and given his baseline function, angiogram for mechanical thrombectomy was pursued. CTA demonstrates left ICA occlusion. 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 COMPLICATIONS: None TECHNIQUE: Informed written consent was obtained from the patient's family/wife after a thorough discussion of the procedural risks, benefits and alternatives. Specific risks discussed include: Bleeding, infection, contrast reaction, kidney injury/failure, need for further procedure/surgery, arterial injury or dissection, embolization to new territory, intracranial hemorrhage (10-15% risk), neurologic deterioration, cardiopulmonary collapse, death. All questions were addressed. Maximal Sterile Barrier Technique was utilized including during the procedure including caps, mask, sterile gowns, sterile gloves, sterile drape, hand hygiene and skin antiseptic. A timeout was performed prior to the initiation of the procedure. Ultrasound survey of the left inguinal region was performed with images stored and sent to PACs. A micropuncture needle was used access the left common femoral artery under ultrasound. With excellent arterial blood flow returned, an .018 micro wire was passed through the needle, observed to enter the abdominal aorta under fluoroscopy. The needle  was removed, and a micropuncture sheath was placed over the wire. The inner dilator and wire were removed, and an 035 Bentson wire was advanced under fluoroscopy into the abdominal aorta. The sheath was removed and a standard 4 [place] vascular sheath was placed for hemodynamic monitoring. The dilator was removed and the sheath was flushed. Ultrasound survey of the right inguinal region was performed with images stored and sent to PACs. A micropuncture needle was used access the right common femoral artery under ultrasound. With excellent arterial blood flow returned, an .018 micro wire was passed through the needle, observed to enter the abdominal aorta under fluoroscopy. The needle was removed, and a micropuncture sheath was placed over the wire. The inner dilator and wire were removed, and an 035 Bentson wire was advanced under fluoroscopy into the abdominal aorta. The sheath was removed and a standard 5 [place] vascular sheath was placed for hemodynamic monitoring. The dilator was removed and the sheath was flushed. A 64F JB-1 diagnostic catheter was advanced over the wire to the proximal descending thoracic aorta. Wire was then removed. Double flush of the catheter was performed. Catheter was then used to select the right common carotid artery. Angiogram was performed of the right-sided cervical and cerebral vasculature. Catheter was then withdrawn into the aortic arch, and the origin of the left common carotid artery was selected. Road map angiogram was performed. Standard Glidewire was used to advanced the catheter into the left common carotid artery. Formal angiogram was then performed, confirming the left common carotid artery occlusion and left MCA occlusion. The Glidewire was then used to advance the JB 1 catheter into the external carotid artery branches. Glidewire was then exchanged for a Corporate investment banker. Exchange length Rosen wire was then passed through the diagnostic catheter to the distal  common carotid  artery and the diagnostic catheter was removed. The 5 French sheath was removed and exchanged for 8 French 55 centimeter BrightTip sheath. Sheath was flushed and attached to pressurized and heparinized saline bag for constant forward flow. Then an 8 [place], 85 cm Flowgate balloon tip catheter was prepared on the back table with inflation of the balloon with 50/50 concentration of dilute contrast. The balloon catheter was then advanced over the wire, positioned into the distal common carotid artery. Copious back flush was performed and the balloon catheter was attached to heparinized and pressurized saline bag for forward flow. Rapid transit microcatheter and soft tip Transcend wire were then used to probe the origin of the common carotid artery. The micro wiring catheter combination were successful with crossing the acute occlusion, with the wire catheter combination place distally into the left ICA. Wire was removed and back flow blood was confirmed to confirm luminal position. Small contrast injection through the microcatheter was performed. Exchange length Transcend wire was then placed through the microcatheter, it and a rapid transit was removed over the wire. Balloon angioplasty was then performed at the left ICA occlusion with a 4mm x 30mm Via-Trak rapid exchange angioplasty balloon. Given the recalcitrant stenosis, a 5mm x 30mm balloon was selected with balloon angioplasty to 7 atmosphere. The balloon was then removed, and Provue18 catheter was advanced over the Transcend wire. Transcend wire was removed and a synchro soft micro wire was selected. Micro wire was then advanced into the MCA. The micro wire was then carefully advanced through the occluded segment. Microcatheter would not pass beyond the ophthalmic segment, likely secondary to the incomplete support system with the balloon catheter below the ICA origin. The exchange length Transcend wire was then placed through the microcatheter, and the  microcatheter removed. An intermediate CAT 6 was selected, placed over the micro wire into the distal common carotid artery. The Provue 18 catheter was then advanced through the intermediate catheter while observing the distal aspect of the wire. The combination of the microcatheter and the intermediate catheter were successful in navigating to the skullbase through the persisting stenosis at the right ICA origin. Single attempt a passing the balloon catheter into the ICA was unsuccessful. Tip of the balloon catheter remained in the common carotid artery. The extra support from the intermediate catheter was useful in navigating the micro wire and microcatheter through the occlusion of the MCA into parietal branch. Micro wire was then removed. Blood was then aspirated through the hub of the microcatheter, and a gentle contrast injection was performed confirming intraluminal position. A rotating hemostatic valve was then attached to the back end of the microcatheter, and a pressurized and heparinized saline bag was attached to the catheter. 4 x 40 solitaire device was then selected. Back flush was achieved at the rotating hemostatic valve, and then the device was gently advanced through the microcatheter to the distal end. The retriever was then unsheathed by withdrawing the microcatheter under fluoroscopy. Once the retriever was completely unsheathed, control angiogram was performed from the intermediate catheter. 3 minutes of the stent intercolation. Intermediate catheter was then advanced through the ophthalmic segment into the proximal MCA at the proximal aspect of the solitaire. Constant aspiration was then performed at the tip of the intermediate catheter as the retriever was gently and slowly withdrawn with fluoroscopic observation. Once the retriever was entirely removed from the system, free aspiration was confirmed at the hub of the intermediate catheter, with free blood return confirmed. Significant  thrombus was retrieved from the [facility] device. Repeat angiogram was then performed through the intermediate catheter confirming restoration of TICI3 flow. The intermediate catheter was then withdrawn into the cervical ICA, in preparation to address the left ICA stenosis with anticipated stent placement. Exchange length Transcend wire was placed through the intermediate catheter which was then removed. Once the exchange length trans and wire was in place, a control angiogram was performed, and there was attempted placement of our selected 8-6 x 40mm Exact stent. The stent would not pass through the lesion at the proximal common carotid artery. Stent was removed from the wire and repeat balloon angioplasty with 5 mm balloon was performed. Again, the stent would not pass through the lesion. Stent was removed from the wire, and a combination of 018 SV8 wire and the rapid transit were used to place the 018 buddy wire adjacent to the 0.014 Transcend wire. This was successful in straightening the carotid tortuosity, however, when there was attempt made to pass the stent system with 2 wire in place, the lumen on the flow gate catheter would not accept the buddy wire. The stent system was then removed, and a final balloon angioplasty was performed with 5 mm diameter balloon and the 018 SV 8 wire has a modified cutting balloon technique. Eight atmospheric was achieved (nominal diameter). Final attempt at passing the stent across the lesion was unsuccessful, and attempted stenting was abandoned after discussion with Dr. Amada Jupiter of Stroke Neurology. Final control angiogram of the left carotid system was performed. Control angiogram was performed at the right common femoral artery puncture site, after the 55 centimeter 8 French sheath was exchanged for a standard 9 French sheath at the common femoral artery puncture site. Patient tolerated the procedure well and remained hemodynamically stable throughout. No complications  were encountered. Estimated blood loss approximately 150 cc. FINDINGS: Ultrasound survey of the left common femoral artery demonstrates wide patency. Ultrasound survey of the right common femoral artery demonstrates wide patency. Initial images: Right common carotid artery:  Normal course caliber and contour. Right external carotid artery: Patent with antegrade flow. Right internal carotid artery: Significant stenosis at the proximal right internal carotid artery with a string sign. The percent stenosis estimated greater than 73% fine ask at criteria, however, this is favored to be greater given there is decreased caliber of the distal ICA secondary to decreased flow. Right MCA: M1 segment patent. The arterial, capillary/ parenchymal, and venous phase are delayed given the significant stenosis proximally. Contralateral filling of the left anterior cerebral artery with patent anterior communicating artery and large right A1 segment. Right ACA: Large right A1 segment with perfusion of left and right ACA territory. Left common carotid artery: Left common carotid artery origin is a common or drain from the base of the innominate artery. Mild plaque at the origin. Unremarkable course caliber and contour of the common carotid artery. Left external carotid artery: Patent with antegrade flow. Left internal carotid artery: Advanced calcified and soft plaque at the carotid bifurcation and the left internal carotid artery with occlusion at the initiation of the case. Distal segment is patent, which was proven after crossing of the proximal occlusion. Left MCA: M1 occlusion secondary to thrombus was confirmed on the initial angiogram. Left ACA: Occlusion extended into the left A1 segment, compatible with a T occlusion Final images: After successful thrombectomy, there is restoration of TICI3 flow of the left hemisphere. Restoration of flow through the left A1 segment. Residual high-grade stenosis at the left ICA  origin which  was recalcitrant to multiple balloon angioplasty up to 5 mm with a cutting balloon technique. Dense plaque at the left ICA origin as well as the geometry of the ICA origin precluded placement of the selected stent. Final image demonstrates flow through the left ICA and left MCA territory PROCEDURE: Cervical cerebral angiogram with left MCA thrombectomy with a combined aspiration and solitaire technique. 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 Restoration of TICI 3 flow was achieved with 1 pass of solitaire device and local aspiration, and flow through the left ICA occlusion was re-established by balloon angioplasty. Dense calcified plaque and the geometry of the proximal left ICA precluded placement of the selected Exact stent, and the patient will remain on medical therapy. Angiogram of the right carotid system demonstrates high-grade stenosis of proximal right ICA secondary to advanced atherosclerotic changes. Although the angiographic measurement of the string sign is 73% stenosis by NASCET criteria, the degree of stenosis is favored to be greater, as the distal vessel is compromised in diameter given the low flow. Signed, Yvone Neu. Loreta Ave DO [facility] Radiology PLAN: Noncontrast head CT postprocedure. ICU admission. Continue anti-platelet medication. Right common femoral 9 French sheath maintained which will be removed after 12 hours. Left common femoral 4 French sheath maintained for hemodynamic monitoring. Electronically Signed   By: Gilmer Mor D.O.   On: 02/16/2017 11:18   Ir US Guide Vasc Access Right  Result Date: 02/16/2017 INDICATION: 61 year old male with acute right-sided weakness secondary to acute left hemisphere stroke. CT demonstrates left sided emergent large vessel occlusion of the M1 segment.  Last known well 11:30 p.m., and within tPA time frame. IV tPA administered, and given his baseline function, angiogram for mechanical thrombectomy was pursued. CTA demonstrates left ICA occlusion. 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 COMPLICATIONS: None TECHNIQUE: Informed written consent was obtained from the patient's family/wife after a thorough discussion of the procedural risks, benefits and alternatives. Specific risks discussed include: Bleeding, infection, contrast reaction, kidney injury/failure, need for further procedure/surgery, arterial injury or dissection, embolization to new territory, intracranial hemorrhage (10-15% risk), neurologic deterioration, cardiopulmonary collapse, death. All questions were addressed. Maximal Sterile Barrier Technique was utilized including during the procedure including caps, mask, sterile gowns, sterile gloves, sterile drape, hand hygiene and skin antiseptic. A timeout was performed prior to the initiation of the procedure. Ultrasound survey of the left inguinal region was performed with images stored and sent to PACs. A micropuncture needle was used access the left common femoral artery under ultrasound. With excellent arterial blood flow returned, an .018 micro wire was passed through the needle, observed to enter the abdominal aorta under fluoroscopy. The needle was  removed, and a micropuncture sheath was placed over the wire. The inner dilator and wire were removed, and an 035 Bentson wire was advanced under fluoroscopy into the abdominal aorta. The sheath was removed and a standard  4 French vascular sheath was placed for hemodynamic monitoring. The dilator was removed and the sheath was flushed. Ultrasound survey of the right inguinal region was performed with images stored and sent to PACs. A micropuncture needle was used access the right common femoral artery under ultrasound. With excellent arterial blood flow returned, an .018 micro wire was passed through the needle, observed to enter the abdominal aorta under fluoroscopy. The needle was removed, and a micropuncture sheath was placed over the wire. The inner dilator and wire were removed, and an 035 Bentson wire was advanced under fluoroscopy into the abdominal aorta. The sheath was removed and a standard 5 [place] vascular sheath was placed for hemodynamic monitoring. The dilator was removed and the sheath was flushed. A 64F JB-1 diagnostic catheter was advanced over the wire to the proximal descending thoracic aorta. Wire was then removed. Double flush of the catheter was performed. Catheter was then used to select the right common carotid artery. Angiogram was performed of the right-sided cervical and cerebral vasculature. Catheter was then withdrawn into the aortic arch, and the origin of the left common carotid artery was selected. Road map angiogram was performed. Standard Glidewire was used to advanced the catheter into the left common carotid artery. Formal angiogram was then performed, confirming the left common carotid artery occlusion and left MCA occlusion. The Glidewire was then used to advance the JB 1 catheter into the external carotid artery branches. Glidewire was then exchanged for a Corporate investment banker. Exchange length Rosen wire was then passed through the diagnostic catheter to the distal common carotid artery  and the diagnostic catheter was removed. The 5 French sheath was removed and exchanged for 8 French 55 centimeter BrightTip sheath. Sheath was flushed and attached to pressurized and heparinized saline bag for constant forward flow. Then an 8 [place], 85 cm Flowgate balloon tip catheter was prepared on the back table with inflation of the balloon with 50/50 concentration of dilute contrast. The balloon catheter was then advanced over the wire, positioned into the distal common carotid artery. Copious back flush was performed and the balloon catheter was attached to heparinized and pressurized saline bag for forward flow. Rapid transit microcatheter and soft tip Transcend wire were then used to probe the origin of the common carotid artery. The micro wiring catheter combination were successful with crossing the acute occlusion, with the wire catheter combination place distally into the left ICA. Wire was removed and back flow blood was confirmed to confirm luminal position. Small contrast injection through the microcatheter was performed. Exchange length Transcend wire was then placed through the microcatheter, it and a rapid transit was removed over the wire. Balloon angioplasty was then performed at the left ICA occlusion with a 4mm x 30mm Via-Trak rapid exchange angioplasty balloon. Given the recalcitrant stenosis, a 5mm x 30mm balloon was selected with balloon angioplasty to 7 atmosphere. The balloon was then removed, and Provue18 catheter was advanced over the Transcend wire. Transcend wire was removed and a synchro soft micro wire was selected. Micro wire was then advanced into the MCA. The micro wire was then carefully advanced through the occluded segment. Microcatheter would not pass beyond the ophthalmic segment, likely secondary to the incomplete support system with the balloon catheter below the ICA origin. The exchange length Transcend wire was then placed through the microcatheter, and the microcatheter  removed. An intermediate CAT 6 was selected, placed over the micro wire into the distal common carotid artery. The Provue 18 catheter was then  advanced through the intermediate catheter while observing the distal aspect of the wire. The combination of the microcatheter and the intermediate catheter were successful in navigating to the skullbase through the persisting stenosis at the right ICA origin. Single attempt a passing the balloon catheter into the ICA was unsuccessful. Tip of the balloon catheter remained in the common carotid artery. The extra support from the intermediate catheter was useful in navigating the micro wire and microcatheter through the occlusion of the MCA into parietal branch. Micro wire was then removed. Blood was then aspirated through the hub of the microcatheter, and a gentle contrast injection was performed confirming intraluminal position. A rotating hemostatic valve was then attached to the back end of the microcatheter, and a pressurized and heparinized saline bag was attached to the catheter. 4 x 40 solitaire device was then selected. Back flush was achieved at the rotating hemostatic valve, and then the device was gently advanced through the microcatheter to the distal end. The retriever was then unsheathed by withdrawing the microcatheter under fluoroscopy. Once the retriever was completely unsheathed, control angiogram was performed from the intermediate catheter. 3 minutes of the stent intercolation. Intermediate catheter was then advanced through the ophthalmic segment into the proximal MCA at the proximal aspect of the solitaire. Constant aspiration was then performed at the tip of the intermediate catheter as the retriever was gently and slowly withdrawn with fluoroscopic observation. Once the retriever was entirely removed from the system, free aspiration was confirmed at the hub of the intermediate catheter, with free blood return confirmed. Significant thrombus was  retrieved from the solitaire device. Repeat angiogram was then performed through the intermediate catheter confirming restoration of TICI3 flow. The intermediate catheter was then withdrawn into the cervical ICA, in preparation to address the left ICA stenosis with anticipated stent placement. Exchange length Transcend wire was placed through the intermediate catheter which was then removed. Once the exchange length trans and wire was in place, a control angiogram was performed, and there was attempted placement of our selected 8-6 x 40mm Exact stent. The stent would not pass through the lesion at the proximal common carotid artery. Stent was removed from the wire and repeat balloon angioplasty with 5 mm balloon was performed. Again, the stent would not pass through the lesion. Stent was removed from the wire, and a combination of 018 SV8 wire and the rapid transit were used to place the 018 buddy wire adjacent to the 0.014 Transcend wire. This was successful in straightening the carotid tortuosity, however, when there was attempt made to pass the stent system with 2 wire in place, the lumen on the flow gate catheter would not accept the buddy wire. The stent system was then removed, and a final balloon angioplasty was performed with 5 mm diameter balloon and the 018 SV 8 wire has a modified cutting balloon technique. Eight atmospheric was achieved (nominal diameter). Final attempt at passing the stent across the lesion was unsuccessful, and attempted stenting was abandoned after discussion with Dr. Amada Jupiter of Stroke Neurology. Final control angiogram of the left carotid system was performed. Control angiogram was performed at the right common femoral artery puncture site, after the 55 centimeter 8 French sheath was exchanged for a standard 9 French sheath at the common femoral artery puncture site. Patient tolerated the procedure well and remained hemodynamically stable throughout. No complications were  encountered. Estimated blood loss approximately 150 cc. FINDINGS: Ultrasound survey of the left common femoral artery demonstrates wide patency. Ultrasound survey  of the right common femoral artery demonstrates wide patency. Initial images: Right common carotid artery:  Normal course caliber and contour. Right external carotid artery: Patent with antegrade flow. Right internal carotid artery: Significant stenosis at the proximal right internal carotid artery with a string sign. The percent stenosis estimated greater than 73% fine ask at criteria, however, this is favored to be greater given there is decreased caliber of the distal ICA secondary to decreased flow. Right MCA: M1 segment patent. The arterial, capillary/ parenchymal, and venous phase are delayed given the significant stenosis proximally. Contralateral filling of the left anterior cerebral artery with patent anterior communicating artery and large right A1 segment. Right ACA: Large right A1 segment with perfusion of left and right ACA territory. Left common carotid artery: Left common carotid artery origin is a common or drain from the base of the innominate artery. Mild plaque at the origin. Unremarkable course caliber and contour of the common carotid artery. Left external carotid artery: Patent with antegrade flow. Left internal carotid artery: Advanced calcified and soft plaque at the carotid bifurcation and the left internal carotid artery with occlusion at the initiation of the case. Distal segment is patent, which was proven after crossing of the proximal occlusion. Left MCA: M1 occlusion secondary to thrombus was confirmed on the initial angiogram. Left ACA: Occlusion extended into the left A1 segment, compatible with a T occlusion Final images: After successful thrombectomy, there is restoration of TICI3 flow of the left hemisphere. Restoration of flow through the left A1 segment. Residual high-grade stenosis at the left ICA origin which was  recalcitrant to multiple balloon angioplasty up to 5 mm with a cutting balloon technique. Dense plaque at the left ICA origin as well as the geometry of the ICA origin precluded placement of the selected stent. Final image demonstrates flow through the left ICA and left MCA territory PROCEDURE: Cervical cerebral angiogram with left MCA thrombectomy with a combined aspiration and solitaire technique. 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 Restoration of TICI 3 flow was achieved with 1 pass of solitaire device and local aspiration, and flow through the left ICA occlusion was re-established by balloon angioplasty. Dense calcified plaque and the geometry of the proximal left ICA precluded placement of the selected Exact stent, and the patient will remain on medical therapy. Angiogram of the right carotid system demonstrates high-grade stenosis of proximal right ICA secondary to advanced atherosclerotic changes. Although the angiographic measurement of the string sign is 73% stenosis by NASCET criteria, the degree of stenosis is favored to be greater, as the distal vessel is compromised in diameter given the low flow. Signed, Yvone Neu. Loreta Ave DO [facility] Radiology PLAN: Noncontrast head CT postprocedure. ICU admission. Continue anti-platelet medication. Right common femoral 9 French sheath maintained which will be removed after 12 hours. Left common femoral 4 French sheath maintained for hemodynamic monitoring. Electronically Signed   By: Gilmer Mor D.O.   On: 02/16/2017 11:18   Dg Chest Port 1 View  Result Date: 02/17/2017 CLINICAL DATA:  Respiratory failure EXAM: PORTABLE CHEST 1 VIEW COMPARISON:  02/16/2017 FINDINGS: The trachea tube, NG tube unchanged. Stable enlarged cardiac silhouette. There is improvement in  central venous congestion. Mild RIGHT basilar atelectasis remains. IMPRESSION: 1. Stable support apparatus. 2. Improved aeration to the lungs with persistent RIGHT basilar atelectasis. Electronically Signed   By:  Genevive Bi M.D.   On: 02/17/2017 08:19   Dg Chest Port 1 View  Result Date: 02/16/2017 CLINICAL DATA:  Respiratory failure, intubated EXAM: PORTABLE CHEST 1 VIEW COMPARISON:  None available FINDINGS: Endotracheal tube 4.3 cm above the carina. NG tube within the stomach, tip not visualized. Marked cardiomegaly with central vascular congestion and low lung volumes. Mild streaky edema pattern and basilar atelectasis throughout both lungs. No large effusion or pneumothorax. Trachea is midline. No acute osseous finding. Remote postop changes of the right shoulder. IMPRESSION: Cardiomegaly with mild central vascular congestion and low lung volumes Scattered mild bilateral streaky edema pattern and atelectasis. Support apparatus in good position as above. Electronically Signed   By: Judie Petit.  Shick M.D.   On: 02/16/2017 08:54   Dg Abd Portable 1v  Result Date: 02/16/2017 CLINICAL DATA:  Orogastric tube placement EXAM: PORTABLE ABDOMEN - 1 VIEW COMPARISON:  None. FINDINGS: The tip of the OG tube is in the pyloric region of the stomach and may be in the distal stomach or just transpyloric into the proximal duodenum retrocardiac collapse/consolidation noted left lung base. Visualized abdomen shows nonspecific gas pattern. IMPRESSION: OG tube tip in the region of the pylorus and may be in the duodenal bulb or proximal stomach. Electronically Signed   By: Kennith Center M.D.   On: 02/16/2017 12:20   Ir Percutaneous Art Thrombectomy/infusion Intracranial Inc Diag Angio  Result Date: 02/16/2017 INDICATION: 61 year old male with acute right-sided weakness secondary to acute left hemisphere stroke. CT demonstrates left sided emergent large vessel occlusion of the M1 segment. Last known well 11:30 p.m., and  within tPA time frame. IV tPA administered, and given his baseline function, angiogram for mechanical thrombectomy was pursued. CTA demonstrates left ICA occlusion. 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 COMPLICATIONS: None TECHNIQUE: Informed written consent was obtained from the patient's family/wife after a thorough discussion of the procedural risks, benefits and alternatives. Specific risks discussed include: Bleeding, infection, contrast reaction, kidney injury/failure, need for further procedure/surgery, arterial injury or dissection, embolization to new territory, intracranial hemorrhage (10-15% risk), neurologic deterioration, cardiopulmonary collapse, death. All questions were addressed. Maximal Sterile Barrier Technique was utilized including during the procedure including caps, mask, sterile gowns, sterile gloves, sterile drape, hand hygiene and skin antiseptic. A timeout was performed prior to the initiation of the procedure. Ultrasound survey of the left inguinal region was performed with images stored and sent to PACs. A micropuncture needle was used access the left common femoral artery under ultrasound. With excellent arterial blood flow returned, an .018 micro wire was passed through the needle, observed to enter the abdominal aorta under fluoroscopy. The needle was removed, and a micropuncture sheath  was placed over the wire. The inner dilator and wire were removed, and an 035 Bentson wire was advanced under fluoroscopy into the abdominal aorta. The sheath was removed and a standard 4 [place] vascular sheath was placed for hemodynamic monitoring. The dilator was removed and the sheath was flushed. Ultrasound survey of the right inguinal region was performed with images stored  and sent to PACs. A micropuncture needle was used access the right common femoral artery under ultrasound. With excellent arterial blood flow returned, an .018 micro wire was passed through the needle, observed to enter the abdominal aorta under fluoroscopy. The needle was removed, and a micropuncture sheath was placed over the wire. The inner dilator and wire were removed, and an 035 Bentson wire was advanced under fluoroscopy into the abdominal aorta. The sheath was removed and a standard 5 [place] vascular sheath was placed for hemodynamic monitoring. The dilator was removed and the sheath was flushed. A 33F JB-1 diagnostic catheter was advanced over the wire to the proximal descending thoracic aorta. Wire was then removed. Double flush of the catheter was performed. Catheter was then used to select the right common carotid artery. Angiogram was performed of the right-sided cervical and cerebral vasculature. Catheter was then withdrawn into the aortic arch, and the origin of the left common carotid artery was selected. Road map angiogram was performed. Standard Glidewire was used to advanced the catheter into the left common carotid artery. Formal angiogram was then performed, confirming the left common carotid artery occlusion and left MCA occlusion. The Glidewire was then used to advance the JB 1 catheter into the external carotid artery branches. Glidewire was then exchanged for a Corporate investment banker. Exchange length Rosen wire was then passed through the diagnostic catheter to the distal common carotid artery and the diagnostic catheter was  removed. The 5 French sheath was removed and exchanged for 8 French 55 centimeter BrightTip sheath. Sheath was flushed and attached to pressurized and heparinized saline bag for constant forward flow. Then an 8 [place], 85 cm Flowgate balloon tip catheter was prepared on the back table with inflation of the balloon with 50/50 concentration of dilute contrast. The balloon catheter was then advanced over the wire, positioned into the distal common carotid artery. Copious back flush was performed and the balloon catheter was attached to heparinized and pressurized saline bag for forward flow. Rapid transit microcatheter and soft tip Transcend wire were then used to probe the origin of the common carotid artery. The micro wiring catheter combination were successful with crossing the acute occlusion, with the wire catheter combination place distally into the left ICA. Wire was removed and back flow blood was confirmed to confirm luminal position. Small contrast injection through the microcatheter was performed. Exchange length Transcend wire was then placed through the microcatheter, it and a rapid transit was removed over the wire. Balloon angioplasty was then performed at the left ICA occlusion with a 4mm x 30mm Via-Trak rapid exchange angioplasty balloon. Given the recalcitrant stenosis, a 5mm x 30mm balloon was selected with balloon angioplasty to 7 atmosphere. The balloon was then removed, and Provue18 catheter was advanced over the Transcend wire. Transcend wire was removed and a synchro soft micro wire was selected. Micro wire was then advanced into the MCA. The micro wire was then carefully advanced through the occluded segment. Microcatheter would not pass beyond the ophthalmic segment, likely secondary to the incomplete support system with the balloon catheter below the ICA origin. The exchange length Transcend wire was then placed through the microcatheter, and the microcatheter removed. An intermediate CAT 6  was selected, placed over the micro wire into the distal common carotid artery. The Provue 18 catheter was then advanced through the intermediate catheter while observing the distal aspect of the wire. The combination of the microcatheter and the intermediate catheter were successful in navigating to the skullbase through  the persisting stenosis at the right ICA origin. Single attempt a passing the balloon catheter into the ICA was unsuccessful. Tip of the balloon catheter remained in the common carotid artery. The extra support from the intermediate catheter was useful in navigating the micro wire and microcatheter through the occlusion of the MCA into parietal branch. Micro wire was then removed. Blood was then aspirated through the hub of the microcatheter, and a gentle contrast injection was performed confirming intraluminal position. A rotating hemostatic valve was then attached to the back end of the microcatheter, and a pressurized and heparinized saline bag was attached to the catheter. 4 x 40 solitaire device was then selected. Back flush was achieved at the rotating hemostatic valve, and then the device was gently advanced through the microcatheter to the distal end. The retriever was then unsheathed by withdrawing the microcatheter under fluoroscopy. Once the retriever was completely unsheathed, control angiogram was performed from the intermediate catheter. 3 minutes of the stent intercolation. Intermediate catheter was then advanced through the ophthalmic segment into the proximal MCA at the proximal aspect of the solitaire. Constant aspiration was then performed at the tip of the intermediate catheter as the retriever was gently and slowly withdrawn with fluoroscopic observation. Once the retriever was entirely removed from the system, free aspiration was confirmed at the hub of the intermediate catheter, with free blood return confirmed. Significant thrombus was retrieved from the solitaire device.  Repeat angiogram was then performed through the intermediate catheter confirming restoration of TICI3 flow. The intermediate catheter was then withdrawn into the cervical ICA, in preparation to address the left ICA stenosis with anticipated stent placement. Exchange length Transcend wire was placed through the intermediate catheter which was then removed. Once the exchange length trans and wire was in place, a control angiogram was performed, and there was attempted placement of our selected 8-6 x 40mm Exact stent. The stent would not pass through the lesion at the proximal common carotid artery. Stent was removed from the wire and repeat balloon angioplasty with 5 mm balloon was performed. Again, the stent would not pass through the lesion. Stent was removed from the wire, and a combination of 018 SV8 wire and the rapid transit were used to place the 018 buddy wire adjacent to the 0.014 Transcend wire. This was successful in straightening the carotid tortuosity, however, when there was attempt made to pass the stent system with 2 wire in place, the lumen on the flow gate catheter would not accept the buddy wire. The stent system was then removed, and a final balloon angioplasty was performed with 5 mm diameter balloon and the 018 SV 8 wire has a modified cutting balloon technique. Eight atmospheric was achieved (nominal diameter). Final attempt at passing the stent across the lesion was unsuccessful, and attempted stenting was abandoned after discussion with Dr. Amada Jupiter of Stroke Neurology. Final control angiogram of the left carotid system was performed. Control angiogram was performed at the right common femoral artery puncture site, after the 55 centimeter 8 French sheath was exchanged for a standard 9 French sheath at the common femoral artery puncture site. Patient tolerated the procedure well and remained hemodynamically stable throughout. No complications were encountered. Estimated blood loss  approximately 150 cc. FINDINGS: Ultrasound survey of the left common femoral artery demonstrates wide patency. Ultrasound survey of the right common femoral artery demonstrates wide patency. Initial images: Right common carotid artery:  Normal course caliber and contour. Right external carotid artery: Patent with antegrade flow. Right  internal carotid artery: Significant stenosis at the proximal right internal carotid artery with a string sign. The percent stenosis estimated greater than 73% fine ask at criteria, however, this is favored to be greater given there is decreased caliber of the distal ICA secondary to decreased flow. Right MCA: M1 segment patent. The arterial, capillary/ parenchymal, and venous phase are delayed given the significant stenosis proximally. Contralateral filling of the left anterior cerebral artery with patent anterior communicating artery and large right A1 segment. Right ACA: Large right A1 segment with perfusion of left and right ACA territory. Left common carotid artery: Left common carotid artery origin is a common or drain from the base of the innominate artery. Mild plaque at the origin. Unremarkable course caliber and contour of the common carotid artery. Left external carotid artery: Patent with antegrade flow. Left internal carotid artery: Advanced calcified and soft plaque at the carotid bifurcation and the left internal carotid artery with occlusion at the initiation of the case. Distal segment is patent, which was proven after crossing of the proximal occlusion. Left MCA: M1 occlusion secondary to thrombus was confirmed on the initial angiogram. Left ACA: Occlusion extended into the left A1 segment, compatible with a T occlusion Final images: After successful thrombectomy, there is restoration of TICI3 flow of the left hemisphere. Restoration of flow through the left A1 segment. Residual high-grade stenosis at the left ICA origin which was recalcitrant to multiple balloon  angioplasty up to 5 mm with a cutting balloon technique. Dense plaque at the left ICA origin as well as the geometry of the ICA origin precluded placement of the selected stent. Final image demonstrates flow through the left ICA and left MCA territory PROCEDURE: Cervical cerebral angiogram with left MCA thrombectomy with a combined aspiration and solitaire technique. 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 Restoration of TICI 3 flow was achieved with 1 pass of solitaire device and local aspiration, and flow through the left ICA occlusion was re-established by balloon angioplasty. Dense calcified plaque and the geometry of the proximal left ICA precluded placement of the selected Exact stent, and the patient will remain on medical therapy. Angiogram of the right carotid system demonstrates high-grade stenosis of proximal right ICA secondary to advanced atherosclerotic changes. Although the angiographic measurement of the string sign is 73% stenosis by NASCET criteria, the degree of stenosis is favored to be greater, as the distal vessel is compromised in diameter given the low flow. Signed, Yvone Neu. Loreta Ave DO [facility] Radiology PLAN: Noncontrast head CT postprocedure. ICU admission. Continue anti-platelet medication. Right common femoral 9 French sheath maintained which will be removed after 12 hours. Left common femoral 4 French sheath maintained for hemodynamic monitoring. Electronically Signed   By: Gilmer Mor D.O.   On: 02/16/2017 11:18   Ct Head Code Stroke W/o Cm  Result Date: 02/16/2017 CLINICAL DATA:  Code stroke.  Right-sided weakness and aphasia EXAM: CT HEAD WITHOUT CONTRAST TECHNIQUE: Contiguous axial images were obtained from the base of the skull through the vertex without intravenous contrast.  COMPARISON:  None. FINDINGS: Brain: No mass lesion, intraparenchymal hemorrhage or extra-axial collection. No evidence of acute cortical infarct. Brain parenchyma and CSF-containing spaces are normal for age. Vascular: There is a hyperdense left MCA. Skull: Normal visualized skull base, calvarium and extracranial soft tissues. Sinuses/Orbits: No sinus fluid levels or advanced mucosal  thickening. No mastoid effusion. Normal orbits. ASPECTS [facility] Stroke Program Early CT Score) - Ganglionic level infarction (caudate, lentiform nuclei, internal capsule, insula, M1-M3 cortex): 7 - Supraganglionic infarction (M4-M6 cortex): 3 Total score (0-10 with 10 being normal): 10 IMPRESSION: 1. Hyper dense left middle cerebral artery, likely indicating the presence of acute thrombus. 2. ASPECTS is 10. 3. No intracranial hemorrhage. These results were called by telephone at the time of interpretation on 02/16/2017 at 12:53 am to Dr. Ritta Slot, who verbally acknowledged these results. Electronically Signed   By: Deatra Robinson M.D.   On: 02/16/2017 00:55   Ir Pta Non Coro-lower Extrem  Result Date: 02/16/2017 INDICATION: 61 year old male with acute right-sided weakness secondary to acute left hemisphere stroke. CT demonstrates left sided emergent large vessel occlusion of the M1 segment. Last known well 11:30 p.m., and within tPA time frame. IV tPA administered, and given his baseline function, angiogram for mechanical thrombectomy was pursued. CTA demonstrates left ICA occlusion. EXAM: ULTRASOUND-GUIDED LEFT COMMON FEMORAL ARTERY ACCESS FOR HEMODYNAMIC MONITORING ULTRASOUND-GUIDED RIGHT COMMON FEMORAL ARTERY ACCESS FOR ANGIOGRAM CERVICO CEREBRAL ANGIOGRAM, WITH COMPLETION CERVICO CEREBRAL ANGIOGRAM AFTER THROMBECTOMY ANGIOPLASTY OF LEFT INTERNAL CAROTID ARTERY FOR ACUTE ICA OCCLUSION MECHANICAL THROMBECTOMY FOR EMERGENT LARGE VESSEL OCCLUSION LEFT MCA COMPARISON:  CT 02/16/2017, CT angiogram 02/16/2017 MEDICATIONS: 300  mg Plavix, 325 mg aspirin ANESTHESIA/SEDATION: General endotracheal tube anesthesia with the anesthesia team CONTRAST:  180 cc FLUOROSCOPY TIME:  Fluoroscopy Time: 59 minutes 24 seconds (5229 mGy). COMPLICATIONS: None TECHNIQUE: Informed written consent was obtained from the patient's family/wife after a thorough discussion of the procedural risks, benefits and alternatives. Specific risks discussed include: Bleeding, infection, contrast reaction, kidney injury/failure, need for further procedure/surgery, arterial injury or dissection, embolization to new territory, intracranial hemorrhage (10-15% risk), neurologic deterioration, cardiopulmonary collapse, death. All questions were addressed. Maximal Sterile Barrier Technique was utilized including during the procedure including caps, mask, sterile gowns, sterile gloves, sterile drape, hand hygiene and skin antiseptic. A timeout was performed prior to the initiation of the procedure. Ultrasound survey of the left inguinal region was performed with images stored and sent to PACs. A micropuncture needle was used access the left common femoral artery under ultrasound. With excellent arterial blood flow returned, an .018 micro wire was passed through the needle, observed to enter the abdominal aorta under fluoroscopy. The needle was removed, and a micropuncture sheath was placed over the wire. The inner dilator and wire were removed, and an 035 Bentson wire was advanced under fluoroscopy into the abdominal aorta. The sheath was removed and a standard 4 [place] vascular sheath was placed for hemodynamic monitoring. The dilator was removed and the sheath was flushed. Ultrasound survey of the right inguinal region was performed with images stored and sent to PACs. A micropuncture needle was used access the right common femoral artery under ultrasound. With excellent arterial blood flow returned, an .018 micro wire was passed through the needle, observed to enter the  abdominal aorta under fluoroscopy. The needle was removed, and a micropuncture sheath was placed over the wire. The inner dilator and wire were removed, and an 035 Bentson wire was advanced under fluoroscopy into the abdominal aorta. The sheath was removed and a standard 5 [place] vascular sheath was placed for hemodynamic monitoring. The dilator was removed and the sheath was flushed. A 56F JB-1 diagnostic catheter was advanced over the wire to the proximal descending thoracic aorta. Wire was then removed. Double flush of the catheter was performed. Catheter was then used to select  the right common carotid artery. Angiogram was performed of the right-sided cervical and cerebral vasculature. Catheter was then withdrawn into the aortic arch, and the origin of the left common carotid artery was selected. Road map angiogram was performed. Standard Glidewire was used to advanced the catheter into the left common carotid artery. Formal angiogram was then performed, confirming the left common carotid artery occlusion and left MCA occlusion. The Glidewire was then used to advance the JB 1 catheter into the external carotid artery branches. Glidewire was then exchanged for a Corporate investment banker. Exchange length Rosen wire was then passed through the diagnostic catheter to the distal common carotid artery and the diagnostic catheter was removed. The 5 French sheath was removed and exchanged for 8 French 55 centimeter BrightTip sheath. Sheath was flushed and attached to pressurized and heparinized saline bag for constant forward flow. Then an 8 [place], 85 cm Flowgate balloon tip catheter was prepared on the back table with inflation of the balloon with 50/50 concentration of dilute contrast. The balloon catheter was then advanced over the wire, positioned into the distal common carotid artery. Copious back flush was performed and the balloon catheter was attached to heparinized and pressurized saline bag for forward flow. Rapid transit  microcatheter and soft tip Transcend wire were then used to probe the origin of the common carotid artery. The micro wiring catheter combination were successful with crossing the acute occlusion, with the wire catheter combination place distally into the left ICA. Wire was removed and back flow blood was confirmed to confirm luminal position. Small contrast injection through the microcatheter was performed. Exchange length Transcend wire was then placed through the microcatheter, it and a rapid transit was removed over the wire. Balloon angioplasty was then performed at the left ICA occlusion with a 4mm x 30mm Via-Trak rapid exchange angioplasty balloon. Given the recalcitrant stenosis, a 5mm x 30mm balloon was selected with balloon angioplasty to 7 atmosphere. The balloon was then removed, and Provue18 catheter was advanced over the Transcend wire. Transcend wire was removed and a synchro soft micro wire was selected. Micro wire was then advanced into the MCA. The micro wire was then carefully advanced through the occluded segment. Microcatheter would not pass beyond the ophthalmic segment, likely secondary to the incomplete support system with the balloon catheter below the ICA origin. The exchange length Transcend wire was then placed through the microcatheter, and the microcatheter removed. An intermediate CAT 6 was selected, placed over the micro wire into the distal common carotid artery. The Provue 18 catheter was then advanced through the intermediate catheter while observing the distal aspect of the wire. The combination of the microcatheter and the intermediate catheter were successful in navigating to the skullbase through the persisting stenosis at the right ICA origin. Single attempt a passing the balloon catheter into the ICA was unsuccessful. Tip of the balloon catheter remained in the common carotid artery. The extra support from the intermediate catheter was useful in navigating the micro wire and  microcatheter through the occlusion of the MCA into parietal branch. Micro wire was then removed. Blood was then aspirated through the hub of the microcatheter, and a gentle contrast injection was performed confirming intraluminal position. A rotating hemostatic valve was then attached to the back end of the microcatheter, and a pressurized and heparinized saline bag was attached to the catheter. 4 x 40 solitaire device was then selected. Back flush was achieved at the rotating hemostatic valve, and then the device was gently advanced  through the microcatheter to the distal end. The retriever was then unsheathed by withdrawing the microcatheter under fluoroscopy. Once the retriever was completely unsheathed, control angiogram was performed from the intermediate catheter. 3 minutes of the stent intercolation. Intermediate catheter was then advanced through the ophthalmic segment into the proximal MCA at the proximal aspect of the solitaire. Constant aspiration was then performed at the tip of the intermediate catheter as the retriever was gently and slowly withdrawn with fluoroscopic observation. Once the retriever was entirely removed from the system, free aspiration was confirmed at the hub of the intermediate catheter, with free blood return confirmed. Significant thrombus was retrieved from the solitaire device. Repeat angiogram was then performed through the intermediate catheter confirming restoration of TICI3 flow. The intermediate catheter was then withdrawn into the cervical ICA, in preparation to address the left ICA stenosis with anticipated stent placement. Exchange length Transcend wire was placed through the intermediate catheter which was then removed. Once the exchange length trans and wire was in place, a control angiogram was performed, and there was attempted placement of our selected 8-6 x 40mm Exact stent. The stent would not pass through the lesion at the proximal common carotid artery. Stent  was removed from the wire and repeat balloon angioplasty with 5 mm balloon was performed. Again, the stent would not pass through the lesion. Stent was removed from the wire, and a combination of 018 SV8 wire and the rapid transit were used to place the 018 buddy wire adjacent to the 0.014 Transcend wire. This was successful in straightening the carotid tortuosity, however, when there was attempt made to pass the stent system with 2 wire in place, the lumen on the flow gate catheter would not accept the buddy wire. The stent system was then removed, and a final balloon angioplasty was performed with 5 mm diameter balloon and the 018 SV 8 wire has a modified cutting balloon technique. Eight atmospheric was achieved (nominal diameter). Final attempt at passing the stent across the lesion was unsuccessful, and attempted stenting was abandoned after discussion with Dr. Amada Jupiter of Stroke Neurology. Final control angiogram of the left carotid system was performed. Control angiogram was performed at the right common femoral artery puncture site, after the 55 centimeter 8 French sheath was exchanged for a standard 9 French sheath at the common femoral artery puncture site. Patient tolerated the procedure well and remained hemodynamically stable throughout. No complications were encountered. Estimated blood loss approximately 150 cc. FINDINGS: Ultrasound survey of the left common femoral artery demonstrates wide patency. Ultrasound survey of the right common femoral artery demonstrates wide patency. Initial images: Right common carotid artery:  Normal course caliber and contour. Right external carotid artery: Patent with antegrade flow. Right internal carotid artery: Significant stenosis at the proximal right internal carotid artery with a string sign. The percent stenosis estimated greater than 73% fine ask at criteria, however, this is favored to be greater given there is decreased caliber of the distal ICA secondary  to decreased flow. Right MCA: M1 segment patent. The arterial, capillary/ parenchymal, and venous phase are delayed given the significant stenosis proximally. Contralateral filling of the left anterior cerebral artery with patent anterior communicating artery and large right A1 segment. Right ACA: Large right A1 segment with perfusion of left and right ACA territory. Left common carotid artery: Left common carotid artery origin is a common or drain from the base of the innominate artery. Mild plaque at the origin. Unremarkable course caliber and contour of the  common carotid artery. Left external carotid artery: Patent with antegrade flow. Left internal carotid artery: Advanced calcified and soft plaque at the carotid bifurcation and the left internal carotid artery with occlusion at the initiation of the case. Distal segment is patent, which was proven after crossing of the proximal occlusion. Left MCA: M1 occlusion secondary to thrombus was confirmed on the initial angiogram. Left ACA: Occlusion extended into the left A1 segment, compatible with a T occlusion Final images: After successful thrombectomy, there is restoration of TICI3 flow of the left hemisphere. Restoration of flow through the left A1 segment. Residual high-grade stenosis at the left ICA origin which was recalcitrant to multiple balloon angioplasty up to 5 mm with a cutting balloon technique. Dense plaque at the left ICA origin as well as the geometry of the ICA origin precluded placement of the selected stent. Final image demonstrates flow through the left ICA and left MCA territory PROCEDURE: Cervical cerebral angiogram with left MCA thrombectomy with a combined aspiration and solitaire technique. 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 Restoration of TICI 3 flow was achieved with 1 pass of solitaire  device and local aspiration, and flow through the left ICA occlusion was re-established by balloon angioplasty. Dense calcified plaque and the geometry of the proximal left ICA precluded placement of the selected Exact stent, and the patient will remain on medical therapy. Angiogram of the right carotid system demonstrates high-grade stenosis of proximal right ICA secondary to advanced atherosclerotic changes. Although the angiographic measurement of the string sign is 73% stenosis by NASCET criteria, the degree of stenosis is favored to be greater, as the distal vessel is compromised in diameter given the low flow. Signed, Yvone Neu. Loreta Ave DO [facility] Radiology PLAN: Noncontrast head CT postprocedure. ICU admission. Continue anti-platelet medication. Right common femoral 9 French sheath maintained which will be removed after 12 hours. Left common femoral 4 French sheath maintained for hemodynamic monitoring. Electronically Signed   By: Gilmer Mor D.O.   On: 02/16/2017 11:18   Ir Angio Intra Extracran Sel Com Carotid Innominate Uni R Mod Sed  Result Date: 02/16/2017 INDICATION: 61 year old male with acute right-sided weakness secondary to acute left hemisphere stroke. CT demonstrates left sided emergent large vessel occlusion of the M1 segment. Last known well 11:30 p.m., and within tPA time frame. IV tPA administered, and given his baseline function, angiogram for mechanical thrombectomy was pursued. CTA demonstrates left ICA occlusion. EXAM: ULTRASOUND-GUIDED LEFT COMMON FEMORAL ARTERY ACCESS FOR HEMODYNAMIC MONITORING ULTRASOUND-GUIDED RIGHT COMMON FEMORAL ARTERY ACCESS FOR ANGIOGRAM CERVICO CEREBRAL ANGIOGRAM, WITH COMPLETION CERVICO CEREBRAL ANGIOGRAM AFTER THROMBECTOMY ANGIOPLASTY OF LEFT INTERNAL CAROTID ARTERY FOR ACUTE ICA OCCLUSION MECHANICAL THROMBECTOMY FOR EMERGENT LARGE VESSEL OCCLUSION LEFT MCA COMPARISON:  CT 02/16/2017, CT angiogram  02/16/2017 MEDICATIONS: 300 mg Plavix, 325 mg aspirin ANESTHESIA/SEDATION: General endotracheal tube anesthesia with the anesthesia team CONTRAST:  180 cc FLUOROSCOPY TIME:  Fluoroscopy Time: 59 minutes 24 seconds (5229 mGy). COMPLICATIONS: None TECHNIQUE: Informed written consent was obtained from the patient's family/wife after a thorough discussion of the procedural risks, benefits and alternatives. Specific risks discussed include: Bleeding, infection, contrast reaction, kidney injury/failure, need for further procedure/surgery, arterial injury or dissection, embolization to new territory, intracranial hemorrhage (10-15% risk), neurologic deterioration, cardiopulmonary collapse, death. All questions were addressed. Maximal Sterile Barrier Technique was utilized including during the procedure including caps, mask, sterile  gowns, sterile gloves, sterile drape, hand hygiene and skin antiseptic. A timeout was performed prior to the initiation of the procedure. Ultrasound survey of the left inguinal region was performed with images stored and sent to PACs. A micropuncture needle was used access the left common femoral artery under ultrasound. With excellent arterial blood flow returned, an .018 micro wire was passed through the needle, observed to enter the abdominal aorta under fluoroscopy. The needle was removed, and a micropuncture sheath was placed over the wire. The inner dilator and wire were removed, and an 035 Bentson wire was advanced under fluoroscopy into the abdominal aorta. The sheath was removed and a standard 4 [place] vascular sheath was placed for hemodynamic monitoring. The dilator was removed and the sheath was flushed. Ultrasound survey of the right inguinal region was performed with images stored and sent to PACs. A micropuncture needle was used access the right common femoral artery under ultrasound. With excellent arterial blood flow returned, an .018 micro wire was passed through the needle,  observed to enter the abdominal aorta under fluoroscopy. The needle was removed, and a micropuncture sheath was placed over the wire. The inner dilator and wire were removed, and an 035 Bentson wire was advanced under fluoroscopy into the abdominal aorta. The sheath was removed and a standard 5 [place] vascular sheath was placed for hemodynamic monitoring. The dilator was removed and the sheath was flushed. A 43F JB-1 diagnostic catheter was advanced over the wire to the proximal descending thoracic aorta. Wire was then removed. Double flush of the catheter was performed. Catheter was then used to select the right common carotid artery. Angiogram was performed of the right-sided cervical and cerebral vasculature. Catheter was then withdrawn into the aortic arch, and the origin of the left common carotid artery was selected. Road map angiogram was performed. Standard Glidewire was used to advanced the catheter into the left common carotid artery. Formal angiogram was then performed, confirming the left common carotid artery occlusion and left MCA occlusion. The Glidewire was then used to advance the JB 1 catheter into the external carotid artery branches. Glidewire was then exchanged for a Corporate investment banker. Exchange length Rosen wire was then passed through the diagnostic catheter to the distal common carotid artery and the diagnostic catheter was removed. The 5 French sheath was removed and exchanged for 8 French 55 centimeter BrightTip sheath. Sheath was flushed and attached to pressurized and heparinized saline bag for constant forward flow. Then an 8 [place], 85 cm Flowgate balloon tip catheter was prepared on the back table with inflation of the balloon with 50/50 concentration of dilute contrast. The balloon catheter was then advanced over the wire, positioned into the distal common carotid artery. Copious back flush was performed and the balloon catheter was attached to heparinized and pressurized saline bag for  forward flow. Rapid transit microcatheter and soft tip Transcend wire were then used to probe the origin of the common carotid artery. The micro wiring catheter combination were successful with crossing the acute occlusion, with the wire catheter combination place distally into the left ICA. Wire was removed and back flow blood was confirmed to confirm luminal position. Small contrast injection through the microcatheter was performed. Exchange length Transcend wire was then placed through the microcatheter, it and a rapid transit was removed over the wire. Balloon angioplasty was then performed at the left ICA occlusion with a 4mm x 30mm Via-Trak rapid exchange angioplasty balloon. Given the recalcitrant stenosis, a 5mm x 30mm balloon was  selected with balloon angioplasty to 7 atmosphere. The balloon was then removed, and Provue18 catheter was advanced over the Transcend wire. Transcend wire was removed and a synchro soft micro wire was selected. Micro wire was then advanced into the MCA. The micro wire was then carefully advanced through the occluded segment. Microcatheter would not pass beyond the ophthalmic segment, likely secondary to the incomplete support system with the balloon catheter below the ICA origin. The exchange length Transcend wire was then placed through the microcatheter, and the microcatheter removed. An intermediate CAT 6 was selected, placed over the micro wire into the distal common carotid artery. The Provue 18 catheter was then advanced through the intermediate catheter while observing the distal aspect of the wire. The combination of the microcatheter and the intermediate catheter were successful in navigating to the skullbase through the persisting stenosis at the right ICA origin. Single attempt a passing the balloon catheter into the ICA was unsuccessful. Tip of the balloon catheter remained in the common carotid artery. The extra support from the intermediate catheter was useful in  navigating the micro wire and microcatheter through the occlusion of the MCA into parietal branch. Micro wire was then removed. Blood was then aspirated through the hub of the microcatheter, and a gentle contrast injection was performed confirming intraluminal position. A rotating hemostatic valve was then attached to the back end of the microcatheter, and a pressurized and heparinized saline bag was attached to the catheter. 4 x 40 solitaire device was then selected. Back flush was achieved at the rotating hemostatic valve, and then the device was gently advanced through the microcatheter to the distal end. The retriever was then unsheathed by withdrawing the microcatheter under fluoroscopy. Once the retriever was completely unsheathed, control angiogram was performed from the intermediate catheter. 3 minutes of the stent intercolation. Intermediate catheter was then advanced through the ophthalmic segment into the proximal MCA at the proximal aspect of the solitaire. Constant aspiration was then performed at the tip of the intermediate catheter as the retriever was gently and slowly withdrawn with fluoroscopic observation. Once the retriever was entirely removed from the system, free aspiration was confirmed at the hub of the intermediate catheter, with free blood return confirmed. Significant thrombus was retrieved from the solitaire device. Repeat angiogram was then performed through the intermediate catheter confirming restoration of TICI3 flow. The intermediate catheter was then withdrawn into the cervical ICA, in preparation to address the left ICA stenosis with anticipated stent placement. Exchange length Transcend wire was placed through the intermediate catheter which was then removed. Once the exchange length trans and wire was in place, a control angiogram was performed, and there was attempted placement of our selected 8-6 x 40mm Exact stent. The stent would not pass through the lesion at the proximal  common carotid artery. Stent was removed from the wire and repeat balloon angioplasty with 5 mm balloon was performed. Again, the stent would not pass through the lesion. Stent was removed from the wire, and a combination of 018 SV8 wire and the rapid transit were used to place the 018 buddy wire adjacent to the 0.014 Transcend wire. This was successful in straightening the carotid tortuosity, however, when there was attempt made to pass the stent system with 2 wire in place, the lumen on the flow gate catheter would not accept the buddy wire. The stent system was then removed, and a final balloon angioplasty was performed with 5 mm diameter balloon and the 018 SV 8 wire has  a modified cutting balloon technique. Eight atmospheric was achieved (nominal diameter). Final attempt at passing the stent across the lesion was unsuccessful, and attempted stenting was abandoned after discussion with Dr. Amada Jupiter of Stroke Neurology. Final control angiogram of the left carotid system was performed. Control angiogram was performed at the right common femoral artery puncture site, after the 55 centimeter 8 French sheath was exchanged for a standard 9 French sheath at the common femoral artery puncture site. Patient tolerated the procedure well and remained hemodynamically stable throughout. No complications were encountered. Estimated blood loss approximately 150 cc. FINDINGS: Ultrasound survey of the left common femoral artery demonstrates wide patency. Ultrasound survey of the right common femoral artery demonstrates wide patency. Initial images: Right common carotid artery:  Normal course caliber and contour. Right external carotid artery: Patent with antegrade flow. Right internal carotid artery: Significant stenosis at the proximal right internal carotid artery with a string sign. The percent stenosis estimated greater than 73% fine ask at criteria, however, this is favored to be greater given there is decreased caliber  of the distal ICA secondary to decreased flow. Right MCA: M1 segment patent. The arterial, capillary/ parenchymal, and venous phase are delayed given the significant stenosis proximally. Contralateral filling of the left anterior cerebral artery with patent anterior communicating artery and large right A1 segment. Right ACA: Large right A1 segment with perfusion of left and right ACA territory. Left common carotid artery: Left common carotid artery origin is a common or drain from the base of the innominate artery. Mild plaque at the origin. Unremarkable course caliber and contour of the common carotid artery. Left external carotid artery: Patent with antegrade flow. Left internal carotid artery: Advanced calcified and soft plaque at the carotid bifurcation and the left internal carotid artery with occlusion at the initiation of the case. Distal segment is patent, which was proven after crossing of the proximal occlusion. Left MCA: M1 occlusion secondary to thrombus was confirmed on the initial angiogram. Left ACA: Occlusion extended into the left A1 segment, compatible with a T occlusion Final images: After successful thrombectomy, there is restoration of TICI3 flow of the left hemisphere. Restoration of flow through the left A1 segment. Residual high-grade stenosis at the left ICA origin which was recalcitrant to multiple balloon angioplasty up to 5 mm with a cutting balloon technique. Dense plaque at the left ICA origin as well as the geometry of the ICA origin precluded placement of the selected stent. Final image demonstrates flow through the left ICA and left MCA territory PROCEDURE: Cervical cerebral angiogram with left MCA thrombectomy with a combined aspiration and solitaire technique. 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 Restoration of TICI 3 flow was  achieved with 1 pass of solitaire device and local aspiration, and flow through the left ICA occlusion was re-established by balloon angioplasty. Dense calcified plaque and the geometry of the proximal left ICA precluded placement of the selected Exact stent, and the patient will remain on medical therapy. Angiogram of the right carotid system demonstrates high-grade stenosis of proximal right ICA secondary to advanced atherosclerotic changes. Although the angiographic measurement of the string sign is 73% stenosis by NASCET criteria, the degree of stenosis is favored to be greater, as the distal vessel is compromised in diameter given the low flow. Signed, Yvone Neu. Loreta Ave DO [facility] Radiology PLAN: Noncontrast head  CT postprocedure. ICU admission. Continue anti-platelet medication. Right common femoral 9 French sheath maintained which will be removed after 12 hours. Left common femoral 4 French sheath maintained for hemodynamic monitoring. Electronically Signed   By: Gilmer Mor D.O.   On: 02/16/2017 11:18    Labs:  CBC:  Recent Labs  02/21/17 0558 02/22/17 1630 02/28/17 0508 03/07/17 0740  WBC 5.9 6.1 5.1 5.2  HGB 12.4* 13.4 11.3* 13.7  HCT 35.7* 38.5* 33.3* 40.3  PLT 195 206 249 301    COAGS:  Recent Labs  02/16/17 0038 03/07/17 0740  INR 0.97 0.99  APTT 22* 25    BMP:  Recent Labs  02/19/17 0428 02/22/17 1630 02/28/17 0508 03/07/17 0740  NA 139 136 136 137  K 3.9 3.9 3.9 3.3*  CL 102 100* 100* 99*  CO2 26 28 28 27   GLUCOSE 112* 104* 126* 128*  BUN 15 25* 21* 20  CALCIUM 9.2 9.9 9.6 9.9  CREATININE 0.76 0.88 0.95 0.97  GFRNONAA >60 >60 >60 >60  GFRAA >60 >60 >60 >60    LIVER FUNCTION TESTS:  Recent Labs  02/16/17 0038 02/22/17 1630 03/07/17 0740  BILITOT 0.6 0.6 0.8  AST 30 36 28  ALT 29 41 41  ALKPHOS 33* 34* 36*  PROT 6.9 7.7 8.0  ALBUMIN 4.4 4.2 4.3    TUMOR MARKERS: No results for input(s):  AFPTM, CEA, CA199, CHROMGRNA in the last 8760 hours.  Assessment and Plan:  CVA L internal carotid artery angioplasty performed 02/16/17 in IR Unable to place stent; technical difficulty Now scheduled for R Internal carotid artery angioplasty/stent placement  Risks and Benefits discussed with the patient including, but not limited to bleeding, infection, vascular injury, contrast induced renal failure, stroke or even death. All of the patient's questions were answered, patient is agreeable to proceed. Consent signed and in chart.  Pt and wife aware if intervention is performed he will be admitted to Neuro ICU overnight for observation. Plan for discharge in am Agreeable   Thank you for this interesting consult.  I greatly enjoyed meeting Erik [name] and look forward to participating in their care.  A copy of this report was sent to the requesting provider on this date.  Electronically Signed: Kayleb Warshaw A 03/07/2017, 8:24 AM   I spent a total of  30 Minutes   in face to face in clinical consultation, greater than 50% of which was counseling/coordinating care for R ICA angioplasty/stent

## 2017-03-07 NOTE — Progress Notes (Signed)
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 Shellfish-Derived Products Hives  . Celecoxib Nausea And Vomiting    Patient Measurements: Height:  (180.3 cm) Weight: 192 lb 0.3 oz (87.1 kg) IBW/kg (Calculated) : 75.3 Heparin Dosing Weight: 87 kg  Vital Signs: Temp: 98 F (36.7 C) (05/02 2000) Temp Source: Oral (05/02 2000) BP: 123/65 (05/02 2200) Pulse Rate: 65 (05/02 2200)  Labs:  Recent Labs  03/07/17 0740 03/07/17 2217  HGB 13.7  --   HCT 40.3  --   PLT 301  --   APTT 25  --   LABPROT 13.1  --   INR 0.99  --   HEPARINUNFRC  --  0.45  CREATININE 0.97  --     Estimated Creatinine Clearance: 85.2 mL/min (by C-G formula based on SCr of 0.97 mg/dL).   Medical History: Past Medical History:  Diagnosis Date  . Anxiety   . Arthritis    hands  . BPH (benign prostatic hyperplasia)   . Depression   . 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 ED (erectile dysfunction)   . GERD (gastroesophageal reflux disease)   . H/O calcium pyrophosphate deposition disease (CPPD)   . HLD (hyperlipidemia)   . Hypertension   . Hypogonadism in male   . Lower urinary tract symptoms (LUTS)   . Nocturia   . Stroke [facility])    Language Impairment.  Left Hemiparesis- improved  . Testosterone overdose   . Vitamin D deficiency     Medications:  Infusions:  . sodium chloride 75 mL/hr at 03/07/17 2111  . clevidipine 5 mg/hr (03/07/17 2117)  . heparin 850 Units/hr (03/07/17 1700)    Assessment: 61 y/o male with in stent thrombosis of stent assisted angioplasty of right ICA s/p 18 mg IA Integrilin and 8 mg IA Reopro. Pharmacy consulted to begin IV heparin earlier today. Admit CBC was normal.  Tonight's intial 6 hour heparin level is 0.45, above goal of 0.1-0.25) on heparin 850 units/hr. No bleeding reported.  Will decrease  heparin rate. Heparin drip to stop @ 0700 tomorrow AM.   Goal of Therapy:  Heparin level 0.1-0.25 units/ml Monitor platelets by anticoagulation protocol: Yes   Plan:  Decrease heparin drip to 650 units/hr Monitor for s/sx of bleeding Heparin drip off at 07:00 tomorrow   [name], RPh Clinical Pharmacist Pager: [phone] 03/07/2017 10:57 PM

## 2017-03-07 NOTE — Progress Notes (Addendum)
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 Shellfish-Derived Products Hives  . Celecoxib Nausea And Vomiting    Patient Measurements: Height:  (180.3 cm) Weight: 212 lb (96.2 kg) IBW/kg (Calculated) : 75.3 Heparin Dosing Weight: 95 kg  Vital Signs: Temp: 97.5 F (36.4 C) (05/02 1320) Temp Source: Oral (05/02 0704) BP: 129/51 (05/02 0825) Pulse Rate: 68 (05/02 0825)  Labs:  Recent Labs  03/07/17 0740  HGB 13.7  HCT 40.3  PLT 301  APTT 25  LABPROT 13.1  INR 0.99  CREATININE 0.97    Estimated Creatinine Clearance: 94.7 mL/min (by C-G formula based on SCr of 0.97 mg/dL).   Medical History: Past Medical History:  Diagnosis Date  . Anxiety   . Arthritis    hands  . BPH (benign prostatic hyperplasia)   . Depression   . 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 ED (erectile dysfunction)   . GERD (gastroesophageal reflux disease)   . H/O calcium pyrophosphate deposition disease (CPPD)   . HLD (hyperlipidemia)   . Hypertension   . Hypogonadism in male   . Lower urinary tract symptoms (LUTS)   . Nocturia   . Stroke [facility])    Language Impairment.  Left Hemiparesis- improved  . Testosterone overdose   . Vitamin D deficiency     Medications:  Infusions:  . sodium chloride    . heparin 500 Units/hr (03/07/17 1335)    Assessment: 61 y/o male with in stent thrombosis of stent assisted angioplasty of right ICA s/p 18 mg IA Integrilin and 8 mg IA Reopro. Pharmacy consulted to begin IV heparin. Heparin drip was started at 550 units/hr at 13:35 today. No bleeding noted, CBC is normal.  Goal of Therapy:  Heparin level 0.1-0.25 units/ml Monitor platelets by anticoagulation protocol: Yes   Plan:  Increase heparin drip to 850 units/hr 6 hr heparin level Heparin drip off at 07:00  tomorrow   Loura Back, PharmD, BCPS Clinical Pharmacist Main pharmacy - [phone] 03/07/2017 2:42 PM

## 2017-03-07 NOTE — Procedures (Signed)
S/P 4 vessel cerebral arteriogram. RT CFA approach. Findings. 1.1.Occluded Lt ICA proc. 2.95% stenosis RT VA origin,and 80 % stenosis L:t VA origin. 3.95% stenosis Rt ICA proc treated with stent assisted angioplasty with distal protection. 4.Rescue thrombolysis of   acute intrastent thrombus with 18 mg of IA Integrelin and  of IA REopro with angiographic clearance.Marland Kitchen

## 2017-03-07 NOTE — Anesthesia Postprocedure Evaluation (Signed)
Anesthesia Post Note  Patient: Erik [name]  Procedure(s) Performed: Procedure(s) (LRB): CAROTID STENT (N/A)  Patient location during evaluation: PACU Anesthesia Type: MAC Level of consciousness: awake and alert Pain management: pain level controlled Vital Signs Assessment: post-procedure vital signs reviewed and stable Respiratory status: spontaneous breathing, nonlabored ventilation, respiratory function stable and patient connected to nasal cannula oxygen Cardiovascular status: blood pressure returned to baseline and stable Postop Assessment: no signs of nausea or vomiting Anesthetic complications: no       Last Vitals:  Vitals:   03/07/17 0704 03/07/17 1320  BP: (!) 155/72   Pulse: 71   Resp: 20   Temp: 36.7 C 36.4 C    Last Pain:  Vitals:   03/07/17 1320  TempSrc:   PainSc: 0-No pain                 Carianne Taira DAVID

## 2017-03-07 NOTE — Anesthesia Procedure Notes (Signed)
Procedure Name: MAC Date/Time: 03/07/2017 9:00 AM Performed by: Merrilyn Puma B Pre-anesthesia Checklist: Patient identified, Emergency Drugs available, Suction available, Patient being monitored and Timeout performed Patient Re-evaluated:Patient Re-evaluated prior to inductionOxygen Delivery Method: Simple face mask Placement Confirmation: positive ETCO2 and breath sounds checked- equal and bilateral Tube secured with: Tape Dental Injury: Teeth and Oropharynx as per pre-operative assessment

## 2017-03-07 NOTE — Transfer of Care (Signed)
Immediate Anesthesia Transfer of Care Note  Patient: Erik [name]  Procedure(s) Performed: Procedure(s): CAROTID STENT (N/A)  Patient Location: PACU  Anesthesia Type:MAC  Level of Consciousness: awake, alert  and oriented  Airway & Oxygen Therapy: Patient Spontanous Breathing and Patient connected to nasal cannula oxygen  Post-op Assessment: Report given to RN, Post -op Vital signs reviewed and stable and Patient moving all extremities X 4  Post vital signs: Reviewed and stable  Last Vitals:  Vitals:   03/07/17 0704 03/07/17 1320  BP: (!) 155/72   Pulse: 71   Resp: 20   Temp: 36.7 C 36.4 C    Last Pain:  Vitals:   03/07/17 0704  TempSrc: Oral      Patients Stated Pain Goal: 2 (61/60/73 7106)  Complications: No apparent anesthesia complications

## 2017-03-07 NOTE — H&P (Deleted)
  The note originally documented on this encounter has been moved the the encounter in which it belongs.  

## 2017-03-07 NOTE — Anesthesia Preprocedure Evaluation (Addendum)
Anesthesia Evaluation  Patient identified by MRN, date of birth, ID band Patient awake    Reviewed: Allergy & Precautions, NPO status , Patient's Chart, lab work & pertinent test results  History of Anesthesia Complications Negative for: history of anesthetic complications  Airway Mallampati: II  TM Distance: >3 FB Neck ROM: Full    Dental  (+) Caps, Dental Advisory Given   Pulmonary neg pulmonary ROS,    breath sounds clear to auscultation       Cardiovascular hypertension, Pt. on medications (-) angina Rhythm:Regular Rate:Normal  4/18 ECHO: EF 55-60%, valves OK   Neuro/Psych Anxiety Depression CVA (L hemiparesis, language), Residual Symptoms    GI/Hepatic Neg liver ROS, GERD  Medicated and Controlled,  Endo/Other  diabetes (glu 127), Oral Hypoglycemic Agents  Renal/GU negative Renal ROS     Musculoskeletal  (+) Arthritis , Osteoarthritis,    Abdominal   Peds  Hematology  (+) Blood dyscrasia (plavix), ,   Anesthesia Other Findings   Reproductive/Obstetrics                            Anesthesia Physical Anesthesia Plan  ASA: III  Anesthesia Plan: MAC   Post-op Pain Management:    Induction: Intravenous  Airway Management Planned: Natural Airway and Nasal Cannula  Additional Equipment: Arterial line  Intra-op Plan:   Post-operative Plan:   Informed Consent: I have reviewed the patients History and Physical, chart, labs and discussed the procedure including the risks, benefits and alternatives for the proposed anesthesia with the patient or authorized representative who has indicated his/her understanding and acceptance.   Dental advisory given  Plan Discussed with: CRNA and Surgeon  Anesthesia Plan Comments: (Plan routine monitors, A-line, MAC)        Anesthesia Quick Evaluation

## 2017-03-08 ENCOUNTER — Encounter: Payer: Managed Care, Other (non HMO) | Admitting: Speech Pathology

## 2017-03-08 ENCOUNTER — Ambulatory Visit: Payer: Managed Care, Other (non HMO) | Admitting: Physical Therapy

## 2017-03-08 ENCOUNTER — Encounter (HOSPITAL_COMMUNITY): Payer: Self-pay | Admitting: Interventional Radiology

## 2017-03-08 ENCOUNTER — Inpatient Hospital Stay (HOSPITAL_COMMUNITY): Payer: Managed Care, Other (non HMO)

## 2017-03-08 ENCOUNTER — Encounter: Payer: Managed Care, Other (non HMO) | Admitting: Occupational Therapy

## 2017-03-08 DIAGNOSIS — I639 Cerebral infarction, unspecified: Secondary | ICD-10-CM

## 2017-03-08 LAB — CBC WITH DIFFERENTIAL/PLATELET
BASOS ABS: 0 10*3/uL (ref 0.0–0.1)
Basophils Relative: 0 %
Eosinophils Absolute: 0 10*3/uL (ref 0.0–0.7)
Eosinophils Relative: 0 %
HEMATOCRIT: 29.9 % — AB (ref 39.0–52.0)
Hemoglobin: 10.2 g/dL — ABNORMAL LOW (ref 13.0–17.0)
LYMPHS ABS: 0.9 10*3/uL (ref 0.7–4.0)
LYMPHS PCT: 12 %
MCH: 30.3 pg (ref 26.0–34.0)
MCHC: 34.1 g/dL (ref 30.0–36.0)
MCV: 88.7 fL (ref 78.0–100.0)
MONO ABS: 0.6 10*3/uL (ref 0.1–1.0)
MONOS PCT: 8 %
NEUTROS ABS: 6.1 10*3/uL (ref 1.7–7.7)
Neutrophils Relative %: 80 %
Platelets: 265 10*3/uL (ref 150–400)
RBC: 3.37 MIL/uL — ABNORMAL LOW (ref 4.22–5.81)
RDW: 12.7 % (ref 11.5–15.5)
WBC: 7.6 10*3/uL (ref 4.0–10.5)

## 2017-03-08 LAB — BASIC METABOLIC PANEL
Anion gap: 10 (ref 5–15)
BUN: 17 mg/dL (ref 6–20)
CO2: 23 mmol/L (ref 22–32)
Calcium: 8.9 mg/dL (ref 8.9–10.3)
Chloride: 102 mmol/L (ref 101–111)
Creatinine, Ser: 0.69 mg/dL (ref 0.61–1.24)
GLUCOSE: 125 mg/dL — AB (ref 65–99)
Potassium: 3.8 mmol/L (ref 3.5–5.1)
SODIUM: 135 mmol/L (ref 135–145)

## 2017-03-08 LAB — GLUCOSE, CAPILLARY
GLUCOSE-CAPILLARY: 121 mg/dL — AB (ref 65–99)
GLUCOSE-CAPILLARY: 142 mg/dL — AB (ref 65–99)

## 2017-03-08 LAB — VAS US CAROTID
LCCAPSYS: 104 cm/s
LEFT ECA DIAS: -13 cm/s
LEFT VERTEBRAL DIAS: -12 cm/s
LICADDIAS: -12 cm/s
LICAPSYS: -127 cm/s
Left CCA dist dias: -11 cm/s
Left CCA dist sys: -69 cm/s
Left CCA prox dias: 11 cm/s
Left ICA dist sys: -60 cm/s
Left ICA prox dias: -19 cm/s
RIGHT ECA DIAS: -19 cm/s
RIGHT VERTEBRAL DIAS: 14 cm/s
Right CCA prox dias: 31 cm/s
Right CCA prox sys: 83 cm/s
Right cca dist sys: -240 cm/s

## 2017-03-08 LAB — PLATELET INHIBITION P2Y12: Platelet Function  P2Y12: 266 [PRU] (ref 194–418)

## 2017-03-08 MED ORDER — TICAGRELOR 90 MG PO TABS: 90.0000 mg | ORAL_TABLET | Freq: Two times a day (BID) | ORAL | 1 refills | Status: AC

## 2017-03-08 MED ORDER — TICAGRELOR 90 MG PO TABS
90.0000 mg | ORAL_TABLET | Freq: Two times a day (BID) | ORAL | Status: DC
  Administered 2017-03-08: 90 mg via ORAL
  Filled 2017-03-08: qty 1

## 2017-03-08 NOTE — Progress Notes (Signed)
Rt 7Fr Sheath removed using Exoseal and manual pressure at 0935.  Sheath intact, hemostasis obtained at 952.   No complications, distal pulses intact.  Site reviewed with ArtistN Megan.  Pressure dressing applied.

## 2017-03-08 NOTE — Discharge Instructions (Signed)
Angiogram, Care After This sheet gives you information about how to care for yourself after your procedure. Your health care provider may also give you more specific instructions. If you have problems or questions, contact your health care provider. What can I expect after the procedure? After the procedure, it is common to have bruising and tenderness at the catheter insertion area. Follow these instructions at home: Insertion site care   Follow instructions from your health care provider about how to take care of your insertion site. Make sure you:  Wash your hands with soap and water before you change your bandage (dressing). If soap and water are not available, use hand sanitizer.  Change your dressing as told by your health care provider.  You may removed the bandage on 5/4 and place a band-aid over the site   Do not take baths, swim, or use a hot tub until your health care provider approves.  You may shower 24 hours after the procedure or as told by your health care provider.  Gently wash the site with plain soap and water.  Pat the area dry with a clean towel.  Do not rub the site. This may cause bleeding.  Do not apply powder or lotion to the site. Keep the site clean and dry.  Check your insertion site every day for signs of infection. Check for:  Redness, swelling, or pain.  Fluid or blood.  Warmth.  Pus or a bad smell. Activity   Rest as told by your health care provider, usually for 1-2 days.  Do not lift anything that is heavier than 10 lbs. (4.5 kg) or as told by your health care provider.  Do not drive for 2 weeks  Do not drive or use heavy machinery while taking prescription pain medicine. General instructions   Return to your normal activities as told by your health care provider, usually in about a week. Ask your health care provider what activities are safe for you.  If the catheter site starts bleeding, lie flat and put pressure on the site. If the  bleeding does not stop, get help right away. This is a medical emergency.  Drink enough fluid to keep your urine clear or pale yellow. This helps flush the contrast dye from your body.  Take over-the-counter and prescription medicines only as told by your health care provider.  Keep all follow-up visits as told by your health care provider. This is important. Contact a health care provider if:  You have a fever or chills.  You have redness, swelling, or pain around your insertion site.  You have fluid or blood coming from your insertion site.  The insertion site feels warm to the touch.  You have pus or a bad smell coming from your insertion site.  You have bruising around the insertion site.  You notice blood collecting in the tissue around the catheter site (hematoma). The hematoma may be painful to the touch. Get help right away if:  You have severe pain at the catheter insertion area.  The catheter insertion area swells very fast.  The catheter insertion area is bleeding, and the bleeding does not stop when you hold steady pressure on the area.  The area near or just beyond the catheter insertion site becomes pale, cool, tingly, or numb. These symptoms may represent a serious problem that is an emergency. Do not wait to see if the symptoms will go away. Get medical help right away. Call your local emergency services (911  in the [place] Do not drive yourself to the hospital. Summary  After the procedure, it is common to have bruising and tenderness at the catheter insertion area.  After the procedure, it is important to rest and drink plenty of fluids.  Do not take baths, swim, or use a hot tub until your health care provider says it is okay to do so. You may shower 24-48 hours after the procedure or as told by your health care provider.  If the catheter site starts bleeding, lie flat and put pressure on the site. If the bleeding does not stop, get help right away. This is a  medical emergency. This information is not intended to replace advice given to you by your health care provider. Make sure you discuss any questions you have with your health care provider. Document Released: 05/11/2005 Document Revised: 09/27/2016 Document Reviewed: 09/27/2016 Elsevier Interactive Patient Education  2017 [employer].

## 2017-03-08 NOTE — Progress Notes (Signed)
Preliminary results by tech - Carotid Duplex Completed. Status post right ICA stent is patent with turbulent flow with elevated velocity (360/118 cm/s) suggestive of 50-75% stenosis. The left ICA is a known occluded vessel.  Marilynne Halstedita Elizibeth Breau, BS, RDMS, RVT

## 2017-03-08 NOTE — Discharge Summary (Signed)
Patient ID: MINDY BEHNKEN MRN: [id number] DOB/AGE: [birth year] 62 y.o.  Admit date: 03/07/2017 Discharge date: 03/08/2017  Supervising Physician: Julieanne Cotton  Admission Diagnoses: carotid artery stenosis  Discharge Diagnoses:  Active Problems:   Carotid artery stenosis with cerebral infarction [facility])   Discharged Condition: good  Hospital Course: The patient was admitted and underwent cerebral angiogram with Rt ICA stent assisted angioplasty with distal protection and rescue thrombolysis of acute intrastent thrombus with 18 mg of IA Integrelin and 8mg  of IA REopro with angiographic clearance.  The patient tolerated the procedure well.  He remains at baseline post procedure.  On POD 1, all of his lines were removed.  His P2Y12 was 266 and therefore his plavix was discontinued and he was started on Brilinta 90mg  BID.  He will undergo a carotid duplex prior to discharge as well.  He was otherwise voiding well, tolerating a diet, and mobilizing with no other complaints.  He was stable for DC home.  Consults: None  Discharge Exam: Blood pressure (!) 127/59, pulse 68, temperature 98 F (36.7 C), temperature source Oral, resp. rate 18, height 5\' 11"  (1.803 m), weight 192 lb 0.3 oz (87.1 kg), SpO2 95 %. General appearance: alert, cooperative and no distress Resp: clear to auscultation bilaterally Cardio: regular rate and rhythm Neurologic: Alert and oriented X 3, baseline strength and tone. Normal finger nose finger test. EOMI. Still with slurring of his speech (baseline) and some facial droop which is also baseline. Skin: R CFA site with sheath still in place at this time with minimal ooze noted on gauze.  palpable pedal pulses.  Disposition: 01-Home or Self Care   Allergies as of 03/08/2017      Reactions   Shellfish-derived Products Hives   Celecoxib Nausea And Vomiting      Medication List    STOP taking these medications   clopidogrel 75 MG tablet Commonly known as:   PLAVIX     TAKE these medications   acetaminophen 325 MG tablet Commonly known as:  TYLENOL Take 2 tablets (650 mg total) by mouth every 4 (four) hours as needed for mild pain.   amLODipine 10 MG tablet Commonly known as:  NORVASC Take 1 tablet (10 mg total) by mouth daily.   aspirin 325 MG EC tablet Take 1 tablet (325 mg total) by mouth daily.   CRESTOR 40 MG tablet Generic drug:  rosuvastatin Take 1 tablet (40 mg total) by mouth daily.   FLUoxetine 20 MG capsule Commonly known as:  PROZAC Take 1 capsule (20 mg total) by mouth daily.   hydrochlorothiazide 25 MG tablet Commonly known as:  HYDRODIURIL Take 1 tablet (25 mg total) by mouth daily.   losartan 25 MG tablet Commonly known as:  COZAAR Take 1 tablet (25 mg total) by mouth daily.   Melatonin 3 MG Tabs Take 1 tablet (3 mg total) by mouth at bedtime as needed (sleep).   metFORMIN 500 MG tablet Commonly known as:  GLUCOPHAGE Take 1 tablet (500 mg total) by mouth daily with breakfast.   multivitamin tablet Take 1 tablet by mouth daily.   pantoprazole 40 MG tablet Commonly known as:  PROTONIX Take 1 tablet (40 mg total) by mouth at bedtime.   tamsulosin 0.4 MG Caps capsule Commonly known as:  FLOMAX Take 1 capsule (0.4 mg total) by mouth daily after supper.   ticagrelor 90 MG Tabs tablet Commonly known as:  BRILINTA Take 1 tablet (90 mg total) by mouth 2 (two) times  daily.      Follow-up Information    DEVESHWAR, Grandville SilosSANJEEV K, MD Follow up in 2 week(s).   Specialty:  Interventional Radiology Why:  Victorino DikeJennifer will call you with your follow up appointment date and time Contact information: [address] [place] [address] [phone]            Electronically Signed: Barnetta ChapelSBORNE,Rilen Shukla E 03/08/2017, 10:58 AM   I have spent Less Than 30 Minutes discharging Keturah BarreJames W Turvey.

## 2017-03-08 NOTE — Care Management Note (Signed)
Case Management Note  Patient Details  Name: Erik BarreJames W Fridman MRN: [id number] Date of Birth: Mar 17, 1956  Subjective/Objective:  Patient was admitted and underwent cerebral angiogram with Rt ICA stent assisted angioplasty on 03/07/17.  PTA, pt resided at home with spouse.  [facility], and has since been receiving outpatient therapies at [facility].               Action/Plan: Pt for discharge later today; no dc needs identified.    Expected Discharge Date:  03/08/17               Expected Discharge Plan:  Home/Self Care  In-House Referral:     Discharge planning Services  CM Consult  Post Acute Care Choice:    Choice offered to:     DME Arranged:    DME Agency:     HH Arranged:    HH Agency:     Status of Service:  Completed, signed off  If discussed at [employer] Length of Stay Meetings, dates discussed:    Additional Comments:  Quintella BatonJulie W. Kaydyn Sayas, RN, BSN  Trauma/Neuro ICU Case Manager [phone]

## 2017-03-12 ENCOUNTER — Encounter (HOSPITAL_COMMUNITY): Payer: Self-pay | Admitting: Interventional Radiology

## 2017-03-12 ENCOUNTER — Ambulatory Visit: Payer: Managed Care, Other (non HMO) | Admitting: Speech Pathology

## 2017-03-12 ENCOUNTER — Ambulatory Visit: Payer: Managed Care, Other (non HMO) | Attending: Physician Assistant | Admitting: Occupational Therapy

## 2017-03-12 ENCOUNTER — Ambulatory Visit: Payer: Managed Care, Other (non HMO)

## 2017-03-12 DIAGNOSIS — R4701 Aphasia: Secondary | ICD-10-CM | POA: Insufficient documentation

## 2017-03-12 DIAGNOSIS — R471 Dysarthria and anarthria: Secondary | ICD-10-CM | POA: Diagnosis present

## 2017-03-12 DIAGNOSIS — I69351 Hemiplegia and hemiparesis following cerebral infarction affecting right dominant side: Secondary | ICD-10-CM | POA: Diagnosis present

## 2017-03-12 DIAGNOSIS — R2681 Unsteadiness on feet: Secondary | ICD-10-CM | POA: Insufficient documentation

## 2017-03-12 DIAGNOSIS — M6281 Muscle weakness (generalized): Secondary | ICD-10-CM | POA: Diagnosis not present

## 2017-03-12 DIAGNOSIS — R278 Other lack of coordination: Secondary | ICD-10-CM | POA: Diagnosis present

## 2017-03-12 NOTE — Therapy (Addendum)
[facility] MAIN [facility] SERVICES [address] [place], [place], [address] Phone: [phone]   Fax:  [phone]  Occupational Therapy Evaluation  Patient Details  Name: Erik [name] MRN: [id number] Date of Birth: 19-Jan-1956 Referring Provider: Dr. Hildred Alamin  Encounter Date: 03/12/2017      OT End of Session - 03/12/17 1012    Visit Number 1   Number of Visits 24   Date for OT Re-Evaluation 06/04/17   OT Start Time 1000   OT Stop Time 1105   OT Time Calculation (min) 65 min   Activity Tolerance Patient tolerated treatment well   Behavior During Therapy [facility] for tasks assessed/performed      Past Medical History:  Diagnosis Date  . Anxiety   . Arthritis    hands  . BPH (benign prostatic hyperplasia)   . Depression   . 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 ED (erectile dysfunction)   . GERD (gastroesophageal reflux disease)   . H/O calcium pyrophosphate deposition disease (CPPD)   . HLD (hyperlipidemia)   . Hypertension   . Hypogonadism in male   . Lower urinary tract symptoms (LUTS)   . Nocturia   . Stroke [facility])    Language Impairment.  Left Hemiparesis- improved  . Testosterone overdose   . Vitamin D deficiency     Past Surgical History:  Procedure Laterality Date  . BACK SURGERY    . COLON SURGERY     Colon Resection  . COLONOSCOPY    . IR ANGIO INTRA EXTRACRAN SEL COM CAROTID INNOMINATE UNI L MOD SED  03/07/2017  . IR ANGIO INTRA EXTRACRAN SEL COM CAROTID INNOMINATE UNI R MOD SED  02/16/2017  . IR ANGIO VERTEBRAL SEL SUBCLAVIAN INNOMINATE UNI L MOD SED  03/07/2017  . IR ANGIO VERTEBRAL SEL VERTEBRAL UNI R MOD SED  03/07/2017  . IR INFUSION THROMBOL ARTERIAL INITIAL (MS)  03/07/2017  . IR INTRAVSC STENT CERV CAROTID W/EMB-PROT MOD SED INCL ANGIO  03/07/2017  . IR PERCUTANEOUS ART THROMBECTOMY/INFUSION INTRACRANIAL INC DIAG ANGIO  02/16/2017  . IR PTA NON CORO-LOWER EXTREM  02/16/2017  . IR US GUIDE VASC  ACCESS LEFT  02/16/2017  . IR US GUIDE VASC ACCESS RIGHT  02/16/2017  . KNEE ARTHROSCOPY Bilateral   . RADIOLOGY WITH ANESTHESIA N/A 02/16/2017   Procedure: RADIOLOGY WITH ANESTHESIA;  Surgeon: Julieanne Cotton, MD;  Location: [facility] OR;  Service: Radiology;  Laterality: N/A;  . RADIOLOGY WITH ANESTHESIA N/A 03/07/2017   Procedure: CAROTID STENT;  Surgeon: Julieanne Cotton, MD;  Location: [facility] OR;  Service: Radiology;  Laterality: N/A;  . ROTATOR CUFF REPAIR Right     There were no vitals filed for this visit.      Subjective Assessment - 03/12/17 1209    Subjective  Pt. walked into the gym while holding his cane up with both hands.   Patient is accompained by: Family member   Pertinent History Pt. is a 61 y.o. male who had a CVA on the evening of April 12th, 2018. Pt. had been to his grandson's ball game, and was preparing for bed when he felling getting into bed. According to his wife, he struggled to get up. She could tell he was having a stroke, and called EMS. Pt. was transported to [facility],, was administered TPA,. Pt. underwent a cerebral angiogram with Rt ICA stent assisted angioplasty. Pt. received Therapy services in [facility]. at [facility]   Patient Stated Goals  To regain the use of his right hand   Currently in Pain? No/denies            [facility] OT Assessment - 03/12/17 1012      Assessment   Diagnosis CVA   Referring Provider Dr. Hildred Alamin   Onset Date 02/15/17   Prior Therapy OT, PT, and ST in patient rehab.     Precautions   Precautions --  No heavy lifting, or strenuous activity.     Restrictions   Weight Bearing Restrictions No     Balance Screen   Has the patient fallen in the past 6 months Yes   How many times? 1   Has the patient had a decrease in activity level because of a fear of falling?  No   Is the patient reluctant to leave their home because of a fear of falling?  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, working on old cars     ADL   Eating/Feeding Minimal assistance   Grooming Minimal assistance   Upper Body Bathing Minimal assistance   Lower Body Bathing Increased time   Upper Body Dressing Increased time   Lower Body Dressing Increased time   Nurse, children's Supervision/safety     IADL   Shopping Needs to be accompanied on any shopping trip   [employer] Housekeeping Needs help with all home maintenance tasks   Meal Prep Needs to have meals prepared and served   [employer] on family or friends for transportation   Medication Management Is not capable of dispensing or managing own medication   Financial Management Requires assistance     Written Expression   Dominant Hand Right   Handwriting Increased time;90% legible     Vision - History   Baseline Vision Wears glasses all the time   Patient Visual Report Peripheral vision impairment  right sided awareness, Reports bumps into obstacles on the R   Additional Comments Vision to be further assessed ifor funcional tasks.     Activity Tolerance   Activity Tolerance Tolerates 10-20 min activity with muiltiple rests     Cognition   Overall Cognitive Status Within Functional Limits for tasks assessed   Memory Impaired     Sensation   Light Touch Appears Intact   Proprioception Appears Intact     Coordination   Right 9 Hole Peg Test . & 4 sec   Left 9 Hole Peg Test 22  sec.     AROM   Overall AROM Comments RUE: shoulder flexion: 120 degrees, abduction 70 degrees. LUE: WFL     Strength   Overall Strength Comments RUE: shoulder flexion 3/5, abduction 3-/5, elbow flexion/extension 5/5, forearm supination/pronation, and writs extension is 5/5.     Hand Function   Right Hand Grip (lbs) 62   Right Hand Lateral Pinch 13 lbs   Right Hand  3 Point Pinch 13 lbs   Left Hand Grip (lbs) 78   Left Hand Lateral Pinch 19 lbs   Left 3 point pinch 18 lbs                               OT Long Term Goals - 03/12/17 1228      OT LONG TERM GOAL #1   Title Pt. will independently, and efficiently write 3 sentences with 100% legibility.   Baseline Increased time required. 90% legibility for only 2 words in large print.   Time 12   Period Weeks   Status New     OT LONG TERM GOAL #2   Title Pt. will independently type a paragraph efficiently.   Baseline Pt. is unable   Time 12   Period Weeks   Status New     OT LONG TERM GOAL #3   Title Pt. improve right grip strength by 5 pounds to be able to hold and use a wrench.   Baseline Pt. is unable   Time 12   Period Weeks   Status New     OT LONG TERM GOAL #4   Title Pt. will improve right lateral pinch strength by 3# to be able to put toothpaste on a toothbrush.   Baseline Pt. is unable to apply toothpaste onto a toothbrush.   Time 12   Period Weeks   Status New     OT LONG TERM GOAL #5   Title Pt. will improve FMC by 10 sec. to be able to independently handle, and count change.   Baseline Pt. has difficulty   Time 12   Period Weeks   Status New     Long Term Additional Goals   Additional Long Term Goals Yes     OT LONG TERM GOAL #6   Title Pt. will increase RUE strength by 2mm grades to be able to complete ADLs/IADLs.   Baseline Pt. has difficulty   Time 12   Period Weeks   Status New     OT LONG TERM GOAL #7   Title Pt. will demonstrate independence with visual compensatory  strategies for the right 100% of the time during ADL, and IADL tasks.   Baseline Limited on the right. Pt. occ. misses obstacle on the right.   Time 12   Period Weeks   Status New               Plan - 03/12/17 1220    Clinical Impression Statement Pt. is a 61 y.o. male who was admiited to [facility] health following a CVA. Pt. was a candidate for TPA. Pt. presents with right sided weakness, right sided visual impairments, impaired motor control, coordination skills, speed, and dexterity which hinders his ability to perform ADL, and IADL tasks efficiently with his dominat RUE. Pt. scored a 47 sum score on the MAM-20. Pt. could benefit from skilled OT services to work towards engaging his right hand during ADL, and IADL tasks.   Rehab Potential Good   OT Frequency 2x / week   OT Duration 12 weeks   OT Treatment/Interventions Self-care/ADL training;Therapeutic exercise;Therapeutic exercises;Therapeutic activities;DME and/or AE instruction;Neuromuscular education;Patient/family education;Functional Mobility Training;Cognitive remediation/compensation;Visual/perceptual remediation/compensation;Energy conservation   Consulted and Agree with Plan of Care Patient;Family member/caregiver   Family Member Consulted Wife      Patient will benefit from skilled therapeutic intervention in order to improve the following deficits and impairments:  Decreased coordination, Decreased balance, Impaired UE functional use, Decreased cognition, Impaired vision/preception, Decreased strength, Decreased knowledge of use of DME, Decreased activity tolerance, Impaired tone, Impaired flexibility, Decreased range of motion, Decreased endurance  Visit Diagnosis: Muscle weakness (generalized) - Plan: Ot plan of care cert/re-cert  Other lack of coordination - Plan: Ot plan of care cert/re-cert    Problem List Patient Active Problem List   Diagnosis Date Noted  . Carotid artery stenosis with cerebral infarction (HCC)  03/07/2017  . Language impairment   . ICAO (internal carotid artery occlusion), left 02/22/2017  . Acute respiratory failure (HCC) 02/22/2017  . Obesity 02/22/2017  . Expressive aphasia 02/22/2017  . Acute ischemic left MCA stroke (HCC) 02/22/2017  . Left hemiparesis (HCC)   . Status post total bilateral knee replacement   . Benign essential HTN   . Right-sided extracranial carotid artery stenosis   . Dysphagia, post-stroke   . Acute blood loss anemia   . Nocturia   . Newly diagnosed diabetes (HCC)   . CVA (cerebral vascular accident) (HCC) - L MCA s/p tPA and thrombectomy 02/16/2017  . Anxiety 04/30/2015  . Benign fibroma of prostate 04/30/2015  . Clinical depression 04/30/2015  . Acid reflux 04/30/2015  . HLD (hyperlipidemia) 04/30/2015  . BP (high blood pressure) 04/30/2015  . Arthritis, degenerative 04/30/2015  . 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 Arthritis, septic, knee (HCC) 09/26/2012    Olegario MessierElaine Ralph Benavidez, MS, OTR/L 03/12/2017, 12:44 PM  [facility] [address] [place], [place], [address] Phone: [phone]   Fax:  [phone]  Name: Erik [name] MRN: [id number] Date of Birth: 09-28-1956

## 2017-03-13 ENCOUNTER — Encounter: Payer: Self-pay | Admitting: Speech Pathology

## 2017-03-13 ENCOUNTER — Telehealth: Payer: Self-pay | Admitting: Physical Medicine & Rehabilitation

## 2017-03-13 NOTE — Therapy (Signed)
[place] [facility] [address] [place], [place], [address] Phone: [phone]   Fax:  [phone]  Speech Language Pathology Evaluation  Patient Details  Name: Erik [name] MRN: [id number] Date of Birth: [birth year] Referring Provider: Charlton AmorANGIULLI, DANIEL J   Encounter Date: 03/12/2017      End of Session - 03/13/17 1423    Visit Number 1   Number of Visits 25   Date for SLP Re-Evaluation 06/12/17   SLP Start Time 1100   SLP Stop Time  1200   SLP Time Calculation (min) 60 min   Activity Tolerance Patient tolerated treatment well      Past Medical History:  Diagnosis Date  . Anxiety   . Arthritis    hands  . BPH (benign prostatic hyperplasia)   . Depression   . 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 ED (erectile dysfunction)   . GERD (gastroesophageal reflux disease)   . H/O calcium pyrophosphate deposition disease (CPPD)   . HLD (hyperlipidemia)   . Hypertension   . Hypogonadism in male   . Lower urinary tract symptoms (LUTS)   . Nocturia   . Stroke [facility](HCC)    Language Impairment.  Left Hemiparesis- improved  . Testosterone overdose   . Vitamin D deficiency     Past Surgical History:  Procedure Laterality Date  . BACK SURGERY    . COLON SURGERY     Colon Resection  . COLONOSCOPY    . IR ANGIO INTRA EXTRACRAN SEL COM CAROTID INNOMINATE UNI L MOD SED  03/07/2017  . IR ANGIO INTRA EXTRACRAN SEL COM CAROTID INNOMINATE UNI R MOD SED  02/16/2017  . IR ANGIO VERTEBRAL SEL SUBCLAVIAN INNOMINATE UNI L MOD SED  03/07/2017  . IR ANGIO VERTEBRAL SEL VERTEBRAL UNI R MOD SED  03/07/2017  . IR INFUSION THROMBOL ARTERIAL INITIAL (MS)  03/07/2017  . IR INTRAVSC STENT CERV CAROTID W/EMB-PROT MOD SED INCL ANGIO  03/07/2017  . IR PERCUTANEOUS ART THROMBECTOMY/INFUSION INTRACRANIAL INC DIAG ANGIO  02/16/2017  . IR PTA NON CORO-LOWER EXTREM  02/16/2017  . IR US GUIDE VASC ACCESS LEFT  02/16/2017  . IR US GUIDE VASC  ACCESS RIGHT  02/16/2017  . KNEE ARTHROSCOPY Bilateral   . RADIOLOGY WITH ANESTHESIA N/A 02/16/2017   Procedure: RADIOLOGY WITH ANESTHESIA;  Surgeon: Julieanne CottonSanjeev Deveshwar, MD;  Location: [facility] OR;  Service: Radiology;  Laterality: N/A;  . RADIOLOGY WITH ANESTHESIA N/A 03/07/2017   Procedure: CAROTID STENT;  Surgeon: Julieanne CottonSanjeev Deveshwar, MD;  Location: [facility] OR;  Service: Radiology;  Laterality: N/A;  . ROTATOR CUFF REPAIR Right     There were no vitals filed for this visit.          SLP Evaluation [facility] - 03/13/17 0001      SLP Visit Information   SLP Received On 03/12/17   Referring Provider Mariam DollarANGIULLI, DANIEL J    Onset Date 02/16/2017   Medical Diagnosis Left MCA CVA     Subjective   Subjective "Too many words"   Patient/Family Stated Goal Return to usual communication abilities     Pain Assessment   Currently in Pain? No/denies     General Information   HPI 61 year old man with left MCA CVA 02/15/2017.  Patient received inpatient rehab services, including SLP, 02/22/2017-03/02/2017.     Prior Functional Status   Cognitive/Linguistic Baseline Within functional limits     Auditory Comprehension   Overall Auditory Comprehension Appears within  functional limits for tasks assessed     Reading Comprehension   Reading Status Impaired     Verbal Expression   Overall Verbal Expression Impaired   Other Verbal Expression Comments Mild word-finding deficits in conversation      Written Expression   Dominant Hand Right   Written Expression Exceptions to [facility]   Overall Writen Expression --  Deficits appear to be primairly fine motor with mild spellin     Oral Motor/Sensory Function   Overall Oral Motor/Sensory Function Impaired   Labial ROM Reduced right   Lingual ROM Reduced right   Facial ROM Reduced right     Motor Speech   Overall Motor Speech Impaired   Respiration Within functional limits   Phonation Normal   Resonance Within functional limits   Articulation Impaired   Level of  Impairment Sentence   Intelligibility Intelligibility reduced   Word 75-100% accurate   Phrase 75-100% accurate   Sentence 25-49% accurate   Conversation 25-49% accurate   Phonation WFL     Standardized Assessments   Standardized Assessments  Western Aphasia Battery revised       Spontaneous Speech      Information content  8/10       Fluency   5/10     Comprehension     Yes/No questions  57/60        Auditory Word Recognition 60/60        Sequential Commands 80/80    Repetition   94/100     Naming    Object Naming  60/60        Word Fluency   11/20        Sentence Completion 10/10        Responsive Speech   10/10     Aphasia Quotient  82.7/100   Reading and Writing    Reading   80/100        Writing   did not complete   Language Quotient  N/A         SLP Education - 03/13/17 1423    Education provided Yes   Education Details Recommended goals for outpatient SLP   Person(s) Educated Patient;Spouse   Methods Explanation   Comprehension Verbalized understanding            SLP Long Term Goals - 03/13/17 1425      SLP LONG TERM GOAL #1   Title Pt will improve speech intelligibility in response to moderately complex cognitive linguistic tasks by controlling rate of speech, over-articulation, and increased loudness to achieve 80% intelligibility.   Time 12   Period Weeks   Status New     SLP LONG TERM GOAL #2   Title Patient will read aloud sentences and multi-syllabic words, maintaining phonemic accuracy, with 80% accuracy.     Time 12   Period Weeks   Status New     SLP LONG TERM GOAL #3   Title Patient will demonstrate reading comprehension for paragraphs with 80% accuracy.    Time 12   Period Weeks   Status New     SLP LONG TERM GOAL #4   Title Patient will complete high level word finding tasks with 80% accuracy.   Time 12   Period Weeks   Status New          Plan - 03/13/17 1424    Clinical Impression Statement : At 3  weeks post onset  of left MCA CVA, the patient is presenting with  mild aphasia and moderate dysarthria characterized by imprecise articulation, increased rate of speech, word finding deficits, and reading impairment.  The patient will benefit from skilled speech therapy for restorative and compensatory treatment of motor speech, word finding, and reading impairments.   Speech Therapy Frequency 2x / week   Duration Other (comment)  12 weeks   Potential to Achieve Goals Good   Potential Considerations Ability to learn/carryover information;Co-morbidities;Cooperation/participation level;Medical prognosis;Previous level of function;Severity of impairments;Family/community support   SLP Home Exercise Plan To be determined   Consulted and Agree with Plan of Care Patient;Family member/caregiver   Family Member Consulted Spouse      Patient will benefit from skilled therapeutic intervention in order to improve the following deficits and impairments:   Dysarthria and anarthria - Plan: SLP plan of care cert/re-cert  Aphasia - Plan: SLP plan of care cert/re-cert    Problem List Patient Active Problem List   Diagnosis Date Noted  . Carotid artery stenosis with cerebral infarction (HCC) 03/07/2017  . Language impairment   . ICAO (internal carotid artery occlusion), left 02/22/2017  . Acute respiratory failure (HCC) 02/22/2017  . Obesity 02/22/2017  . Expressive aphasia 02/22/2017  . Acute ischemic left MCA stroke (HCC) 02/22/2017  . Left hemiparesis (HCC)   . Status post total bilateral knee replacement   . Benign essential HTN   . Right-sided extracranial carotid artery stenosis   . Dysphagia, post-stroke   . Acute blood loss anemia   . Nocturia   . Newly diagnosed diabetes (HCC)   . CVA (cerebral vascular accident) (HCC) - L MCA s/p tPA and thrombectomy 02/16/2017  . Anxiety 04/30/2015  . Benign fibroma of prostate 04/30/2015  . Clinical depression 04/30/2015  . Acid reflux 04/30/2015  . HLD  (hyperlipidemia) 04/30/2015  . BP (high blood pressure) 04/30/2015  . Arthritis, degenerative 04/30/2015  . 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 Arthritis, septic, knee (HCC) 09/26/2012   Dollene Primrose, MS/CCC- SLP  Leandrew Koyanagi 03/13/2017, 2:31 PM  [facility] MAIN [facility] SERVICES [address] [place], [place], [address] Phone: [phone]   Fax:  [phone]  Name: Erik [name] MRN: [id number] Date of Birth: [birth year]

## 2017-03-13 NOTE — Telephone Encounter (Signed)
Wife wendy t/o got a form for [employer] - needing more [facility] - advised she can contact social worker IP or bring it to appt Friday I will forward over.

## 2017-03-14 ENCOUNTER — Ambulatory Visit: Payer: Managed Care, Other (non HMO) | Admitting: Occupational Therapy

## 2017-03-14 ENCOUNTER — Encounter: Payer: Managed Care, Other (non HMO) | Admitting: Speech Pathology

## 2017-03-14 ENCOUNTER — Ambulatory Visit: Payer: Managed Care, Other (non HMO)

## 2017-03-14 VITALS — BP 112/62 | HR 86

## 2017-03-14 DIAGNOSIS — R2681 Unsteadiness on feet: Secondary | ICD-10-CM

## 2017-03-14 DIAGNOSIS — R278 Other lack of coordination: Secondary | ICD-10-CM

## 2017-03-14 DIAGNOSIS — M6281 Muscle weakness (generalized): Secondary | ICD-10-CM | POA: Diagnosis not present

## 2017-03-14 DIAGNOSIS — I69351 Hemiplegia and hemiparesis following cerebral infarction affecting right dominant side: Secondary | ICD-10-CM

## 2017-03-14 NOTE — Therapy (Signed)
[facility]IN [facility] [address] [place], [place], [address] Phone: [phone]   Fax:  [phone]  Occupational Therapy Treatment  Patient Details  Name: Erik [name] MRN: [id number] Date of Birth: Nov 16, 1955 Referring Provider: Dr. Hildred AlaminAngiulli  Encounter Date: 03/14/2017      OT End of Session - 03/14/17 1039    Visit Number 2   Number of Visits 24   Date for OT Re-Evaluation 06/04/17   OT Start Time 0930   OT Stop Time 1015   OT Time Calculation (min) 45 min   Activity Tolerance Patient tolerated treatment well   Behavior [facility] for tasks assessed/performed      Past Medical History:  Diagnosis Date  . Anxiety   . Arthritis    hands  . BPH (benign prostatic hyperplasia)   . Depression   . 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 ED (erectile dysfunction)   . GERD (gastroesophageal reflux disease)   . H/O calcium pyrophosphate deposition disease (CPPD)   . HLD (hyperlipidemia)   . Hypertension   . Hypogonadism in male   . Lower urinary tract symptoms (LUTS)   . Nocturia   . Stroke [facility](HCC)    Language Impairment.  Left Hemiparesis- improved  . Testosterone overdose   . Vitamin D deficiency     Past Surgical History:  Procedure Laterality Date  . BACK SURGERY    . COLON SURGERY     Colon Resection  . COLONOSCOPY    . IR ANGIO INTRA EXTRACRAN SEL COM CAROTID INNOMINATE UNI L MOD SED  03/07/2017  . IR ANGIO INTRA EXTRACRAN SEL COM CAROTID INNOMINATE UNI R MOD SED  02/16/2017  . IR ANGIO VERTEBRAL SEL SUBCLAVIAN INNOMINATE UNI L MOD SED  03/07/2017  . IR ANGIO VERTEBRAL SEL VERTEBRAL UNI R MOD SED  03/07/2017  . IR INFUSION THROMBOL ARTERIAL INITIAL (MS)  03/07/2017  . IR INTRAVSC STENT CERV CAROTID W/EMB-PROT MOD SED INCL ANGIO  03/07/2017  . IR PERCUTANEOUS ART THROMBECTOMY/INFUSION INTRACRANIAL INC DIAG ANGIO  02/16/2017  . IR PTA NON CORO-LOWER EXTREM  02/16/2017  . IR US GUIDE VASC  ACCESS LEFT  02/16/2017  . IR US GUIDE VASC ACCESS RIGHT  02/16/2017  . KNEE ARTHROSCOPY Bilateral   . RADIOLOGY WITH ANESTHESIA N/A 02/16/2017   Procedure: RADIOLOGY WITH ANESTHESIA;  Surgeon: Julieanne CottonSanjeev Deveshwar, MD;  Location: [facility] OR;  Service: Radiology;  Laterality: N/A;  . RADIOLOGY WITH ANESTHESIA N/A 03/07/2017   Procedure: CAROTID STENT;  Surgeon: Julieanne CottonSanjeev Deveshwar, MD;  Location: [facility] OR;  Service: Radiology;  Laterality: N/A;  . ROTATOR CUFF REPAIR Right     There were no vitals filed for this visit.      Subjective Assessment - 03/14/17 1021    Subjective  Pt. reports he is working his right hand at home.   Patient is accompained by: Family member   Pertinent History Pt. is a 61 y.o. male who had a CVA on the evening of April 12th, 2018. Pt. had been to his grandson's ball game, and was preparing for bed when he felling getting into bed. According to his wife, he struggled to get up. She could tell he was having a stroke, and called EMS. [facility] was administered TPA,. Pt. underwent a cerebral angiogram with Rt ICA stent assisted angioplasty. Pt. [facility].   Patient Stated Goals To regain the  use of his right hand   Currently in Pain? No/denies   Pain Score 0-No pain                      OT Treatments/Exercises (OP) - 03/14/17 1056      Fine Motor Coordination   Other Fine Motor Exercises Pt. performed right FMC tasks using the Grooved pegboard. Pt. worked on grasping the grooved pegs from a horizontal position, and moving the pegs to a vertical position in the hand to prepare for placing them in the grooved slot. Pt. Worked on thumb opposition to his 2nd through 5th digits.      Neurological Re-education Exercises   Other Exercises 1 Pt. worked on reaching with his RUE for cones with a 1.5# weight in place, crossing midline. Pt. Also worked on reaching using the shape tower without the weight in place.  Pt. Was able to move the shapes one vertical dowel length. 3# dumbbell ex. for elbow flexion and extension, forearm supination/pronation, 2# wrist flexion/extension, and radial deviation. Pt. requires rest breaks and verbal cues for proper technique. Pt. performed gross gripping with grip strengthener. Pt. worked on sustaining grip while grasping pegs and reaching at various heights. Gripper was placed in the 3rd resistive slot with the white resistive spring. Pt. Worked on pinch strengthening in the left hand for lateral, and 3pt. pinch using yellow, red, green, and blue resistive clips. Pt. worked on placing the clips at various vertical and horizontal angles. Tactile and verbal cues were required for eliciting the desired movement.                OT Education - 03/14/17 1038    Education provided Yes   Education Details RUE strength, and [facility]   Person(s) Educated Spouse;Patient   Methods Explanation   Comprehension Verbalized understanding             OT Long Term Goals - 03/12/17 1228      OT LONG TERM GOAL #1   Title Pt. will independently, and efficiently write 3 sentences with 100% legibility.   Baseline Increased time required. 90% legibility for only 2 words in large print.   Time 12   Period Weeks   Status New     OT LONG TERM GOAL #2   Title Pt. will independently type a paragraph efficiently.   Baseline Pt. is unable   Time 12   Period Weeks   Status New     OT LONG TERM GOAL #3   Title Pt. improve right grip strength by 5 pounds to be able to hold and use a wrench.   Baseline Pt. is unable   Time 12   Period Weeks   Status New     OT LONG TERM GOAL #4   Title Pt. will improve right lateral pinch strength by 3# to be able to put toothpaste on a toothbrush.   Baseline Pt. is unable to apply toothpaste onto a toothbrush.   Time 12   Period Weeks   Status New     OT LONG TERM GOAL #5   Title Pt. will improve FMC by 10 sec. to be able to independently  handle, and count change.   Baseline Pt. has difficulty   Time 12   Period Weeks   Status New     Long Term Additional Goals   Additional Long Term Goals Yes     OT LONG TERM GOAL #6   Title Pt.  will increase RUE strength by 2mm grades to be able to complete ADLs/IADLs.   Baseline Pt. has difficulty   Time 12   Period Weeks   Status New     OT LONG TERM GOAL #7   Title Pt. will demonstrate independence with visual compensatory strategies for the right 100% of the time during ADL, and IADL tasks.   Baseline Limited on the right. Pt. occ. misses obstacle on the right.   Time 12   Period Weeks   Status New               Plan - 03/14/17 1039    Clinical Impression Statement Pt. reports he is working with weights on his right UE at home. Pt. was able to tolerate reaching with his RUE, and hand with. Pt. continues to compensate proximally during [facility] tasks, hoever presented with less compensation than previous session. Pt. continues to work on isolating distal movements of the hand.   Rehab Potential Good   OT Frequency 2x / week   OT Duration 12 weeks   OT Treatment/Interventions Self-care/ADL training;Therapeutic exercise;Therapeutic exercises;Therapeutic activities;DME and/or AE instruction;Neuromuscular education;Patient/family education;Functional Mobility Training;Cognitive remediation/compensation;Visual/perceptual remediation/compensation;Energy conservation   Consulted and Agree with Plan of Care Patient;Family member/caregiver   Family Member Consulted Wife      Patient will benefit from skilled therapeutic intervention in order to improve the following deficits and impairments:  Decreased coordination, Decreased balance, Impaired UE functional use, Decreased cognition, Impaired vision/preception, Decreased strength, Decreased knowledge of use of DME, Decreased activity tolerance, Impaired tone, Impaired flexibility, Decreased range of motion, Decreased  endurance  Visit Diagnosis: Muscle weakness (generalized)  Other lack of coordination    Problem List Patient Active Problem List   Diagnosis Date Noted  . Carotid artery stenosis with cerebral infarction (HCC) 03/07/2017  . Language impairment   . ICAO (internal carotid artery occlusion), left 02/22/2017  . Acute respiratory failure (HCC) 02/22/2017  . Obesity 02/22/2017  . Expressive aphasia 02/22/2017  . Acute ischemic left MCA stroke (HCC) 02/22/2017  . Left hemiparesis (HCC)   . Status post total bilateral knee replacement   . Benign essential HTN   . Right-sided extracranial carotid artery stenosis   . Dysphagia, post-stroke   . Acute blood loss anemia   . Nocturia   . Newly diagnosed diabetes (HCC)   . CVA (cerebral vascular accident) (HCC) - L MCA s/p tPA and thrombectomy 02/16/2017  . Anxiety 04/30/2015  . Benign fibroma of prostate 04/30/2015  . Clinical depression 04/30/2015  . Acid reflux 04/30/2015  . HLD (hyperlipidemia) 04/30/2015  . BP (high blood pressure) 04/30/2015  . Arthritis, degenerative 04/30/2015  . 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 Arthritis, septic, knee (HCC) 09/26/2012    Olegario Messier, MS, OTR/L 03/14/2017, 10:57 AM  [facility] MAIN [facility] SERVICES [address] [place], [place], [address] Phone: [phone]   Fax:  [phone]  Name: YUKI PURVES MRN: [id number] Date of Birth: [birth year]

## 2017-03-14 NOTE — Therapy (Signed)
[facility] MAIN [facility] SERVICES [address] [place], [place], [address] Phone: [phone]   Fax:  [phone]  Physical Therapy Evaluation  Patient Details  Name: Erik [name] MRN: [id number] Date of Birth: 05/07/1956 Referring Provider: Cruzita Lederer  Encounter Date: 03/14/2017      PT End of Session - 03/14/17 1428    Visit Number 1   Number of Visits 17   Date for PT Re-Evaluation 05/09/17   PT Start Time 1015   PT Stop Time 1115   PT Time Calculation (min) 60 min   Equipment Utilized During Treatment Gait belt   Activity Tolerance Patient tolerated treatment well   Behavior During Therapy [facility] for tasks assessed/performed      Past Medical History:  Diagnosis Date  . Anxiety   . Arthritis    hands  . BPH (benign prostatic hyperplasia)   . Depression   . 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 ED (erectile dysfunction)   . GERD (gastroesophageal reflux disease)   . H/O calcium pyrophosphate deposition disease (CPPD)   . HLD (hyperlipidemia)   . Hypertension   . Hypogonadism in male   . Lower urinary tract symptoms (LUTS)   . Nocturia   . Stroke [name])    Language Impairment.  Left Hemiparesis- improved  . Testosterone overdose   . Vitamin D deficiency     Past Surgical History:  Procedure Laterality Date  . BACK SURGERY    . COLON SURGERY     Colon Resection  . COLONOSCOPY    . IR ANGIO INTRA EXTRACRAN SEL COM CAROTID INNOMINATE UNI L MOD SED  03/07/2017  . IR ANGIO INTRA EXTRACRAN SEL COM CAROTID INNOMINATE UNI R MOD SED  02/16/2017  . IR ANGIO VERTEBRAL SEL SUBCLAVIAN INNOMINATE UNI L MOD SED  03/07/2017  . IR ANGIO VERTEBRAL SEL VERTEBRAL UNI R MOD SED  03/07/2017  . IR INFUSION THROMBOL ARTERIAL INITIAL (MS)  03/07/2017  . IR INTRAVSC STENT CERV CAROTID W/EMB-PROT MOD SED INCL ANGIO  03/07/2017  . IR PERCUTANEOUS ART THROMBECTOMY/INFUSION INTRACRANIAL INC DIAG ANGIO  02/16/2017  . IR PTA NON  CORO-LOWER EXTREM  02/16/2017  . IR US GUIDE VASC ACCESS LEFT  02/16/2017  . IR US GUIDE VASC ACCESS RIGHT  02/16/2017  . KNEE ARTHROSCOPY Bilateral   . RADIOLOGY WITH ANESTHESIA N/A 02/16/2017   Procedure: RADIOLOGY WITH ANESTHESIA;  Surgeon: Julieanne Cotton, MD;  Location: [facility] OR;  Service: Radiology;  Laterality: N/A;  . RADIOLOGY WITH ANESTHESIA N/A 03/07/2017   Procedure: CAROTID STENT;  Surgeon: Julieanne Cotton, MD;  Location: [facility] OR;  Service: Radiology;  Laterality: N/A;  . ROTATOR CUFF REPAIR Right     Vitals:   03/14/17 1018  BP: 112/62  Pulse: 86  SpO2: 100%         Subjective Assessment - 03/14/17 1016    Subjective CVA   Pertinent History Pt. is a 61 y.o. male who had a CVA on the evening of April 12th, 2018. Pt had been to his grandson's ball game that day and watched a movie with his wife. He got in bed and his wife went to take a bath. When she came out pt was on the floor struggling to get back in bed. Pt had become weak while in bed, tried to sit up and fell out of the bed. She could tell he was having a stroke, and called EMS. [facility] to [facility] and CTA head/neck revealed hyperdense L-MCA sign likely indicating acute thrombus. He received tPA and he underwent cerebral angiogram with L-MCA thrombectomy, dense calcified plaque L-ICA precluded stent placement and noted to have 73% stenosis R-ICA due to advanced atherosclerotic changes. Patient with resultant facial weakness with mild dysarthria, aphasia, mild left gaze preference and right sided weakness affecting mobility and ability to carry out ADL tasks. He was in CIR for approximately 8 days per patient and then discharged home. He has been evaluated by outpatient speech and occupational therapy and is now here with wife today for PT evaluation.    Patient Stated Goals Improve his strength and balance in order to improve his function at home and be able to return to work.    Currently in Pain? No/denies             [facility] PT Assessment - 03/14/17 1006      Assessment   Medical Diagnosis CVA   Referring Provider Cruzita Lederer   Onset Date/Surgical Date 02/15/17   Hand Dominance Right   Next MD Visit Pt just saw Dr. Jarrett Ables PA 03/09/17. Next appointment is 04/13/17. Pt will be seeing Dr. Wynn Banker on 03/16/17.    Prior Therapy Pt received therapy at CIR. Pt is also seeing OT and SLP at [facility] for outpatient therapy.     Precautions   Precautions Other (comment)   Precaution Comments No heavy lifting, or strenuous activity per OT note     Restrictions   Weight Bearing Restrictions No     Balance Screen   Has the patient fallen in the past 6 months Yes   How many times? 1  At time of stroke fell off bed, otherwise no falls   Has the patient had a decrease in activity level because of a fear of falling?  Yes   Is the patient reluctant to leave their home because of a fear of falling?  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, working on old cars     Cognition   Overall Cognitive Status Within Functional Limits for tasks assessed   Memory --     Sensation   Additional Comments Pt reports some intermittent tingling in R calf and RUE. [name] that it is more frequent in the mornings and      ROM / Strength   AROM / PROM / Strength Strength     AROM   Overall AROM Comments RUE:  shoulder flexion: 135 degrees, abduction 115 degrees. LUE: WFL. Measurements taken in sitting     Strength   Overall Strength Comments RUE: shoulder flexion 4-/5, abduction 3+/5, elbow flexion/extension 5/5, forearm supination/pronation, and writs extension is 5/5.   Strength Assessment Site Hip;Knee;Ankle   Right/Left Hip Right;Left   Right Hip Flexion 4/5   Right Hip Extension 4+/5   Right Hip External Rotation  4/5   Right Hip Internal Rotation 4/5   Right Hip ABduction 4/5   Right Hip ADduction 3+/5   Left Hip Flexion 4+/5   Left Hip Extension 4+/5   Left Hip External Rotation 4/5   Left Hip Internal Rotation 4/5   Left Hip ABduction 4+/5   Left Hip ADduction 5/5   Right/Left Knee Right;Left   Right Knee Flexion 5/5   Right Knee Extension 5/5   Left Knee Flexion 5/5   Left Knee Extension 5/5   Right/Left Ankle Right;Left   Right Ankle Dorsiflexion 4-/5   Right Ankle Plantar Flexion --  Unable to perform full heel raise in standing   Left Ankle Dorsiflexion 5/5   Left Ankle Plantar Flexion --  Able to perform >10 heel raises in standing     Palpation   Palpation comment Deferred     Transfers   Comments Able to perform without UE support     Ambulation/Gait   Gait Comments Mild RLE ER during ambulation. Decreased R toe clearance with intermittent catching of R toe and increased foot slap. Significantly decreased R arm swing      Standardized Balance Assessment   Standardized Balance Assessment Berg Balance Test;Dynamic Gait Index;Timed Up and Go Test;Five Times Sit to Stand;10 meter walk test   Five times sit to stand comments  11.3   10 Meter Walk 9.25s = 1.08 m/s     Berg Balance Test   Sit to Stand Able to stand without using hands and stabilize independently   Standing Unsupported Able to stand safely 2 minutes   Sitting with Back Unsupported but Feet Supported on Floor or Stool Able to sit safely and securely 2 minutes   Stand to Sit Sits safely with minimal  use of hands   Transfers Able to transfer safely, minor use of hands   Standing Unsupported with Eyes Closed Able to stand 10 seconds safely   Standing Ubsupported with Feet Together Able to place feet together independently and stand 1 minute safely   From Standing, Reach Forward with Outstretched Arm Can reach confidently >25 cm (10")   From Standing Position, Pick up Object from Floor Able to pick up shoe safely and easily   From Standing Position, Turn to Look Behind Over each Shoulder Looks behind from both sides and weight shifts well   Turn 360 Degrees Able to turn 360 degrees safely in 4 seconds or less   Standing Unsupported, Alternately Place Feet on Step/Stool Able to complete >2 steps/needs minimal assist   Standing Unsupported, One Foot in Front Able to plae foot ahead of the other independently and hold 30 seconds   Standing on One Leg Tries to lift leg/unable to hold 3 seconds but remains standing independently  on RLE, >10s LLE   Total Score 49     Dynamic Gait Index   Level Surface Mild Impairment   Change in Gait Speed Normal   Gait with Horizontal Head Turns Mild Impairment   Gait with Vertical Head Turns Normal   Gait and Pivot Turn Normal   Step Over Obstacle Mild Impairment   Step Around Obstacles Severe Impairment   Steps Mild Impairment   Total Score 17     Timed Up and Go Test   TUG Normal TUG   Normal TUG (seconds) 11.4   Cognitive TUG (seconds) 11        TREATMENT  Neuromuscular Re-education Seated R ankle RTB resisted DF, 3s hold x 10; Standing RTB  resisted R hip flexion, abduction, and extension x 5 each; Standing mini squats x 10; Standing toe taps/single leg balance; Tandem balance x 30s; Written HEP provided and explained to patient and wife;                     PT Education - 03/14/17 1428    Education provided Yes   Education Details Plan of care, HEP   Person(s) Educated Patient;Spouse   Methods  Explanation;Demonstration;Verbal cues;Handout   Comprehension Verbalized understanding;Returned demonstration;Verbal cues required             PT Long Term Goals - 03/14/17 1513      PT LONG TERM GOAL #1   Title Pt will be independent with HEP in order to improve strength and balance in order to decrease fall risk and improve function at home and work.    Time 8   Period Weeks   Status New     PT LONG TERM GOAL #2   Title Pt will improve BERG by at least 3 points in order to demonstrate clinically significant improvement in balance.   Baseline 03/14/17: 49/56   Time 8   Period Weeks   Status New     PT LONG TERM GOAL #3   Title Pt will improve DGI by at least 3 points in order to demonstrate clinically significant improvement in balance and decreased risk for falls    Baseline 03/14/17: 17/24   Time 8   Period Weeks   Status New     PT LONG TERM GOAL #4   Title Pt will improve R hip abduction, adduction, and flexion strength by at least 1/2 MMT in order to improve gait    Baseline 03/14/17: see flowsheet   Time 8   Status New               Plan - 03/14/17 1429    Clinical Impression Statement Pt. is a 61 y.o. male referred for CVA 02/15/17. [facility] and also [facility]. He recently underwent R ICA stent assisted angioplasty 03/07/17. OT and SLP are currently treating [facility] as well. PT evaluation reveals decreased balance with patient scoring 49/56 on BERG and 17/24 on DGI. Single leg balance is 1-2 seconds on RLE. He presents with weakness in R hip flexion, abduction, and adduction as well as weakness in R ankle dorsiflexion and plantarflexion. Decreased R arm swing during gait and intermittent R foot drop during swing phase as well as foot slap with loading.  Pt will benefit from skilled PT services to address deficits in strength in balance in order to improve function at home/work and progress  toward patient's goal of returning to work.    Rehab Potential Good   Clinical Impairments Affecting Rehab Potential Positive: age, motivation, social support; Negative: significant ICA stenosis   PT Frequency 2x / week   PT Duration 8 weeks   PT Treatment/Interventions ADLs/Self Care Home Management;Aquatic Therapy;Biofeedback;Cryotherapy;Electrical Stimulation;Iontophoresis 4mg /ml Dexamethasone;Moist Heat;Ultrasound;DME Instruction;Gait training;Stair training;Functional mobility training;Therapeutic activities;Therapeutic exercise;Balance training;Neuromuscular re-education;Patient/family education;Manual techniques;Passive range of motion;Energy conservation   PT Next Visit Plan Progress balance and strengthening   PT Home Exercise Plan Hip 3 ways with RTB, mini squats, RTB resisted R ankle DF, toe taps, semitandem/tandem balance;   Consulted and Agree with Plan of Care Patient      Patient will benefit from skilled therapeutic intervention in order to improve the following  deficits and impairments:  Abnormal gait, Decreased balance, Decreased coordination, Decreased mobility, Decreased strength, Difficulty walking  Visit Diagnosis: Hemiplegia and hemiparesis following cerebral infarction affecting right dominant side (HCC) - Plan: PT plan of care cert/re-cert  Muscle weakness (generalized) - Plan: PT plan of care cert/re-cert  Unsteadiness on feet - Plan: PT plan of care cert/re-cert     Problem List Patient Active Problem List   Diagnosis Date Noted  . Carotid artery stenosis with cerebral infarction (HCC) 03/07/2017  . Language impairment   . ICAO (internal carotid artery occlusion), left 02/22/2017  . Acute respiratory failure (HCC) 02/22/2017  . Obesity 02/22/2017  . Expressive aphasia 02/22/2017  . Acute ischemic left MCA stroke (HCC) 02/22/2017  . Left hemiparesis (HCC)   . Status post total bilateral knee replacement   . Benign essential HTN   . Right-sided  extracranial carotid artery stenosis   . Dysphagia, post-stroke   . Acute blood loss anemia   . Nocturia   . Newly diagnosed diabetes (HCC)   . CVA (cerebral vascular accident) (HCC) - L MCA s/p tPA and thrombectomy 02/16/2017  . Anxiety 04/30/2015  . Benign fibroma of prostate 04/30/2015  . Clinical depression 04/30/2015  . Acid reflux 04/30/2015  . HLD (hyperlipidemia) 04/30/2015  . BP (high blood pressure) 04/30/2015  . Arthritis, degenerative 04/30/2015  . 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 Arthritis, septic, knee (HCC) 09/26/2012   Lynnea Maizes PT, DPT   Jaleisa Brose 03/14/2017, 3:54 PM  [facility] MAIN [facility] SERVICES [address] [place], [place], [address] Phone: [phone]   Fax:  [phone]  Name: Erik [name] MRN: [id number] Date of Birth: 1956/08/17

## 2017-03-14 NOTE — Patient Instructions (Signed)
OT TREATMENT     Neuro muscular re-education:  Pt. performed right [facility] tasks using the Grooved pegboard. Pt. worked on grasping the grooved pegs from a horizontal position, and moving the pegs to a vertical position in the hand to prepare for placing them in the grooved slot. Pt. Worked on thumb opposition to his 2nd through 5th digits.   Therapeutic Exercise:  Pt. worked on reaching with his RUE for cones with a 1.5# weight in place, crossing midline. Pt. Also worked on reaching using the shape tower without the weight in place. Pt. Was able to move the shapes one vertical dowel length. 3# dumbbell ex. for elbow flexion and extension, forearm supination/pronation, 2# wrist flexion/extension, and radial deviation. Pt. requires rest breaks and verbal cues for proper technique. Pt. performed gross gripping with grip strengthener. Pt. worked on sustaining grip while grasping pegs and reaching at various heights. Gripper was placed in the 3rd resistive slot with the white resistive spring. Pt. Worked on pinch strengthening in the left hand for lateral, and 3pt. pinch using yellow, red, green, and blue resistive clips. Pt. worked on placing the clips at various vertical and horizontal angles. Tactile and verbal cues were required for eliciting the desired movement.  Selfcare:  Manual Therapy:

## 2017-03-16 ENCOUNTER — Encounter: Payer: Self-pay | Admitting: Physical Medicine & Rehabilitation

## 2017-03-16 ENCOUNTER — Ambulatory Visit (HOSPITAL_BASED_OUTPATIENT_CLINIC_OR_DEPARTMENT_OTHER): Payer: Managed Care, Other (non HMO) | Admitting: Physical Medicine & Rehabilitation

## 2017-03-16 ENCOUNTER — Encounter: Payer: Managed Care, Other (non HMO) | Attending: Physical Medicine & Rehabilitation

## 2017-03-16 VITALS — BP 105/67 | HR 73

## 2017-03-16 DIAGNOSIS — R269 Unspecified abnormalities of gait and mobility: Secondary | ICD-10-CM | POA: Diagnosis not present

## 2017-03-16 DIAGNOSIS — I69920 Aphasia following unspecified cerebrovascular disease: Secondary | ICD-10-CM

## 2017-03-16 DIAGNOSIS — E119 Type 2 diabetes mellitus without complications: Secondary | ICD-10-CM | POA: Insufficient documentation

## 2017-03-16 DIAGNOSIS — E785 Hyperlipidemia, unspecified: Secondary | ICD-10-CM | POA: Insufficient documentation

## 2017-03-16 DIAGNOSIS — I69322 Dysarthria following cerebral infarction: Secondary | ICD-10-CM | POA: Diagnosis not present

## 2017-03-16 DIAGNOSIS — I69320 Aphasia following cerebral infarction: Secondary | ICD-10-CM | POA: Diagnosis present

## 2017-03-16 DIAGNOSIS — I69351 Hemiplegia and hemiparesis following cerebral infarction affecting right dominant side: Secondary | ICD-10-CM | POA: Insufficient documentation

## 2017-03-16 DIAGNOSIS — I10 Essential (primary) hypertension: Secondary | ICD-10-CM | POA: Insufficient documentation

## 2017-03-16 DIAGNOSIS — F419 Anxiety disorder, unspecified: Secondary | ICD-10-CM | POA: Diagnosis not present

## 2017-03-16 DIAGNOSIS — I69398 Other sequelae of cerebral infarction: Secondary | ICD-10-CM | POA: Diagnosis not present

## 2017-03-16 NOTE — Patient Instructions (Addendum)
No driving  No return to work until Patent attorneyre evaluation

## 2017-03-16 NOTE — Progress Notes (Signed)
Subjective:  Transitional care visit after rehabilitation hospitalization  Patient ID: Erik [name], male    DOB: [birth year], 61 y.o.   MRN: [id number] 61 y.o. male with history of BPH, OA s/p B-TKR , HTN (wife/patient deny) who was admitted on 02/16/17 after being found by wife lying on the ground with inability to speak. CTA head/neck revealed hyperdense L-MCA sign likely indicating acute thrombus. He received tPA  and he underwent cerebral angiogram with L-MCA thrombectomy, dense calcified plaque L-ICA precluded stent placement and noted to have 73% stenosis R-ICA due to advanced atherosclerotic changes. MRI/MRA brain reviewed, showing multiple areas of infarct. Carotid dopplers with right severe mixed plaque origin and proximal ICA/ECA 80-99%. He was started on DAPT for thromboembolic stroke due to large vessel disease and to plan for elective surgery post-rehab.  Patient with resultant facial weakness with mild dysarthria, aphasia, mild left gaze preference and right sided weakness affecting mobility and ability to carry out ADL tasks Admit date: 02/22/2017 Discharge date: 03/02/2017  HPI Patient underwent stenting of the right internal carotid artery on 03/07/2017.  No postoperative complications. Was taken off Plavix due to P2 Y 12. Platelet test, elevated at 266, indicating resistance to Plavix. Patient was placed on Brilinta 90mg  BID  Was getting dizziness when standing , seen by primary care, PA Amlodipine dose was reduced to 5 mg per day  Patient has not worked since his stroke. He still has problems with speech, as well as with his balance. He has not been driving. His right upper extremity remains weak and uncoordinated Pain Inventory Average Pain 0 Pain Right Now 0 My pain is na  In the last 24 hours, has pain interfered with the following? General activity 0 Relation with others 0 Enjoyment of life 0 What TIME of day is your pain at its worst? na Sleep (in general) na  Pain  is worse with: na Pain improves with: na Relief from Meds: na  Mobility walk without assistance ability to climb steps?  yes do you drive?  no  Function Do you have any goals in this area?  yes  Neuro/Psych weakness anxiety  Prior Studies Any changes since last visit?  no  Physicians involved in your care Any changes since last visit?  no   Family History  Problem Relation Age of Onset  . Diabetes Mother   . Hypertension Mother   . Dementia Mother   . Malig Hypertension Mother   . Hyperlipidemia Mother   . Heart Problems Mother   . Heart Problems Brother    Social History   Social History  . Marital status: Married    Spouse name: N/A  . Number of children: N/A  . Years of education: N/A   Social History Main Topics  . Smoking status: Never Smoker  . Smokeless tobacco: Never Used  . Alcohol use 4.8 oz/week    6 Shots of liquor, 2 Glasses of wine per week     Comment: drinks every day vodka with water  . Drug use: No  . Sexual activity: Yes   Other Topics Concern  . Not on file   Social History Narrative  . No narrative on file   Past Surgical History:  Procedure Laterality Date  . BACK SURGERY    . COLON SURGERY     Colon Resection  . COLONOSCOPY    . IR ANGIO INTRA EXTRACRAN SEL COM CAROTID INNOMINATE UNI L MOD SED  03/07/2017  . IR ANGIO INTRA EXTRACRAN  SEL COM CAROTID INNOMINATE UNI R MOD SED  02/16/2017  . IR ANGIO VERTEBRAL SEL SUBCLAVIAN INNOMINATE UNI L MOD SED  03/07/2017  . IR ANGIO VERTEBRAL SEL VERTEBRAL UNI R MOD SED  03/07/2017  . IR INFUSION THROMBOL ARTERIAL INITIAL (MS)  03/07/2017  . IR INTRAVSC STENT CERV CAROTID W/EMB-PROT MOD SED INCL ANGIO  03/07/2017  . IR PERCUTANEOUS ART THROMBECTOMY/INFUSION INTRACRANIAL INC DIAG ANGIO  02/16/2017  . IR PTA NON CORO-LOWER EXTREM  02/16/2017  . IR US GUIDE VASC ACCESS LEFT  02/16/2017  . IR US GUIDE VASC ACCESS RIGHT  02/16/2017  . KNEE ARTHROSCOPY Bilateral   . RADIOLOGY WITH ANESTHESIA N/A 02/16/2017     Procedure: RADIOLOGY WITH ANESTHESIA;  Surgeon: Julieanne Cotton, MD;  Location: [facility] OR;  Service: Radiology;  Laterality: N/A;  . RADIOLOGY WITH ANESTHESIA N/A 03/07/2017   Procedure: CAROTID STENT;  Surgeon: Julieanne Cotton, MD;  Location: [facility] OR;  Service: Radiology;  Laterality: N/A;  . ROTATOR CUFF REPAIR Right    Past Medical History:  Diagnosis Date  . Anxiety   . Arthritis    hands  . BPH (benign prostatic hyperplasia)   . Depression   . 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 ED (erectile dysfunction)   . GERD (gastroesophageal reflux disease)   . H/O calcium pyrophosphate deposition disease (CPPD)   . HLD (hyperlipidemia)   . Hypertension   . Hypogonadism in male   . Lower urinary tract symptoms (LUTS)   . Nocturia   . Stroke [facility])    Language Impairment.  Left Hemiparesis- improved  . Testosterone overdose   . Vitamin D deficiency    There were no vitals taken for this visit.  Opioid Risk Score:   Fall Risk Score:  `1  Depression screen PHQ 2/9  No flowsheet data found.  Review of Systems  Constitutional: Negative.   HENT: Negative.   Eyes: Negative.   Respiratory: Negative.   Cardiovascular: Negative.   Gastrointestinal: Negative.   Endocrine: Negative.   Genitourinary: Negative.   Musculoskeletal: Negative.   Skin: Negative.   Allergic/Immunologic: Negative.   Neurological: Negative.   Hematological: Negative.   Psychiatric/Behavioral: Negative.   All other systems reviewed and are negative.      Objective:   Physical Exam  Constitutional: He is oriented to person, place, and time. He appears well-developed and well-nourished.  HENT:  Head: Normocephalic and atraumatic.  Eyes: Conjunctivae and EOM are normal. Pupils are equal, round, and reactive to light.  Neck: Normal range of motion. Neck supple.  Cardiovascular: Normal rate and regular rhythm.   Pulmonary/Chest: Effort normal and breath sounds normal. No  respiratory distress. He has no wheezes.  Abdominal: Soft. Bowel sounds are normal. He exhibits no distension. There is no tenderness.  Neurological: He is alert and oriented to person, place, and time.  Psychiatric: Thought content normal. His affect is blunt. His speech is delayed. He is slowed.  Nursing note and vitals reviewed.  motor strength is 4/5 in the right deltoid, bicep, triceps, grip, hip flexor, knee extensor, ankle dorsiflexor 5/5 on the left side. Patient with dysdiadochokinesis with rapid alternating supination and pronation of the right upper extremity. Decreased finger thumb opposition. Right upper extremity. Speech is dysarthric, word finding difficulties  Gait is wide-based. No evidence of toe drag or knee instability, unable to do tandem gait.  Visual fields intact to confrontation testing      Assessment & Plan:  1. 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 Continue outpatient speech therapy, occupational therapy and physical therapy  2. 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 We discussed that this is more common with right parietal strokes, rather than left parietal strokes. He should not be driving until this clears and until his right upper extremity strength improves  Patient is unable to work because of his stroke related deficits. Reassessment in 6 weeks.

## 2017-03-19 NOTE — Telephone Encounter (Signed)
Spoke with patients wife advised Dr. Pearlean BrownieSethi will be out of the office until June 4th, wife states form needs to be completed by May 24th.  Advised her again he will be out of the office until June 4th and to see if patients rehab Dr. Is able to complete the form now due to Dr. Pearlean BrownieSethi being out of the office after date form is needing to be completed by. She voiced understanding and will be contacting Dr. Windell MomentSusan Abernathy rehab Dr.

## 2017-03-19 NOTE — Telephone Encounter (Signed)
Dr. Pearlean BrownieSethi is not here. Pt was left vm that paper work can be fill out by hospital MD. DrMarland Kitchen.. Pearlean BrownieSethi is out of the office for the next week.

## 2017-03-19 NOTE — Telephone Encounter (Signed)
Patients wife Toniann FailWendy called office in reference to [employer] forms to be sure our office received the paperwork to be completed/updated.  Please call

## 2017-03-19 NOTE — Telephone Encounter (Signed)
Dr.Sethi returns to the office on 04/09/2017, it can be fill out on that week. They will need to refax form to [phone].

## 2017-03-20 ENCOUNTER — Telehealth (HOSPITAL_COMMUNITY): Payer: Self-pay

## 2017-03-20 ENCOUNTER — Encounter: Payer: Self-pay | Admitting: Physical Therapy

## 2017-03-20 ENCOUNTER — Encounter: Payer: Self-pay | Admitting: Speech Pathology

## 2017-03-20 ENCOUNTER — Ambulatory Visit: Payer: Managed Care, Other (non HMO) | Admitting: Speech Pathology

## 2017-03-20 ENCOUNTER — Ambulatory Visit: Payer: Managed Care, Other (non HMO) | Admitting: Physical Therapy

## 2017-03-20 VITALS — BP 142/68

## 2017-03-20 DIAGNOSIS — M6281 Muscle weakness (generalized): Secondary | ICD-10-CM

## 2017-03-20 DIAGNOSIS — I69351 Hemiplegia and hemiparesis following cerebral infarction affecting right dominant side: Secondary | ICD-10-CM

## 2017-03-20 DIAGNOSIS — R471 Dysarthria and anarthria: Secondary | ICD-10-CM

## 2017-03-20 DIAGNOSIS — R2681 Unsteadiness on feet: Secondary | ICD-10-CM

## 2017-03-20 DIAGNOSIS — R4701 Aphasia: Secondary | ICD-10-CM

## 2017-03-20 NOTE — Telephone Encounter (Signed)
Called to schedule f/u, left message for pt to return call. AW 

## 2017-03-20 NOTE — Therapy (Signed)
[place] [facility] MAIN [facility]REHAB SERVICES [address] [place], [place], [address] Phone: [phone]   Fax:  [phone]  Speech Language Pathology Treatment  Patient Details  Name: Erik [name] MRN: [id number] Date of Birth: January 09, 1956 Referring Provider: Charlton AmorANGIULLI, DANIEL J   Encounter Date: 03/20/2017      End of Session - 03/20/17 1714    Visit Number 2   Number of Visits 25   Date for SLP Re-Evaluation 06/12/17   SLP Start Time 1600   SLP Stop Time  1700   SLP Time Calculation (min) 60 min   Activity Tolerance Patient tolerated treatment well      Past Medical History:  Diagnosis Date  . Anxiety   . Arthritis    hands  . BPH (benign prostatic hyperplasia)   . Depression   . 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 ED (erectile dysfunction)   . GERD (gastroesophageal reflux disease)   . H/O calcium pyrophosphate deposition disease (CPPD)   . HLD (hyperlipidemia)   . Hypertension   . Hypogonadism in male   . Lower urinary tract symptoms (LUTS)   . Nocturia   . Stroke [facility](HCC)    Language Impairment.  Left Hemiparesis- improved  . Testosterone overdose   . Vitamin D deficiency     Past Surgical History:  Procedure Laterality Date  . BACK SURGERY    . COLON SURGERY     Colon Resection  . COLONOSCOPY    . IR ANGIO INTRA EXTRACRAN SEL COM CAROTID INNOMINATE UNI L MOD SED  03/07/2017  . IR ANGIO INTRA EXTRACRAN SEL COM CAROTID INNOMINATE UNI R MOD SED  02/16/2017  . IR ANGIO VERTEBRAL SEL SUBCLAVIAN INNOMINATE UNI L MOD SED  03/07/2017  . IR ANGIO VERTEBRAL SEL VERTEBRAL UNI R MOD SED  03/07/2017  . IR INFUSION THROMBOL ARTERIAL INITIAL (MS)  03/07/2017  . IR INTRAVSC STENT CERV CAROTID W/EMB-PROT MOD SED INCL ANGIO  03/07/2017  . IR PERCUTANEOUS ART THROMBECTOMY/INFUSION INTRACRANIAL INC DIAG ANGIO  02/16/2017  . IR PTA NON CORO-LOWER EXTREM  02/16/2017  . IR US GUIDE VASC ACCESS LEFT  02/16/2017  . IR US GUIDE VASC  ACCESS RIGHT  02/16/2017  . KNEE ARTHROSCOPY Bilateral   . RADIOLOGY WITH ANESTHESIA N/A 02/16/2017   Procedure: RADIOLOGY WITH ANESTHESIA;  Surgeon: Julieanne CottonSanjeev Deveshwar, MD;  Location: [facility] OR;  Service: Radiology;  Laterality: N/A;  . RADIOLOGY WITH ANESTHESIA N/A 03/07/2017   Procedure: CAROTID STENT;  Surgeon: Julieanne CottonSanjeev Deveshwar, MD;  Location: [facility] OR;  Service: Radiology;  Laterality: N/A;  . ROTATOR CUFF REPAIR Right     There were no vitals filed for this visit.      Subjective Assessment - 03/20/17 1713    Subjective "I've always been a fast talker"   Currently in Pain? No/denies               ADULT SLP TREATMENT - 03/20/17 0001      General Information   Behavior/Cognition Alert;Cooperative;Pleasant mood   HPI 61 year old man with left MCA CVA 02/15/2017.  Patient received inpatient rehab services, including SLP, 02/22/2017-03/02/2017.     Treatment Provided   Treatment provided Cognitive-Linquistic     Pain Assessment   Pain Assessment No/denies pain     Cognitive-Linquistic Treatment   Treatment focused on Aphasia;Dysarthria   Skilled Treatment SPEECH: Imitate multi-syllabic words with 60% accuracy.  Produce s/sh minimal pairs with 75% accuracy.  Read complex  sentences aloud with max cues to accurately read and produce words.  Generate simple sentences given simple picture stimulus with 75% accuracy using slowed rate.  LANGUAGE: Name concrete categories given 3 members with 85% accuracy.  Generate cogent/grammatical sentences given simple picture stimulus with 75% accuracy given cues to say each word.     Assessment / Recommendations / Plan   Plan Continue with current plan of care     Progression Toward Goals   Progression toward goals Progressing toward goals          SLP Education - 03/20/17 1713    Education provided Yes   Education Details Decode letters to read accrurately   Person(s) Educated Patient   Methods Explanation   Comprehension Verbalized  understanding            SLP Long Term Goals - 03/13/17 1425      SLP LONG TERM GOAL #1   Title Pt will improve speech intelligibility in response to moderately complex cognitive linguistic tasks by controlling rate of speech, over-articulation, and increased loudness to achieve 80% intelligibility.   Time 12   Period Weeks   Status New     SLP LONG TERM GOAL #2   Title Patient will read aloud sentences and multi-syllabic words, maintaining phonemic accuracy, with 80% accuracy.     Time 12   Period Weeks   Status New     SLP LONG TERM GOAL #3   Title Patient will demonstrate reading comprehension for paragraphs with 80% accuracy.    Time 12   Period Weeks   Status New     SLP LONG TERM GOAL #4   Title Patient will complete high level word finding tasks with 80% accuracy.   Time 12   Period Weeks   Status New          Plan - 03/20/17 1715    Clinical Impression Statement The patient is able to improve speech intelligibility by slowing the rate.  The patient is having significant difficulty with decoding written language.  Will continue to focus on speech/language needs using reading as primary tool.   Speech Therapy Frequency 2x / week   Duration Other (comment)   Treatment/Interventions Patient/family education;Compensatory strategies;SLP instruction and feedback;Functional tasks;Cueing hierarchy   Potential to Achieve Goals Good   Potential Considerations Ability to learn/carryover information;Co-morbidities;Cooperation/participation level;Medical prognosis;Previous level of function;Severity of impairments;Family/community support   SLP Home Exercise Plan s/sh contrast, simple reading   Consulted and Agree with Plan of Care Patient      Patient will benefit from skilled therapeutic intervention in order to improve the following deficits and impairments:   Aphasia  Dysarthria and anarthria    Problem List Patient Active Problem List   Diagnosis Date Noted   . Hemiparesis affecting right side as late effect of cerebrovascular accident (CVA) (HCC) 03/16/2017  . Aphasia, late effect of cerebrovascular disease 03/16/2017  . Gait disturbance, post-stroke 03/16/2017  . Dysarthria as late effect of stroke 03/16/2017  . Carotid artery stenosis with cerebral infarction (HCC) 03/07/2017  . Language impairment   . ICAO (internal carotid artery occlusion), left 02/22/2017  . Acute respiratory failure (HCC) 02/22/2017  . Obesity 02/22/2017  . Expressive aphasia 02/22/2017  . Acute ischemic left MCA stroke (HCC) 02/22/2017  . Left hemiparesis (HCC)   . Status post total bilateral knee replacement   . Benign essential HTN   . Right-sided extracranial carotid artery stenosis   . Dysphagia, post-stroke   . Acute blood  loss anemia   . Nocturia   . Newly diagnosed diabetes (HCC)   . CVA (cerebral vascular accident) (HCC) - L MCA s/p tPA and thrombectomy 02/16/2017  . Anxiety 04/30/2015  . Benign fibroma of prostate 04/30/2015  . Clinical depression 04/30/2015  . Acid reflux 04/30/2015  . HLD (hyperlipidemia) 04/30/2015  . BP (high blood pressure) 04/30/2015  . Arthritis, degenerative 04/30/2015  . 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 Arthritis, septic, knee (HCC) 09/26/2012   Dollene Primrose, MS/CCC- SLP  Leandrew Koyanagi 03/20/2017, 5:16 PM  [place] [facility] MAIN [facility] SERVICES [address] [place], [place], [address] Phone: [phone]   Fax:  [phone]   Name: Erik [name] MRN: [id number] Date of Birth: [birth year]

## 2017-03-20 NOTE — Therapy (Signed)
[facility] MAIN [facility] SERVICES [address] [place], [place], [address] Phone: [phone]   Fax:  [phone]  Physical Therapy Treatment  Patient Details  Name: Erik [name] MRN: [id number] Date of Birth: 1955/11/21 Referring Provider: Cruzita Lederer  Encounter Date: 03/20/2017      PT End of Session - 03/20/17 1527    Visit Number 2   Number of Visits 17   Date for PT Re-Evaluation 05/09/17   PT Start Time 1518   PT Stop Time 1557   PT Time Calculation (min) 39 min   Equipment Utilized During Treatment Gait belt   Activity Tolerance Patient tolerated treatment well   Behavior During Therapy [facility] for tasks assessed/performed      Past Medical History:  Diagnosis Date  . Anxiety   . Arthritis    hands  . BPH (benign prostatic hyperplasia)   . Depression   . 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 ED (erectile dysfunction)   . GERD (gastroesophageal reflux disease)   . H/O calcium pyrophosphate deposition disease (CPPD)   . HLD (hyperlipidemia)   . Hypertension   . Hypogonadism in male   . Lower urinary tract symptoms (LUTS)   . Nocturia   . Stroke [facility])    Language Impairment.  Left Hemiparesis- improved  . Testosterone overdose   . Vitamin D deficiency     Past Surgical History:  Procedure Laterality Date  . BACK SURGERY    . COLON SURGERY     Colon Resection  . COLONOSCOPY    . IR ANGIO INTRA EXTRACRAN SEL COM CAROTID INNOMINATE UNI L MOD SED  03/07/2017  . IR ANGIO INTRA EXTRACRAN SEL COM CAROTID INNOMINATE UNI R MOD SED  02/16/2017  . IR ANGIO VERTEBRAL SEL SUBCLAVIAN INNOMINATE UNI L MOD SED  03/07/2017  . IR ANGIO VERTEBRAL SEL VERTEBRAL UNI R MOD SED  03/07/2017  . IR INFUSION THROMBOL ARTERIAL INITIAL (MS)  03/07/2017  . IR INTRAVSC STENT CERV CAROTID W/EMB-PROT MOD SED INCL ANGIO  03/07/2017  . IR PERCUTANEOUS ART THROMBECTOMY/INFUSION INTRACRANIAL INC DIAG ANGIO  02/16/2017  . IR PTA NON  CORO-LOWER EXTREM  02/16/2017  . IR US GUIDE VASC ACCESS LEFT  02/16/2017  . IR US GUIDE VASC ACCESS RIGHT  02/16/2017  . KNEE ARTHROSCOPY Bilateral   . RADIOLOGY WITH ANESTHESIA N/A 02/16/2017   Procedure: RADIOLOGY WITH ANESTHESIA;  Surgeon: Julieanne Cotton, MD;  Location: [facility] OR;  Service: Radiology;  Laterality: N/A;  . RADIOLOGY WITH ANESTHESIA N/A 03/07/2017   Procedure: CAROTID STENT;  Surgeon: Julieanne Cotton, MD;  Location: [facility] OR;  Service: Radiology;  Laterality: N/A;  . ROTATOR CUFF REPAIR Right     Vitals:   03/20/17 1526  BP: (!) 142/68        Subjective Assessment - 03/20/17 1525    Subjective Pt reports he is doing well today.  No complaints or concerns. Pt reports he is completing his HEP 3x/day.   Pertinent History Pt. is a 61 y.o. male who had a CVA on the evening of April 12th, 2018. Pt had been to his grandson's ball game that day and watched a movie with his wife. He got in bed and his wife went to take a bath. When she came out pt was on the floor struggling to get back in bed. Pt had become weak while in bed, tried to sit up and fell out of the bed. She could tell  he was having a stroke, and called EMS. Pt was [facility] to [facility] and CTA head/neck revealed hyperdense L-MCA sign likely indicating acute thrombus. He received tPA and he underwent cerebral angiogram with L-MCA thrombectomy, dense calcified plaque L-ICA precluded stent placement and noted to have 73% stenosis R-ICA due to advanced atherosclerotic changes. Patient with resultant facial weakness with mild dysarthria, aphasia, mild left gaze preference and right sided weakness affecting mobility and ability to carry out ADL tasks. He was in CIR for approximately 8 days per patient and then discharged home. He has been evaluated by outpatient speech and occupational therapy and is now here with wife today for PT evaluation.    Patient Stated Goals Improve his strength and balance in order to improve his  function at home and be able to return to work.    Currently in Pain? No/denies       Therapeutic Exercise:  Seated R DF with GTB 3x15  Standing marching with RTB resistance x10 each LE  Alternating toe taps up to 8" step x 20 each LE  Stepping from airex to another airex leading with LLE x10 and then RLE x10. Intermittent support from // bars.  Squat to chair as cue 2x20 with cues to bring bottom back when squatting to improve glute recruitment. Mirror feedback and cues for equal distribution of weight between BLEs as pt has tendency to favor LLE.  BLE leg press #135 x15  RLE leg press 75# x15, 90# x15 with fatigue reported and noted at end of second set.    Neuromuscular Re-ed:  Marching on airex with intermittent support from // bars. X10 each LE.  R SLS 2x30 seconds with intermittent support from // bars.  Tandem walking on airex x6 lengths in // bars with intermittent UE support         PT Education - 03/20/17 1527    Education provided Yes   Education Details Exercise technique; clinical reasoning behind interventions   Person(s) Educated Patient   Methods Explanation;Demonstration;Verbal cues   Comprehension Verbalized understanding;Returned demonstration;Verbal cues required;Need further instruction             PT Long Term Goals - 03/14/17 1513      PT LONG TERM GOAL #1   Title Pt will be independent with HEP in order to improve strength and balance in order to decrease fall risk and improve function at home and work.    Time 8   Period Weeks   Status New     PT LONG TERM GOAL #2   Title Pt will improve BERG by at least 3 points in order to demonstrate clinically significant improvement in balance.   Baseline 03/14/17: 49/56   Time 8   Period Weeks   Status New     PT LONG TERM GOAL #3   Title Pt will improve DGI by at least 3 points in order to demonstrate clinically significant improvement in balance and decreased risk for falls    Baseline 03/14/17:  17/24   Time 8   Period Weeks   Status New     PT LONG TERM GOAL #4   Title Pt will improve R hip abduction, adduction, and flexion strength by at least 1/2 MMT in order to improve gait    Baseline 03/14/17: see flowsheet   Time 8   Status New               Plan - 03/20/17 1550    Clinical Impression  Statement Pt presents with flat affect but pleasant.  He demonstrates weakness through RLE, most noticeably with R DF.  Targeted interventions on strengthening RLE and balance exercises. Pt requires UE support for balance exercises involving RLE SLS.  He will continue to benefit from continued skilled PT interventions for improved balance, strength, and QOL.   Rehab Potential Good   Clinical Impairments Affecting Rehab Potential Positive: age, motivation, social support; Negative: significant ICA stenosis   PT Frequency 2x / week   PT Duration 8 weeks   PT Treatment/Interventions ADLs/Self Care Home Management;Aquatic Therapy;Biofeedback;Cryotherapy;Electrical Stimulation;Iontophoresis 4mg /ml Dexamethasone;Moist Heat;Ultrasound;DME Instruction;Gait training;Stair training;Functional mobility training;Therapeutic activities;Therapeutic exercise;Balance training;Neuromuscular re-education;Patient/family education;Manual techniques;Passive range of motion;Energy conservation   PT Next Visit Plan Progress balance and strengthening   PT Home Exercise Plan Hip 3 ways with RTB, mini squats, RTB resisted R ankle DF, toe taps, semitandem/tandem balance;   Consulted and Agree with Plan of Care Patient      Patient will benefit from skilled therapeutic intervention in order to improve the following deficits and impairments:  Abnormal gait, Decreased balance, Decreased coordination, Decreased mobility, Decreased strength, Difficulty walking  Visit Diagnosis: Hemiplegia and hemiparesis following cerebral infarction affecting right dominant side (HCC)  Muscle weakness (generalized)  Unsteadiness  on feet     Problem List Patient Active Problem List   Diagnosis Date Noted  . Hemiparesis affecting right side as late effect of cerebrovascular accident (CVA) (HCC) 03/16/2017  . Aphasia, late effect of cerebrovascular disease 03/16/2017  . Gait disturbance, post-stroke 03/16/2017  . Dysarthria as late effect of stroke 03/16/2017  . Carotid artery stenosis with cerebral infarction (HCC) 03/07/2017  . Language impairment   . ICAO (internal carotid artery occlusion), left 02/22/2017  . Acute respiratory failure (HCC) 02/22/2017  . Obesity 02/22/2017  . Expressive aphasia 02/22/2017  . Acute ischemic left MCA stroke (HCC) 02/22/2017  . Left hemiparesis (HCC)   . Status post total bilateral knee replacement   . Benign essential HTN   . Right-sided extracranial carotid artery stenosis   . Dysphagia, post-stroke   . Acute blood loss anemia   . Nocturia   . Newly diagnosed diabetes (HCC)   . CVA (cerebral vascular accident) (HCC) - L MCA s/p tPA and thrombectomy 02/16/2017  . Anxiety 04/30/2015  . Benign fibroma of prostate 04/30/2015  . Clinical depression 04/30/2015  . Acid reflux 04/30/2015  . HLD (hyperlipidemia) 04/30/2015  . BP (high blood pressure) 04/30/2015  . Arthritis, degenerative 04/30/2015  . 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 Arthritis, septic, knee (HCC) 09/26/2012     Encarnacion Chu PT, DPT 03/20/2017, 3:59 PM  [facility] MAIN [facility] SERVICES [address] [place], [place], [address] Phone: [phone]   Fax:  [phone]  Name: Erik [name] MRN: [id number] Date of Birth: January 02, 1956

## 2017-03-21 ENCOUNTER — Other Ambulatory Visit (HOSPITAL_COMMUNITY): Payer: Self-pay | Admitting: Interventional Radiology

## 2017-03-21 DIAGNOSIS — I639 Cerebral infarction, unspecified: Secondary | ICD-10-CM

## 2017-03-22 ENCOUNTER — Ambulatory Visit: Payer: Managed Care, Other (non HMO) | Admitting: Occupational Therapy

## 2017-03-22 ENCOUNTER — Telehealth: Payer: Self-pay | Admitting: Registered Nurse

## 2017-03-22 ENCOUNTER — Encounter: Payer: Self-pay | Admitting: Physical Therapy

## 2017-03-22 ENCOUNTER — Encounter: Payer: Self-pay | Admitting: Speech Pathology

## 2017-03-22 ENCOUNTER — Encounter: Payer: Self-pay | Admitting: Occupational Therapy

## 2017-03-22 ENCOUNTER — Ambulatory Visit: Payer: Managed Care, Other (non HMO) | Admitting: Physical Therapy

## 2017-03-22 ENCOUNTER — Ambulatory Visit: Payer: Managed Care, Other (non HMO) | Admitting: Speech Pathology

## 2017-03-22 DIAGNOSIS — R278 Other lack of coordination: Secondary | ICD-10-CM

## 2017-03-22 DIAGNOSIS — M6281 Muscle weakness (generalized): Secondary | ICD-10-CM | POA: Diagnosis not present

## 2017-03-22 DIAGNOSIS — R2681 Unsteadiness on feet: Secondary | ICD-10-CM

## 2017-03-22 DIAGNOSIS — R471 Dysarthria and anarthria: Secondary | ICD-10-CM

## 2017-03-22 DIAGNOSIS — I69351 Hemiplegia and hemiparesis following cerebral infarction affecting right dominant side: Secondary | ICD-10-CM

## 2017-03-22 DIAGNOSIS — R4701 Aphasia: Secondary | ICD-10-CM

## 2017-03-22 NOTE — Therapy (Signed)
[facility] MAIN [facility] SERVICES [address] [place], [place], [address] Phone: [phone]   Fax:  [phone]  Physical Therapy Treatment  Patient Details  Name: Erik [name] MRN: [id number] Date of Birth: 1956-04-18 Referring Provider: Cruzita Lederer  Encounter Date: 03/22/2017      PT End of Session - 03/22/17 1425    Visit Number 3   Number of Visits 17   Date for PT Re-Evaluation 05/09/17   PT Start Time 1530   PT Stop Time 1619   PT Time Calculation (min) 49 min   Equipment Utilized During Treatment Gait belt   Activity Tolerance Patient tolerated treatment well   Behavior During Therapy [facility] for tasks assessed/performed      Past Medical History:  Diagnosis Date  . Anxiety   . Arthritis    hands  . BPH (benign prostatic hyperplasia)   . Depression   . 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 ED (erectile dysfunction)   . GERD (gastroesophageal reflux disease)   . H/O calcium pyrophosphate deposition disease (CPPD)   . HLD (hyperlipidemia)   . Hypertension   . Hypogonadism in male   . Lower urinary tract symptoms (LUTS)   . Nocturia   . Stroke [facility])    Language Impairment.  Left Hemiparesis- improved  . Testosterone overdose   . Vitamin D deficiency     Past Surgical History:  Procedure Laterality Date  . BACK SURGERY    . COLON SURGERY     Colon Resection  . COLONOSCOPY    . IR ANGIO INTRA EXTRACRAN SEL COM CAROTID INNOMINATE UNI L MOD SED  03/07/2017  . IR ANGIO INTRA EXTRACRAN SEL COM CAROTID INNOMINATE UNI R MOD SED  02/16/2017  . IR ANGIO VERTEBRAL SEL SUBCLAVIAN INNOMINATE UNI L MOD SED  03/07/2017  . IR ANGIO VERTEBRAL SEL VERTEBRAL UNI R MOD SED  03/07/2017  . IR INFUSION THROMBOL ARTERIAL INITIAL (MS)  03/07/2017  . IR INTRAVSC STENT CERV CAROTID W/EMB-PROT MOD SED INCL ANGIO  03/07/2017  . IR PERCUTANEOUS ART THROMBECTOMY/INFUSION INTRACRANIAL INC DIAG ANGIO  02/16/2017  . IR PTA NON  CORO-LOWER EXTREM  02/16/2017  . IR US GUIDE VASC ACCESS LEFT  02/16/2017  . IR US GUIDE VASC ACCESS RIGHT  02/16/2017  . KNEE ARTHROSCOPY Bilateral   . RADIOLOGY WITH ANESTHESIA N/A 02/16/2017   Procedure: RADIOLOGY WITH ANESTHESIA;  Surgeon: Julieanne Cotton, MD;  Location: [facility] OR;  Service: Radiology;  Laterality: N/A;  . RADIOLOGY WITH ANESTHESIA N/A 03/07/2017   Procedure: CAROTID STENT;  Surgeon: Julieanne Cotton, MD;  Location: [facility] OR;  Service: Radiology;  Laterality: N/A;  . ROTATOR CUFF REPAIR Right     There were no vitals filed for this visit.      Subjective Assessment - 03/22/17 1550    Subjective Pt reports he is doing well today.  No complaints or concerns at this time.   Pertinent History Pt. is a 61 y.o. male who had a CVA on the evening of April 12th, 2018. Pt had been to his grandson's ball game that day and watched a movie with his wife. He got in bed and his wife went to take a bath. When she came out pt was on the floor struggling to get back in bed. Pt had become weak while in bed, tried to sit up and fell out of the bed. She could tell he was having a stroke, and called EMS.  Pt was transported to [facility] and CTA head/neck revealed hyperdense L-MCA sign likely indicating acute thrombus. He received tPA and he underwent cerebral angiogram with L-MCA thrombectomy, dense calcified plaque L-ICA precluded stent placement and noted to have 73% stenosis R-ICA due to advanced atherosclerotic changes. Patient with resultant facial weakness with mild dysarthria, aphasia, mild left gaze preference and right sided weakness affecting mobility and ability to carry out ADL tasks. He was in CIR for approximately 8 days per patient and then discharged home. He has been evaluated by outpatient speech and occupational therapy and is now here with wife today for PT evaluation.    Patient Stated Goals Improve his strength and balance in order to improve his function at home and be able to return  to work.    Currently in Pain? No/denies      Therapeutic Exercise:  Lateral walks with GTB around ankles with cues for slight knee bend for greater glute recruitment x8 lengths in // bars   Forward stepping over cones (RLE only) with emphasis on R DF x6 lengths in // bars. Noted that R knee tends to hyperextend when pt accepts full weight on RLE.   R single leg squat down to chair with poor eccentric control at end. UE support on // bars to maintain balance. 2x10   Standing marching with RTB resistance x10 each LE. Cues to avoid compensatory trunk extension.   Alternating toe taps up to 8" step x 20 each LE. Cues to tap with R heel rather than toes for increased R DF.   Lateral jumping from L to R foot x5 each side. Noted difficulty with push off from RLE.   Heel raises Bil concentric and RLE concentric, very challenging for pt. 2x13 (added to HEP)   RLE leg press 90# x15, 105# x15 with fatigue reported and noted at end of second set.   RLE leg press calf bias 60# 2x15, challenging for pt   Standing hamstring curls with BTB 2x10 (very challenging for pt). Added to HEP but instructed pt to use GTB.     Neuromuscular Re-ed:  Stepping from airex to another airex leading with LLE x10 and then RLE x10. Intermittent support from // bars.   SLS RLE 10x15 seconds on airex. Cues to distribute weight evenly throughout base of foot for greater stability and greater glute recruitment (pt has tendency to shift weight on forefoot)   Tandem walking on airex x6 lengths in // bars with intermittent UE support   Walking on balance stones in // bars to challenge balance/SLS and to work on R PF. Cues to land on forefoot for greater PF activation as pt demonstrates preference for midfoot on R. X4 lengths in // bars          PT Education - 03/22/17 1551    Education provided Yes   Education Details Exercise technique; clinical reasoning behind interventions; added to HEP and provided handout    Person(s) Educated Patient   Methods Explanation;Demonstration;Verbal cues;Handout   Comprehension Verbalized understanding;Returned demonstration;Verbal cues required;Need further instruction             PT Long Term Goals - 03/14/17 1513      PT LONG TERM GOAL #1   Title Pt will be independent with HEP in order to improve strength and balance in order to decrease fall risk and improve function at home and work.    Time 8   Period Weeks   Status New  PT LONG TERM GOAL #2   Title Pt will improve BERG by at least 3 points in order to demonstrate clinically significant improvement in balance.   Baseline 03/14/17: 49/56   Time 8   Period Weeks   Status New     PT LONG TERM GOAL #3   Title Pt will improve DGI by at least 3 points in order to demonstrate clinically significant improvement in balance and decreased risk for falls    Baseline 03/14/17: 17/24   Time 8   Period Weeks   Status New     PT LONG TERM GOAL #4   Title Pt will improve R hip abduction, adduction, and flexion strength by at least 1/2 MMT in order to improve gait    Baseline 03/14/17: see flowsheet   Time 8   Status New               Plan - 03/22/17 1621    Clinical Impression Statement Pt demonstrates decreased functional strength in R hamstring, PF, and DF and thus interventions focued on challenging these muscle groups which pt tolerated well.  He was provided with two new strengthening exercises (R heel raises and R hamstring curl) to perform as part of his HEP.  He remains extremely motivated and will benefit from continued skilled PT interventions for improved strength, balance, gait mechanics, and improved QOL.   Rehab Potential Good   Clinical Impairments Affecting Rehab Potential Positive: age, motivation, social support; Negative: significant ICA stenosis   PT Frequency 2x / week   PT Duration 8 weeks   PT Treatment/Interventions ADLs/Self Care Home Management;Aquatic  Therapy;Biofeedback;Cryotherapy;Electrical Stimulation;Iontophoresis 4mg /ml Dexamethasone;Moist Heat;Ultrasound;DME Instruction;Gait training;Stair training;Functional mobility training;Therapeutic activities;Therapeutic exercise;Balance training;Neuromuscular re-education;Patient/family education;Manual techniques;Passive range of motion;Energy conservation   PT Next Visit Plan Progress balance and strengthening   PT Home Exercise Plan Hip 3 ways with RTB, mini squats, RTB resisted R ankle DF, toe taps, semitandem/tandem balance; Bil concentric and eccentric R calf raises; R hamstring curls wiith GTB   Consulted and Agree with Plan of Care Patient      Patient will benefit from skilled therapeutic intervention in order to improve the following deficits and impairments:  Abnormal gait, Decreased balance, Decreased coordination, Decreased mobility, Decreased strength, Difficulty walking  Visit Diagnosis: 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 Hemiparesis affecting right side as late effect of cerebrovascular accident (CVA) (HCC) 03/16/2017  . Aphasia, late effect of cerebrovascular disease 03/16/2017  . Gait disturbance, post-stroke 03/16/2017  . Dysarthria as late effect of stroke 03/16/2017  . Carotid artery stenosis with cerebral infarction (HCC) 03/07/2017  . Language impairment   . ICAO (internal carotid artery occlusion), left 02/22/2017  . Acute respiratory failure (HCC) 02/22/2017  . Obesity 02/22/2017  . Expressive aphasia 02/22/2017  . Acute ischemic left MCA stroke (HCC) 02/22/2017  . Left hemiparesis (HCC)   . Status post total bilateral knee replacement   . Benign essential HTN   . Right-sided extracranial carotid artery stenosis   . Dysphagia, post-stroke   . Acute blood loss anemia   .  Nocturia   . Newly diagnosed diabetes (HCC)   . CVA (cerebral vascular accident) (HCC) - L MCA s/p tPA and thrombectomy 02/16/2017  . Anxiety 04/30/2015  . Benign fibroma of prostate 04/30/2015  . Clinical depression 04/30/2015  . Acid reflux  04/30/2015  . HLD (hyperlipidemia) 04/30/2015  . BP (high blood pressure) 04/30/2015  . Arthritis, degenerative 04/30/2015  . 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 Arthritis, septic, knee (HCC) 09/26/2012     Encarnacion ChuAshley Hermione Havlicek PT, DPT 03/22/2017, 4:29 PM  [place] [facility] [address] [place], [place], [address] Phone: [phone]   Fax:  [phone]  Name: Erik [name] MRN: [id number] Date of Birth: 03-11-1956

## 2017-03-22 NOTE — Therapy (Addendum)
Erik [name] MAIN [facility] SERVICES [address] [place], [place], [address] Phone: [phone]   Fax:  [phone]  Occupational Therapy Treatment  Patient Details  Name: Erik [name] MRN: [id number] Date of Birth: 1956/06/18 Referring Provider: Dr. Hildred Alamin  Encounter Date: 03/22/2017      OT End of Session - 03/22/17 1456    Visit Number 3   Number of Visits 24   Date for OT Re-Evaluation 06/04/17   OT Start Time 1440   OT Stop Time 1525   OT Time Calculation (min) 45 min   Activity Tolerance Patient tolerated treatment well   Behavior During Therapy [facility] for tasks assessed/performed      Past Medical History:  Diagnosis Date  . Anxiety   . Arthritis    hands  . BPH (benign prostatic hyperplasia)   . Depression   . 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 ED (erectile dysfunction)   . GERD (gastroesophageal reflux disease)   . H/O calcium pyrophosphate deposition disease (CPPD)   . HLD (hyperlipidemia)   . Hypertension   . Hypogonadism in male   . Lower urinary tract symptoms (LUTS)   . Nocturia   . Stroke [name])    Language Impairment.  Left Hemiparesis- improved  . Testosterone overdose   . Vitamin D deficiency     Past Surgical History:  Procedure Laterality Date  . BACK SURGERY    . COLON SURGERY     Colon Resection  . COLONOSCOPY    . IR ANGIO INTRA EXTRACRAN SEL COM CAROTID INNOMINATE UNI L MOD SED  03/07/2017  . IR ANGIO INTRA EXTRACRAN SEL COM CAROTID INNOMINATE UNI R MOD SED  02/16/2017  . IR ANGIO VERTEBRAL SEL SUBCLAVIAN INNOMINATE UNI L MOD SED  03/07/2017  . IR ANGIO VERTEBRAL SEL VERTEBRAL UNI R MOD SED  03/07/2017  . IR INFUSION THROMBOL ARTERIAL INITIAL (MS)  03/07/2017  . IR INTRAVSC STENT CERV CAROTID W/EMB-PROT MOD SED INCL ANGIO  03/07/2017  . IR PERCUTANEOUS ART THROMBECTOMY/INFUSION INTRACRANIAL INC DIAG ANGIO  02/16/2017  . IR PTA NON CORO-LOWER EXTREM  02/16/2017  . IR US GUIDE VASC  ACCESS LEFT  02/16/2017  . IR US GUIDE VASC ACCESS RIGHT  02/16/2017  . KNEE ARTHROSCOPY Bilateral   . RADIOLOGY WITH ANESTHESIA N/A 02/16/2017   Procedure: RADIOLOGY WITH ANESTHESIA;  Surgeon: Julieanne Cotton, MD;  Location: [facility] OR;  Service: Radiology;  Laterality: N/A;  . RADIOLOGY WITH ANESTHESIA N/A 03/07/2017   Procedure: CAROTID STENT;  Surgeon: Julieanne Cotton, MD;  Location: [facility] OR;  Service: Radiology;  Laterality: N/A;  . ROTATOR CUFF REPAIR Right     There were no vitals filed for this visit.      Subjective Assessment - 03/22/17 1444    Subjective  Pt. reports he feels like his right hand is getting better.   Patient is accompained by: Family member   Pertinent History Pt. is a 61 y.o. male who had a CVA on the evening of April 12th, 2018. Pt. had been to his grandson's ball game, and was preparing for bed when he felling getting into bed. According to his wife, he struggled to get up. She could tell he was having a stroke, and called EMS. Pt. was transported to [facility],, was administered TPA,. Pt. underwent a cerebral angiogram with Rt ICA stent assisted angioplasty. Pt. received Therapy services in CIR. at [facility].   Currently in Pain? No/denies  OT TREATMENT    Neuro muscular re-education:  Pt. performed right [facility] skills training to improve speed and dexterity needed for ADL tasks and writing. Pt. demonstrated grasping 1 inch sticks,  inch cylindrical collars, and  inch flat washers on the Purdue pegboard. Pt. performed grasping each item with her 2nd digit and thumb, and storing them in the palm. Pt. presented with difficulty storing  inch objects at a time in the palmar aspect of the hand. Pt. worked on alternating bilateral hand movements. Pt. Requires increased time to complete each section. Pt. performed resistive EZ Board exercises for forearm supination/pronation, wrist flexion/extension using gross grasp, and lateral pinch (key) grasp. Pt.  performed resistive EZ Board exercises angled in several planes to promote shoulder flexion, abduction, and wrist flexion, and extension while performing resistive wrist flexion and extension with a gross grip.                      OT Education - 03/22/17 1456    Education provided Yes   Person(s) Educated Patient   Methods Explanation;Demonstration;Verbal cues   Comprehension Verbalized understanding;Returned demonstration;Verbal cues required;Need further instruction             OT Long Term Goals - 03/12/17 1228      OT LONG TERM GOAL #1   Title Pt. will independently, and efficiently write 3 sentences with 100% legibility.   Baseline Increased time required. 90% legibility for only 2 words in large print.   Time 12   Period Weeks   Status New     OT LONG TERM GOAL #2   Title Pt. will independently type a paragraph efficiently.   Baseline Pt. is unable   Time 12   Period Weeks   Status New     OT LONG TERM GOAL #3   Title Pt. improve right grip strength by 5 pounds to be able to hold and use a wrench.   Baseline Pt. is unable   Time 12   Period Weeks   Status New     OT LONG TERM GOAL #4   Title Pt. will improve right lateral pinch strength by 3# to be able to put toothpaste on a toothbrush.   Baseline Pt. is unable to apply toothpaste onto a toothbrush.   Time 12   Period Weeks   Status New     OT LONG TERM GOAL #5   Title Pt. will improve FMC by 10 sec. to be able to independently handle, and count change.   Baseline Pt. has difficulty   Time 12   Period Weeks   Status New     Long Term Additional Goals   Additional Long Term Goals Yes     OT LONG TERM GOAL #6   Title Pt. will increase RUE strength by 2mm grades to be able to complete ADLs/IADLs.   Baseline Pt. has difficulty   Time 12   Period Weeks   Status New     OT LONG TERM GOAL #7   Title Pt. will demonstrate independence with visual compensatory strategies for the right  100% of the time during ADL, and IADL tasks.   Baseline Limited on the right. Pt. occ. misses obstacle on the right.   Time 12   Period Weeks   Status New               Plan - 03/22/17 1457    Clinical Impression Statement Pt. continues to present with  right hand weakness, impaired motor control, and incoordination. Pt. reports he is using his right hand to hold a mug, and drink from it.  Pt. is using his right hand to to use utensils more during meals. Pt. continues to wrok on improving RUE strength, and coordination skills.   Rehab Potential Good   OT Frequency 2x / week   OT Duration 12 weeks   Consulted and Agree with Plan of Care Patient;Family member/caregiver      Patient will benefit from skilled therapeutic intervention in order to improve the following deficits and impairments:  Decreased coordination, Decreased balance, Impaired UE functional use, Decreased cognition, Impaired vision/preception, Decreased strength, Decreased knowledge of use of DME, Decreased activity tolerance, Impaired tone, Impaired flexibility, Decreased range of motion, Decreased endurance  Visit Diagnosis: Muscle weakness (generalized)  Other lack of coordination    Problem List Patient Active Problem List   Diagnosis Date Noted  . Hemiparesis affecting right side as late effect of cerebrovascular accident (CVA) (HCC) 03/16/2017  . Aphasia, late effect of cerebrovascular disease 03/16/2017  . Gait disturbance, post-stroke 03/16/2017  . Dysarthria as late effect of stroke 03/16/2017  . Carotid artery stenosis with cerebral infarction (HCC) 03/07/2017  . Language impairment   . ICAO (internal carotid artery occlusion), left 02/22/2017  . Acute respiratory failure (HCC) 02/22/2017  . Obesity 02/22/2017  . Expressive aphasia 02/22/2017  . Acute ischemic left MCA stroke (HCC) 02/22/2017  . Left hemiparesis (HCC)   . Status post total bilateral knee replacement   . Benign essential HTN   .  Right-sided extracranial carotid artery stenosis   . Dysphagia, post-stroke   . Acute blood loss anemia   . Nocturia   . Newly diagnosed diabetes (HCC)   . CVA (cerebral vascular accident) (HCC) - L MCA s/p tPA and thrombectomy 02/16/2017  . Anxiety 04/30/2015  . Benign fibroma of prostate 04/30/2015  . Clinical depression 04/30/2015  . Acid reflux 04/30/2015  . HLD (hyperlipidemia) 04/30/2015  . BP (high blood pressure) 04/30/2015  . Arthritis, degenerative 04/30/2015  . 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 Arthritis, septic, knee (HCC) 09/26/2012    Olegario Messier 03/22/2017, 3:14 PM   [facility] MAIN [facility] SERVICES [address] [place], [place], [address] Phone: [phone]   Fax:  [phone]  Name: DON TIU MRN: [id number] Date of Birth: 03-03-1956

## 2017-03-22 NOTE — Telephone Encounter (Signed)
Return Erik [name] call [phone]( [phone]), regarding the [employer] paperwork. She states Rosann AuerbachCigna wants more information regarding her husband receiving speech therapy. She will re-send the information today. I explained this information will be given to Cherlyn CushingLisa Kellner manager. She verbalizes understanding.

## 2017-03-22 NOTE — Therapy (Signed)
[facility] MAIN [facility] SERVICES [address] [place], [place], [address] Phone: [phone]   Fax:  [phone]  Speech Language Pathology Treatment  Patient Details  Name: Erik [name] MRN: [id number] Date of Birth: Oct 06, 1956 Referring Provider: Charlton Amor   Encounter Date: 03/22/2017      End of Session - 03/22/17 1618    Visit Number 3   Number of Visits 25   Date for SLP Re-Evaluation 06/12/17   SLP Start Time 1308   SLP Stop Time  1355   SLP Time Calculation (min) 47 min   Activity Tolerance Patient tolerated treatment well      Past Medical History:  Diagnosis Date  . Anxiety   . Arthritis    hands  . BPH (benign prostatic hyperplasia)   . Depression   . 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 ED (erectile dysfunction)   . GERD (gastroesophageal reflux disease)   . H/O calcium pyrophosphate deposition disease (CPPD)   . HLD (hyperlipidemia)   . Hypertension   . Hypogonadism in male   . Lower urinary tract symptoms (LUTS)   . Nocturia   . Stroke [facility])    Language Impairment.  Left Hemiparesis- improved  . Testosterone overdose   . Vitamin D deficiency     Past Surgical History:  Procedure Laterality Date  . BACK SURGERY    . COLON SURGERY     Colon Resection  . COLONOSCOPY    . IR ANGIO INTRA EXTRACRAN SEL COM CAROTID INNOMINATE UNI L MOD SED  03/07/2017  . IR ANGIO INTRA EXTRACRAN SEL COM CAROTID INNOMINATE UNI R MOD SED  02/16/2017  . IR ANGIO VERTEBRAL SEL SUBCLAVIAN INNOMINATE UNI L MOD SED  03/07/2017  . IR ANGIO VERTEBRAL SEL VERTEBRAL UNI R MOD SED  03/07/2017  . IR INFUSION THROMBOL ARTERIAL INITIAL (MS)  03/07/2017  . IR INTRAVSC STENT CERV CAROTID W/EMB-PROT MOD SED INCL ANGIO  03/07/2017  . IR PERCUTANEOUS ART THROMBECTOMY/INFUSION INTRACRANIAL INC DIAG ANGIO  02/16/2017  . IR PTA NON CORO-LOWER EXTREM  02/16/2017  . IR US GUIDE VASC ACCESS LEFT  02/16/2017  . IR US GUIDE VASC  ACCESS RIGHT  02/16/2017  . KNEE ARTHROSCOPY Bilateral   . RADIOLOGY WITH ANESTHESIA N/A 02/16/2017   Procedure: RADIOLOGY WITH ANESTHESIA;  Surgeon: Julieanne Cotton, MD;  Location: [facility] OR;  Service: Radiology;  Laterality: N/A;  . RADIOLOGY WITH ANESTHESIA N/A 03/07/2017   Procedure: CAROTID STENT;  Surgeon: Julieanne Cotton, MD;  Location: [facility] OR;  Service: Radiology;  Laterality: N/A;  . ROTATOR CUFF REPAIR Right     There were no vitals filed for this visit.      Subjective Assessment - 03/22/17 1617    Subjective "I've always been a fast talker" "I never di spell well"   Currently in Pain? No/denies               ADULT SLP TREATMENT - 03/22/17 0001      General Information   Behavior/Cognition Alert;Cooperative;Pleasant mood   HPI 61 year old man with left MCA CVA 02/15/2017.  Patient received inpatient rehab services, including SLP, 02/22/2017-03/02/2017.     Treatment Provided   Treatment provided Cognitive-Linquistic     Pain Assessment   Pain Assessment No/denies pain     Cognitive-Linquistic Treatment   Treatment focused on Aphasia;Dysarthria   Skilled Treatment SPEECH: Generate simple sentences given complex picture stimulus with 75% intelligibility and conversation  with 75% using slowed rate.  LANGUAGE: Name concrete categories given 3 members with 90% accuracy.  Generate cogent/grammatical sentences given complex picture stimulus with 75% accuracy given cues to say each word.     Assessment / Recommendations / Plan   Plan Continue with current plan of care     Progression Toward Goals   Progression toward goals Progressing toward goals          SLP Education - 03/22/17 1617    Education provided Yes   Education Details Slow rate to improve intelligibility   Person(s) Educated Patient   Methods Explanation   Comprehension Verbalized understanding            SLP Long Term Goals - 03/13/17 1425      SLP LONG TERM GOAL #1   Title Pt will improve  speech intelligibility in response to moderately complex cognitive linguistic tasks by controlling rate of speech, over-articulation, and increased loudness to achieve 80% intelligibility.   Time 12   Period Weeks   Status New     SLP LONG TERM GOAL #2   Title Patient will read aloud sentences and multi-syllabic words, maintaining phonemic accuracy, with 80% accuracy.     Time 12   Period Weeks   Status New     SLP LONG TERM GOAL #3   Title Patient will demonstrate reading comprehension for paragraphs with 80% accuracy.    Time 12   Period Weeks   Status New     SLP LONG TERM GOAL #4   Title Patient will complete high level word finding tasks with 80% accuracy.   Time 12   Period Weeks   Status New          Plan - 03/22/17 1618    Clinical Impression Statement The patient is able to improve speech intelligibility by slowing the rate.  The patient is having significant difficulty with decoding written language.  Will continue to focus on speech/language needs using reading as primary tool.   Speech Therapy Frequency 2x / week   Duration Other (comment)   Treatment/Interventions Patient/family education;Compensatory strategies;SLP instruction and feedback;Functional tasks;Cueing hierarchy   Potential to Achieve Goals Good   Potential Considerations Ability to learn/carryover information;Co-morbidities;Cooperation/participation level;Medical prognosis;Previous level of function;Severity of impairments;Family/community support   SLP Home Exercise Plan s/sh contrast, simple reading   Consulted and Agree with Plan of Care Patient      Patient will benefit from skilled therapeutic intervention in order to improve the following deficits and impairments:   Aphasia  Dysarthria and anarthria    Problem List Patient Active Problem List   Diagnosis Date Noted  . Hemiparesis affecting right side as late effect of cerebrovascular accident (CVA) (HCC) 03/16/2017  . Aphasia, late  effect of cerebrovascular disease 03/16/2017  . Gait disturbance, post-stroke 03/16/2017  . Dysarthria as late effect of stroke 03/16/2017  . Carotid artery stenosis with cerebral infarction (HCC) 03/07/2017  . Language impairment   . ICAO (internal carotid artery occlusion), left 02/22/2017  . Acute respiratory failure (HCC) 02/22/2017  . Obesity 02/22/2017  . Expressive aphasia 02/22/2017  . Acute ischemic left MCA stroke (HCC) 02/22/2017  . Left hemiparesis (HCC)   . Status post total bilateral knee replacement   . Benign essential HTN   . Right-sided extracranial carotid artery stenosis   . Dysphagia, post-stroke   . Acute blood loss anemia   . Nocturia   . Newly diagnosed diabetes (HCC)   . CVA (cerebral vascular accident) (  HCC) - L MCA s/p tPA and thrombectomy 02/16/2017  . Anxiety 04/30/2015  . Benign fibroma of prostate 04/30/2015  . Clinical depression 04/30/2015  . Acid reflux 04/30/2015  . HLD (hyperlipidemia) 04/30/2015  . BP (high blood pressure) 04/30/2015  . Arthritis, degenerative 04/30/2015  . 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 Arthritis, septic, knee (HCC) 09/26/2012   Dollene Primrose, MS/CCC- SLP  Leandrew Koyanagi 03/22/2017, 4:19 PM  [place] [facility] MAIN [facility] SERVICES [address] [place], [place], [address] Phone: [phone]   Fax:  [phone]   Name: Erik [name] MRN: [id number] Date of Birth: August 01, 1956

## 2017-03-26 ENCOUNTER — Ambulatory Visit: Payer: Managed Care, Other (non HMO) | Admitting: Occupational Therapy

## 2017-03-26 ENCOUNTER — Ambulatory Visit: Payer: Managed Care, Other (non HMO)

## 2017-03-26 DIAGNOSIS — R278 Other lack of coordination: Secondary | ICD-10-CM

## 2017-03-26 DIAGNOSIS — I69351 Hemiplegia and hemiparesis following cerebral infarction affecting right dominant side: Secondary | ICD-10-CM

## 2017-03-26 DIAGNOSIS — M6281 Muscle weakness (generalized): Secondary | ICD-10-CM | POA: Diagnosis not present

## 2017-03-26 DIAGNOSIS — R2681 Unsteadiness on feet: Secondary | ICD-10-CM

## 2017-03-26 NOTE — Therapy (Signed)
[facility] MAIN [facility] SERVICES [address] [place], [place], [address] Phone: [phone]   Fax:  [phone]  Physical Therapy Treatment  Patient Details  Name: Erik [name] MRN: [id number] Date of Birth: [birth year] Referring Provider: Cruzita Lederer  Encounter Date: 03/26/2017      PT End of Session - 03/26/17 1445    Visit Number 4   Number of Visits 17   PT Start Time 1355   PT Stop Time 1438   PT Time Calculation (min) 43 min   Equipment Utilized During Treatment --  min guard to S prn   Behavior During Therapy [facility] for tasks assessed/performed      Past Medical History:  Diagnosis Date  . Anxiety   . Arthritis    hands  . BPH (benign prostatic hyperplasia)   . Depression   . 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 ED (erectile dysfunction)   . GERD (gastroesophageal reflux disease)   . H/O calcium pyrophosphate deposition disease (CPPD)   . HLD (hyperlipidemia)   . Hypertension   . Hypogonadism in male   . Lower urinary tract symptoms (LUTS)   . Nocturia   . Stroke [facility])    Language Impairment.  Left Hemiparesis- improved  . Testosterone overdose   . Vitamin D deficiency     Past Surgical History:  Procedure Laterality Date  . BACK SURGERY    . COLON SURGERY     Colon Resection  . COLONOSCOPY    . IR ANGIO INTRA EXTRACRAN SEL COM CAROTID INNOMINATE UNI L MOD SED  03/07/2017  . IR ANGIO INTRA EXTRACRAN SEL COM CAROTID INNOMINATE UNI R MOD SED  02/16/2017  . IR ANGIO VERTEBRAL SEL SUBCLAVIAN INNOMINATE UNI L MOD SED  03/07/2017  . IR ANGIO VERTEBRAL SEL VERTEBRAL UNI R MOD SED  03/07/2017  . IR INFUSION THROMBOL ARTERIAL INITIAL (MS)  03/07/2017  . IR INTRAVSC STENT CERV CAROTID W/EMB-PROT MOD SED INCL ANGIO  03/07/2017  . IR PERCUTANEOUS ART THROMBECTOMY/INFUSION INTRACRANIAL INC DIAG ANGIO  02/16/2017  . IR PTA NON CORO-LOWER EXTREM  02/16/2017  . IR US GUIDE VASC ACCESS LEFT  02/16/2017  . IR  US GUIDE VASC ACCESS RIGHT  02/16/2017  . KNEE ARTHROSCOPY Bilateral   . RADIOLOGY WITH ANESTHESIA N/A 02/16/2017   Procedure: RADIOLOGY WITH ANESTHESIA;  Surgeon: Julieanne Cotton, MD;  Location: [facility] OR;  Service: Radiology;  Laterality: N/A;  . RADIOLOGY WITH ANESTHESIA N/A 03/07/2017   Procedure: CAROTID STENT;  Surgeon: Julieanne Cotton, MD;  Location: [facility] OR;  Service: Radiology;  Laterality: N/A;  . ROTATOR CUFF REPAIR Right     There were no vitals filed for this visit.      Subjective Assessment - 03/26/17 1357    Subjective Pt denied falls or changes since last visit.    Pertinent History Pt. is a 61 y.o. male who had a CVA on the evening of April 12th, 2018. Pt had been to his grandson's ball game that day and watched a movie with his wife. He got in bed and his wife went to take a bath. When she came out pt was on the floor struggling to get back in bed. Pt had become weak while in bed, tried to sit up and fell out of the bed. She could tell he was having a stroke, and called EMS. Pt was transported to [facility] and CTA head/neck revealed hyperdense L-MCA sign likely indicating acute  thrombus. He received tPA and he underwent cerebral angiogram with L-MCA thrombectomy, dense calcified plaque L-ICA precluded stent placement and noted to have 73% stenosis R-ICA due to advanced atherosclerotic changes. Patient with resultant facial weakness with mild dysarthria, aphasia, mild left gaze preference and right sided weakness affecting mobility and ability to carry out ADL tasks. He was in CIR for approximately 8 days per patient and then discharged home. He has been evaluated by outpatient speech and occupational therapy and is now here with wife today for PT evaluation.    Patient Stated Goals Improve his strength and balance in order to improve his function at home and be able to return to work.    Currently in Pain? No/denies                         [facility] Adult PT  Treatment/Exercise - 03/26/17 1359      Ambulation/Gait   Ambulation/Gait Yes   Ambulation/Gait Assistance 5: Supervision   Ambulation/Gait Assistance Details Pt amb. without AFO donned and noted to experience R foot drop. Pt then amb. with R anterior Trulife AFO donned with improved R toe clearance.  PT wanted to attempt amb. with R foot up brace but one not present in clinic.   Ambulation Distance (Feet) 400 Feet  100   Assistive device None   Gait Pattern Step-through pattern;Decreased dorsiflexion - left;Decreased stride length;Narrow base of support   Ambulation Surface Level;Indoor     High Level Balance   High Level Balance Activities Backward walking;Head turns;Other (comment)  forward amb., heel/toe amb.   High Level Balance Comments Pt amb. over compliant (red mat) and non -compliat surfaces 4x8' with cues and demo for technique. Pt noted to experience R foot drop during high level balances activities without AFO donned and decr. lateral wt. shift and narrow BOS. Pt also amb. 2x40' backwards over even terrain with decr. R foot clearance and stride length. All performed with min guard for safety.                PT Education - 03/26/17 1444    Education provided Yes   Education Details PT provided pt with Ossur foot up brace (shoeless accessory) information and encouraged pt to order from [employer] at lesser cost, as this is less restrictive vs. R ant. AFO.   Person(s) Educated Patient   Methods Explanation   Comprehension Verbalized understanding             PT Long Term Goals - 03/14/17 1513      PT LONG TERM GOAL #1   Title Pt will be independent with HEP in order to improve strength and balance in order to decrease fall risk and improve function at home and work.    Time 8   Period Weeks   Status New     PT LONG TERM GOAL #2   Title Pt will improve BERG by at least 3 points in order to demonstrate clinically significant improvement in balance.   Baseline  03/14/17: 49/56   Time 8   Period Weeks   Status New     PT LONG TERM GOAL #3   Title Pt will improve DGI by at least 3 points in order to demonstrate clinically significant improvement in balance and decreased risk for falls    Baseline 03/14/17: 17/24   Time 8   Period Weeks   Status New     PT LONG TERM GOAL #4  Title Pt will improve R hip abduction, adduction, and flexion strength by at least 1/2 MMT in order to improve gait    Baseline 03/14/17: see flowsheet   Time 8   Status New               Plan - 03/26/17 1445    Clinical Impression Statement Pt demonstrated R foot drop during balance and gait activities, which decr. with R ant. AFO donned. However, best on pt's strength, PT recommended R Ossur Foot up brace, as this is less restrictive. Pt continues to experience incr. postural sway during high level balance activities and would continue to benefit from skilled PT to improve safety during functional mobility.    Rehab Potential Good   Clinical Impairments Affecting Rehab Potential Positive: age, motivation, social support; Negative: significant ICA stenosis   PT Frequency 2x / week   PT Duration 8 weeks   PT Treatment/Interventions ADLs/Self Care Home Management;Aquatic Therapy;Biofeedback;Cryotherapy;Electrical Stimulation;Iontophoresis 4mg /ml Dexamethasone;Moist Heat;Ultrasound;DME Instruction;Gait training;Stair training;Functional mobility training;Therapeutic activities;Therapeutic exercise;Balance training;Neuromuscular re-education;Patient/family education;Manual techniques;Passive range of motion;Energy conservation   PT Next Visit Plan Dynamic Gait training with arm swing, high level balance over compliant surfaces   PT Home Exercise Plan Hip 3 ways with RTB, mini squats, RTB resisted R ankle DF, toe taps, semitandem/tandem balance; Bil concentric and eccentric R calf raises; R hamstring curls wiith GTB   Consulted and Agree with Plan of Care Patient       Patient will benefit from skilled therapeutic intervention in order to improve the following deficits and impairments:  Abnormal gait, Decreased balance, Decreased coordination, Decreased mobility, Decreased strength, Difficulty walking  Visit Diagnosis: Muscle weakness (generalized)  Hemiplegia and hemiparesis following cerebral infarction affecting right dominant side (HCC)  Other lack of coordination  Unsteadiness on feet     Problem List Patient Active Problem List   Diagnosis Date Noted  . Hemiparesis affecting right side as late effect of cerebrovascular accident (CVA) (HCC) 03/16/2017  . Aphasia, late effect of cerebrovascular disease 03/16/2017  . Gait disturbance, post-stroke 03/16/2017  . Dysarthria as late effect of stroke 03/16/2017  . Carotid artery stenosis with cerebral infarction (HCC) 03/07/2017  . Language impairment   . ICAO (internal carotid artery occlusion), left 02/22/2017  . Acute respiratory failure (HCC) 02/22/2017  . Obesity 02/22/2017  . Expressive aphasia 02/22/2017  . Acute ischemic left MCA stroke (HCC) 02/22/2017  . Left hemiparesis (HCC)   . Status post total bilateral knee replacement   . Benign essential HTN   . Right-sided extracranial carotid artery stenosis   . Dysphagia, post-stroke   . Acute blood loss anemia   . Nocturia   . Newly diagnosed diabetes (HCC)   . CVA (cerebral vascular accident) (HCC) - L MCA s/p tPA and thrombectomy 02/16/2017  . Anxiety 04/30/2015  . Benign fibroma of prostate 04/30/2015  . Clinical depression 04/30/2015  . Acid reflux 04/30/2015  . HLD (hyperlipidemia) 04/30/2015  . BP (high blood pressure) 04/30/2015  . Arthritis, degenerative 04/30/2015  . 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 Arthritis, septic, knee (HCC) 09/26/2012    Pete,Latria Mccarron L 03/26/2017, 2:48 PM  [facility] [address] [place], [place],  [address] Phone: [phone]   Fax:  [phone]  Name: Erik [name] MRN: [id number] Date of Birth: [birth year]  Zerita BoersJennifer Melkonian, PT,DPT 03/26/17 2:48 PM Phone: [phone] Fax: [phone]

## 2017-03-28 ENCOUNTER — Encounter: Payer: Self-pay | Admitting: Occupational Therapy

## 2017-03-28 NOTE — Therapy (Signed)
[facility] [facility] MAIN [facility] SERVICES [address] [place], [place], [address] Phone: [phone]   Fax:  [phone]  Occupational Therapy Treatment  Patient Details  Name: Erik [name] MRN: [id number] Date of Birth: 1956-03-18 Referring Provider: Dr. Hildred Alamin  Encounter Date: 03/26/2017      OT End of Session - 03/28/17 1504    Visit Number 4   Number of Visits 24   Date for OT Re-Evaluation 06/04/17   OT Start Time 1303   OT Stop Time 1346   OT Time Calculation (min) 43 min   Activity Tolerance Patient tolerated treatment well   Behavior During Therapy [facility] for tasks assessed/performed      Past Medical History:  Diagnosis Date  . Anxiety   . Arthritis    hands  . BPH (benign prostatic hyperplasia)   . Depression   . 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 ED (erectile dysfunction)   . GERD (gastroesophageal reflux disease)   . H/O calcium pyrophosphate deposition disease (CPPD)   . HLD (hyperlipidemia)   . Hypertension   . Hypogonadism in male   . Lower urinary tract symptoms (LUTS)   . Nocturia   . Stroke [facility])    Language Impairment.  Left Hemiparesis- improved  . Testosterone overdose   . Vitamin D deficiency     Past Surgical History:  Procedure Laterality Date  . BACK SURGERY    . COLON SURGERY     Colon Resection  . COLONOSCOPY    . IR ANGIO INTRA EXTRACRAN SEL COM CAROTID INNOMINATE UNI L MOD SED  03/07/2017  . IR ANGIO INTRA EXTRACRAN SEL COM CAROTID INNOMINATE UNI R MOD SED  02/16/2017  . IR ANGIO VERTEBRAL SEL SUBCLAVIAN INNOMINATE UNI L MOD SED  03/07/2017  . IR ANGIO VERTEBRAL SEL VERTEBRAL UNI R MOD SED  03/07/2017  . IR INFUSION THROMBOL ARTERIAL INITIAL (MS)  03/07/2017  . IR INTRAVSC STENT CERV CAROTID W/EMB-PROT MOD SED INCL ANGIO  03/07/2017  . IR PERCUTANEOUS ART THROMBECTOMY/INFUSION INTRACRANIAL INC DIAG ANGIO  02/16/2017  . IR PTA NON CORO-LOWER EXTREM  02/16/2017  . IR US GUIDE VASC  ACCESS LEFT  02/16/2017  . IR US GUIDE VASC ACCESS RIGHT  02/16/2017  . KNEE ARTHROSCOPY Bilateral   . RADIOLOGY WITH ANESTHESIA N/A 02/16/2017   Procedure: RADIOLOGY WITH ANESTHESIA;  Surgeon: Julieanne Cotton, MD;  Location: [facility] OR;  Service: Radiology;  Laterality: N/A;  . RADIOLOGY WITH ANESTHESIA N/A 03/07/2017   Procedure: CAROTID STENT;  Surgeon: Julieanne Cotton, MD;  Location: [facility] OR;  Service: Radiology;  Laterality: N/A;  . ROTATOR CUFF REPAIR Right     There were no vitals filed for this visit.      Subjective Assessment - 03/28/17 1500    Subjective  Patient reports he is doing well, trying to use his hand as much as he can.     Pertinent History Pt. is a 61 y.o. male who had a CVA on the evening of April 12th, 2018. Pt. had been to his grandson's ball game, and was preparing for bed when he felling getting into bed. According to his wife, he struggled to get up. She could tell he was having a stroke, and called EMS. Pt. was [facility] to [facility],, was administered TPA,. Pt. underwent a cerebral angiogram with Rt ICA stent assisted angioplasty. Pt. received Therapy services in CIR. at [facility].   Patient Stated Goals To regain the  use of his right hand   Currently in Pain? No/denies   Pain Score 0-No pain                      OT Treatments/Exercises (OP) - 03/28/17 1500      Fine Motor Coordination   Other Fine Motor Exercises Patient seen for manipulation of [place] discs with turning and flipping from one side to another then instructed to turn 2 times before placing into container.  Patient performs movements slowly.  Manipulation of small,  inch washer and dowels, cues for prehension patterns, placing onto elevated surface with difficulty with hand eye coordination       Neurological Re-education Exercises   Other Exercises 1 Patient seen for right UE strengthening with 5# dowel exercises for 15 reps for shoulder flexion, ABD/ADD, forwards/backwards  circles, chest press with cues for technique.                OT Education - 03/28/17 1504    Education provided Yes   Education Details home exercises for strength, ROM and coordination skills    Person(s) Educated Patient   Methods Explanation;Demonstration;Verbal cues   Comprehension Verbal cues required;Returned demonstration;Verbalized understanding             OT Long Term Goals - 03/12/17 1228      OT LONG TERM GOAL #1   Title Pt. will independently, and efficiently write 3 sentences with 100% legibility.   Baseline Increased time required. 90% legibility for only 2 words in large print.   Time 12   Period Weeks   Status New     OT LONG TERM GOAL #2   Title Pt. will independently type a paragraph efficiently.   Baseline Pt. is unable   Time 12   Period Weeks   Status New     OT LONG TERM GOAL #3   Title Pt. improve right grip strength by 5 pounds to be able to hold and use a wrench.   Baseline Pt. is unable   Time 12   Period Weeks   Status New     OT LONG TERM GOAL #4   Title Pt. will improve right lateral pinch strength by 3# to be able to put toothpaste on a toothbrush.   Baseline Pt. is unable to apply toothpaste onto a toothbrush.   Time 12   Period Weeks   Status New     OT LONG TERM GOAL #5   Title Pt. will improve FMC by 10 sec. to be able to independently handle, and count change.   Baseline Pt. has difficulty   Time 12   Period Weeks   Status New     Long Term Additional Goals   Additional Long Term Goals Yes     OT LONG TERM GOAL #6   Title Pt. will increase RUE strength by 2mm grades to be able to complete ADLs/IADLs.   Baseline Pt. has difficulty   Time 12   Period Weeks   Status New     OT LONG TERM GOAL #7   Title Pt. will demonstrate independence with visual compensatory strategies for the right 100% of the time during ADL, and IADL tasks.   Baseline Limited on the right. Pt. occ. misses obstacle on the right.   Time 12    Period Weeks   Status New               Plan - 03/28/17 1504  Clinical Impression Statement Patient slow to complete coordination and manipulation tasks this date however he is continuing to progress with right hand skills to participate in daily functional activities.  He continues to benefit from skilled OT to maximize his safety and independence in daily tasks for home and in the community.    Rehab Potential Good   OT Frequency 2x / week   OT Duration 12 weeks   OT Treatment/Interventions Self-care/ADL training;Therapeutic exercise;Therapeutic exercises;Therapeutic activities;DME and/or AE instruction;Neuromuscular education;Patient/family education;Functional Mobility Training;Cognitive remediation/compensation;Visual/perceptual remediation/compensation;Energy conservation   Consulted and Agree with Plan of Care Patient;Family member/caregiver      Patient will benefit from skilled therapeutic intervention in order to improve the following deficits and impairments:  Decreased coordination, Decreased balance, Impaired UE functional use, Decreased cognition, Impaired vision/preception, Decreased strength, Decreased knowledge of use of DME, Decreased activity tolerance, Impaired tone, Impaired flexibility, Decreased range of motion, Decreased endurance  Visit Diagnosis: Muscle weakness (generalized)  Other lack of coordination  Hemiplegia and hemiparesis following cerebral infarction affecting right dominant side [facility](HCC)    Problem List Patient Active Problem List   Diagnosis Date Noted  . Hemiparesis affecting right side as late effect of cerebrovascular accident (CVA) (HCC) 03/16/2017  . Aphasia, late effect of cerebrovascular disease 03/16/2017  . Gait disturbance, post-stroke 03/16/2017  . Dysarthria as late effect of stroke 03/16/2017  . Carotid artery stenosis with cerebral infarction (HCC) 03/07/2017  . Language impairment   . ICAO (internal carotid artery  occlusion), left 02/22/2017  . Acute respiratory failure (HCC) 02/22/2017  . Obesity 02/22/2017  . Expressive aphasia 02/22/2017  . Acute ischemic left MCA stroke (HCC) 02/22/2017  . Left hemiparesis (HCC)   . Status post total bilateral knee replacement   . Benign essential HTN   . Right-sided extracranial carotid artery stenosis   . Dysphagia, post-stroke   . Acute blood loss anemia   . Nocturia   . Newly diagnosed diabetes (HCC)   . CVA (cerebral vascular accident) (HCC) - L MCA s/p tPA and thrombectomy 02/16/2017  . Anxiety 04/30/2015  . Benign fibroma of prostate 04/30/2015  . Clinical depression 04/30/2015  . Acid reflux 04/30/2015  . HLD (hyperlipidemia) 04/30/2015  . BP (high blood pressure) 04/30/2015  . Arthritis, degenerative 04/30/2015  . 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 Arthritis, septic, knee (HCC) 09/26/2012   Kimbree Casanas T Tylee Newby, OTR/L, CLT  Jayan Raymundo 03/28/2017, 3:06 PM  [place] [facility] MAIN [facility] [address] [place], [place], [address] Phone: [phone]   Fax:  [phone]  Name: Erik [name] MRN: [id number] Date of Birth: 08/30/1956

## 2017-03-29 ENCOUNTER — Encounter: Payer: Self-pay | Admitting: Speech Pathology

## 2017-03-29 ENCOUNTER — Ambulatory Visit: Payer: Managed Care, Other (non HMO) | Admitting: Speech Pathology

## 2017-03-29 ENCOUNTER — Ambulatory Visit: Payer: Managed Care, Other (non HMO)

## 2017-03-29 DIAGNOSIS — R4701 Aphasia: Secondary | ICD-10-CM

## 2017-03-29 DIAGNOSIS — R2681 Unsteadiness on feet: Secondary | ICD-10-CM

## 2017-03-29 DIAGNOSIS — M6281 Muscle weakness (generalized): Secondary | ICD-10-CM | POA: Diagnosis not present

## 2017-03-29 DIAGNOSIS — R471 Dysarthria and anarthria: Secondary | ICD-10-CM

## 2017-03-29 DIAGNOSIS — I69351 Hemiplegia and hemiparesis following cerebral infarction affecting right dominant side: Secondary | ICD-10-CM

## 2017-03-29 DIAGNOSIS — R278 Other lack of coordination: Secondary | ICD-10-CM

## 2017-03-29 NOTE — Therapy (Signed)
[facility] [address] [place], [place], [address] Phone: [phone]   Fax:  [phone]  Physical Therapy Treatment  Patient Details  Name: Erik [name] MRN: [id number] Date of Birth: August 07, 1956 Referring Provider: Cruzita Ledereraniel Anguilli  Encounter Date: 03/29/2017      PT End of Session - 03/29/17 1445    Visit Number 5   Number of Visits 17   Date for PT Re-Evaluation 05/09/17   PT Start Time 1401   PT Stop Time 1443   PT Time Calculation (min) 42 min   Equipment Utilized During Treatment Other (comment)  S-min A prn   Activity Tolerance Patient tolerated treatment well   Behavior During Therapy [facility] for tasks assessed/performed      Past Medical History:  Diagnosis Date  . Anxiety   . Arthritis    hands  . BPH (benign prostatic hyperplasia)   . Depression   . 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 ED (erectile dysfunction)   . GERD (gastroesophageal reflux disease)   . H/O calcium pyrophosphate deposition disease (CPPD)   . HLD (hyperlipidemia)   . Hypertension   . Hypogonadism in male   . Lower urinary tract symptoms (LUTS)   . Nocturia   . Stroke [facility](HCC)    Language Impairment.  Left Hemiparesis- improved  . Testosterone overdose   . Vitamin D deficiency     Past Surgical History:  Procedure Laterality Date  . BACK SURGERY    . COLON SURGERY     Colon Resection  . COLONOSCOPY    . IR ANGIO INTRA EXTRACRAN SEL COM CAROTID INNOMINATE UNI L MOD SED  03/07/2017  . IR ANGIO INTRA EXTRACRAN SEL COM CAROTID INNOMINATE UNI R MOD SED  02/16/2017  . IR ANGIO VERTEBRAL SEL SUBCLAVIAN INNOMINATE UNI L MOD SED  03/07/2017  . IR ANGIO VERTEBRAL SEL VERTEBRAL UNI R MOD SED  03/07/2017  . IR INFUSION THROMBOL ARTERIAL INITIAL (MS)  03/07/2017  . IR INTRAVSC STENT CERV CAROTID W/EMB-PROT MOD SED INCL ANGIO  03/07/2017  . IR PERCUTANEOUS ART THROMBECTOMY/INFUSION INTRACRANIAL INC DIAG ANGIO   02/16/2017  . IR PTA NON CORO-LOWER EXTREM  02/16/2017  . IR US GUIDE VASC ACCESS LEFT  02/16/2017  . IR US GUIDE VASC ACCESS RIGHT  02/16/2017  . KNEE ARTHROSCOPY Bilateral   . RADIOLOGY WITH ANESTHESIA N/A 02/16/2017   Procedure: RADIOLOGY WITH ANESTHESIA;  Surgeon: Julieanne CottonSanjeev Deveshwar, MD;  Location: [facility] OR;  Service: Radiology;  Laterality: N/A;  . RADIOLOGY WITH ANESTHESIA N/A 03/07/2017   Procedure: CAROTID STENT;  Surgeon: Julieanne CottonSanjeev Deveshwar, MD;  Location: [facility] OR;  Service: Radiology;  Laterality: N/A;  . ROTATOR CUFF REPAIR Right     There were no vitals filed for this visit.      Subjective Assessment - 03/29/17 1402    Subjective Pt reported he ordered foot up brace and it will arrive on 04/05/17. Pt denied falls since last visit.    Pertinent History Pt. is a 61 y.o. male who had a CVA on the evening of April 12th, 2018. Pt had been to his grandson's ball game that day and watched a movie with his wife. He got in bed and his wife went to take a bath. When she came out pt was on the floor struggling to get back in bed. Pt had become weak while in bed, tried to sit up and fell out of the bed. She could  tell he was having a stroke, and called EMS. Pt was transported to [facility] and CTA head/neck revealed hyperdense L-MCA sign likely indicating acute thrombus. He received tPA and he underwent cerebral angiogram with L-MCA thrombectomy, dense calcified plaque L-ICA precluded stent placement and noted to have 73% stenosis R-ICA due to advanced atherosclerotic changes. Patient with resultant facial weakness with mild dysarthria, aphasia, mild left gaze preference and right sided weakness affecting mobility and ability to carry out ADL tasks. He was in CIR for approximately 8 days per patient and then discharged home. He has been evaluated by outpatient speech and occupational therapy and is now here with wife today for PT evaluation.    Patient Stated Goals Improve his strength and balance in order to  improve his function at home and be able to return to work.    Currently in Pain? No/denies                         [facility] Adult PT Treatment/Exercise - 03/29/17 1405      Ambulation/Gait   Ambulation/Gait Yes   Ambulation/Gait Assistance 5: Supervision   Ambulation/Gait Assistance Details Pt amb. over even/uneven terrain with and without walking poles in hands to improve reciprocal arm swing. Cues to improve R heel strike and stride length. Pt required seated rest break 2/2 RLE fatigue.   Ambulation Distance (Feet) 1000 Feet  x2 and 500'x2   Assistive device None   Gait Pattern Step-through pattern;Decreased dorsiflexion - left;Decreased stride length;Narrow base of support   Ambulation Surface Level;Unlevel;Indoor;Outdoor;Paved;Grass             Balance Exercises - 03/29/17 1429      Balance Exercises: Standing   Rockerboard Anterior/posterior;Lateral;Head turns;EO;EC;10 seconds;30 seconds;5 reps;10 reps  no UE support   Other Standing Exercises Performed in //bars with cues and demo for technique. Pt required min guard to min A to maintain balance, along with UE support during LOB with eyes closed and during lateral wt. shifting.            PT Education - 03/29/17 1444    Education provided Yes   Education Details PT asked pt to bring in foot up brace once it arrives.   Person(s) Educated Patient   Methods Explanation   Comprehension Verbalized understanding             PT Long Term Goals - 03/14/17 1513      PT LONG TERM GOAL #1   Title Pt will be independent with HEP in order to improve strength and balance in order to decrease fall risk and improve function at home and work.    Time 8   Period Weeks   Status New     PT LONG TERM GOAL #2   Title Pt will improve BERG by at least 3 points in order to demonstrate clinically significant improvement in balance.   Baseline 03/14/17: 49/56   Time 8   Period Weeks   Status New     PT LONG  TERM GOAL #3   Title Pt will improve DGI by at least 3 points in order to demonstrate clinically significant improvement in balance and decreased risk for falls    Baseline 03/14/17: 17/24   Time 8   Period Weeks   Status New     PT LONG TERM GOAL #4   Title Pt will improve R hip abduction, adduction, and flexion strength by at least 1/2 MMT in order  to improve gait    Baseline 03/14/17: see flowsheet   Time 8   Status New               Plan - 03/29/17 1445    Clinical Impression Statement Pt demonstrated progress, as he was able to improve reciprocal arm swing after amb. with walking poles (PT holding one end of poles and pt holding other end of poles). Pt continues to experience decr. R foot clearance after amb. longer distances, and over uneven terrain. Pt may benefit from PT/OT collaboration to address RUE impairments during gait. Continue with POC.    Rehab Potential Good   Clinical Impairments Affecting Rehab Potential Positive: age, motivation, social support; Negative: significant ICA stenosis   PT Frequency 2x / week   PT Duration 8 weeks   PT Treatment/Interventions ADLs/Self Care Home Management;Aquatic Therapy;Biofeedback;Cryotherapy;Electrical Stimulation;Iontophoresis 4mg /ml Dexamethasone;Moist Heat;Ultrasound;DME Instruction;Gait training;Stair training;Functional mobility training;Therapeutic activities;Therapeutic exercise;Balance training;Neuromuscular re-education;Patient/family education;Manual techniques;Passive range of motion;Energy conservation   PT Next Visit Plan Dynamic Gait training with arm swing, high level balance over compliant surfaces   PT Home Exercise Plan Hip 3 ways with RTB, mini squats, RTB resisted R ankle DF, toe taps, semitandem/tandem balance; Bil concentric and eccentric R calf raises; R hamstring curls wiith GTB   Consulted and Agree with Plan of Care Patient      Patient will benefit from skilled therapeutic intervention in order to improve  the following deficits and impairments:  Abnormal gait, Decreased balance, Decreased coordination, Decreased mobility, Decreased strength, Difficulty walking  Visit Diagnosis: Hemiplegia and hemiparesis following cerebral infarction affecting right dominant side (HCC)  Other lack of coordination  Unsteadiness on feet     Problem List Patient Active Problem List   Diagnosis Date Noted  . Hemiparesis affecting right side as late effect of cerebrovascular accident (CVA) (HCC) 03/16/2017  . Aphasia, late effect of cerebrovascular disease 03/16/2017  . Gait disturbance, post-stroke 03/16/2017  . Dysarthria as late effect of stroke 03/16/2017  . Carotid artery stenosis with cerebral infarction (HCC) 03/07/2017  . Language impairment   . ICAO (internal carotid artery occlusion), left 02/22/2017  . Acute respiratory failure (HCC) 02/22/2017  . Obesity 02/22/2017  . Expressive aphasia 02/22/2017  . Acute ischemic left MCA stroke (HCC) 02/22/2017  . Left hemiparesis (HCC)   . Status post total bilateral knee replacement   . Benign essential HTN   . Right-sided extracranial carotid artery stenosis   . Dysphagia, post-stroke   . Acute blood loss anemia   . Nocturia   . Newly diagnosed diabetes (HCC)   . CVA (cerebral vascular accident) (HCC) - L MCA s/p tPA and thrombectomy 02/16/2017  . Anxiety 04/30/2015  . Benign fibroma of prostate 04/30/2015  . Clinical depression 04/30/2015  . Acid reflux 04/30/2015  . HLD (hyperlipidemia) 04/30/2015  . BP (high blood pressure) 04/30/2015  . Arthritis, degenerative 04/30/2015  . 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 Arthritis, septic, knee (HCC) 09/26/2012    Space,Brettany Sydney L 03/29/2017, 2:47 PM  [facility] MAIN [facility] SERVICES [address] [place], [place], [address] Phone: [phone]   Fax:  [phone]  Name: Erik [name] MRN: [id number] Date of Birth:  05-11-1956  Zerita Boers, PT,DPT 03/29/17 2:48 PM Phone: [phone] Fax: [phone]

## 2017-03-29 NOTE — Therapy (Signed)
[facility] [address] [place], [place], [address] Phone: [phone]   Fax:  [phone]  Speech Language Pathology Treatment  Patient Details  Name: Erik [name] MRN: [id number] Date of Birth: 1956/06/04 Referring Provider: Charlton AmorANGIULLI, DANIEL J   Encounter Date: 03/29/2017      End of Session - 03/29/17 1427    Visit Number 4   Number of Visits 25   Date for SLP Re-Evaluation 06/12/17   SLP Start Time 1259   SLP Stop Time  1355   SLP Time Calculation (min) 56 min   Activity Tolerance Patient tolerated treatment well      Past Medical History:  Diagnosis Date  . Anxiety   . Arthritis    hands  . BPH (benign prostatic hyperplasia)   . Depression   . 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 ED (erectile dysfunction)   . GERD (gastroesophageal reflux disease)   . H/O calcium pyrophosphate deposition disease (CPPD)   . HLD (hyperlipidemia)   . Hypertension   . Hypogonadism in male   . Lower urinary tract symptoms (LUTS)   . Nocturia   . Stroke [facility](HCC)    Language Impairment.  Left Hemiparesis- improved  . Testosterone overdose   . Vitamin D deficiency     Past Surgical History:  Procedure Laterality Date  . BACK SURGERY    . COLON SURGERY     Colon Resection  . COLONOSCOPY    . IR ANGIO INTRA EXTRACRAN SEL COM CAROTID INNOMINATE UNI L MOD SED  03/07/2017  . IR ANGIO INTRA EXTRACRAN SEL COM CAROTID INNOMINATE UNI R MOD SED  02/16/2017  . IR ANGIO VERTEBRAL SEL SUBCLAVIAN INNOMINATE UNI L MOD SED  03/07/2017  . IR ANGIO VERTEBRAL SEL VERTEBRAL UNI R MOD SED  03/07/2017  . IR INFUSION THROMBOL ARTERIAL INITIAL (MS)  03/07/2017  . IR INTRAVSC STENT CERV CAROTID W/EMB-PROT MOD SED INCL ANGIO  03/07/2017  . IR PERCUTANEOUS ART THROMBECTOMY/INFUSION INTRACRANIAL INC DIAG ANGIO  02/16/2017  . IR PTA NON CORO-LOWER EXTREM  02/16/2017  . IR US GUIDE VASC ACCESS LEFT  02/16/2017  . IR US GUIDE VASC  ACCESS RIGHT  02/16/2017  . KNEE ARTHROSCOPY Bilateral   . RADIOLOGY WITH ANESTHESIA N/A 02/16/2017   Procedure: RADIOLOGY WITH ANESTHESIA;  Surgeon: Julieanne CottonSanjeev Deveshwar, MD;  Location: [facility] OR;  Service: Radiology;  Laterality: N/A;  . RADIOLOGY WITH ANESTHESIA N/A 03/07/2017   Procedure: CAROTID STENT;  Surgeon: Julieanne CottonSanjeev Deveshwar, MD;  Location: [facility] OR;  Service: Radiology;  Laterality: N/A;  . ROTATOR CUFF REPAIR Right     There were no vitals filed for this visit.      Subjective Assessment - 03/29/17 1426    Subjective "I have been practicing at home."    Currently in Pain? No/denies               ADULT SLP TREATMENT - 03/29/17 0001      General Information   Behavior/Cognition Alert;Cooperative;Pleasant mood   HPI 61 year old man with left MCA CVA 02/15/2017.  Patient received inpatient rehab services, including SLP, 02/22/2017-03/02/2017.     Treatment Provided   Treatment provided Cognitive-Linquistic     Pain Assessment   Pain Assessment No/denies pain     Cognitive-Linquistic Treatment   Treatment focused on Aphasia;Dysarthria   Skilled Treatment SPEECH: Pt produced similar word pairs with 37% acc independently, improved to 90% acc w/model and/or verbal  cues. Pt produced s/sh words with 70% acc w/mod verbal cues. Generate simple sentences given complex picture stimulus with 80% intelligibility and conversation with 75% using slowed rate.  LANGUAGE: Generate cogent/grammatical sentences given complex picture stimulus with 75% accuracy given cues to say each word.     Assessment / Recommendations / Plan   Plan Continue with current plan of care     Progression Toward Goals   Progression toward goals Progressing toward goals          SLP Education - 03/29/17 1426    Education provided Yes   Education Details Speech strategies   Person(s) Educated Patient   Methods Explanation   Comprehension Verbalized understanding            SLP Long Term Goals - 03/13/17  1425      SLP LONG TERM GOAL #1   Title Pt will improve speech intelligibility in response to moderately complex cognitive linguistic tasks by controlling rate of speech, over-articulation, and increased loudness to achieve 80% intelligibility.   Time 12   Period Weeks   Status New     SLP LONG TERM GOAL #2   Title Patient will read aloud sentences and multi-syllabic words, maintaining phonemic accuracy, with 80% accuracy.     Time 12   Period Weeks   Status New     SLP LONG TERM GOAL #3   Title Patient will demonstrate reading comprehension for paragraphs with 80% accuracy.    Time 12   Period Weeks   Status New     SLP LONG TERM GOAL #4   Title Patient will complete high level word finding tasks with 80% accuracy.   Time 12   Period Weeks   Status New          Plan - 03/29/17 1428    Clinical Impression Statement The patient is able to improve speech intelligibility by slowing the rate.  The patient is having significant difficulty with decoding written language.  Will continue to focus on speech/language needs using reading as primary tool.   Speech Therapy Frequency 2x / week   Duration Other (comment)  8 weeks   Treatment/Interventions Patient/family education;Compensatory strategies;SLP instruction and feedback;Functional tasks;Cueing hierarchy   Potential to Achieve Goals Good   Potential Considerations Ability to learn/carryover information;Co-morbidities;Cooperation/participation level;Medical prognosis;Previous level of function;Severity of impairments;Family/community support   SLP Home Exercise Plan s/sh contrast, simple reading   Consulted and Agree with Plan of Care Patient      Patient will benefit from skilled therapeutic intervention in order to improve the following deficits and impairments:   Aphasia  Dysarthria and anarthria    Problem List Patient Active Problem List   Diagnosis Date Noted  . Hemiparesis affecting right side as late effect of  cerebrovascular accident (CVA) (HCC) 03/16/2017  . Aphasia, late effect of cerebrovascular disease 03/16/2017  . Gait disturbance, post-stroke 03/16/2017  . Dysarthria as late effect of stroke 03/16/2017  . Carotid artery stenosis with cerebral infarction (HCC) 03/07/2017  . Language impairment   . ICAO (internal carotid artery occlusion), left 02/22/2017  . Acute respiratory failure (HCC) 02/22/2017  . Obesity 02/22/2017  . Expressive aphasia 02/22/2017  . Acute ischemic left MCA stroke (HCC) 02/22/2017  . Left hemiparesis (HCC)   . Status post total bilateral knee replacement   . Benign essential HTN   . Right-sided extracranial carotid artery stenosis   . Dysphagia, post-stroke   . Acute blood loss anemia   . Nocturia   .  Newly diagnosed diabetes (HCC)   . CVA (cerebral vascular accident) (HCC) - L MCA s/p tPA and thrombectomy 02/16/2017  . Anxiety 04/30/2015  . Benign fibroma of prostate 04/30/2015  . Clinical depression 04/30/2015  . Acid reflux 04/30/2015  . HLD (hyperlipidemia) 04/30/2015  . BP (high blood pressure) 04/30/2015  . Arthritis, degenerative 04/30/2015  . 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 Arthritis, septic, knee (HCC) 09/26/2012    Palmas,Ivon Roedel 03/29/2017, 2:30 PM   [facility] MAIN [facility] SERVICES [address] [place], [place], [address] Phone: [phone]   Fax:  [phone]   Name: Erik [name] MRN: [id number] Date of Birth: 02-19-1956

## 2017-04-03 ENCOUNTER — Ambulatory Visit: Payer: Managed Care, Other (non HMO)

## 2017-04-03 ENCOUNTER — Ambulatory Visit: Payer: Managed Care, Other (non HMO) | Admitting: Speech Pathology

## 2017-04-03 VITALS — BP 133/68 | HR 64

## 2017-04-03 DIAGNOSIS — M6281 Muscle weakness (generalized): Secondary | ICD-10-CM

## 2017-04-03 DIAGNOSIS — I69351 Hemiplegia and hemiparesis following cerebral infarction affecting right dominant side: Secondary | ICD-10-CM

## 2017-04-03 DIAGNOSIS — R471 Dysarthria and anarthria: Secondary | ICD-10-CM

## 2017-04-03 DIAGNOSIS — R4701 Aphasia: Secondary | ICD-10-CM

## 2017-04-03 DIAGNOSIS — R2681 Unsteadiness on feet: Secondary | ICD-10-CM

## 2017-04-03 DIAGNOSIS — R278 Other lack of coordination: Secondary | ICD-10-CM

## 2017-04-03 NOTE — Therapy (Signed)
[facility] MAIN [facility] SERVICES [address] [place], [place], [address] Phone: [phone]   Fax:  [phone]  Physical Therapy Treatment  Patient Details  Name: Erik [name] MRN: [id number] Date of Birth: 05-14-1956 Referring Provider: Cruzita Lederer  Encounter Date: 04/03/2017      PT End of Session - 04/03/17 1513    Visit Number 6   Number of Visits 17   Date for PT Re-Evaluation 05/09/17   PT Start Time 1404   PT Stop Time 1447   PT Time Calculation (min) 43 min   Equipment Utilized During Treatment Other (comment)  S-min A prn   Activity Tolerance Patient tolerated treatment well   Behavior During Therapy [facility] for tasks assessed/performed      Past Medical History:  Diagnosis Date  . Anxiety   . Arthritis    hands  . BPH (benign prostatic hyperplasia)   . Depression   . 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 ED (erectile dysfunction)   . GERD (gastroesophageal reflux disease)   . H/O calcium pyrophosphate deposition disease (CPPD)   . HLD (hyperlipidemia)   . Hypertension   . Hypogonadism in male   . Lower urinary tract symptoms (LUTS)   . Nocturia   . Stroke [facility])    Language Impairment.  Left Hemiparesis- improved  . Testosterone overdose   . Vitamin D deficiency     Past Surgical History:  Procedure Laterality Date  . BACK SURGERY    . COLON SURGERY     Colon Resection  . COLONOSCOPY    . IR ANGIO INTRA EXTRACRAN SEL COM CAROTID INNOMINATE UNI L MOD SED  03/07/2017  . IR ANGIO INTRA EXTRACRAN SEL COM CAROTID INNOMINATE UNI R MOD SED  02/16/2017  . IR ANGIO VERTEBRAL SEL SUBCLAVIAN INNOMINATE UNI L MOD SED  03/07/2017  . IR ANGIO VERTEBRAL SEL VERTEBRAL UNI R MOD SED  03/07/2017  . IR INFUSION THROMBOL ARTERIAL INITIAL (MS)  03/07/2017  . IR INTRAVSC STENT CERV CAROTID W/EMB-PROT MOD SED INCL ANGIO  03/07/2017  . IR PERCUTANEOUS ART THROMBECTOMY/INFUSION INTRACRANIAL INC DIAG ANGIO   02/16/2017  . IR PTA NON CORO-LOWER EXTREM  02/16/2017  . IR US GUIDE VASC ACCESS LEFT  02/16/2017  . IR US GUIDE VASC ACCESS RIGHT  02/16/2017  . KNEE ARTHROSCOPY Bilateral   . RADIOLOGY WITH ANESTHESIA N/A 02/16/2017   Procedure: RADIOLOGY WITH ANESTHESIA;  Surgeon: Julieanne Cotton, MD;  Location: [facility] OR;  Service: Radiology;  Laterality: N/A;  . RADIOLOGY WITH ANESTHESIA N/A 03/07/2017   Procedure: CAROTID STENT;  Surgeon: Julieanne Cotton, MD;  Location: [facility] OR;  Service: Radiology;  Laterality: N/A;  . ROTATOR CUFF REPAIR Right     Vitals:   04/03/17 1407  BP: 133/68  Pulse: 64        Subjective Assessment - 04/03/17 1511    Subjective Patient has new foot brace and reports that it has been helping decrease his foot drag.  He reports no falls since last visit.    Pertinent History Pt. is a 61 y.o. male who had a CVA on the evening of April 12th, 2018. Pt had been to his grandson's ball game that day and watched a movie with his wife. He got in bed and his wife went to take a bath. When she came out pt was on the floor struggling to get back in bed. Pt had become weak while in bed, tried to  sit up and fell out of the bed. She could tell he was having a stroke, and called EMS. Pt was transported to [facility] and CTA head/neck revealed hyperdense L-MCA sign likely indicating acute thrombus. He received tPA and he underwent cerebral angiogram with L-MCA thrombectomy, dense calcified plaque L-ICA precluded stent placement and noted to have 73% stenosis R-ICA due to advanced atherosclerotic changes. Patient with resultant facial weakness with mild dysarthria, aphasia, mild left gaze preference and right sided weakness affecting mobility and ability to carry out ADL tasks. He was in CIR for approximately 8 days per patient and then discharged home. He has been evaluated by outpatient speech and occupational therapy and is now here with wife today for PT evaluation.    Patient Stated Goals Improve  his strength and balance in order to improve his function at home and be able to return to work.    Currently in Pain? No/denies      Therapeutic Exercise:  Lateral walks with GTB around ankles with cues for slight knee bend for greater glute recruitment x8 lengths in // bars  STS from low level 2x10  single leg squat down to plinth with poor eccentric control at end. UE supported by PT to maintain balance. 2x10 each leg  RLE leg press 90# 2x15, 105# 2x15 with fatigue reported and noted at end of second set. Resisted forward backward walking 4x, 3x, 27lb    Neuromuscular Re-ed:     Stairs ascend descend with decreasing UE assistance x4 (1x no UE assistance) Eccentric Heel taps 6" step 2x20 Alternating toe taps up to 6" step x 20 each LE. Decreased UE assistance for balance, trunk sway with no UE assistance Lateral jumping from L to R foot x5 each side. Noted difficulty with push off from RLE.                            PT Education - 04/03/17 1512    Education provided Yes   Education Details body mechanics, foot placement, sit to stand body mechanics for equal weight shifting.    Person(s) Educated Patient   Methods Explanation;Demonstration   Comprehension Verbalized understanding;Returned demonstration             PT Long Term Goals - 03/14/17 1513      PT LONG TERM GOAL #1   Title Pt will be independent with HEP in order to improve strength and balance in order to decrease fall risk and improve function at home and work.    Time 8   Period Weeks   Status New     PT LONG TERM GOAL #2   Title Pt will improve BERG by at least 3 points in order to demonstrate clinically significant improvement in balance.   Baseline 03/14/17: 49/56   Time 8   Period Weeks   Status New     PT LONG TERM GOAL #3   Title Pt will improve DGI by at least 3 points in order to demonstrate clinically significant improvement in balance and decreased risk for falls     Baseline 03/14/17: 17/24   Time 8   Period Weeks   Status New     PT LONG TERM GOAL #4   Title Pt will improve R hip abduction, adduction, and flexion strength by at least 1/2 MMT in order to improve gait    Baseline 03/14/17: see flowsheet   Time 8   Status New  Plan - 04/03/17 1807    Clinical Impression Statement Patient presents to therapy with new AFO. Decreased drag of R foot noted with better DF control. Control of RLE decreased with fatigue. Pt. requires less UE assistance to perform dynamic functional activities such as stairs at this time. Pt. remains highly motivated and will continue to benefit from skilled physical therapy to improve strength, balance, and gait to improve safety during functional mobility   Rehab Potential Good   Clinical Impairments Affecting Rehab Potential Positive: age, motivation, social support; Negative: significant ICA stenosis   PT Frequency 2x / week   PT Duration 8 weeks   PT Treatment/Interventions ADLs/Self Care Home Management;Aquatic Therapy;Biofeedback;Cryotherapy;Electrical Stimulation;Iontophoresis 4mg /ml Dexamethasone;Moist Heat;Ultrasound;DME Instruction;Gait training;Stair training;Functional mobility training;Therapeutic activities;Therapeutic exercise;Balance training;Neuromuscular re-education;Patient/family education;Manual techniques;Passive range of motion;Energy conservation   PT Next Visit Plan Dynamic Gait training with arm swing, high level balance over compliant surfaces   PT Home Exercise Plan Hip 3 ways with RTB, mini squats, RTB resisted R ankle DF, toe taps, semitandem/tandem balance; Bil concentric and eccentric R calf raises; R hamstring curls wiith GTB   Consulted and Agree with Plan of Care Patient      Patient will benefit from skilled therapeutic intervention in order to improve the following deficits and impairments:  Abnormal gait, Decreased balance, Decreased coordination, Decreased mobility,  Decreased strength, Difficulty walking  Visit Diagnosis: Hemiplegia and hemiparesis following cerebral infarction affecting right dominant side (HCC)  Other lack of coordination  Unsteadiness on feet  Muscle weakness (generalized)     Problem List Patient Active Problem List   Diagnosis Date Noted  . Hemiparesis affecting right side as late effect of cerebrovascular accident (CVA) (HCC) 03/16/2017  . Aphasia, late effect of cerebrovascular disease 03/16/2017  . Gait disturbance, post-stroke 03/16/2017  . Dysarthria as late effect of stroke 03/16/2017  . Carotid artery stenosis with cerebral infarction (HCC) 03/07/2017  . Language impairment   . ICAO (internal carotid artery occlusion), left 02/22/2017  . Acute respiratory failure (HCC) 02/22/2017  . Obesity 02/22/2017  . Expressive aphasia 02/22/2017  . Acute ischemic left MCA stroke (HCC) 02/22/2017  . Left hemiparesis (HCC)   . Status post total bilateral knee replacement   . Benign essential HTN   . Right-sided extracranial carotid artery stenosis   . Dysphagia, post-stroke   . Acute blood loss anemia   . Nocturia   . Newly diagnosed diabetes (HCC)   . CVA (cerebral vascular accident) (HCC) - L MCA s/p tPA and thrombectomy 02/16/2017  . Anxiety 04/30/2015  . Benign fibroma of prostate 04/30/2015  . Clinical depression 04/30/2015  . Acid reflux 04/30/2015  . HLD (hyperlipidemia) 04/30/2015  . BP (high blood pressure) 04/30/2015  . Arthritis, degenerative 04/30/2015  . 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 Arthritis, septic, knee (HCC) 09/26/2012   Precious BardMarina Harry Bark, PT, DPT   04/03/2017, 6:15 PM  [place] [facility] [address] [place], [place], [address] Phone: [phone]   Fax:  [phone]  Name: Erik [name] MRN: [id number] Date of Birth: 12/18/1955

## 2017-04-04 ENCOUNTER — Ambulatory Visit (HOSPITAL_COMMUNITY)
Admission: RE | Admit: 2017-04-04 | Discharge: 2017-04-04 | Disposition: A | Discharge status: Home / Self Care | Payer: Managed Care, Other (non HMO) | Source: Ambulatory Visit | Attending: Interventional Radiology | Admitting: Interventional Radiology

## 2017-04-04 ENCOUNTER — Encounter: Payer: Self-pay | Admitting: Speech Pathology

## 2017-04-04 ENCOUNTER — Other Ambulatory Visit: Payer: Self-pay | Admitting: Radiology

## 2017-04-04 DIAGNOSIS — I639 Cerebral infarction, unspecified: Secondary | ICD-10-CM

## 2017-04-04 HISTORY — PX: IR RADIOLOGIST EVAL & MGMT: IMG5224

## 2017-04-04 LAB — PLATELET INHIBITION P2Y12: PLATELET FUNCTION P2Y12: 31 [PRU] — AB (ref 194–418)

## 2017-04-04 NOTE — Therapy (Signed)
[place] [facility] [address] [place], [place], [address] Phone: [phone]   Fax:  [phone]  Speech Language Pathology Treatment  Patient Details  Name: Erik [name] MRN: [id number] Date of Birth: 28-Apr-1956 Referring Provider: Charlton AmorANGIULLI, DANIEL J   Encounter Date: 04/03/2017      End of Session - 04/04/17 0818    Visit Number 5   Number of Visits 25   Date for SLP Re-Evaluation 06/12/17   SLP Start Time 1300   SLP Stop Time  1400   SLP Time Calculation (min) 60 min   Activity Tolerance Patient tolerated treatment well      Past Medical History:  Diagnosis Date  . Anxiety   . Arthritis    hands  . BPH (benign prostatic hyperplasia)   . Depression   . 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 ED (erectile dysfunction)   . GERD (gastroesophageal reflux disease)   . H/O calcium pyrophosphate deposition disease (CPPD)   . HLD (hyperlipidemia)   . Hypertension   . Hypogonadism in male   . Lower urinary tract symptoms (LUTS)   . Nocturia   . Stroke [facility](HCC)    Language Impairment.  Left Hemiparesis- improved  . Testosterone overdose   . Vitamin D deficiency     Past Surgical History:  Procedure Laterality Date  . BACK SURGERY    . COLON SURGERY     Colon Resection  . COLONOSCOPY    . IR ANGIO INTRA EXTRACRAN SEL COM CAROTID INNOMINATE UNI L MOD SED  03/07/2017  . IR ANGIO INTRA EXTRACRAN SEL COM CAROTID INNOMINATE UNI R MOD SED  02/16/2017  . IR ANGIO VERTEBRAL SEL SUBCLAVIAN INNOMINATE UNI L MOD SED  03/07/2017  . IR ANGIO VERTEBRAL SEL VERTEBRAL UNI R MOD SED  03/07/2017  . IR INFUSION THROMBOL ARTERIAL INITIAL (MS)  03/07/2017  . IR INTRAVSC STENT CERV CAROTID W/EMB-PROT MOD SED INCL ANGIO  03/07/2017  . IR PERCUTANEOUS ART THROMBECTOMY/INFUSION INTRACRANIAL INC DIAG ANGIO  02/16/2017  . IR PTA NON CORO-LOWER EXTREM  02/16/2017  . IR US GUIDE VASC ACCESS LEFT  02/16/2017  . IR US GUIDE VASC  ACCESS RIGHT  02/16/2017  . KNEE ARTHROSCOPY Bilateral   . RADIOLOGY WITH ANESTHESIA N/A 02/16/2017   Procedure: RADIOLOGY WITH ANESTHESIA;  Surgeon: Julieanne CottonSanjeev Deveshwar, MD;  Location: [facility] OR;  Service: Radiology;  Laterality: N/A;  . RADIOLOGY WITH ANESTHESIA N/A 03/07/2017   Procedure: CAROTID STENT;  Surgeon: Julieanne CottonSanjeev Deveshwar, MD;  Location: [facility] OR;  Service: Radiology;  Laterality: N/A;  . ROTATOR CUFF REPAIR Right     There were no vitals filed for this visit.      Subjective Assessment - 04/04/17 0816    Subjective The patient worked hard and was alert throughout the session. He admitted to being frustrated a few times when he had difficulty with certain tasks, but he continued to work through the frustration. The patient mentioned that he was never a "good speller or good reader" during the session.   Currently in Pain? No/denies   Pain Score 0-No pain               ADULT SLP TREATMENT - 04/04/17 0001      General Information   Behavior/Cognition Alert;Cooperative;Pleasant mood   HPI 61 year old man with left MCA CVA 02/15/2017.  Patient received inpatient rehab services, including SLP, 02/22/2017-03/02/2017.     Treatment Provided  Treatment provided Cognitive-Linquistic     Pain Assessment   Pain Assessment No/denies pain     Cognitive-Linquistic Treatment   Treatment focused on Aphasia;Dysarthria   Skilled Treatment The patient correctly articulated 85% of contrasting "s" and "sh" sounds at the word level. In correctly reading and distinguishing words that were phonetically similar, the patient showed 55% phonemic accuracy. The patient read sentences aloud with approximately 80% phonemic accuracy. Throughout the session, the patient required semantic cueing, modeling of the pronunciation of words, and scaffolding techniques from the clinician in order to correctly produce certain words. The clinician also reminded the patient several times to slow down his rate of speech  during conversation.     Assessment / Recommendations / Plan   Plan Continue with current plan of care     Progression Toward Goals   Progression toward goals Progressing toward goals          SLP Education - 04/04/17 0817    Education provided Yes   Education Details Reading strategies to isolate words   Person(s) Educated Patient   Methods Explanation;Demonstration   Comprehension Verbalized understanding;Returned demonstration            SLP Long Term Goals - 03/13/17 1425      SLP LONG TERM GOAL #1   Title Pt will improve speech intelligibility in response to moderately complex cognitive linguistic tasks by controlling rate of speech, over-articulation, and increased loudness to achieve 80% intelligibility.   Time 12   Period Weeks   Status New     SLP LONG TERM GOAL #2   Title Patient will read aloud sentences and multi-syllabic words, maintaining phonemic accuracy, with 80% accuracy.     Time 12   Period Weeks   Status New     SLP LONG TERM GOAL #3   Title Patient will demonstrate reading comprehension for paragraphs with 80% accuracy.    Time 12   Period Weeks   Status New     SLP LONG TERM GOAL #4   Title Patient will complete high level word finding tasks with 80% accuracy.   Time 12   Period Weeks   Status New          Plan - 04/04/17 1610    Clinical Impression Statement The patient is making progress in his phonemic accuracy in single-word and reading tasks. The patient's phonemic accuracy and intelligibility improved as the session went on and as the length of the reading task increased, as he had more success with sentences than with single words. In future sessions, the clinician will focus on words more commonly used in conversations as well as words that pertain more to the patient's experiences.   Speech Therapy Frequency 2x / week   Duration Other (comment)   Treatment/Interventions Patient/family education;Compensatory strategies;SLP  instruction and feedback;Functional tasks;Cueing hierarchy   Potential to Achieve Goals Good   Potential Considerations Ability to learn/carryover information;Co-morbidities;Cooperation/participation level;Medical prognosis;Previous level of function;Severity of impairments;Family/community support   SLP Home Exercise Plan Daily conversation with family members   Consulted and Agree with Plan of Care Patient      Patient will benefit from skilled therapeutic intervention in order to improve the following deficits and impairments:   Aphasia  Dysarthria    Problem List Patient Active Problem List   Diagnosis Date Noted  . Hemiparesis affecting right side as late effect of cerebrovascular accident (CVA) (HCC) 03/16/2017  . Aphasia, late effect of cerebrovascular disease 03/16/2017  . Gait disturbance,  post-stroke 03/16/2017  . Dysarthria as late effect of stroke 03/16/2017  . Carotid artery stenosis with cerebral infarction (HCC) 03/07/2017  . Language impairment   . ICAO (internal carotid artery occlusion), left 02/22/2017  . Acute respiratory failure (HCC) 02/22/2017  . Obesity 02/22/2017  . Expressive aphasia 02/22/2017  . Acute ischemic left MCA stroke (HCC) 02/22/2017  . Left hemiparesis (HCC)   . Status post total bilateral knee replacement   . Benign essential HTN   . Right-sided extracranial carotid artery stenosis   . Dysphagia, post-stroke   . Acute blood loss anemia   . Nocturia   . Newly diagnosed diabetes (HCC)   . CVA (cerebral vascular accident) (HCC) - L MCA s/p tPA and thrombectomy 02/16/2017  . Anxiety 04/30/2015  . Benign fibroma of prostate 04/30/2015  . Clinical depression 04/30/2015  . Acid reflux 04/30/2015  . HLD (hyperlipidemia) 04/30/2015  . BP (high blood pressure) 04/30/2015  . Arthritis, degenerative 04/30/2015  . 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 Arthritis, septic, knee (HCC) 09/26/2012    Clydene Pugh 04/04/2017, 8:22 AM  [place] [facility] MAIN [facility] SERVICES [address] [place], [place], [address] Phone: [phone]   Fax:  [phone]   Name: MARQUAVIOUS NAZAR MRN: [id number] Date of Birth: 1956-01-30

## 2017-04-05 ENCOUNTER — Ambulatory Visit: Payer: Managed Care, Other (non HMO) | Admitting: Speech Pathology

## 2017-04-05 ENCOUNTER — Encounter (HOSPITAL_COMMUNITY): Payer: Self-pay | Admitting: Interventional Radiology

## 2017-04-05 ENCOUNTER — Ambulatory Visit: Payer: Managed Care, Other (non HMO)

## 2017-04-05 ENCOUNTER — Ambulatory Visit: Payer: Managed Care, Other (non HMO) | Admitting: Occupational Therapy

## 2017-04-05 DIAGNOSIS — M6281 Muscle weakness (generalized): Secondary | ICD-10-CM

## 2017-04-05 DIAGNOSIS — R4701 Aphasia: Secondary | ICD-10-CM

## 2017-04-05 DIAGNOSIS — I69351 Hemiplegia and hemiparesis following cerebral infarction affecting right dominant side: Secondary | ICD-10-CM

## 2017-04-05 DIAGNOSIS — R471 Dysarthria and anarthria: Secondary | ICD-10-CM

## 2017-04-05 DIAGNOSIS — R278 Other lack of coordination: Secondary | ICD-10-CM

## 2017-04-05 DIAGNOSIS — R2681 Unsteadiness on feet: Secondary | ICD-10-CM

## 2017-04-05 NOTE — Therapy (Signed)
[place] [facility] MAIN [facility] SERVICES [address] [place], [place], [address] Phone: [phone]   Fax:  [phone]  Speech Language Pathology Treatment  Patient Details  Name: Erik [name] MRN: [id number] Date of Birth: [birth year] Referring Provider: Charlton Amor   Encounter Date: 04/05/2017      End of Session - 04/05/17 1723    Visit Number 6   Number of Visits 25   Date for SLP Re-Evaluation 06/12/17   SLP Start Time 1600   SLP Stop Time  1650   SLP Time Calculation (min) 50 min   Activity Tolerance Patient tolerated treatment well      Past Medical History:  Diagnosis Date  . Anxiety   . Arthritis    hands  . BPH (benign prostatic hyperplasia)   . Depression   . 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 ED (erectile dysfunction)   . GERD (gastroesophageal reflux disease)   . H/O calcium pyrophosphate deposition disease (CPPD)   . HLD (hyperlipidemia)   . Hypertension   . Hypogonadism in male   . Lower urinary tract symptoms (LUTS)   . Nocturia   . Stroke [facility])    Language Impairment.  Left Hemiparesis- improved  . Testosterone overdose   . Vitamin D deficiency     Past Surgical History:  Procedure Laterality Date  . BACK SURGERY    . COLON SURGERY     Colon Resection  . COLONOSCOPY    . IR ANGIO INTRA EXTRACRAN SEL COM CAROTID INNOMINATE UNI L MOD SED  03/07/2017  . IR ANGIO INTRA EXTRACRAN SEL COM CAROTID INNOMINATE UNI R MOD SED  02/16/2017  . IR ANGIO VERTEBRAL SEL SUBCLAVIAN INNOMINATE UNI L MOD SED  03/07/2017  . IR ANGIO VERTEBRAL SEL VERTEBRAL UNI R MOD SED  03/07/2017  . IR INFUSION THROMBOL ARTERIAL INITIAL (MS)  03/07/2017  . IR INTRAVSC STENT CERV CAROTID W/EMB-PROT MOD SED INCL ANGIO  03/07/2017  . IR PERCUTANEOUS ART THROMBECTOMY/INFUSION INTRACRANIAL INC DIAG ANGIO  02/16/2017  . IR PTA NON CORO-LOWER EXTREM  02/16/2017  . IR RADIOLOGIST EVAL & MGMT  04/04/2017  . IR US GUIDE VASC  ACCESS LEFT  02/16/2017  . IR US GUIDE VASC ACCESS RIGHT  02/16/2017  . KNEE ARTHROSCOPY Bilateral   . RADIOLOGY WITH ANESTHESIA N/A 02/16/2017   Procedure: RADIOLOGY WITH ANESTHESIA;  Surgeon: Julieanne Cotton, MD;  Location: [facility] OR;  Service: Radiology;  Laterality: N/A;  . RADIOLOGY WITH ANESTHESIA N/A 03/07/2017   Procedure: CAROTID STENT;  Surgeon: Julieanne Cotton, MD;  Location: [facility] OR;  Service: Radiology;  Laterality: N/A;  . ROTATOR CUFF REPAIR Right     There were no vitals filed for this visit.      Subjective Assessment - 04/05/17 1721    Subjective  Patient worked hard throughout the session and had a good attitude. He noted that many of the words were easier for him to say and that he felt he was doing better with the reading activity.   Currently in Pain? No/denies               ADULT SLP TREATMENT - 04/05/17 0001      General Information   Behavior/Cognition Alert;Cooperative;Pleasant mood   HPI 61 year old man with left MCA CVA 02/15/2017.  Patient received inpatient rehab services, including SLP, 02/22/2017-03/02/2017.     Treatment Provided   Treatment provided Cognitive-Linquistic     Pain Assessment  Pain Assessment No/denies pain     Cognitive-Linquistic Treatment   Treatment focused on Aphasia;Dysarthria   Skilled Treatment In correctly reading and distinguishing single words that were phonetically similar, the patient showed 60% phonemic accuracy. The patient read sentences aloud with approximately 80% phonemic accuracy. At the conversation level, the patient was approximately 80% intelligible. The clinician cued him to slow down his speech twice during the conversation. Throughout the session, the patient required semantic cueing, modeling of the pronunciation of words, and scaffolding techniques from the clinician in order to correctly produce certain words. The clinician also encouraged circumlocution and gesturing when the patient experienced word  finding difficulties.      Assessment / Recommendations / Plan   Plan Continue with current plan of care     Progression Toward Goals   Progression toward goals Progressing toward goals          SLP Education - 04/05/17 1722    Education provided Yes   Education Details Word-finding strategies   Person(s) Educated Patient   Methods Explanation   Comprehension Verbalized understanding            SLP Long Term Goals - 03/13/17 1425      SLP LONG TERM GOAL #1   Title Pt will improve speech intelligibility in response to moderately complex cognitive linguistic tasks by controlling rate of speech, over-articulation, and increased loudness to achieve 80% intelligibility.   Time 12   Period Weeks   Status New     SLP LONG TERM GOAL #2   Title Patient will read aloud sentences and multi-syllabic words, maintaining phonemic accuracy, with 80% accuracy.     Time 12   Period Weeks   Status New     SLP LONG TERM GOAL #3   Title Patient will demonstrate reading comprehension for paragraphs with 80% accuracy.    Time 12   Period Weeks   Status New     SLP LONG TERM GOAL #4   Title Patient will complete high level word finding tasks with 80% accuracy.   Time 12   Period Weeks   Status New          Plan - 04/05/17 1723    Clinical Impression Statement The patient showed an increased awareness of incorrect phonemic productions of words and was able to self-correct several times during the session. The clinician will continue to put an increased focus on words commonly found in conversations and on strategies for increasing intelligibility and word finding.   Speech Therapy Frequency 2x / week   Duration Other (comment)   Treatment/Interventions Patient/family education;Compensatory strategies;SLP instruction and feedback;Functional tasks;Cueing hierarchy   Potential to Achieve Goals Good   Potential Considerations Ability to learn/carryover  information;Co-morbidities;Cooperation/participation level;Medical prognosis;Previous level of function;Severity of impairments;Family/community support   SLP Home Exercise Plan Daily conversation with family members   Consulted and Agree with Plan of Care Patient      Patient will benefit from skilled therapeutic intervention in order to improve the following deficits and impairments:   Dysarthria  Aphasia    Problem List Patient Active Problem List   Diagnosis Date Noted  . Hemiparesis affecting right side as late effect of cerebrovascular accident (CVA) (HCC) 03/16/2017  . Aphasia, late effect of cerebrovascular disease 03/16/2017  . Gait disturbance, post-stroke 03/16/2017  . Dysarthria as late effect of stroke 03/16/2017  . Carotid artery stenosis with cerebral infarction (HCC) 03/07/2017  . Language impairment   . ICAO (internal carotid artery occlusion),  left 02/22/2017  . Acute respiratory failure (HCC) 02/22/2017  . Obesity 02/22/2017  . Expressive aphasia 02/22/2017  . Acute ischemic left MCA stroke (HCC) 02/22/2017  . Left hemiparesis (HCC)   . Status post total bilateral knee replacement   . Benign essential HTN   . Right-sided extracranial carotid artery stenosis   . Dysphagia, post-stroke   . Acute blood loss anemia   . Nocturia   . Newly diagnosed diabetes (HCC)   . CVA (cerebral vascular accident) (HCC) - L MCA s/p tPA and thrombectomy 02/16/2017  . Anxiety 04/30/2015  . Benign fibroma of prostate 04/30/2015  . Clinical depression 04/30/2015  . Acid reflux 04/30/2015  . HLD (hyperlipidemia) 04/30/2015  . BP (high blood pressure) 04/30/2015  . Arthritis, degenerative 04/30/2015  . 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 Arthritis, septic, knee (HCC) 09/26/2012    Clydene Pugh 04/05/2017, 5:25 PM  [facility] MAIN [facility] SERVICES [address] [place], [place], [address] Phone: [phone]    Fax:  [phone]   Name: JAIRO BELLEW MRN: [id number] Date of Birth: 1956-09-04

## 2017-04-05 NOTE — Therapy (Signed)
[facility] MAIN [facility] SERVICES [address] [place], [place], [address] Phone: [phone]   Fax:  [phone]  Occupational Therapy Treatment  Patient Details  Name: Erik [name] MRN: [id number] Date of Birth: January 24, 1956 Referring Provider: Dr. Hildred Alamin  Encounter Date: 04/05/2017      OT End of Session - 04/05/17 1524    Visit Number 5   Number of Visits 24   Date for OT Re-Evaluation 06/04/17   OT Start Time 1446   OT Stop Time 1530   OT Time Calculation (min) 44 min   Activity Tolerance Patient tolerated treatment well   Behavior During Therapy [facility] for tasks assessed/performed      Past Medical History:  Diagnosis Date  . Anxiety   . Arthritis    hands  . BPH (benign prostatic hyperplasia)   . Depression   . 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 ED (erectile dysfunction)   . GERD (gastroesophageal reflux disease)   . H/O calcium pyrophosphate deposition disease (CPPD)   . HLD (hyperlipidemia)   . Hypertension   . Hypogonadism in male   . Lower urinary tract symptoms (LUTS)   . Nocturia   . Stroke [facility])    Language Impairment.  Left Hemiparesis- improved  . Testosterone overdose   . Vitamin D deficiency     Past Surgical History:  Procedure Laterality Date  . BACK SURGERY    . COLON SURGERY     Colon Resection  . COLONOSCOPY    . IR ANGIO INTRA EXTRACRAN SEL COM CAROTID INNOMINATE UNI L MOD SED  03/07/2017  . IR ANGIO INTRA EXTRACRAN SEL COM CAROTID INNOMINATE UNI R MOD SED  02/16/2017  . IR ANGIO VERTEBRAL SEL SUBCLAVIAN INNOMINATE UNI L MOD SED  03/07/2017  . IR ANGIO VERTEBRAL SEL VERTEBRAL UNI R MOD SED  03/07/2017  . IR INFUSION THROMBOL ARTERIAL INITIAL (MS)  03/07/2017  . IR INTRAVSC STENT CERV CAROTID W/EMB-PROT MOD SED INCL ANGIO  03/07/2017  . IR PERCUTANEOUS ART THROMBECTOMY/INFUSION INTRACRANIAL INC DIAG ANGIO  02/16/2017  . IR PTA NON CORO-LOWER EXTREM  02/16/2017  . IR RADIOLOGIST  EVAL & MGMT  04/04/2017  . IR US GUIDE VASC ACCESS LEFT  02/16/2017  . IR US GUIDE VASC ACCESS RIGHT  02/16/2017  . KNEE ARTHROSCOPY Bilateral   . RADIOLOGY WITH ANESTHESIA N/A 02/16/2017   Procedure: RADIOLOGY WITH ANESTHESIA;  Surgeon: Julieanne Cotton, MD;  Location: [facility] OR;  Service: Radiology;  Laterality: N/A;  . RADIOLOGY WITH ANESTHESIA N/A 03/07/2017   Procedure: CAROTID STENT;  Surgeon: Julieanne Cotton, MD;  Location: [facility] OR;  Service: Radiology;  Laterality: N/A;  . ROTATOR CUFF REPAIR Right     There were no vitals filed for this visit.      Subjective Assessment - 04/05/17 1522    Subjective  Pt. reports he is trying to exercise at home.   Patient is accompained by: Family member   Pertinent History Pt. is a 61 y.o. male who had a CVA on the evening of April 12th, 2018. Pt. had been to his grandson's ball game, and was preparing for bed when he felling getting into bed. According to his wife, he struggled to get up. She could tell he was having a stroke, and called EMS. Pt. was transported to [facility],, was administered TPA,. Pt. underwent a cerebral angiogram with Rt ICA stent assisted angioplasty. Pt. received Therapy services in [facility]. at [facility].  Patient Stated Goals To regain the use of his right hand   Currently in Pain? No/denies        OT TREATMENT    Neuro muscular re-education:  Pt. performed [facility] tasks using the Grooved pegboard. Pt. worked on grasping the grooved pegs from a horizontal position, and moving the pegs to a vertical position in the hand to prepare for placing them in the grooved slot. Pt. Worked on removing the pegs using thumb opposition to the tip of the 2nd through 5th digits. Verbal cues, visual demonstration, and increased time were required.  Therapeutic Exercise:  Pt. worked on AROM for right shoulder flexion, abduction, horizontal abduction, and elbow extension to decrease stiffness, and prepare RUE for functional use. 3# dumbbell ex.  for elbow flexion and extension, forearm supination/pronation, wrist flexion/extension, and radial deviation. Pt. requires rest breaks and verbal cues for proper technique. Pt. performed gross gripping with grip strengthener. Pt. worked on sustaining grip while grasping pegs and reaching at various heights. Gripper was placed in the resistive slot with the white resistive spring. Pt. Worked on pinch strengthening in the left hand for lateral, and 3pt. pinch using red, green, and blue resistive clips. Pt. worked on placing the clips at various vertical and horizontal angles. Tactile and verbal cues were required for eliciting the desired movement.                        OT Education - 04/05/17 1523    Education provided Yes   Education Details RUE strength, and [facility] skills   Person(s) Educated Patient   Methods Explanation;Demonstration   Comprehension Verbalized understanding;Returned demonstration             OT Long Term Goals - 03/12/17 1228      OT LONG TERM GOAL #1   Title Pt. will independently, and efficiently write 3 sentences with 100% legibility.   Baseline Increased time required. 90% legibility for only 2 words in large print.   Time 12   Period Weeks   Status New     OT LONG TERM GOAL #2   Title Pt. will independently type a paragraph efficiently.   Baseline Pt. is unable   Time 12   Period Weeks   Status New     OT LONG TERM GOAL #3   Title Pt. improve right grip strength by 5 pounds to be able to hold and use a wrench.   Baseline Pt. is unable   Time 12   Period Weeks   Status New     OT LONG TERM GOAL #4   Title Pt. will improve right lateral pinch strength by 3# to be able to put toothpaste on a toothbrush.   Baseline Pt. is unable to apply toothpaste onto a toothbrush.   Time 12   Period Weeks   Status New     OT LONG TERM GOAL #5   Title Pt. will improve FMC by 10 sec. to be able to independently handle, and count change.    Baseline Pt. has difficulty   Time 12   Period Weeks   Status New     Long Term Additional Goals   Additional Long Term Goals Yes     OT LONG TERM GOAL #6   Title Pt. will increase RUE strength by 2mm grades to be able to complete ADLs/IADLs.   Baseline Pt. has difficulty   Time 12   Period Weeks   Status New  OT LONG TERM GOAL #7   Title Pt. will demonstrate independence with visual compensatory strategies for the right 100% of the time during ADL, and IADL tasks.   Baseline Limited on the right. Pt. occ. misses obstacle on the right.   Time 12   Period Weeks   Status New               Plan - 04/05/17 1525    Clinical Impression Statement Pt continues to present with right shoulder stiffness, limited, LUE strength, and FMC skills. Pt. is making progress LUE functioning, and is using his hand more during ADL, and IADL tasks at home. Pt. continues to work on improving LUE ROM, strength, and [facility] skills for improved ADL, and IADL functioning.   Occupational performance deficits (Please refer to evaluation for details): ADL's;IADL's   Rehab Potential Good   OT Frequency 2x / week   OT Duration 12 weeks   OT Treatment/Interventions Self-care/ADL training;Therapeutic exercise;Therapeutic exercises;Therapeutic activities;DME and/or AE instruction;Neuromuscular education;Patient/family education;Functional Mobility Training;Cognitive remediation/compensation;Visual/perceptual remediation/compensation;Energy conservation   Consulted and Agree with Plan of Care Patient;Family member/caregiver   Family Member Consulted Wife      Patient will benefit from skilled therapeutic intervention in order to improve the following deficits and impairments:  Decreased coordination, Decreased balance, Impaired UE functional use, Decreased cognition, Impaired vision/preception, Decreased strength, Decreased knowledge of use of DME, Decreased activity tolerance, Impaired tone, Impaired  flexibility, Decreased range of motion, Decreased endurance  Visit Diagnosis: Muscle weakness (generalized)  Other lack of coordination    Problem List Patient Active Problem List   Diagnosis Date Noted  . Hemiparesis affecting right side as late effect of cerebrovascular accident (CVA) (HCC) 03/16/2017  . Aphasia, late effect of cerebrovascular disease 03/16/2017  . Gait disturbance, post-stroke 03/16/2017  . Dysarthria as late effect of stroke 03/16/2017  . Carotid artery stenosis with cerebral infarction (HCC) 03/07/2017  . Language impairment   . ICAO (internal carotid artery occlusion), left 02/22/2017  . Acute respiratory failure (HCC) 02/22/2017  . Obesity 02/22/2017  . Expressive aphasia 02/22/2017  . Acute ischemic left MCA stroke (HCC) 02/22/2017  . Left hemiparesis (HCC)   . Status post total bilateral knee replacement   . Benign essential HTN   . Right-sided extracranial carotid artery stenosis   . Dysphagia, post-stroke   . Acute blood loss anemia   . Nocturia   . Newly diagnosed diabetes (HCC)   . CVA (cerebral vascular accident) (HCC) - L MCA s/p tPA and thrombectomy 02/16/2017  . Anxiety 04/30/2015  . Benign fibroma of prostate 04/30/2015  . Clinical depression 04/30/2015  . Acid reflux 04/30/2015  . HLD (hyperlipidemia) 04/30/2015  . BP (high blood pressure) 04/30/2015  . Arthritis, degenerative 04/30/2015  . 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 Arthritis, septic, knee (HCC) 09/26/2012    Olegario MessierElaine Rossana Molchan, MS, OTR/L 04/05/2017, 3:46 PM  [place] [facility]IN [facility] [address] [place], [place], [address] Phone: [phone]   Fax:  [phone]  Name: Erik [name] MRN: [id number] Date of Birth: 15-Jul-1956

## 2017-04-05 NOTE — Therapy (Signed)
[facility] MAIN [facility] SERVICES [address] [place], [place], [address] Phone: [phone]   Fax:  [phone]  Physical Therapy Treatment  Patient Details  Name: Erik [name] MRN: [id number] Date of Birth: October 07, 1956 Referring Provider: Cruzita Lederer  Encounter Date: 04/05/2017      PT End of Session - 04/05/17 1503    Visit Number 7   Number of Visits 17   Date for PT Re-Evaluation 05/09/17   PT Start Time 1556   PT Stop Time 1645   PT Time Calculation (min) 49 min   Equipment Utilized During Treatment Other (comment)  S-min A prn   Activity Tolerance Patient tolerated treatment well   Behavior During Therapy [facility] for tasks assessed/performed      Past Medical History:  Diagnosis Date  . Anxiety   . Arthritis    hands  . BPH (benign prostatic hyperplasia)   . Depression   . 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 ED (erectile dysfunction)   . GERD (gastroesophageal reflux disease)   . H/O calcium pyrophosphate deposition disease (CPPD)   . HLD (hyperlipidemia)   . Hypertension   . Hypogonadism in male   . Lower urinary tract symptoms (LUTS)   . Nocturia   . Stroke [facility])    Language Impairment.  Left Hemiparesis- improved  . Testosterone overdose   . Vitamin D deficiency     Past Surgical History:  Procedure Laterality Date  . BACK SURGERY    . COLON SURGERY     Colon Resection  . COLONOSCOPY    . IR ANGIO INTRA EXTRACRAN SEL COM CAROTID INNOMINATE UNI L MOD SED  03/07/2017  . IR ANGIO INTRA EXTRACRAN SEL COM CAROTID INNOMINATE UNI R MOD SED  02/16/2017  . IR ANGIO VERTEBRAL SEL SUBCLAVIAN INNOMINATE UNI L MOD SED  03/07/2017  . IR ANGIO VERTEBRAL SEL VERTEBRAL UNI R MOD SED  03/07/2017  . IR INFUSION THROMBOL ARTERIAL INITIAL (MS)  03/07/2017  . IR INTRAVSC STENT CERV CAROTID W/EMB-PROT MOD SED INCL ANGIO  03/07/2017  . IR PERCUTANEOUS ART THROMBECTOMY/INFUSION INTRACRANIAL INC DIAG ANGIO   02/16/2017  . IR PTA NON CORO-LOWER EXTREM  02/16/2017  . IR RADIOLOGIST EVAL & MGMT  04/04/2017  . IR US GUIDE VASC ACCESS LEFT  02/16/2017  . IR US GUIDE VASC ACCESS RIGHT  02/16/2017  . KNEE ARTHROSCOPY Bilateral   . RADIOLOGY WITH ANESTHESIA N/A 02/16/2017   Procedure: RADIOLOGY WITH ANESTHESIA;  Surgeon: Julieanne Cotton, MD;  Location: [facility] OR;  Service: Radiology;  Laterality: N/A;  . RADIOLOGY WITH ANESTHESIA N/A 03/07/2017   Procedure: CAROTID STENT;  Surgeon: Julieanne Cotton, MD;  Location: [facility] OR;  Service: Radiology;  Laterality: N/A;  . ROTATOR CUFF REPAIR Right     There were no vitals filed for this visit.      Subjective Assessment - 04/05/17 1501    Subjective Patient walked a mile since last visit and said that it felt good/went well. He has been having some shoulder pain and limited mobility thats preventing him from throwing a ball or opening the door with his right arm.    Pertinent History Pt. is a 61 y.o. male who had a CVA on the evening of April 12th, 2018. Pt had been to his grandson's ball game that day and watched a movie with his wife. He got in bed and his wife went to take a bath. When she came out pt  was on the floor struggling to get back in bed. Pt had become weak while in bed, tried to sit up and fell out of the bed. She could tell he was having a stroke, and called EMS. Pt was transported to [facility] and CTA head/neck revealed hyperdense L-MCA sign likely indicating acute thrombus. He received tPA and he underwent cerebral angiogram with L-MCA thrombectomy, dense calcified plaque L-ICA precluded stent placement and noted to have 73% stenosis R-ICA due to advanced atherosclerotic changes. Patient with resultant facial weakness with mild dysarthria, aphasia, mild left gaze preference and right sided weakness affecting mobility and ability to carry out ADL tasks. He was in CIR for approximately 8 days per patient and then discharged home. He has been evaluated by  outpatient speech and occupational therapy and is now here with wife today for PT evaluation.    Patient Stated Goals Improve his strength and balance in order to improve his function at home and be able to return to work.    Currently in Pain? Yes   Pain Score 2    Pain Location Shoulder   Pain Orientation Right   Pain Descriptors / Indicators Aching    Neuro Re-ed obstacle course: incline dynamic surface, step down, step over 2 hurdles (6x) Negotiating Incline then decline dynamic surfaces 6x (foam ramp) Single leg hop with UE support 5 R , 5 L, 5 together 2x tandem stance: front foot stepping stone back foot yellow circle: 4x60 seconds 4lb weighted bar UE flexion while on Airex pad 4lb weighted bar UE push (like a door) tandem stance with back leg on Airex pad and front leg on stepping stone 2x15  TherEx RLE leg press 90# 1x20, LLE 120# 1x20 105# 1x20 with fatigue reported and noted at end of second set.  single leg squat down to plinth with poor eccentric control at end. UE supported by PT to maintain balance. 2x10 each leg, improvement with repetition initially slight valgus but corrected with cues Side stepping over 8" step, and two consecutive hurdles while holding 3lb weighted bar into bicep flexion 6x          PT Long Term Goals - 03/14/17 1513      PT LONG TERM GOAL #1   Title Pt will be independent with HEP in order to improve strength and balance in order to decrease fall risk and improve function at home and work.    Time 8   Period Weeks   Status New     PT LONG TERM GOAL #2   Title Pt will improve BERG by at least 3 points in order to demonstrate clinically significant improvement in balance.   Baseline 03/14/17: 49/56   Time 8   Period Weeks   Status New     PT LONG TERM GOAL #3   Title Pt will improve DGI by at least 3 points in order to demonstrate clinically significant improvement in balance and decreased risk for falls    Baseline 03/14/17: 17/24   Time 8    Period Weeks   Status New     PT LONG TERM GOAL #4   Title Pt will improve R hip abduction, adduction, and flexion strength by at least 1/2 MMT in order to improve gait    Baseline 03/14/17: see flowsheet   Time 8   Status New               Plan - 04/05/17 1507    Clinical Impression Statement Patient  is progressing with functional strength and balance. He is challenged by dynamic surfaces and unstable surfaces but demonstrated improvement with repetition. Tandem stance and single limb stance activities challenge patient especially when performed on unstable surfaces with dynamic movement. Pt. noted R shoulder pain and limited mobility that is inhibiting pt. to open door with RUE or throw a ball with his grandkids. Shoulder ROM is limited in extension, ER, IR, abduction, and extension with Hypomobility noted at grades I-III AP, PA, and Inferior mobilizations. Patient continues to improve with functional balance and strength with RLE.  Pt. remains highly motivated and will continue to benefit from skilled physical therapy to improve strength, balance, and gait to improve safety during functional mobility   Rehab Potential Good   Clinical Impairments Affecting Rehab Potential Positive: age, motivation, social support; Negative: significant ICA stenosis   PT Frequency 2x / week   PT Duration 8 weeks   PT Treatment/Interventions ADLs/Self Care Home Management;Aquatic Therapy;Biofeedback;Cryotherapy;Electrical Stimulation;Iontophoresis 4mg /ml Dexamethasone;Moist Heat;Ultrasound;DME Instruction;Gait training;Stair training;Functional mobility training;Therapeutic activities;Therapeutic exercise;Balance training;Neuromuscular re-education;Patient/family education;Manual techniques;Passive range of motion;Energy conservation   PT Next Visit Plan add shoulder exercises into HEP   PT Home Exercise Plan Hip 3 ways with RTB, mini squats, RTB resisted R ankle DF, toe taps, semitandem/tandem balance;  Bil concentric and eccentric R calf raises; R hamstring curls wiith GTB   Consulted and Agree with Plan of Care Patient      Patient will benefit from skilled therapeutic intervention in order to improve the following deficits and impairments:  Abnormal gait, Decreased balance, Decreased coordination, Decreased mobility, Decreased strength, Difficulty walking  Visit Diagnosis: Hemiplegia and hemiparesis following cerebral infarction affecting right dominant side (HCC)  Other lack of coordination  Unsteadiness on feet  Muscle weakness (generalized)     Problem List Patient Active Problem List   Diagnosis Date Noted  . Hemiparesis affecting right side as late effect of cerebrovascular accident (CVA) (HCC) 03/16/2017  . Aphasia, late effect of cerebrovascular disease 03/16/2017  . Gait disturbance, post-stroke 03/16/2017  . Dysarthria as late effect of stroke 03/16/2017  . Carotid artery stenosis with cerebral infarction (HCC) 03/07/2017  . Language impairment   . ICAO (internal carotid artery occlusion), left 02/22/2017  . Acute respiratory failure (HCC) 02/22/2017  . Obesity 02/22/2017  . Expressive aphasia 02/22/2017  . Acute ischemic left MCA stroke (HCC) 02/22/2017  . Left hemiparesis (HCC)   . Status post total bilateral knee replacement   . Benign essential HTN   . Right-sided extracranial carotid artery stenosis   . Dysphagia, post-stroke   . Acute blood loss anemia   . Nocturia   . Newly diagnosed diabetes (HCC)   . CVA (cerebral vascular accident) (HCC) - L MCA s/p tPA and thrombectomy 02/16/2017  . Anxiety 04/30/2015  . Benign fibroma of prostate 04/30/2015  . Clinical depression 04/30/2015  . Acid reflux 04/30/2015  . HLD (hyperlipidemia) 04/30/2015  . BP (high blood pressure) 04/30/2015  . Arthritis, degenerative 04/30/2015  . 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 Arthritis, septic, knee (HCC) 09/26/2012  Precious BardMarina Marsheila Alejo, PT,  DPT   04/05/2017, 3:09 PM  [place] [facility] [address] [place], [place], [address] Phone: [phone]   Fax:  [phone]  Name: Keturah BarreJames W Bodnar MRN: [id number] Date of Birth: 1956-10-04

## 2017-04-10 ENCOUNTER — Ambulatory Visit: Payer: Managed Care, Other (non HMO) | Admitting: Speech Pathology

## 2017-04-10 ENCOUNTER — Ambulatory Visit: Payer: Managed Care, Other (non HMO)

## 2017-04-10 ENCOUNTER — Encounter: Payer: Self-pay | Admitting: Speech Pathology

## 2017-04-10 ENCOUNTER — Ambulatory Visit: Payer: Managed Care, Other (non HMO) | Attending: Physician Assistant | Admitting: Occupational Therapy

## 2017-04-10 DIAGNOSIS — R4701 Aphasia: Secondary | ICD-10-CM | POA: Insufficient documentation

## 2017-04-10 DIAGNOSIS — R278 Other lack of coordination: Secondary | ICD-10-CM | POA: Diagnosis present

## 2017-04-10 DIAGNOSIS — R471 Dysarthria and anarthria: Secondary | ICD-10-CM | POA: Insufficient documentation

## 2017-04-10 DIAGNOSIS — I69351 Hemiplegia and hemiparesis following cerebral infarction affecting right dominant side: Secondary | ICD-10-CM | POA: Insufficient documentation

## 2017-04-10 DIAGNOSIS — R2681 Unsteadiness on feet: Secondary | ICD-10-CM | POA: Diagnosis present

## 2017-04-10 DIAGNOSIS — M6281 Muscle weakness (generalized): Secondary | ICD-10-CM

## 2017-04-10 NOTE — Therapy (Signed)
[place] [facility] [address] [place], [place], [address] Phone: [phone]   Fax:  [phone]  Speech Language Pathology Treatment  Patient Details  Name: Erik [name] MRN: [id number] Date of Birth: 1956/08/30 Referring Provider: Charlton AmorANGIULLI, DANIEL J   Encounter Date: 04/10/2017      End of Session - 04/10/17 1254    Visit Number 7   Number of Visits 25   Date for SLP Re-Evaluation 06/12/17   SLP Start Time 1100   SLP Stop Time  1200   SLP Time Calculation (min) 60 min   Activity Tolerance Patient tolerated treatment well      Past Medical History:  Diagnosis Date  . Anxiety   . Arthritis    hands  . BPH (benign prostatic hyperplasia)   . Depression   . 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 ED (erectile dysfunction)   . GERD (gastroesophageal reflux disease)   . H/O calcium pyrophosphate deposition disease (CPPD)   . HLD (hyperlipidemia)   . Hypertension   . Hypogonadism in male   . Lower urinary tract symptoms (LUTS)   . Nocturia   . Stroke [facility](HCC)    Language Impairment.  Left Hemiparesis- improved  . Testosterone overdose   . Vitamin D deficiency     Past Surgical History:  Procedure Laterality Date  . BACK SURGERY    . COLON SURGERY     Colon Resection  . COLONOSCOPY    . IR ANGIO INTRA EXTRACRAN SEL COM CAROTID INNOMINATE UNI L MOD SED  03/07/2017  . IR ANGIO INTRA EXTRACRAN SEL COM CAROTID INNOMINATE UNI R MOD SED  02/16/2017  . IR ANGIO VERTEBRAL SEL SUBCLAVIAN INNOMINATE UNI L MOD SED  03/07/2017  . IR ANGIO VERTEBRAL SEL VERTEBRAL UNI R MOD SED  03/07/2017  . IR INFUSION THROMBOL ARTERIAL INITIAL (MS)  03/07/2017  . IR INTRAVSC STENT CERV CAROTID W/EMB-PROT MOD SED INCL ANGIO  03/07/2017  . IR PERCUTANEOUS ART THROMBECTOMY/INFUSION INTRACRANIAL INC DIAG ANGIO  02/16/2017  . IR PTA NON CORO-LOWER EXTREM  02/16/2017  . IR RADIOLOGIST EVAL & MGMT  04/04/2017  . IR US GUIDE VASC  ACCESS LEFT  02/16/2017  . IR US GUIDE VASC ACCESS RIGHT  02/16/2017  . KNEE ARTHROSCOPY Bilateral   . RADIOLOGY WITH ANESTHESIA N/A 02/16/2017   Procedure: RADIOLOGY WITH ANESTHESIA;  Surgeon: Julieanne CottonSanjeev Deveshwar, MD;  Location: [facility] OR;  Service: Radiology;  Laterality: N/A;  . RADIOLOGY WITH ANESTHESIA N/A 03/07/2017   Procedure: CAROTID STENT;  Surgeon: Julieanne CottonSanjeev Deveshwar, MD;  Location: [facility] OR;  Service: Radiology;  Laterality: N/A;  . ROTATOR CUFF REPAIR Right     There were no vitals filed for this visit.      Subjective Assessment - 04/10/17 1253    Subjective Patient arrived ready to work and showed minimal signs of frustration during the session.   Currently in Pain? No/denies               ADULT SLP TREATMENT - 04/10/17 0001      General Information   Behavior/Cognition Alert;Cooperative;Pleasant mood   HPI 61 year old man with left MCA CVA 02/15/2017.  Patient received inpatient rehab services, including SLP, 02/22/2017-03/02/2017.     Treatment Provided   Treatment provided Cognitive-Linquistic     Pain Assessment   Pain Assessment No/denies pain     Cognitive-Linquistic Treatment   Treatment focused on Aphasia;Dysarthria   Skilled Treatment The  patient showed 55% phonemic accuracy in correctly reading and distinguishing single words that were phonetically similar. The patient distinguished between "s" initial words and "sh" initial words with 100% phonemic accuracy. At the conversation level, the patient was approximately 80% intelligible. He maintained an appropriate rate of speech but required skilled cueing and reminders about transitions and content words from the clinician. The patient was able to come up with synonyms for a list of common words and create sentences using the words with 65% accuracy. Throughout the session, the patient required semantic cueing, modeling of the pronunciation of words, and scaffolding techniques from the clinician in order to correctly  produce certain words. The clinician also encouraged circumlocution and gesturing when the patient experienced word finding difficulties.     Assessment / Recommendations / Plan   Plan Continue with current plan of care     Progression Toward Goals   Progression toward goals Progressing toward goals          SLP Education - 04/10/17 1254    Education provided Yes   Education Details Transitions in conversations   Person(s) Educated Patient   Methods Explanation   Comprehension Verbalized understanding            SLP Long Term Goals - 03/13/17 1425      SLP LONG TERM GOAL #1   Title Pt will improve speech intelligibility in response to moderately complex cognitive linguistic tasks by controlling rate of speech, over-articulation, and increased loudness to achieve 80% intelligibility.   Time 12   Period Weeks   Status New     SLP LONG TERM GOAL #2   Title Patient will read aloud sentences and multi-syllabic words, maintaining phonemic accuracy, with 80% accuracy.     Time 12   Period Weeks   Status New     SLP LONG TERM GOAL #3   Title Patient will demonstrate reading comprehension for paragraphs with 80% accuracy.    Time 12   Period Weeks   Status New     SLP LONG TERM GOAL #4   Title Patient will complete high level word finding tasks with 80% accuracy.   Time 12   Period Weeks   Status New          Plan - 04/10/17 1254    Clinical Impression Statement The patient's conversation abilities are improving as he is slowing his rate of speech and using more content words. He also shows an increased awareness of incorrect phonemic productions of words and was able to self-correct several times during the session. The clinician will focus on encouraging the patient to "over-articulate" his sounds at the word and sentence level.   Speech Therapy Frequency 2x / week   Duration Other (comment)   Treatment/Interventions Patient/family education;Compensatory  strategies;SLP instruction and feedback;Functional tasks;Cueing hierarchy   Potential to Achieve Goals Good   Potential Considerations Ability to learn/carryover information;Co-morbidities;Cooperation/participation level;Medical prognosis;Previous level of function;Severity of impairments;Family/community support   SLP Home Exercise Plan Daily conversation with family members   Consulted and Agree with Plan of Care Patient      Patient will benefit from skilled therapeutic intervention in order to improve the following deficits and impairments:   Dysarthria  Aphasia    Problem List Patient Active Problem List   Diagnosis Date Noted  . Hemiparesis affecting right side as late effect of cerebrovascular accident (CVA) (HCC) 03/16/2017  . Aphasia, late effect of cerebrovascular disease 03/16/2017  . Gait disturbance, post-stroke 03/16/2017  .  Dysarthria as late effect of stroke 03/16/2017  . Carotid artery stenosis with cerebral infarction (HCC) 03/07/2017  . Language impairment   . ICAO (internal carotid artery occlusion), left 02/22/2017  . Acute respiratory failure (HCC) 02/22/2017  . Obesity 02/22/2017  . Expressive aphasia 02/22/2017  . Acute ischemic left MCA stroke (HCC) 02/22/2017  . Left hemiparesis (HCC)   . Status post total bilateral knee replacement   . Benign essential HTN   . Right-sided extracranial carotid artery stenosis   . Dysphagia, post-stroke   . Acute blood loss anemia   . Nocturia   . Newly diagnosed diabetes (HCC)   . CVA (cerebral vascular accident) (HCC) - L MCA s/p tPA and thrombectomy 02/16/2017  . Anxiety 04/30/2015  . Benign fibroma of prostate 04/30/2015  . Clinical depression 04/30/2015  . Acid reflux 04/30/2015  . HLD (hyperlipidemia) 04/30/2015  . BP (high blood pressure) 04/30/2015  . Arthritis, degenerative 04/30/2015  . 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 Arthritis, septic, knee (HCC) 09/26/2012    Clydene Pugh 04/10/2017, 12:55 PM  [place] [facility] MAIN [facility] SERVICES [address] [place], [place], [address] Phone: [phone]   Fax:  [phone]   Name: ZIAN DELAIR MRN: [id number] Date of Birth: 02-Feb-1956

## 2017-04-10 NOTE — Patient Instructions (Addendum)
Abduction (Eccentric) - Active-Assist (Cane)    Use unaffected arm to push affected arm out to side. Avoid hiking shoulder. Keep palm relaxed. Slowly lower affected arm for 3-5 seconds. ___ reps per set, ___ sets per day, ___ days per week. Add ___ lbs when you achieve ___ repetitions. Try to increase use of affected arm during lift/lowering.  http://ecce.exer.us/162   Copyright  VHI. All rights reserved.  Cane Exercise: Extension    Stand holding cane behind back with both hands palm-up. Lift the cane away from body. Hold ____ seconds. Repeat ____ times. Do ____ sessions per day.  http://gt2.exer.us/83   Copyright  VHI. All rights reserved.  Cane Exercise: Extension / Internal Rotation    Stand holding cane behind back with both hands palm-up. Slide cane up spine toward head. Hold ____ seconds. Repeat ____ times. Do ____ sessions per day.  http://gt2.exer.us/85   Copyright  VHI. All rights reserved.  Cane Overhead - Standing    With arms straight, hold cane forward at waist. Raise cane above head. Hold ___ seconds. Repeat ___ times. Do ___ times per day.  Copyright  VHI. All rights reserved.  Pendulum Circular    Bend forward 90 at waist, leaning on table for support. Rock body in a circular pattern to move arm clockwise ____ times then counterclockwise ____ times. Do ____ sessions per day.

## 2017-04-10 NOTE — Therapy (Addendum)
[facility] MAIN [facility] SERVICES [address] [employer] [place], [place], [address] Phone: [phone]   Fax:  [phone]  Occupational Therapy Treatment  Patient Details  Name: Erik [name] MRN: [id number] Date of Birth: 10-03-1956 Referring Provider: Dr. Hildred Alamin  Encounter Date: 04/10/2017      OT End of Session - 04/10/17 0932    Visit Number 6   Number of Visits 24   Date for OT Re-Evaluation 06/04/17   OT Start Time 0915   OT Stop Time 1000   OT Time Calculation (min) 45 min   Activity Tolerance Patient tolerated treatment well   Behavior During Therapy [facility] for tasks assessed/performed      Past Medical History:  Diagnosis Date  . Anxiety   . Arthritis    hands  . BPH (benign prostatic hyperplasia)   . Depression   . 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 ED (erectile dysfunction)   . GERD (gastroesophageal reflux disease)   . H/O calcium pyrophosphate deposition disease (CPPD)   . HLD (hyperlipidemia)   . Hypertension   . Hypogonadism in male   . Lower urinary tract symptoms (LUTS)   . Nocturia   . Stroke [facility])    Language Impairment.  Left Hemiparesis- improved  . Testosterone overdose   . Vitamin D deficiency     Past Surgical History:  Procedure Laterality Date  . BACK SURGERY    . COLON SURGERY     Colon Resection  . COLONOSCOPY    . IR ANGIO INTRA EXTRACRAN SEL COM CAROTID INNOMINATE UNI L MOD SED  03/07/2017  . IR ANGIO INTRA EXTRACRAN SEL COM CAROTID INNOMINATE UNI R MOD SED  02/16/2017  . IR ANGIO VERTEBRAL SEL SUBCLAVIAN INNOMINATE UNI L MOD SED  03/07/2017  . IR ANGIO VERTEBRAL SEL VERTEBRAL UNI R MOD SED  03/07/2017  . IR INFUSION THROMBOL ARTERIAL INITIAL (MS)  03/07/2017  . IR INTRAVSC STENT CERV CAROTID W/EMB-PROT MOD SED INCL ANGIO  03/07/2017  . IR PERCUTANEOUS ART THROMBECTOMY/INFUSION INTRACRANIAL INC DIAG ANGIO  02/16/2017  . IR PTA NON CORO-LOWER EXTREM  02/16/2017  . IR RADIOLOGIST  EVAL & MGMT  04/04/2017  . IR US GUIDE VASC ACCESS LEFT  02/16/2017  . IR US GUIDE VASC ACCESS RIGHT  02/16/2017  . KNEE ARTHROSCOPY Bilateral   . RADIOLOGY WITH ANESTHESIA N/A 02/16/2017   Procedure: RADIOLOGY WITH ANESTHESIA;  Surgeon: Julieanne Cotton, MD;  Location: [facility] OR;  Service: Radiology;  Laterality: N/A;  . RADIOLOGY WITH ANESTHESIA N/A 03/07/2017   Procedure: CAROTID STENT;  Surgeon: Julieanne Cotton, MD;  Location: [facility] OR;  Service: Radiology;  Laterality: N/A;  . ROTATOR CUFF REPAIR Right     There were no vitals filed for this visit.      Subjective Assessment - 04/10/17 0930    Subjective  Pt. reports he is progressing. Pt. reports he is trying to stretch his arm on the tabletop at home. Pt. reports  he has 6/10 pain with this. Pt. was educated about sitting and performing flexion against gravity. Pt. does not have pain with this, and is able to achieve more ROM in the shoulder.    Patient is accompained by: Family member   Pertinent History Pt. is a 61 y.o. male who had a CVA on the evening of April 12th, 2018. Pt. had been to his grandson's ball game, and was preparing for bed when he felling getting into bed. According to  his wife, he struggled to get up. She could tell he was having a stroke, and called EMS. Pt. was transported to [facility],, was administered TPA,. Pt. underwent a cerebral angiogram with Rt ICA stent assisted angioplasty. Pt. received Therapy services in CIR. at [facility]   Currently in Pain? No/denies      OT TREATMENT    Neuro muscular re-education:  Pt. performed [facility] tasks using the Grooved pegboard. Pt. worked on grasping the grooved pegs from a horizontal position, and moving the pegs to a vertical position in the hand to prepare for placing them in the grooved slot.   Therapeutic Exercise:  Pt. Worked on the [employer] for 8 min. With constant monitoring of the BUEs. Pt. Worked on changing, and alternating forward reverse position every 2 min. Rest  breaks were required. Pt. performed 2# dowel ex. For UE strengthening secondary to weakness. Bilateral shoulder flexion, chest press, circular patterns, and elbow flexion/extension were performed. 3# dumbbell ex. for elbow flexion and extension, forearm supination/pronation, wrist flexion/extension, and radial deviation. Pt. requires rest breaks and verbal cues for proper technique. Pt. Shoulder flexion AROM is 146                            OT Education - 04/10/17 0932    Education provided Yes   Education Details RUE strength, and coordination skills.   Person(s) Educated Patient   Methods Explanation   Comprehension Verbalized understanding             OT Long Term Goals - 03/12/17 1228      OT LONG TERM GOAL #1   Title Pt. will independently, and efficiently write 3 sentences with 100% legibility.   Baseline Increased time required. 90% legibility for only 2 words in large print.   Time 12   Period Weeks   Status New     OT LONG TERM GOAL #2   Title Pt. will independently type a paragraph efficiently.   Baseline Pt. is unable   Time 12   Period Weeks   Status New     OT LONG TERM GOAL #3   Title Pt. improve right grip strength by 5 pounds to be able to hold and use a wrench.   Baseline Pt. is unable   Time 12   Period Weeks   Status New     OT LONG TERM GOAL #4   Title Pt. will improve right lateral pinch strength by 3# to be able to put toothpaste on a toothbrush.   Baseline Pt. is unable to apply toothpaste onto a toothbrush.   Time 12   Period Weeks   Status New     OT LONG TERM GOAL #5   Title Pt. will improve FMC by 10 sec. to be able to independently handle, and count change.   Baseline Pt. has difficulty   Time 12   Period Weeks   Status New     Long Term Additional Goals   Additional Long Term Goals Yes     OT LONG TERM GOAL #6   Title Pt. will increase RUE strength by 2mm grades to be able to complete ADLs/IADLs.    Baseline Pt. has difficulty   Time 12   Period Weeks   Status New     OT LONG TERM GOAL #7   Title Pt. will demonstrate independence with visual compensatory strategies for the right 100% of the time during ADL,  and IADL tasks.   Baseline Limited on the right. Pt. occ. misses obstacle on the right.   Time 12   Period Weeks   Status New               Plan - 04/10/17 [id number]    Clinical Impression Statement Pt. reports he has increased fatigue, and yawns alot. Pt. reports weakness in his RUE. Pt. continues to work on improving RUE strength, and coordination skills for improved functional use during ADLs, and IADLs.   Occupational performance deficits (Please refer to evaluation for details): ADL's   Rehab Potential Good   OT Frequency 2x / week   OT Duration 12 weeks   OT Treatment/Interventions Self-care/ADL training;Therapeutic exercise;Therapeutic exercises;Therapeutic activities;DME and/or AE instruction;Neuromuscular education;Patient/family education;Functional Mobility Training;Cognitive remediation/compensation;Visual/perceptual remediation/compensation;Energy conservation   Consulted and Agree with Plan of Care Patient;Family member/caregiver   Family Member Consulted Wife      Patient will benefit from skilled therapeutic intervention in order to improve the following deficits and impairments:  Decreased coordination, Decreased balance, Impaired UE functional use, Decreased cognition, Impaired vision/preception, Decreased strength, Decreased knowledge of use of DME, Decreased activity tolerance, Impaired tone, Impaired flexibility, Decreased range of motion, Decreased endurance  Visit Diagnosis: Muscle weakness (generalized)  Other lack of coordination    Problem List Patient Active Problem List   Diagnosis Date Noted  . Hemiparesis affecting right side as late effect of cerebrovascular accident (CVA) (HCC) 03/16/2017  . Aphasia, late effect of cerebrovascular  disease 03/16/2017  . Gait disturbance, post-stroke 03/16/2017  . Dysarthria as late effect of stroke 03/16/2017  . Carotid artery stenosis with cerebral infarction (HCC) 03/07/2017  . Language impairment   . ICAO (internal carotid artery occlusion), left 02/22/2017  . Acute respiratory failure (HCC) 02/22/2017  . Obesity 02/22/2017  . Expressive aphasia 02/22/2017  . Acute ischemic left MCA stroke (HCC) 02/22/2017  . Left hemiparesis (HCC)   . Status post total bilateral knee replacement   . Benign essential HTN   . Right-sided extracranial carotid artery stenosis   . Dysphagia, post-stroke   . Acute blood loss anemia   . Nocturia   . Newly diagnosed diabetes (HCC)   . CVA (cerebral vascular accident) (HCC) - L MCA s/p tPA and thrombectomy 02/16/2017  . Anxiety 04/30/2015  . Benign fibroma of prostate 04/30/2015  . Clinical depression 04/30/2015  . Acid reflux 04/30/2015  . HLD (hyperlipidemia) 04/30/2015  . BP (high blood pressure) 04/30/2015  . Arthritis, degenerative 04/30/2015  . 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 Arthritis, septic, knee (HCC) 09/26/2012    Olegario MessierElaine Marlyn Rabine, MS, OTR/L 04/10/2017, 9:57 AM  [facility]IN [facility] [address] [place], [place], [address] Phone: [phone]   Fax:  [phone]  Name: Erik [name] MRN: [id number] Date of Birth: [birth year]

## 2017-04-10 NOTE — Therapy (Signed)
[facility] MAIN [facility] SERVICES [address] [place], [place], [address] Phone: [phone]   Fax:  [phone]  Occupational Therapy Treatment  Patient Details  Name: Erik [name] MRN: [id number] Date of Birth: Feb 13, 1956 Referring Provider: Dr. Hildred Alamin  Encounter Date: 04/10/2017      OT End of Session - 04/10/17 0932    Visit Number 6   Number of Visits 24   Date for OT Re-Evaluation 06/04/17   OT Start Time 0915   OT Stop Time 1000   OT Time Calculation (min) 45 min   Activity Tolerance Patient tolerated treatment well   Behavior During Therapy [facility] for tasks assessed/performed      Past Medical History:  Diagnosis Date  . Anxiety   . Arthritis    hands  . BPH (benign prostatic hyperplasia)   . Depression   . 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 ED (erectile dysfunction)   . GERD (gastroesophageal reflux disease)   . H/O calcium pyrophosphate deposition disease (CPPD)   . HLD (hyperlipidemia)   . Hypertension   . Hypogonadism in male   . Lower urinary tract symptoms (LUTS)   . Nocturia   . Stroke [facility])    Language Impairment.  Left Hemiparesis- improved  . Testosterone overdose   . Vitamin D deficiency     Past Surgical History:  Procedure Laterality Date  . BACK SURGERY    . COLON SURGERY     Colon Resection  . COLONOSCOPY    . IR ANGIO INTRA EXTRACRAN SEL COM CAROTID INNOMINATE UNI L MOD SED  03/07/2017  . IR ANGIO INTRA EXTRACRAN SEL COM CAROTID INNOMINATE UNI R MOD SED  02/16/2017  . IR ANGIO VERTEBRAL SEL SUBCLAVIAN INNOMINATE UNI L MOD SED  03/07/2017  . IR ANGIO VERTEBRAL SEL VERTEBRAL UNI R MOD SED  03/07/2017  . IR INFUSION THROMBOL ARTERIAL INITIAL (MS)  03/07/2017  . IR INTRAVSC STENT CERV CAROTID W/EMB-PROT MOD SED INCL ANGIO  03/07/2017  . IR PERCUTANEOUS ART THROMBECTOMY/INFUSION INTRACRANIAL INC DIAG ANGIO  02/16/2017  . IR PTA NON CORO-LOWER EXTREM  02/16/2017  . IR RADIOLOGIST  EVAL & MGMT  04/04/2017  . IR US GUIDE VASC ACCESS LEFT  02/16/2017  . IR US GUIDE VASC ACCESS RIGHT  02/16/2017  . KNEE ARTHROSCOPY Bilateral   . RADIOLOGY WITH ANESTHESIA N/A 02/16/2017   Procedure: RADIOLOGY WITH ANESTHESIA;  Surgeon: Julieanne Cotton, MD;  Location: [facility] OR;  Service: Radiology;  Laterality: N/A;  . RADIOLOGY WITH ANESTHESIA N/A 03/07/2017   Procedure: CAROTID STENT;  Surgeon: Julieanne Cotton, MD;  Location: [facility] OR;  Service: Radiology;  Laterality: N/A;  . ROTATOR CUFF REPAIR Right     There were no vitals filed for this visit.      Subjective Assessment - 04/10/17 0930    Subjective  Pt. reports he is progressing   Patient is accompained by: Family member   Pertinent History Pt. is a 61 y.o. male who had a CVA on the evening of April 12th, 2018. Pt. had been to his grandson's ball game, and was preparing for bed when he felling getting into bed. According to his wife, he struggled to get up. She could tell he was having a stroke, and called EMS. Pt. was transported to [facility],, was administered TPA,. Pt. underwent a cerebral angiogram with Rt ICA stent assisted angioplasty. Pt. received Therapy services in CIR. at [facility]   Currently in  Pain? No/denies                              OT Education - 04/10/17 0932    Education provided Yes   Education Details RUE strength, and coordination skills.   Person(s) Educated Patient   Methods Explanation   Comprehension Verbalized understanding             OT Long Term Goals - 03/12/17 1228      OT LONG TERM GOAL #1   Title Pt. will independently, and efficiently write 3 sentences with 100% legibility.   Baseline Increased time required. 90% legibility for only 2 words in large print.   Time 12   Period Weeks   Status New     OT LONG TERM GOAL #2   Title Pt. will independently type a paragraph efficiently.   Baseline Pt. is unable   Time 12   Period Weeks   Status New     OT  LONG TERM GOAL #3   Title Pt. improve right grip strength by 5 pounds to be able to hold and use a wrench.   Baseline Pt. is unable   Time 12   Period Weeks   Status New     OT LONG TERM GOAL #4   Title Pt. will improve right lateral pinch strength by 3# to be able to put toothpaste on a toothbrush.   Baseline Pt. is unable to apply toothpaste onto a toothbrush.   Time 12   Period Weeks   Status New     OT LONG TERM GOAL #5   Title Pt. will improve FMC by 10 sec. to be able to independently handle, and count change.   Baseline Pt. has difficulty   Time 12   Period Weeks   Status New     Long Term Additional Goals   Additional Long Term Goals Yes     OT LONG TERM GOAL #6   Title Pt. will increase RUE strength by 2mm grades to be able to complete ADLs/IADLs.   Baseline Pt. has difficulty   Time 12   Period Weeks   Status New     OT LONG TERM GOAL #7   Title Pt. will demonstrate independence with visual compensatory strategies for the right 100% of the time during ADL, and IADL tasks.   Baseline Limited on the right. Pt. occ. misses obstacle on the right.   Time 12   Period Weeks   Status New               Plan - 04/10/17 [id number]    Clinical Impression Statement Pt. reports he has increased fatigue, and yawns alot. Pt. continues to work on improving RUE strength, and coordination skills. for improved functional use during ADLs, and IADLs.   Occupational performance deficits (Please refer to evaluation for details): ADL's   Rehab Potential Good   OT Frequency 2x / week   OT Duration 12 weeks   OT Treatment/Interventions Self-care/ADL training;Therapeutic exercise;Therapeutic exercises;Therapeutic activities;DME and/or AE instruction;Neuromuscular education;Patient/family education;Functional Mobility Training;Cognitive remediation/compensation;Visual/perceptual remediation/compensation;Energy conservation   Consulted and Agree with Plan of Care Patient;Family  member/caregiver   Family Member Consulted Wife      Patient will benefit from skilled therapeutic intervention in order to improve the following deficits and impairments:  Decreased coordination, Decreased balance, Impaired UE functional use, Decreased cognition, Impaired vision/preception, Decreased strength, Decreased knowledge of use of DME, Decreased activity  tolerance, Impaired tone, Impaired flexibility, Decreased range of motion, Decreased endurance  Visit Diagnosis: Muscle weakness (generalized)  Other lack of coordination    Problem List Patient Active Problem List   Diagnosis Date Noted  . Hemiparesis affecting right side as late effect of cerebrovascular accident (CVA) (HCC) 03/16/2017  . Aphasia, late effect of cerebrovascular disease 03/16/2017  . Gait disturbance, post-stroke 03/16/2017  . Dysarthria as late effect of stroke 03/16/2017  . Carotid artery stenosis with cerebral infarction (HCC) 03/07/2017  . Language impairment   . ICAO (internal carotid artery occlusion), left 02/22/2017  . Acute respiratory failure (HCC) 02/22/2017  . Obesity 02/22/2017  . Expressive aphasia 02/22/2017  . Acute ischemic left MCA stroke (HCC) 02/22/2017  . Left hemiparesis (HCC)   . Status post total bilateral knee replacement   . Benign essential HTN   . Right-sided extracranial carotid artery stenosis   . Dysphagia, post-stroke   . Acute blood loss anemia   . Nocturia   . Newly diagnosed diabetes (HCC)   . CVA (cerebral vascular accident) (HCC) - L MCA s/p tPA and thrombectomy 02/16/2017  . Anxiety 04/30/2015  . Benign fibroma of prostate 04/30/2015  . Clinical depression 04/30/2015  . Acid reflux 04/30/2015  . HLD (hyperlipidemia) 04/30/2015  . BP (high blood pressure) 04/30/2015  . Arthritis, degenerative 04/30/2015  . 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 Arthritis, septic, knee (HCC) 09/26/2012    Olegario Messier 04/10/2017, 9:40  AM  [facility] MAIN [facility] SERVICES [address] [place], [place], [address] Phone: [phone]   Fax:  [phone]  Name: KHANH TANORI MRN: [id number] Date of Birth: 10-May-1956

## 2017-04-10 NOTE — Therapy (Signed)
[facility] MAIN [facility] SERVICES [address] [place], [place], [address] Phone: [phone]   Fax:  [phone]  Physical Therapy Treatment  Patient Details  Name: Erik [name] MRN: [id number] Date of Birth: [birth year] Referring Provider: Cruzita Lederer  Encounter Date: 04/10/2017      PT End of Session - 04/10/17 1051    Visit Number 8   Number of Visits 17   Date for PT Re-Evaluation 05/09/17   PT Start Time 1001   PT Stop Time 1046   PT Time Calculation (min) 45 min   Equipment Utilized During Treatment Other (comment)  S-min A prn   Activity Tolerance Patient tolerated treatment well   Behavior During Therapy [facility] for tasks assessed/performed      Past Medical History:  Diagnosis Date  . Anxiety   . Arthritis    hands  . BPH (benign prostatic hyperplasia)   . Depression   . 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 ED (erectile dysfunction)   . GERD (gastroesophageal reflux disease)   . H/O calcium pyrophosphate deposition disease (CPPD)   . HLD (hyperlipidemia)   . Hypertension   . Hypogonadism in male   . Lower urinary tract symptoms (LUTS)   . Nocturia   . Stroke [facility])    Language Impairment.  Left Hemiparesis- improved  . Testosterone overdose   . Vitamin D deficiency     Past Surgical History:  Procedure Laterality Date  . BACK SURGERY    . COLON SURGERY     Colon Resection  . COLONOSCOPY    . IR ANGIO INTRA EXTRACRAN SEL COM CAROTID INNOMINATE UNI L MOD SED  03/07/2017  . IR ANGIO INTRA EXTRACRAN SEL COM CAROTID INNOMINATE UNI R MOD SED  02/16/2017  . IR ANGIO VERTEBRAL SEL SUBCLAVIAN INNOMINATE UNI L MOD SED  03/07/2017  . IR ANGIO VERTEBRAL SEL VERTEBRAL UNI R MOD SED  03/07/2017  . IR INFUSION THROMBOL ARTERIAL INITIAL (MS)  03/07/2017  . IR INTRAVSC STENT CERV CAROTID W/EMB-PROT MOD SED INCL ANGIO  03/07/2017  . IR PERCUTANEOUS ART THROMBECTOMY/INFUSION INTRACRANIAL INC DIAG ANGIO   02/16/2017  . IR PTA NON CORO-LOWER EXTREM  02/16/2017  . IR RADIOLOGIST EVAL & MGMT  04/04/2017  . IR US GUIDE VASC ACCESS LEFT  02/16/2017  . IR US GUIDE VASC ACCESS RIGHT  02/16/2017  . KNEE ARTHROSCOPY Bilateral   . RADIOLOGY WITH ANESTHESIA N/A 02/16/2017   Procedure: RADIOLOGY WITH ANESTHESIA;  Surgeon: Julieanne Cotton, MD;  Location: [facility] OR;  Service: Radiology;  Laterality: N/A;  . RADIOLOGY WITH ANESTHESIA N/A 03/07/2017   Procedure: CAROTID STENT;  Surgeon: Julieanne Cotton, MD;  Location: [facility] OR;  Service: Radiology;  Laterality: N/A;  . ROTATOR CUFF REPAIR Right     There were no vitals filed for this visit.  Neuro Re-ed Half foam roller w shoulder flexion 3lb weighted bar 2x10lb  Bosu ball lunges with shoulder abduction and rotation twists 3lb weighted bar 2x12 Single limb stance Airex pad balance with shoulder IR and 3lb weighted bar 2x10 Side stepping onto and over 6" step 12x Crane step ups with SLS focus on top of 6" step 10x  TherEx Side shuffle // bars Agility floor ladder: side step 2 feet per square 4x, forward slant angle step 1x Shoulder AROM (limited with RUE-affecting dynamic balance activities) Hamstring curls blue swiss ball 15x Bridge blue swiss ball 10x  Bridge single leg march 10x  Subjective Assessment - 04/10/17 1050    Subjective Patient reports feeling good today and has not had any falls since last visit.    Pertinent History Pt. is a 61 y.o. male who had a CVA on the evening of April 12th, 2018. Pt had been to his grandson's ball game that day and watched a movie with his wife. He got in bed and his wife went to take a bath. When she came out pt was on the floor struggling to get back in bed. Pt had become weak while in bed, tried to sit up and fell out of the bed. She could tell he was having a stroke, and called EMS. Pt was transported to [facility] and CTA head/neck revealed hyperdense L-MCA sign likely indicating acute thrombus. He received  tPA and he underwent cerebral angiogram with L-MCA thrombectomy, dense calcified plaque L-ICA precluded stent placement and noted to have 73% stenosis R-ICA due to advanced atherosclerotic changes. Patient with resultant facial weakness with mild dysarthria, aphasia, mild left gaze preference and right sided weakness affecting mobility and ability to carry out ADL tasks. He was in CIR for approximately 8 days per patient and then discharged home. He has been evaluated by outpatient speech and occupational therapy and is now here with wife today for PT evaluation.    Patient Stated Goals Improve his strength and balance in order to improve his function at home and be able to return to work.    Currently in Pain? Yes   Pain Score 1    Pain Location Shoulder   Pain Orientation Right         PT Long Term Goals - 03/14/17 1513      PT LONG TERM GOAL #1   Title Pt will be independent with HEP in order to improve strength and balance in order to decrease fall risk and improve function at home and work.    Time 8   Period Weeks   Status New     PT LONG TERM GOAL #2   Title Pt will improve BERG by at least 3 points in order to demonstrate clinically significant improvement in balance.   Baseline 03/14/17: 49/56   Time 8   Period Weeks   Status New     PT LONG TERM GOAL #3   Title Pt will improve DGI by at least 3 points in order to demonstrate clinically significant improvement in balance and decreased risk for falls    Baseline 03/14/17: 17/24   Time 8   Period Weeks   Status New     PT LONG TERM GOAL #4   Title Pt will improve R hip abduction, adduction, and flexion strength by at least 1/2 MMT in order to improve gait    Baseline 03/14/17: see flowsheet   Time 8   Status New               Plan - 04/10/17 1903    Clinical Impression Statement Patient demonstrated progression of functional strength and balance. Implementation of UE mobility challenged dynamic balance. R shoulder  limited in mobility and benefited from repeated motion. Combining UE and LE dynamic motions allow for functional challenges of balance and ambulation for return to previous level of function. Patient will continue to benefit from skilled physical therapy to improve strength, balance, and gait to improve safety during functional mobility.    Rehab Potential Good   Clinical Impairments Affecting Rehab Potential Positive: age, motivation, social support; Negative: significant  ICA stenosis   PT Frequency 2x / week   PT Duration 8 weeks   PT Treatment/Interventions ADLs/Self Care Home Management;Aquatic Therapy;Biofeedback;Cryotherapy;Electrical Stimulation;Iontophoresis 4mg /ml Dexamethasone;Moist Heat;Ultrasound;DME Instruction;Gait training;Stair training;Functional mobility training;Therapeutic activities;Therapeutic exercise;Balance training;Neuromuscular re-education;Patient/family education;Manual techniques;Passive range of motion;Energy conservation   PT Next Visit Plan dynamic balance   PT Home Exercise Plan Hip 3 ways with RTB, mini squats, RTB resisted R ankle DF, toe taps, semitandem/tandem balance; Bil concentric and eccentric R calf raises; R hamstring curls wiith GTB   Consulted and Agree with Plan of Care Patient      Patient will benefit from skilled therapeutic intervention in order to improve the following deficits and impairments:  Abnormal gait, Decreased balance, Decreased coordination, Decreased mobility, Decreased strength, Difficulty walking  Visit Diagnosis: Muscle weakness (generalized)  Other lack of coordination  Hemiplegia and hemiparesis following cerebral infarction affecting right dominant side (HCC)  Unsteadiness on feet     Problem List Patient Active Problem List   Diagnosis Date Noted  . Hemiparesis affecting right side as late effect of cerebrovascular accident (CVA) (HCC) 03/16/2017  . Aphasia, late effect of cerebrovascular disease 03/16/2017  . Gait  disturbance, post-stroke 03/16/2017  . Dysarthria as late effect of stroke 03/16/2017  . Carotid artery stenosis with cerebral infarction (HCC) 03/07/2017  . Language impairment   . ICAO (internal carotid artery occlusion), left 02/22/2017  . Acute respiratory failure (HCC) 02/22/2017  . Obesity 02/22/2017  . Expressive aphasia 02/22/2017  . Acute ischemic left MCA stroke (HCC) 02/22/2017  . Left hemiparesis (HCC)   . Status post total bilateral knee replacement   . Benign essential HTN   . Right-sided extracranial carotid artery stenosis   . Dysphagia, post-stroke   . Acute blood loss anemia   . Nocturia   . Newly diagnosed diabetes (HCC)   . CVA (cerebral vascular accident) (HCC) - L MCA s/p tPA and thrombectomy 02/16/2017  . Anxiety 04/30/2015  . Benign fibroma of prostate 04/30/2015  . Clinical depression 04/30/2015  . Acid reflux 04/30/2015  . HLD (hyperlipidemia) 04/30/2015  . BP (high blood pressure) 04/30/2015  . Arthritis, degenerative 04/30/2015  . 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 Arthritis, septic, knee (HCC) 09/26/2012   Precious BardMarina Rajendra Spiller, PT, DPT   04/10/2017, 7:05 PM  [facility]IN [facility] [address] [place], [place], [address] Phone: [phone]   Fax:  [phone]  Name: Erik [name] MRN: [id number] Date of Birth: Dec 14, 1955

## 2017-04-12 ENCOUNTER — Encounter: Payer: Self-pay | Admitting: Occupational Therapy

## 2017-04-12 ENCOUNTER — Ambulatory Visit: Payer: Managed Care, Other (non HMO)

## 2017-04-12 ENCOUNTER — Ambulatory Visit: Payer: Managed Care, Other (non HMO) | Admitting: Occupational Therapy

## 2017-04-12 ENCOUNTER — Ambulatory Visit: Payer: Managed Care, Other (non HMO) | Admitting: Speech Pathology

## 2017-04-12 DIAGNOSIS — R278 Other lack of coordination: Secondary | ICD-10-CM

## 2017-04-12 DIAGNOSIS — M6281 Muscle weakness (generalized): Secondary | ICD-10-CM

## 2017-04-12 DIAGNOSIS — R471 Dysarthria and anarthria: Secondary | ICD-10-CM

## 2017-04-12 DIAGNOSIS — R4701 Aphasia: Secondary | ICD-10-CM

## 2017-04-12 DIAGNOSIS — R2681 Unsteadiness on feet: Secondary | ICD-10-CM

## 2017-04-12 NOTE — Therapy (Signed)
[facility] MAIN [facility] SERVICES [address] [place], [place], [address] Phone: [phone]   Fax:  [phone]  Physical Therapy Treatment  Patient Details  Name: Erik [name] MRN: [id number] Date of Birth: [birth year] Referring Provider: Cruzita Lederer  Encounter Date: 04/12/2017      PT End of Session - 04/12/17 1147    Visit Number 9   Number of Visits 17   Date for PT Re-Evaluation 05/09/17   PT Start Time 1001   PT Stop Time 1046   PT Time Calculation (min) 45 min   Equipment Utilized During Treatment Other (comment);Gait belt  S-min A prn   Activity Tolerance Patient tolerated treatment well   Behavior During Therapy [facility] for tasks assessed/performed      Past Medical History:  Diagnosis Date  . Anxiety   . Arthritis    hands  . BPH (benign prostatic hyperplasia)   . Depression   . 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 ED (erectile dysfunction)   . GERD (gastroesophageal reflux disease)   . H/O calcium pyrophosphate deposition disease (CPPD)   . HLD (hyperlipidemia)   . Hypertension   . Hypogonadism in male   . Lower urinary tract symptoms (LUTS)   . Nocturia   . Stroke [facility])    Language Impairment.  Left Hemiparesis- improved  . Testosterone overdose   . Vitamin D deficiency     Past Surgical History:  Procedure Laterality Date  . BACK SURGERY    . COLON SURGERY     Colon Resection  . COLONOSCOPY    . IR ANGIO INTRA EXTRACRAN SEL COM CAROTID INNOMINATE UNI L MOD SED  03/07/2017  . IR ANGIO INTRA EXTRACRAN SEL COM CAROTID INNOMINATE UNI R MOD SED  02/16/2017  . IR ANGIO VERTEBRAL SEL SUBCLAVIAN INNOMINATE UNI L MOD SED  03/07/2017  . IR ANGIO VERTEBRAL SEL VERTEBRAL UNI R MOD SED  03/07/2017  . IR INFUSION THROMBOL ARTERIAL INITIAL (MS)  03/07/2017  . IR INTRAVSC STENT CERV CAROTID W/EMB-PROT MOD SED INCL ANGIO  03/07/2017  . IR PERCUTANEOUS ART THROMBECTOMY/INFUSION INTRACRANIAL INC DIAG  ANGIO  02/16/2017  . IR PTA NON CORO-LOWER EXTREM  02/16/2017  . IR RADIOLOGIST EVAL & MGMT  04/04/2017  . IR US GUIDE VASC ACCESS LEFT  02/16/2017  . IR US GUIDE VASC ACCESS RIGHT  02/16/2017  . KNEE ARTHROSCOPY Bilateral   . RADIOLOGY WITH ANESTHESIA N/A 02/16/2017   Procedure: RADIOLOGY WITH ANESTHESIA;  Surgeon: Julieanne Cotton, MD;  Location: [facility] OR;  Service: Radiology;  Laterality: N/A;  . RADIOLOGY WITH ANESTHESIA N/A 03/07/2017   Procedure: CAROTID STENT;  Surgeon: Julieanne Cotton, MD;  Location: [facility] OR;  Service: Radiology;  Laterality: N/A;  . ROTATOR CUFF REPAIR Right     There were no vitals filed for this visit.      Subjective Assessment - 04/12/17 1005    Subjective Pt reports no trips or stumbles since last visit. Has an upcoming beach trip.    Pertinent History Pt. is a 61 y.o. male who had a CVA on the evening of April 12th, 2018. Pt had been to his grandson's ball game that day and watched a movie with his wife. He got in bed and his wife went to take a bath. When she came out pt was on the floor struggling to get back in bed. Pt had become weak while in bed, tried to sit up and fell out  of the bed. She could tell he was having a stroke, and called EMS. [facility] and CTA head/neck revealed hyperdense L-MCA sign likely indicating acute thrombus. He received tPA and he underwent cerebral angiogram with L-MCA thrombectomy, dense calcified plaque L-ICA precluded stent placement and noted to have 73% stenosis R-ICA due to advanced atherosclerotic changes. Patient with resultant facial weakness with mild dysarthria, aphasia, mild left gaze preference and right sided weakness affecting mobility and ability to carry out ADL tasks. He was in CIR for approximately 8 days per patient and then discharged home. He has been evaluated by outpatient speech and occupational therapy and is now here with wife today for PT evaluation.    Patient Stated Goals Improve his strength  and balance in order to improve his function at home and be able to return to work.    Currently in Pain? Yes   Pain Score 1    Pain Location Shoulder   Pain Orientation Right     Neuro Re-ed Half foam roller w shoulder flexion 4lb weighted bar 2x10lb with cues to keep foot forward Bosu ball lunges with shoulder abduction and rotation twists 4lb weighted bar 2x12,great improvement over last session Single limb stance Side stepping onto and over 6" step 12x Step up curls 4lb bar 10x onto 6" step Yellow pad weight shifting 10x without df assist on   TherEx Agility floor ladder: side step 2 feet per square 4x, forward slant angle step 1x Shoulder AROM (limited with RUE-affecting dynamic balance activities) TRX squats 2x10  Ankle inversion 2x10 RTB with manual cues to not IR thigh for compensation without df assist device    Ambulating 6820ft without df assist: slight eversion, improved df and pf however still some slight delay and foot flat contact (no longer foot slap).    Pt. response to medical necessity:  Patient will continue to benefit from skilled physical therapy to improve strength, balance, and gait to improve safety during functional mobility.       PT Education - 04/12/17 1147    Education provided Yes   Education Details body  mechanics, TRX exercise   Person(s) Educated Patient   Methods Explanation;Demonstration   Comprehension Verbalized understanding;Returned demonstration             PT Long Term Goals - 03/14/17 1513      PT LONG TERM GOAL #1   Title Pt will be independent with HEP in order to improve strength and balance in order to decrease fall risk and improve function at home and work.    Time 8   Period Weeks   Status New     PT LONG TERM GOAL #2   Title Pt will improve BERG by at least 3 points in order to demonstrate clinically significant improvement in balance.   Baseline 03/14/17: 49/56   Time 8   Period Weeks   Status New     PT LONG  TERM GOAL #3   Title Pt will improve DGI by at least 3 points in order to demonstrate clinically significant improvement in balance and decreased risk for falls    Baseline 03/14/17: 17/24   Time 8   Period Weeks   Status New     PT LONG TERM GOAL #4   Title Pt will improve R hip abduction, adduction, and flexion strength by at least 1/2 MMT in order to improve gait    Baseline 03/14/17: see flowsheet   Time 8  Status New               Plan - 04/12/17 1151    Clinical Impression Statement Patient presents with increased R foot strength with and without DF assist brace. Gait deviations still noted when ambulating without brace but progress is notable in pt.'s ability to control df and pf. Implementation of TRX was introduced with pt. Demonstrating capability of complex strengthening maneuver. Combining UE and LE dynamic motions allow for functional challenges of balance and ambulation for return to previous level of function. Patient will continue to benefit from skilled physical therapy to improve strength, balance, and gait to improve safety during functional mobility.   Rehab Potential Good   Clinical Impairments Affecting Rehab Potential Positive: age, motivation, social support; Negative: significant ICA stenosis   PT Frequency 2x / week   PT Duration 8 weeks   PT Treatment/Interventions ADLs/Self Care Home Management;Aquatic Therapy;Biofeedback;Cryotherapy;Electrical Stimulation;Iontophoresis 4mg /ml Dexamethasone;Moist Heat;Ultrasound;DME Instruction;Gait training;Stair training;Functional mobility training;Therapeutic activities;Therapeutic exercise;Balance training;Neuromuscular re-education;Patient/family education;Manual techniques;Passive range of motion;Energy conservation   PT Next Visit Plan dynamic balance, trx   PT Home Exercise Plan Hip 3 ways with RTB, mini squats, RTB resisted R ankle DF, toe taps, semitandem/tandem balance; Bil concentric and eccentric R calf raises; R  hamstring curls wiith GTB   Consulted and Agree with Plan of Care Patient      Patient will benefit from skilled therapeutic intervention in order to improve the following deficits and impairments:  Abnormal gait, Decreased balance, Decreased coordination, Decreased mobility, Decreased strength, Difficulty walking  Visit Diagnosis: Muscle weakness (generalized)  Other lack of coordination  Unsteadiness on feet     Problem List Patient Active Problem List   Diagnosis Date Noted  . Hemiparesis affecting right side as late effect of cerebrovascular accident (CVA) (HCC) 03/16/2017  . Aphasia, late effect of cerebrovascular disease 03/16/2017  . Gait disturbance, post-stroke 03/16/2017  . Dysarthria as late effect of stroke 03/16/2017  . Carotid artery stenosis with cerebral infarction (HCC) 03/07/2017  . Language impairment   . ICAO (internal carotid artery occlusion), left 02/22/2017  . Acute respiratory failure (HCC) 02/22/2017  . Obesity 02/22/2017  . Expressive aphasia 02/22/2017  . Acute ischemic left MCA stroke (HCC) 02/22/2017  . Left hemiparesis (HCC)   . Status post total bilateral knee replacement   . Benign essential HTN   . Right-sided extracranial carotid artery stenosis   . Dysphagia, post-stroke   . Acute blood loss anemia   . Nocturia   . Newly diagnosed diabetes (HCC)   . CVA (cerebral vascular accident) (HCC) - L MCA s/p tPA and thrombectomy 02/16/2017  . Anxiety 04/30/2015  . Benign fibroma of prostate 04/30/2015  . Clinical depression 04/30/2015  . Acid reflux 04/30/2015  . HLD (hyperlipidemia) 04/30/2015  . BP (high blood pressure) 04/30/2015  . Arthritis, degenerative 04/30/2015  . 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 Arthritis, septic, knee (HCC) 09/26/2012   Precious Bard, PT, DPT   04/12/2017, 11:53 AM  [facility] MAIN [facility] SERVICES [address] [place], [place],  [address] Phone: [phone]   Fax:  [phone]  Name: Erik [name] MRN: [id number] Date of Birth: 1956-05-04

## 2017-04-12 NOTE — Therapy (Addendum)
[facility] MAIN [facility] SERVICES [address] [place], [place], [address] Phone: [phone]   Fax:  [phone]  Occupational Therapy Treatment  Patient Details  Name: Erik [name] MRN: [id number] Date of Birth: [birth year] Referring Provider: Dr. Hildred Alamin  Encounter Date: 04/12/2017      OT End of Session - 04/12/17 0939    Visit Number 7   Number of Visits 24   Date for OT Re-Evaluation 06/04/17   OT Start Time 0915   OT Stop Time 1000   OT Time Calculation (min) 45 min   Activity Tolerance Patient tolerated treatment well   Behavior During Therapy [facility] for tasks assessed/performed      Past Medical History:  Diagnosis Date  . Anxiety   . Arthritis    hands  . BPH (benign prostatic hyperplasia)   . Depression   . 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 ED (erectile dysfunction)   . GERD (gastroesophageal reflux disease)   . H/O calcium pyrophosphate deposition disease (CPPD)   . HLD (hyperlipidemia)   . Hypertension   . Hypogonadism in male   . Lower urinary tract symptoms (LUTS)   . Nocturia   . Stroke [facility])    Language Impairment.  Left Hemiparesis- improved  . Testosterone overdose   . Vitamin D deficiency     Past Surgical History:  Procedure Laterality Date  . BACK SURGERY    . COLON SURGERY     Colon Resection  . COLONOSCOPY    . IR ANGIO INTRA EXTRACRAN SEL COM CAROTID INNOMINATE UNI L MOD SED  03/07/2017  . IR ANGIO INTRA EXTRACRAN SEL COM CAROTID INNOMINATE UNI R MOD SED  02/16/2017  . IR ANGIO VERTEBRAL SEL SUBCLAVIAN INNOMINATE UNI L MOD SED  03/07/2017  . IR ANGIO VERTEBRAL SEL VERTEBRAL UNI R MOD SED  03/07/2017  . IR INFUSION THROMBOL ARTERIAL INITIAL (MS)  03/07/2017  . IR INTRAVSC STENT CERV CAROTID W/EMB-PROT MOD SED INCL ANGIO  03/07/2017  . IR PERCUTANEOUS ART THROMBECTOMY/INFUSION INTRACRANIAL INC DIAG ANGIO  02/16/2017  . IR PTA NON CORO-LOWER EXTREM  02/16/2017  . IR RADIOLOGIST  EVAL & MGMT  04/04/2017  . IR US GUIDE VASC ACCESS LEFT  02/16/2017  . IR US GUIDE VASC ACCESS RIGHT  02/16/2017  . KNEE ARTHROSCOPY Bilateral   . RADIOLOGY WITH ANESTHESIA N/A 02/16/2017   Procedure: RADIOLOGY WITH ANESTHESIA;  Surgeon: Julieanne Cotton, MD;  Location: [facility] OR;  Service: Radiology;  Laterality: N/A;  . RADIOLOGY WITH ANESTHESIA N/A 03/07/2017   Procedure: CAROTID STENT;  Surgeon: Julieanne Cotton, MD;  Location: [facility] OR;  Service: Radiology;  Laterality: N/A;  . ROTATOR CUFF REPAIR Right     There were no vitals filed for this visit.      Subjective Assessment - 04/12/17 0937    Subjective  Pt. reports he is using his RUE more now.   Patient is accompained by: Family member   Pertinent History Pt. is a 61 y.o. male who had a CVA on the evening of April 12th, 2018. Pt. had been to his grandson's ball game, and was preparing for bed when he felling getting into bed. According to his wife, he struggled to get up. She could tell he was having a stroke, and called EMS. Pt. was transported to [facility],, was administered TPA,. Pt. underwent a cerebral angiogram with Rt ICA stent assisted angioplasty. Pt. received Therapy services in CIR. at [facility].  Currently in Pain? No/denies        OT TREATMENT    Neuro muscular re-education:  Pt. performed [facility] skills training to improve speed and dexterity needed for ADL tasks and writing. Pt. demonstrated grasping 1 inch sticks,  inch cylindrical collars, and  inch flat washers on the Purdue pegboard. Pt. performed grasping each item with her 2nd digit and thumb, and storing them in the palm. Pt. presented with difficulty storing  inch objects at a time in the palmar aspect of the hand. Pt. Worked on alternating bilateral hand movements to place them back.  Therapeutic Exercise:  Pt. Worked on the [employer] for 8 min. With constant monitoring of the BUEs. Pt. Worked on changing, and alternating forward reverse position every 2 min.  Pt. worked level on level 1.5. Rest breaks were required. Pt. performed gross gripping with grip strengthener. Pt. worked on sustaining grip while grasping pegs and reaching at various heights. Gripper was placed in the 3rd, and 4th resistive slot with the white resistive spring.                          OT Education - 04/12/17 [phone]    Education provided Yes   Education Details RUE strength, and coordination   Person(s) Educated Patient   Methods Explanation   Comprehension Verbalized understanding             OT Long Term Goals - 03/12/17 1228      OT LONG TERM GOAL #1   Title Pt. will independently, and efficiently write 3 sentences with 100% legibility.   Baseline Increased time required. 90% legibility for only 2 words in large print.   Time 12   Period Weeks   Status New     OT LONG TERM GOAL #2   Title Pt. will independently type a paragraph efficiently.   Baseline Pt. is unable   Time 12   Period Weeks   Status New     OT LONG TERM GOAL #3   Title Pt. improve right grip strength by 5 pounds to be able to hold and use a wrench.   Baseline Pt. is unable   Time 12   Period Weeks   Status New     OT LONG TERM GOAL #4   Title Pt. will improve right lateral pinch strength by 3# to be able to put toothpaste on a toothbrush.   Baseline Pt. is unable to apply toothpaste onto a toothbrush.   Time 12   Period Weeks   Status New     OT LONG TERM GOAL #5   Title Pt. will improve FMC by 10 sec. to be able to independently handle, and count change.   Baseline Pt. has difficulty   Time 12   Period Weeks   Status New     Long Term Additional Goals   Additional Long Term Goals Yes     OT LONG TERM GOAL #6   Title Pt. will increase RUE strength by 2mm grades to be able to complete ADLs/IADLs.   Baseline Pt. has difficulty   Time 12   Period Weeks   Status New     OT LONG TERM GOAL #7   Title Pt. will demonstrate independence with visual  compensatory strategies for the right 100% of the time during ADL, and IADL tasks.   Baseline Limited on the right. Pt. occ. misses obstacle on the right.   Time 12  Period Weeks   Status New               Plan - 04/12/17 5621    Clinical Impression Statement Pt. reports he is doing more at home now with his RUE. Pt. reports he has improved with reaching for objects at home with his RUE. Pt. was able to tolerate increased resistance with grip strength. Pt. continues to present with weakness, and fatigue proximally when using his RUE distally.   Occupational performance deficits (Please refer to evaluation for details): ADL's   Rehab Potential Good   OT Frequency 2x / week   OT Duration 12 weeks   OT Treatment/Interventions Self-care/ADL training;Therapeutic exercise;Therapeutic exercises;Therapeutic activities;DME and/or AE instruction;Neuromuscular education;Patient/family education;Functional Mobility Training;Cognitive remediation/compensation;Visual/perceptual remediation/compensation;Energy conservation   Consulted and Agree with Plan of Care Patient;Family member/caregiver      Patient will benefit from skilled therapeutic intervention in order to improve the following deficits and impairments:  Decreased coordination, Decreased balance, Impaired UE functional use, Decreased cognition, Impaired vision/preception, Decreased strength, Decreased knowledge of use of DME, Decreased activity tolerance, Impaired tone, Impaired flexibility, Decreased range of motion, Decreased endurance  Visit Diagnosis: Muscle weakness (generalized)  Other lack of coordination    Problem List Patient Active Problem List   Diagnosis Date Noted  . Hemiparesis affecting right side as late effect of cerebrovascular accident (CVA) (HCC) 03/16/2017  . Aphasia, late effect of cerebrovascular disease 03/16/2017  . Gait disturbance, post-stroke 03/16/2017  . Dysarthria as late effect of stroke  03/16/2017  . Carotid artery stenosis with cerebral infarction (HCC) 03/07/2017  . Language impairment   . ICAO (internal carotid artery occlusion), left 02/22/2017  . Acute respiratory failure (HCC) 02/22/2017  . Obesity 02/22/2017  . Expressive aphasia 02/22/2017  . Acute ischemic left MCA stroke (HCC) 02/22/2017  . Left hemiparesis (HCC)   . Status post total bilateral knee replacement   . Benign essential HTN   . Right-sided extracranial carotid artery stenosis   . Dysphagia, post-stroke   . Acute blood loss anemia   . Nocturia   . Newly diagnosed diabetes (HCC)   . CVA (cerebral vascular accident) (HCC) - L MCA s/p tPA and thrombectomy 02/16/2017  . Anxiety 04/30/2015  . Benign fibroma of prostate 04/30/2015  . Clinical depression 04/30/2015  . Acid reflux 04/30/2015  . HLD (hyperlipidemia) 04/30/2015  . BP (high blood pressure) 04/30/2015  . Arthritis, degenerative 04/30/2015  . 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 Arthritis, septic, knee (HCC) 09/26/2012    Olegario Messier, MS, OTR/L 04/12/2017, 9:54 AM  [facility] MAIN [facility] SERVICES [address] [place], [place], [address] Phone: [phone]   Fax:  [phone]  Name: Erik [name] MRN: [id number] Date of Birth: 1955/12/02

## 2017-04-13 ENCOUNTER — Encounter: Payer: Self-pay | Admitting: Speech Pathology

## 2017-04-13 NOTE — Therapy (Signed)
[place] [facility] MAIN [facility] [address] [place], [place], [address] Phone: [phone]   Fax:  [phone]  Speech Language Pathology Treatment  Patient Details  Name: Erik [name] MRN: [id number] Date of Birth: May 27, 1956 Referring Provider: Charlton AmorANGIULLI, DANIEL J   Encounter Date: 04/12/2017      End of Session - 04/13/17 1337    Visit Number 8   Number of Visits 20   Date for SLP Re-Evaluation 06/12/17   SLP Start Time 1105   SLP Stop Time  1200   SLP Time Calculation (min) 55 min   Activity Tolerance Patient tolerated treatment well      Past Medical History:  Diagnosis Date  . Anxiety   . Arthritis    hands  . BPH (benign prostatic hyperplasia)   . Depression   . 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 ED (erectile dysfunction)   . GERD (gastroesophageal reflux disease)   . H/O calcium pyrophosphate deposition disease (CPPD)   . HLD (hyperlipidemia)   . Hypertension   . Hypogonadism in male   . Lower urinary tract symptoms (LUTS)   . Nocturia   . Stroke [facility](HCC)    Language Impairment.  Left Hemiparesis- improved  . Testosterone overdose   . Vitamin D deficiency     Past Surgical History:  Procedure Laterality Date  . BACK SURGERY    . COLON SURGERY     Colon Resection  . COLONOSCOPY    . IR ANGIO INTRA EXTRACRAN SEL COM CAROTID INNOMINATE UNI L MOD SED  03/07/2017  . IR ANGIO INTRA EXTRACRAN SEL COM CAROTID INNOMINATE UNI R MOD SED  02/16/2017  . IR ANGIO VERTEBRAL SEL SUBCLAVIAN INNOMINATE UNI L MOD SED  03/07/2017  . IR ANGIO VERTEBRAL SEL VERTEBRAL UNI R MOD SED  03/07/2017  . IR INFUSION THROMBOL ARTERIAL INITIAL (MS)  03/07/2017  . IR INTRAVSC STENT CERV CAROTID W/EMB-PROT MOD SED INCL ANGIO  03/07/2017  . IR PERCUTANEOUS ART THROMBECTOMY/INFUSION INTRACRANIAL INC DIAG ANGIO  02/16/2017  . IR PTA NON CORO-LOWER EXTREM  02/16/2017  . IR RADIOLOGIST EVAL & MGMT  04/04/2017  . IR US GUIDE VASC  ACCESS LEFT  02/16/2017  . IR US GUIDE VASC ACCESS RIGHT  02/16/2017  . KNEE ARTHROSCOPY Bilateral   . RADIOLOGY WITH ANESTHESIA N/A 02/16/2017   Procedure: RADIOLOGY WITH ANESTHESIA;  Surgeon: Julieanne CottonSanjeev Deveshwar, MD;  Location: [facility] OR;  Service: Radiology;  Laterality: N/A;  . RADIOLOGY WITH ANESTHESIA N/A 03/07/2017   Procedure: CAROTID STENT;  Surgeon: Julieanne CottonSanjeev Deveshwar, MD;  Location: [facility] OR;  Service: Radiology;  Laterality: N/A;  . ROTATOR CUFF REPAIR Right     There were no vitals filed for this visit.      Subjective Assessment - 04/13/17 1336    Subjective Patient arrived ready to work and showed minimal signs of frustration during the session.   Currently in Pain? No/denies               ADULT SLP TREATMENT - 04/13/17 0001      General Information   Behavior/Cognition Alert;Cooperative;Pleasant mood   HPI 61 year old man with left MCA CVA 02/15/2017.  Patient received inpatient rehab services, including SLP, 02/22/2017-03/02/2017.     Treatment Provided   Treatment provided Cognitive-Linquistic     Pain Assessment   Pain Assessment No/denies pain     Cognitive-Linquistic Treatment   Treatment focused on Aphasia;Dysarthria   Skilled Treatment SPEECH:  Generate sentences to answer general knowledge questions with 85% intelligibility and conversation with 75% using slowed rate.  LANGUAGE: No overt word finding difficulties.     Assessment / Recommendations / Plan   Plan Continue with current plan of care     Progression Toward Goals   Progression toward goals Progressing toward goals          SLP Education - 04/13/17 1336    Education provided Yes   Education Details Use strategies to improve speech intelligibility   Person(s) Educated Patient   Methods Explanation   Comprehension Verbalized understanding            SLP Long Term Goals - 03/13/17 1425      SLP LONG TERM GOAL #1   Title Pt will improve speech intelligibility in response to moderately  complex cognitive linguistic tasks by controlling rate of speech, over-articulation, and increased loudness to achieve 80% intelligibility.   Time 12   Period Weeks   Status New     SLP LONG TERM GOAL #2   Title Patient will read aloud sentences and multi-syllabic words, maintaining phonemic accuracy, with 80% accuracy.     Time 12   Period Weeks   Status New     SLP LONG TERM GOAL #3   Title Patient will demonstrate reading comprehension for paragraphs with 80% accuracy.    Time 12   Period Weeks   Status New     SLP LONG TERM GOAL #4   Title Patient will complete high level word finding tasks with 80% accuracy.   Time 12   Period Weeks   Status New          Plan - 04/13/17 1337    Clinical Impression Statement The patient's conversation abilities are improving as he is slowing his rate of speech and using more content words. He also shows an increased awareness of incorrect phonemic productions of words and was able to self-correct several times during the session. The clinician will focus on encouraging the patient to "over-articulate" his sounds at the word and sentence level.   Speech Therapy Frequency 2x / week   Duration Other (comment)   Treatment/Interventions Patient/family education;Compensatory strategies;SLP instruction and feedback;Functional tasks;Cueing hierarchy   Potential to Achieve Goals Good   Potential Considerations Ability to learn/carryover information;Co-morbidities;Cooperation/participation level;Medical prognosis;Previous level of function;Severity of impairments;Family/community support   SLP Home Exercise Plan Daily conversation with family members   Consulted and Agree with Plan of Care Patient      Patient will benefit from skilled therapeutic intervention in order to improve the following deficits and impairments:   Dysarthria  Aphasia    Problem List Patient Active Problem List   Diagnosis Date Noted  . Hemiparesis affecting right  side as late effect of cerebrovascular accident (CVA) (HCC) 03/16/2017  . Aphasia, late effect of cerebrovascular disease 03/16/2017  . Gait disturbance, post-stroke 03/16/2017  . Dysarthria as late effect of stroke 03/16/2017  . Carotid artery stenosis with cerebral infarction (HCC) 03/07/2017  . Language impairment   . ICAO (internal carotid artery occlusion), left 02/22/2017  . Acute respiratory failure (HCC) 02/22/2017  . Obesity 02/22/2017  . Expressive aphasia 02/22/2017  . Acute ischemic left MCA stroke (HCC) 02/22/2017  . Left hemiparesis (HCC)   . Status post total bilateral knee replacement   . Benign essential HTN   . Right-sided extracranial carotid artery stenosis   . Dysphagia, post-stroke   . Acute blood loss anemia   . Nocturia   .  Newly diagnosed diabetes (HCC)   . CVA (cerebral vascular accident) (HCC) - L MCA s/p tPA and thrombectomy 02/16/2017  . Anxiety 04/30/2015  . Benign fibroma of prostate 04/30/2015  . Clinical depression 04/30/2015  . Acid reflux 04/30/2015  . HLD (hyperlipidemia) 04/30/2015  . BP (high blood pressure) 04/30/2015  . Arthritis, degenerative 04/30/2015  . 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 Arthritis, septic, knee (HCC) 09/26/2012   Dollene Primrose, MS/CCC- SLP  Leandrew Koyanagi 04/13/2017, 1:38 PM  [facility] MAIN [facility] SERVICES [address] [place], [place], [address] Phone: [phone]   Fax:  [phone]   Name: Erik [name] MRN: [id number] Date of Birth: 1956/02/13

## 2017-04-17 ENCOUNTER — Encounter: Payer: Self-pay | Admitting: Speech Pathology

## 2017-04-17 ENCOUNTER — Encounter: Payer: Self-pay | Admitting: Occupational Therapy

## 2017-04-17 ENCOUNTER — Telehealth (HOSPITAL_COMMUNITY): Payer: Self-pay | Admitting: *Deleted

## 2017-04-17 ENCOUNTER — Ambulatory Visit: Payer: Managed Care, Other (non HMO)

## 2017-04-17 ENCOUNTER — Other Ambulatory Visit (HOSPITAL_COMMUNITY): Payer: Self-pay | Admitting: *Deleted

## 2017-04-17 ENCOUNTER — Ambulatory Visit: Payer: Managed Care, Other (non HMO) | Admitting: Occupational Therapy

## 2017-04-17 ENCOUNTER — Ambulatory Visit: Payer: Managed Care, Other (non HMO) | Admitting: Speech Pathology

## 2017-04-17 DIAGNOSIS — M6281 Muscle weakness (generalized): Secondary | ICD-10-CM

## 2017-04-17 DIAGNOSIS — R278 Other lack of coordination: Secondary | ICD-10-CM

## 2017-04-17 DIAGNOSIS — R471 Dysarthria and anarthria: Secondary | ICD-10-CM

## 2017-04-17 DIAGNOSIS — R2681 Unsteadiness on feet: Secondary | ICD-10-CM

## 2017-04-17 DIAGNOSIS — R4701 Aphasia: Secondary | ICD-10-CM

## 2017-04-17 MED ORDER — ASPIRIN 81 MG PO TABS: 81.0000 mg | ORAL_TABLET | Freq: Every day | ORAL | Status: AC

## 2017-04-17 NOTE — Therapy (Signed)
[facility] MAIN [facility] SERVICES [address] [place], [place], [address] Phone: [phone]   Fax:  [phone]  Occupational Therapy Treatment  Patient Details  Name: Erik [name] MRN: [id number] Date of Birth: 18-Jan-1956 Referring Provider: Dr. Hildred Alamin  Encounter Date: 04/17/2017      OT End of Session - 04/17/17 0944    Visit Number 8   Number of Visits 24   Date for OT Re-Evaluation 06/04/17   OT Start Time 0915   OT Stop Time 1000   OT Time Calculation (min) 45 min   Activity Tolerance Patient tolerated treatment well   Behavior During Therapy [facility] for tasks assessed/performed      Past Medical History:  Diagnosis Date  . Anxiety   . Arthritis    hands  . BPH (benign prostatic hyperplasia)   . Depression   . 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 ED (erectile dysfunction)   . GERD (gastroesophageal reflux disease)   . H/O calcium pyrophosphate deposition disease (CPPD)   . HLD (hyperlipidemia)   . Hypertension   . Hypogonadism in male   . Lower urinary tract symptoms (LUTS)   . Nocturia   . Stroke [facility])    Language Impairment.  Left Hemiparesis- improved  . Testosterone overdose   . Vitamin D deficiency     Past Surgical History:  Procedure Laterality Date  . BACK SURGERY    . COLON SURGERY     Colon Resection  . COLONOSCOPY    . IR ANGIO INTRA EXTRACRAN SEL COM CAROTID INNOMINATE UNI L MOD SED  03/07/2017  . IR ANGIO INTRA EXTRACRAN SEL COM CAROTID INNOMINATE UNI R MOD SED  02/16/2017  . IR ANGIO VERTEBRAL SEL SUBCLAVIAN INNOMINATE UNI L MOD SED  03/07/2017  . IR ANGIO VERTEBRAL SEL VERTEBRAL UNI R MOD SED  03/07/2017  . IR INFUSION THROMBOL ARTERIAL INITIAL (MS)  03/07/2017  . IR INTRAVSC STENT CERV CAROTID W/EMB-PROT MOD SED INCL ANGIO  03/07/2017  . IR PERCUTANEOUS ART THROMBECTOMY/INFUSION INTRACRANIAL INC DIAG ANGIO  02/16/2017  . IR PTA NON CORO-LOWER EXTREM  02/16/2017  . IR RADIOLOGIST  EVAL & MGMT  04/04/2017  . IR US GUIDE VASC ACCESS LEFT  02/16/2017  . IR US GUIDE VASC ACCESS RIGHT  02/16/2017  . KNEE ARTHROSCOPY Bilateral   . RADIOLOGY WITH ANESTHESIA N/A 02/16/2017   Procedure: RADIOLOGY WITH ANESTHESIA;  Surgeon: Julieanne Cotton, MD;  Location: [facility] OR;  Service: Radiology;  Laterality: N/A;  . RADIOLOGY WITH ANESTHESIA N/A 03/07/2017   Procedure: CAROTID STENT;  Surgeon: Julieanne Cotton, MD;  Location: [facility] OR;  Service: Radiology;  Laterality: N/A;  . ROTATOR CUFF REPAIR Right     There were no vitals filed for this visit.      Subjective Assessment - 04/17/17 0920    Subjective  Pt. reports he has Rright shoulder stiffness from athritis.   Patient is accompained by: Family member   Pertinent History Pt. is a 61 y.o. male who had a CVA on the evening of April 12th, 2018. Pt. had been to his grandson's ball game, and was preparing for bed when he felling getting into bed. According to his wife, he struggled to get up. She could tell he was having a stroke, and called EMS. Pt. was transported to [facility],, was administered TPA,. Pt. underwent a cerebral angiogram with Rt ICA stent assisted angioplasty. Pt. received Therapy services in CIR. at [facility].  Currently in Pain? No/denies        OT TREATMENT    Neuro muscular re-education:  Pt. worked on grasping, flipping, turning, and stacking [place] manual dexterity discs. Pt. required visual demonstration, and cues for movement patterns. Pt. worked on speed, and Engineer, agriculturalcoordination skills.   Therapeutic Exercise:  Pt. Worked on the [employer] for 8 min. With constant monitoring of the BUEs. Pt. Worked on changing, and alternating forward reverse position every 2 min. Rest breaks were required. Pt. performed 2# dowel ex. For UE strengthening secondary to weakness. Bilateral shoulder flexion, chest press, circular patterns, and elbow flexion/extension were performed. 3# dumbbell ex. for elbow flexion and extension,  forearm supination/pronation, wrist flexion/extension, and radial deviation. Pt. requires rest breaks and verbal cues for proper technique. Pt. performed gross gripping with grip strengthener. Pt. worked on sustaining grip while grasping pegs and reaching at various heights. Gripper was placed in the 3rd, 4th, and 5th resistive slot with the white resistive spring.                           OT Education - 04/17/17 [phone]    Education provided Yes   Education Details UE strength, and coordination skills   Person(s) Educated Patient   Methods Explanation   Comprehension Verbalized understanding             OT Long Term Goals - 03/12/17 1228      OT LONG TERM GOAL #1   Title Pt. will independently, and efficiently write 3 sentences with 100% legibility.   Baseline Increased time required. 90% legibility for only 2 words in large print.   Time 12   Period Weeks   Status New     OT LONG TERM GOAL #2   Title Pt. will independently type a paragraph efficiently.   Baseline Pt. is unable   Time 12   Period Weeks   Status New     OT LONG TERM GOAL #3   Title Pt. improve right grip strength by 5 pounds to be able to hold and use a wrench.   Baseline Pt. is unable   Time 12   Period Weeks   Status New     OT LONG TERM GOAL #4   Title Pt. will improve right lateral pinch strength by 3# to be able to put toothpaste on a toothbrush.   Baseline Pt. is unable to apply toothpaste onto a toothbrush.   Time 12   Period Weeks   Status New     OT LONG TERM GOAL #5   Title Pt. will improve FMC by 10 sec. to be able to independently handle, and count change.   Baseline Pt. has difficulty   Time 12   Period Weeks   Status New     Long Term Additional Goals   Additional Long Term Goals Yes     OT LONG TERM GOAL #6   Title Pt. will increase RUE strength by 2mm grades to be able to complete ADLs/IADLs.   Baseline Pt. has difficulty   Time 12   Period Weeks    Status New     OT LONG TERM GOAL #7   Title Pt. will demonstrate independence with visual compensatory strategies for the right 100% of the time during ADL, and IADL tasks.   Baseline Limited on the right. Pt. occ. misses obstacle on the right.   Time 12   Period Weeks   Status New  Plan - 04/17/17 0949    Clinical Impression Statement Pt. continues to present with RUE weakness, limited motor control, and coordination skills.  Pt. continues to work on improving RUE functioning for use during ADL, and IADL tasks. Pt. Was able  To tolerate increased grip strength resistance.   Occupational performance deficits (Please refer to evaluation for details): ADL's   Rehab Potential Good   OT Frequency 2x / week   OT Duration 12 weeks   OT Treatment/Interventions Self-care/ADL training;Therapeutic exercise;Therapeutic exercises;Therapeutic activities;DME and/or AE instruction;Neuromuscular education;Patient/family education;Functional Mobility Training;Cognitive remediation/compensation;Visual/perceptual remediation/compensation;Energy conservation   Consulted and Agree with Plan of Care Patient;Family member/caregiver      Patient will benefit from skilled therapeutic intervention in order to improve the following deficits and impairments:  Decreased coordination, Decreased balance, Impaired UE functional use, Decreased cognition, Impaired vision/preception, Decreased strength, Decreased knowledge of use of DME, Decreased activity tolerance, Impaired tone, Impaired flexibility, Decreased range of motion, Decreased endurance  Visit Diagnosis: Muscle weakness (generalized)  Other lack of coordination    Problem List Patient Active Problem List   Diagnosis Date Noted  . Hemiparesis affecting right side as late effect of cerebrovascular accident (CVA) (HCC) 03/16/2017  . Aphasia, late effect of cerebrovascular disease 03/16/2017  . Gait disturbance, post-stroke 03/16/2017   . Dysarthria as late effect of stroke 03/16/2017  . Carotid artery stenosis with cerebral infarction (HCC) 03/07/2017  . Language impairment   . ICAO (internal carotid artery occlusion), left 02/22/2017  . Acute respiratory failure (HCC) 02/22/2017  . Obesity 02/22/2017  . Expressive aphasia 02/22/2017  . Acute ischemic left MCA stroke (HCC) 02/22/2017  . Left hemiparesis (HCC)   . Status post total bilateral knee replacement   . Benign essential HTN   . Right-sided extracranial carotid artery stenosis   . Dysphagia, post-stroke   . Acute blood loss anemia   . Nocturia   . Newly diagnosed diabetes (HCC)   . CVA (cerebral vascular accident) (HCC) - L MCA s/p tPA and thrombectomy 02/16/2017  . Anxiety 04/30/2015  . Benign fibroma of prostate 04/30/2015  . Clinical depression 04/30/2015  . Acid reflux 04/30/2015  . HLD (hyperlipidemia) 04/30/2015  . BP (high blood pressure) 04/30/2015  . Arthritis, degenerative 04/30/2015  . 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 Arthritis, septic, knee (HCC) 09/26/2012    Olegario Messier, MS, OTR/L 04/17/2017, 9:55 AM  [facility] MAIN [facility] SERVICES [address] [place], [place], [address] Phone: [phone]   Fax:  [phone]  Name: Erik [name] MRN: [id number] Date of Birth: 11/17/1955

## 2017-04-17 NOTE — Therapy (Signed)
[facility] MAIN [facility] SERVICES [address] [place], [place], [address] Phone: [phone]   Fax:  [phone]  Physical Therapy Treatment  Patient Details  Name: Erik [name] MRN: [id number] Date of Birth: 1956/04/19 Referring Provider: Cruzita Lederer  Encounter Date: 04/17/2017      PT End of Session - 04/17/17 1100    Visit Number 10   Number of Visits 17   Date for PT Re-Evaluation 05/09/17   PT Start Time 1003   PT Stop Time 1048   PT Time Calculation (min) 45 min   Equipment Utilized During Treatment Other (comment);Gait belt  S-min A prn   Activity Tolerance Patient tolerated treatment well   Behavior During Therapy [facility] for tasks assessed/performed      Past Medical History:  Diagnosis Date  . Anxiety   . Arthritis    hands  . BPH (benign prostatic hyperplasia)   . Depression   . 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 ED (erectile dysfunction)   . GERD (gastroesophageal reflux disease)   . H/O calcium pyrophosphate deposition disease (CPPD)   . HLD (hyperlipidemia)   . Hypertension   . Hypogonadism in male   . Lower urinary tract symptoms (LUTS)   . Nocturia   . Stroke [facility])    Language Impairment.  Left Hemiparesis- improved  . Testosterone overdose   . Vitamin D deficiency     Past Surgical History:  Procedure Laterality Date  . BACK SURGERY    . COLON SURGERY     Colon Resection  . COLONOSCOPY    . IR ANGIO INTRA EXTRACRAN SEL COM CAROTID INNOMINATE UNI L MOD SED  03/07/2017  . IR ANGIO INTRA EXTRACRAN SEL COM CAROTID INNOMINATE UNI R MOD SED  02/16/2017  . IR ANGIO VERTEBRAL SEL SUBCLAVIAN INNOMINATE UNI L MOD SED  03/07/2017  . IR ANGIO VERTEBRAL SEL VERTEBRAL UNI R MOD SED  03/07/2017  . IR INFUSION THROMBOL ARTERIAL INITIAL (MS)  03/07/2017  . IR INTRAVSC STENT CERV CAROTID W/EMB-PROT MOD SED INCL ANGIO  03/07/2017  . IR PERCUTANEOUS ART THROMBECTOMY/INFUSION INTRACRANIAL INC DIAG  ANGIO  02/16/2017  . IR PTA NON CORO-LOWER EXTREM  02/16/2017  . IR RADIOLOGIST EVAL & MGMT  04/04/2017  . IR US GUIDE VASC ACCESS LEFT  02/16/2017  . IR US GUIDE VASC ACCESS RIGHT  02/16/2017  . KNEE ARTHROSCOPY Bilateral   . RADIOLOGY WITH ANESTHESIA N/A 02/16/2017   Procedure: RADIOLOGY WITH ANESTHESIA;  Surgeon: Julieanne Cotton, MD;  Location: [facility] OR;  Service: Radiology;  Laterality: N/A;  . RADIOLOGY WITH ANESTHESIA N/A 03/07/2017   Procedure: CAROTID STENT;  Surgeon: Julieanne Cotton, MD;  Location: [facility] OR;  Service: Radiology;  Laterality: N/A;  . ROTATOR CUFF REPAIR Right     There were no vitals filed for this visit.      Subjective Assessment - 04/17/17 1059    Subjective Pt. has been continuing his walks between sessions and reports no trips or falls.    Pertinent History Pt. is a 61 y.o. male who had a CVA on the evening of April 12th, 2018. Pt had been to his grandson's ball game that day and watched a movie with his wife. He got in bed and his wife went to take a bath. When she came out pt was on the floor struggling to get back in bed. Pt had become weak while in bed, tried to sit up and fell out  of the bed. She could tell he was having a stroke, and called EMS. Pt was [facility] and CTA head/neck revealed hyperdense L-MCA sign likely indicating acute thrombus. He received tPA and he underwent cerebral angiogram with L-MCA thrombectomy, dense calcified plaque L-ICA precluded stent placement and noted to have 73% stenosis R-ICA due to advanced atherosclerotic changes. Patient with resultant facial weakness with mild dysarthria, aphasia, mild left gaze preference and right sided weakness affecting mobility and ability to carry out ADL tasks. He was in CIR for approximately 8 days per patient and then discharged home. He has been evaluated by outpatient speech and occupational therapy and is now here with wife today for PT evaluation.    Patient Stated Goals Improve his  strength and balance in order to improve his function at home and be able to return to work.    Currently in Pain? No/denies    TherEx TRX squat 2x10 with RTB TRX lunges 8x each side Ankle inversion 2x10 GTB with manual cues to not IR thigh for compensation without df assist device Shoulder AAROM/PROM in supine flexion, abduction, hor add/abd, ER, IR with scapular overpressure in flexion and abduction. Rhythmic rotation of RUE due to UE influence in balance and mobility.  Picking up marbles with toes 2x 8x,, weak foot intrinsics, requires multiple trials.  Neuro re-ed Bosu ball: blue side up balance x 60 seconds Bosu ball: blue side up, lunges x10 each leg Bosu ball: blue side up: side step lunges 10x each leg  Tandem stance: airex pad and yellow balance disc airex pad tandem stance with rotating twists with 9lb weighted bar 2x 60 seconds Half roller balance with 9lb weighted bar flexion raises 10x    Pt. response to medical necessity:  Patient will continue to benefit from skilled physical therapy to improve strength, balance, and gait to improve safety during functional mobility.           PT Long Term Goals - 03/14/17 1513      PT LONG TERM GOAL #1   Title Pt will be independent with HEP in order to improve strength and balance in order to decrease fall risk and improve function at home and work.    Time 8   Period Weeks   Status New     PT LONG TERM GOAL #2   Title Pt will improve BERG by at least 3 points in order to demonstrate clinically significant improvement in balance.   Baseline 03/14/17: 49/56   Time 8   Period Weeks   Status New     PT LONG TERM GOAL #3   Title Pt will improve DGI by at least 3 points in order to demonstrate clinically significant improvement in balance and decreased risk for falls    Baseline 03/14/17: 17/24   Time 8   Period Weeks   Status New     PT LONG TERM GOAL #4   Title Pt will improve R hip abduction, adduction, and flexion  strength by at least 1/2 MMT in order to improve gait    Baseline 03/14/17: see flowsheet   Time 8   Status New               Plan - 04/17/17 1105    Clinical Impression Statement Patient presents with increased strength and mobility of RLE. Progression of TRX was performed with tactile cueing required for proper bony alignment. RLE has tendency for eversion when ambulating and under weight demands that is  corrected with tactile and verbal cues. Combining UE and LE dynamic motions allows for functional challenges of balance and ambulation for return to previous level of function. Patient will continue to benefit from skilled physical therapy to improve strength, balance, and gait to improve safety during functional mobility.    Rehab Potential Good   Clinical Impairments Affecting Rehab Potential Positive: age, motivation, social support; Negative: significant ICA stenosis   PT Frequency 2x / week   PT Duration 8 weeks   PT Treatment/Interventions ADLs/Self Care Home Management;Aquatic Therapy;Biofeedback;Cryotherapy;Electrical Stimulation;Iontophoresis 4mg /ml Dexamethasone;Moist Heat;Ultrasound;DME Instruction;Gait training;Stair training;Functional mobility training;Therapeutic activities;Therapeutic exercise;Balance training;Neuromuscular re-education;Patient/family education;Manual techniques;Passive range of motion;Energy conservation   PT Next Visit Plan dynamic balance, trx   PT Home Exercise Plan Hip 3 ways with RTB, mini squats, RTB resisted R ankle DF, toe taps, semitandem/tandem balance; Bil concentric and eccentric R calf raises; R hamstring curls wiith GTB   Consulted and Agree with Plan of Care Patient      Patient will benefit from skilled therapeutic intervention in order to improve the following deficits and impairments:  Abnormal gait, Decreased balance, Decreased coordination, Decreased mobility, Decreased strength, Difficulty walking  Visit Diagnosis: Muscle weakness  (generalized)  Other lack of coordination  Unsteadiness on feet     Problem List Patient Active Problem List   Diagnosis Date Noted  . Hemiparesis affecting right side as late effect of cerebrovascular accident (CVA) (HCC) 03/16/2017  . Aphasia, late effect of cerebrovascular disease 03/16/2017  . Gait disturbance, post-stroke 03/16/2017  . Dysarthria as late effect of stroke 03/16/2017  . Carotid artery stenosis with cerebral infarction (HCC) 03/07/2017  . Language impairment   . ICAO (internal carotid artery occlusion), left 02/22/2017  . Acute respiratory failure (HCC) 02/22/2017  . Obesity 02/22/2017  . Expressive aphasia 02/22/2017  . Acute ischemic left MCA stroke (HCC) 02/22/2017  . Left hemiparesis (HCC)   . Status post total bilateral knee replacement   . Benign essential HTN   . Right-sided extracranial carotid artery stenosis   . Dysphagia, post-stroke   . Acute blood loss anemia   . Nocturia   . Newly diagnosed diabetes (HCC)   . CVA (cerebral vascular accident) (HCC) - L MCA s/p tPA and thrombectomy 02/16/2017  . Anxiety 04/30/2015  . Benign fibroma of prostate 04/30/2015  . Clinical depression 04/30/2015  . Acid reflux 04/30/2015  . HLD (hyperlipidemia) 04/30/2015  . BP (high blood pressure) 04/30/2015  . Arthritis, degenerative 04/30/2015  . 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 Arthritis, septic, knee (HCC) 09/26/2012  Precious Bard, PT, DPT    04/17/2017, 11:07 AM  [place] [facility] MAIN [facility] SERVICES [address] [place], [place], [address] Phone: [phone]   Fax:  [phone]  Name: Erik [name] MRN: [id number] Date of Birth: [birth year]

## 2017-04-17 NOTE — Therapy (Signed)
[facility] MAIN [facility] SERVICES [address] [place], [place], [address] Phone: [phone]   Fax:  [phone]  Speech Language Pathology Treatment  Patient Details  Name: Erik [name] MRN: [id number] Date of Birth: 12/26/1955 Referring Provider: Charlton Amor   Encounter Date: 04/17/2017      End of Session - 04/17/17 1306    Visit Number 9   Number of Visits 20   Date for SLP Re-Evaluation 06/12/17   SLP Start Time 1055   SLP Stop Time  1155   SLP Time Calculation (min) 60 min   Activity Tolerance Patient tolerated treatment well      Past Medical History:  Diagnosis Date  . Anxiety   . Arthritis    hands  . BPH (benign prostatic hyperplasia)   . Depression   . 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 ED (erectile dysfunction)   . GERD (gastroesophageal reflux disease)   . H/O calcium pyrophosphate deposition disease (CPPD)   . HLD (hyperlipidemia)   . Hypertension   . Hypogonadism in male   . Lower urinary tract symptoms (LUTS)   . Nocturia   . Stroke [facility])    Language Impairment.  Left Hemiparesis- improved  . Testosterone overdose   . Vitamin D deficiency     Past Surgical History:  Procedure Laterality Date  . BACK SURGERY    . COLON SURGERY     Colon Resection  . COLONOSCOPY    . IR ANGIO INTRA EXTRACRAN SEL COM CAROTID INNOMINATE UNI L MOD SED  03/07/2017  . IR ANGIO INTRA EXTRACRAN SEL COM CAROTID INNOMINATE UNI R MOD SED  02/16/2017  . IR ANGIO VERTEBRAL SEL SUBCLAVIAN INNOMINATE UNI L MOD SED  03/07/2017  . IR ANGIO VERTEBRAL SEL VERTEBRAL UNI R MOD SED  03/07/2017  . IR INFUSION THROMBOL ARTERIAL INITIAL (MS)  03/07/2017  . IR INTRAVSC STENT CERV CAROTID W/EMB-PROT MOD SED INCL ANGIO  03/07/2017  . IR PERCUTANEOUS ART THROMBECTOMY/INFUSION INTRACRANIAL INC DIAG ANGIO  02/16/2017  . IR PTA NON CORO-LOWER EXTREM  02/16/2017  . IR RADIOLOGIST EVAL & MGMT  04/04/2017  . IR US GUIDE VASC  ACCESS LEFT  02/16/2017  . IR US GUIDE VASC ACCESS RIGHT  02/16/2017  . KNEE ARTHROSCOPY Bilateral   . RADIOLOGY WITH ANESTHESIA N/A 02/16/2017   Procedure: RADIOLOGY WITH ANESTHESIA;  Surgeon: Julieanne Cotton, MD;  Location: [facility] OR;  Service: Radiology;  Laterality: N/A;  . RADIOLOGY WITH ANESTHESIA N/A 03/07/2017   Procedure: CAROTID STENT;  Surgeon: Julieanne Cotton, MD;  Location: [facility] OR;  Service: Radiology;  Laterality: N/A;  . ROTATOR CUFF REPAIR Right     There were no vitals filed for this visit.      Subjective Assessment - 04/17/17 1305    Subjective Patient was alert and worked hard throughout the session.   Currently in Pain? No/denies               ADULT SLP TREATMENT - 04/17/17 0001      General Information   Behavior/Cognition Alert;Cooperative;Pleasant mood   HPI 61 year old man with left MCA CVA 02/15/2017.  Patient received inpatient rehab services, including SLP, 02/22/2017-03/02/2017.     Treatment Provided   Treatment provided Cognitive-Linquistic     Pain Assessment   Pain Assessment No/denies pain     Cognitive-Linquistic Treatment   Treatment focused on Aphasia;Dysarthria   Skilled Treatment  Patient completed daily skills and  functional skills explanation tasks with 70% intelligibility. He required attention redirection, as well as skilled cueing and scaffolding from the clinician to aid him in word finding and in providing adequate content words. The patient required minimal cueing to slow down his rate of speech and enunciate his words. The patient required maximal cueing from the clinician to complete a task on words with multiple meanings, and was unable to perform the task independently.     Assessment / Recommendations / Plan   Plan Continue with current plan of care     Progression Toward Goals   Progression toward goals Progressing toward goals          SLP Education - 04/17/17 1305    Education provided Yes   Education Details Oral  motor exercises and how they are not proven to be effective for speech   Person(s) Educated Patient   Methods Explanation;Demonstration   Comprehension Verbalized understanding;Returned demonstration            SLP Long Term Goals - 03/13/17 1425      SLP LONG TERM GOAL #1   Title Pt will improve speech intelligibility in response to moderately complex cognitive linguistic tasks by controlling rate of speech, over-articulation, and increased loudness to achieve 80% intelligibility.   Time 12   Period Weeks   Status New     SLP LONG TERM GOAL #2   Title Patient will read aloud sentences and multi-syllabic words, maintaining phonemic accuracy, with 80% accuracy.     Time 12   Period Weeks   Status New     SLP LONG TERM GOAL #3   Title Patient will demonstrate reading comprehension for paragraphs with 80% accuracy.    Time 12   Period Weeks   Status New     SLP LONG TERM GOAL #4   Title Patient will complete high level word finding tasks with 80% accuracy.   Time 12   Period Weeks   Status New          Plan - 04/17/17 1307    Clinical Impression Statement The patient requires less cueing to slow his rate of speech and use adequate content words in functional tasks. The patient was able to give detailed instructions to complete hypothetical tasks and was able to self-correct incorrectly articulated words several times during the session. The clinician will focus on encouraging the patient to "over-articulate" his sounds at conversation level.    Speech Therapy Frequency 2x / week   Duration Other (comment)   Treatment/Interventions Patient/family education;Compensatory strategies;SLP instruction and feedback;Functional tasks;Cueing hierarchy   Potential to Achieve Goals Good   Potential Considerations Ability to learn/carryover information;Co-morbidities;Cooperation/participation level;Medical prognosis;Previous level of function;Severity of impairments;Family/community  support   SLP Home Exercise Plan Daily conversation with family members   Consulted and Agree with Plan of Care Patient      Patient will benefit from skilled therapeutic intervention in order to improve the following deficits and impairments:   Dysarthria  Aphasia    Problem List Patient Active Problem List   Diagnosis Date Noted  . Hemiparesis affecting right side as late effect of cerebrovascular accident (CVA) (HCC) 03/16/2017  . Aphasia, late effect of cerebrovascular disease 03/16/2017  . Gait disturbance, post-stroke 03/16/2017  . Dysarthria as late effect of stroke 03/16/2017  . Carotid artery stenosis with cerebral infarction (HCC) 03/07/2017  . Language impairment   . ICAO (internal carotid artery occlusion), left 02/22/2017  . Acute respiratory failure (HCC) 02/22/2017  .  Obesity 02/22/2017  . Expressive aphasia 02/22/2017  . Acute ischemic left MCA stroke (HCC) 02/22/2017  . Left hemiparesis (HCC)   . Status post total bilateral knee replacement   . Benign essential HTN   . Right-sided extracranial carotid artery stenosis   . Dysphagia, post-stroke   . Acute blood loss anemia   . Nocturia   . Newly diagnosed diabetes (HCC)   . CVA (cerebral vascular accident) (HCC) - L MCA s/p tPA and thrombectomy 02/16/2017  . Anxiety 04/30/2015  . Benign fibroma of prostate 04/30/2015  . Clinical depression 04/30/2015  . Acid reflux 04/30/2015  . HLD (hyperlipidemia) 04/30/2015  . BP (high blood pressure) 04/30/2015  . Arthritis, degenerative 04/30/2015  . 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 Arthritis, septic, knee (HCC) 09/26/2012    Clydene Pugh 04/17/2017, 1:08 PM  [facility] MAIN [facility] SERVICES [address] [place], [place], [address] Phone: [phone]   Fax:  [phone]   Name: Erik [name] MRN: [id number] Date of Birth: 07/07/1956

## 2017-04-17 NOTE — Telephone Encounter (Signed)
Called and left message for wife.  Per Dr. Corliss Skainseveshwar patient is to continue medication as he is taking currently.  We have that he is taking Brilinta 90mg  BID and ASA 325 mg daily.  Left phone number for her to call back if questions.

## 2017-04-19 ENCOUNTER — Ambulatory Visit: Payer: Managed Care, Other (non HMO)

## 2017-04-19 ENCOUNTER — Ambulatory Visit: Payer: Managed Care, Other (non HMO) | Admitting: Speech Pathology

## 2017-04-19 ENCOUNTER — Encounter: Payer: Self-pay | Admitting: Speech Pathology

## 2017-04-19 ENCOUNTER — Ambulatory Visit: Payer: Managed Care, Other (non HMO) | Admitting: Occupational Therapy

## 2017-04-19 DIAGNOSIS — M6281 Muscle weakness (generalized): Secondary | ICD-10-CM

## 2017-04-19 DIAGNOSIS — R278 Other lack of coordination: Secondary | ICD-10-CM

## 2017-04-19 DIAGNOSIS — R471 Dysarthria and anarthria: Secondary | ICD-10-CM

## 2017-04-19 DIAGNOSIS — R4701 Aphasia: Secondary | ICD-10-CM

## 2017-04-19 NOTE — Therapy (Signed)
[facility] MAIN [facility] SERVICES [address] [place], [place], [address] Phone: [phone]   Fax:  [phone]  Occupational Therapy Treatment  Patient Details  Name: Erik [name] MRN: [id number] Date of Birth: 08/28/1956 Referring Provider: Dr. Hildred Alamin  Encounter Date: 04/19/2017      OT End of Session - 04/19/17 0933    Visit Number 9   Number of Visits 24   Date for OT Re-Evaluation 06/04/17   OT Start Time 0915   OT Stop Time 1000   OT Time Calculation (min) 45 min   Activity Tolerance Patient tolerated treatment well   Behavior During Therapy [facility] for tasks assessed/performed      Past Medical History:  Diagnosis Date  . Anxiety   . Arthritis    hands  . BPH (benign prostatic hyperplasia)   . Depression   . 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 ED (erectile dysfunction)   . GERD (gastroesophageal reflux disease)   . H/O calcium pyrophosphate deposition disease (CPPD)   . HLD (hyperlipidemia)   . Hypertension   . Hypogonadism in male   . Lower urinary tract symptoms (LUTS)   . Nocturia   . Stroke [facility])    Language Impairment.  Left Hemiparesis- improved  . Testosterone overdose   . Vitamin D deficiency     Past Surgical History:  Procedure Laterality Date  . BACK SURGERY    . COLON SURGERY     Colon Resection  . COLONOSCOPY    . IR ANGIO INTRA EXTRACRAN SEL COM CAROTID INNOMINATE UNI L MOD SED  03/07/2017  . IR ANGIO INTRA EXTRACRAN SEL COM CAROTID INNOMINATE UNI R MOD SED  02/16/2017  . IR ANGIO VERTEBRAL SEL SUBCLAVIAN INNOMINATE UNI L MOD SED  03/07/2017  . IR ANGIO VERTEBRAL SEL VERTEBRAL UNI R MOD SED  03/07/2017  . IR INFUSION THROMBOL ARTERIAL INITIAL (MS)  03/07/2017  . IR INTRAVSC STENT CERV CAROTID W/EMB-PROT MOD SED INCL ANGIO  03/07/2017  . IR PERCUTANEOUS ART THROMBECTOMY/INFUSION INTRACRANIAL INC DIAG ANGIO  02/16/2017  . IR PTA NON CORO-LOWER EXTREM  02/16/2017  . IR RADIOLOGIST  EVAL & MGMT  04/04/2017  . IR US GUIDE VASC ACCESS LEFT  02/16/2017  . IR US GUIDE VASC ACCESS RIGHT  02/16/2017  . KNEE ARTHROSCOPY Bilateral   . RADIOLOGY WITH ANESTHESIA N/A 02/16/2017   Procedure: RADIOLOGY WITH ANESTHESIA;  Surgeon: Julieanne Cotton, MD;  Location: [facility] OR;  Service: Radiology;  Laterality: N/A;  . RADIOLOGY WITH ANESTHESIA N/A 03/07/2017   Procedure: CAROTID STENT;  Surgeon: Julieanne Cotton, MD;  Location: [facility] OR;  Service: Radiology;  Laterality: N/A;  . ROTATOR CUFF REPAIR Right     There were no vitals filed for this visit.      Subjective Assessment - 04/19/17 0931    Subjective  Pt. reports he yawns alot since having the stroke.   Patient is accompained by: Erik [name]   Pertinent History Pt. is a 61 y.o. male who had a CVA on the evening of April 12th, 2018. Pt. had been to his grandson's ball game, and was preparing for bed when he felling getting into bed. According to his wife, he struggled to get up. She could tell he was having a stroke, and called EMS. Pt. was transported to [facility],, was administered TPA,. Pt. underwent a cerebral angiogram with Rt ICA stent assisted angioplasty. Pt. received Therapy services in CIR. at [facility].  Currently in Pain? No/denies      OT TREATMENT    Neuro muscular re-education:  Pt. performed [facility] skills training to improve speed and dexterity needed for ADL tasks and writing. Pt. demonstrated grasping 1 inch sticks, and  inch cylindrical collars on the [employer] pegboard. Pt. performed grasping each item with her 2nd digit and thumb, and storing them in the palm. Pt. presented with difficulty storing  inch objects at a time in the palmar aspect of his hand. Pt. Reports his right hand is very stiff.  Therapeutic Exercise:  Pt. worked on the [employer] for 8 min. with constant monitoring of the BUEs. Pt. worked on changing, and alternating forward reverse position every 2 min. Rest breaks were required. Pt. performed 2#  dowel ex. For UE strengthening secondary to weakness. Bilateral shoulder flexion, chest press, circular patterns, and elbow flexion/extension were performed. 3# dumbbell ex. for elbow flexion and extension, forearm supination/pronation, wrist flexion/extension, and radial deviation. Pt. requires rest breaks and verbal cues for proper technique.Pt. performed gross gripping with grip strengthener. Pt. worked on sustaining grip while grasping pegs and reaching at various heights. Gripper was placed in the 3rd resistive slot with the white resistive spring.                           OT Education - 04/19/17 0932    Education provided Yes   Education Details RUE strength, and coordination skills.   Person(s) Educated Patient   Methods Explanation;Demonstration   Comprehension Verbalized understanding;Returned demonstration             OT Long Term Goals - 03/12/17 1228      OT LONG TERM GOAL #1   Title Pt. will independently, and efficiently write 3 sentences with 100% legibility.   Baseline Increased time required. 90% legibility for only 2 words in large print.   Time 12   Period Weeks   Status New     OT LONG TERM GOAL #2   Title Pt. will independently type a paragraph efficiently.   Baseline Pt. is unable   Time 12   Period Weeks   Status New     OT LONG TERM GOAL #3   Title Pt. improve right grip strength by 5 pounds to be able to hold and use a wrench.   Baseline Pt. is unable   Time 12   Period Weeks   Status New     OT LONG TERM GOAL #4   Title Pt. will improve right lateral pinch strength by 3# to be able to put toothpaste on a toothbrush.   Baseline Pt. is unable to apply toothpaste onto a toothbrush.   Time 12   Period Weeks   Status New     OT LONG TERM GOAL #5   Title Pt. will improve FMC by 10 sec. to be able to independently handle, and count change.   Baseline Pt. has difficulty   Time 12   Period Weeks   Status New     Long Term  Additional Goals   Additional Long Term Goals Yes     OT LONG TERM GOAL #6   Title Pt. will increase RUE strength by 2mm grades to be able to complete ADLs/IADLs.   Baseline Pt. has difficulty   Time 12   Period Weeks   Status New     OT LONG TERM GOAL #7   Title Pt. will demonstrate independence with visual  compensatory strategies for the right 100% of the time during ADL, and IADL tasks.   Baseline Limited on the right. Pt. occ. misses obstacle on the right.   Time 12   Period Weeks   Status New               Plan - 04/19/17 0934    Clinical Impression Statement Pt. reports having fatigue, and an increase with yawning since the onset of the stroke. Pt. has retruned to driving this week, and made his first solo trip today when de drove himself to therapy. Pt. continues to work on improving RUE ROM, strength, motor control, and coordination skills.   Occupational performance deficits (Please refer to evaluation for details): ADL's   Rehab Potential Good   OT Frequency 2x / week   OT Treatment/Interventions Self-care/ADL training;Therapeutic exercise;Therapeutic exercises;Therapeutic activities;DME and/or AE instruction;Neuromuscular education;Patient/Erik education;Functional Mobility Training;Cognitive remediation/compensation;Visual/perceptual remediation/compensation;Energy conservation   Consulted and Agree with Plan of Care Patient;Erik [name]/caregiver      Patient will benefit from skilled therapeutic intervention in order to improve the following deficits and impairments:  Decreased coordination, Decreased balance, Impaired UE functional use, Decreased cognition, Impaired vision/preception, Decreased strength, Decreased knowledge of use of DME, Decreased activity tolerance, Impaired tone, Impaired flexibility, Decreased range of motion, Decreased endurance  Visit Diagnosis: Muscle weakness (generalized)  Other lack of coordination    Problem List Patient  Active Problem List   Diagnosis Date Noted  . Hemiparesis affecting right side as late effect of cerebrovascular accident (CVA) (HCC) 03/16/2017  . Aphasia, late effect of cerebrovascular disease 03/16/2017  . Gait disturbance, post-stroke 03/16/2017  . Dysarthria as late effect of stroke 03/16/2017  . Carotid artery stenosis with cerebral infarction (HCC) 03/07/2017  . Language impairment   . ICAO (internal carotid artery occlusion), left 02/22/2017  . Acute respiratory failure (HCC) 02/22/2017  . Obesity 02/22/2017  . Expressive aphasia 02/22/2017  . Acute ischemic left MCA stroke (HCC) 02/22/2017  . Left hemiparesis (HCC)   . Status post total bilateral knee replacement   . Benign essential HTN   . Right-sided extracranial carotid artery stenosis   . Dysphagia, post-stroke   . Acute blood loss anemia   . Nocturia   . Newly diagnosed diabetes (HCC)   . CVA (cerebral vascular accident) (HCC) - L MCA s/p tPA and thrombectomy 02/16/2017  . Anxiety 04/30/2015  . Benign fibroma of prostate 04/30/2015  . Clinical depression 04/30/2015  . Acid reflux 04/30/2015  . HLD (hyperlipidemia) 04/30/2015  . BP (high blood pressure) 04/30/2015  . Arthritis, degenerative 04/30/2015  . 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 Arthritis, septic, knee (HCC) 09/26/2012    Olegario Messier, MS, OTR/L 04/19/2017, 9:48 AM  [facility] MAIN [facility] SERVICES [address] [place], [place], [address] Phone: [phone]   Fax:  [phone]  Name: LEMOYNE NESTOR MRN: [id number] Date of Birth: [birth year]

## 2017-04-19 NOTE — Therapy (Signed)
[place] [facility] MAIN [facility] SERVICES [address] [place], [place], [address] Phone: [phone]   Fax:  [phone]  Physical Therapy Treatment  Patient Details  Name: Erik [name] MRN: [id number] Date of Birth: 19-Sep-1956 Referring Provider: Cruzita Lederer  Encounter Date: 04/19/2017      PT End of Session - 04/19/17 1250    Visit Number 11   Number of Visits 17   Date for PT Re-Evaluation 05/09/17   PT Start Time 1002   PT Stop Time 1045   PT Time Calculation (min) 43 min   Equipment Utilized During Treatment Other (comment);Gait belt  S-min A prn   Activity Tolerance Patient tolerated treatment well   Behavior During Therapy [facility] for tasks assessed/performed      Past Medical History:  Diagnosis Date  . Anxiety   . Arthritis    hands  . BPH (benign prostatic hyperplasia)   . Depression   . 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 ED (erectile dysfunction)   . GERD (gastroesophageal reflux disease)   . H/O calcium pyrophosphate deposition disease (CPPD)   . HLD (hyperlipidemia)   . Hypertension   . Hypogonadism in male   . Lower urinary tract symptoms (LUTS)   . Nocturia   . Stroke [facility])    Language Impairment.  Left Hemiparesis- improved  . Testosterone overdose   . Vitamin D deficiency     Past Surgical History:  Procedure Laterality Date  . BACK SURGERY    . COLON SURGERY     Colon Resection  . COLONOSCOPY    . IR ANGIO INTRA EXTRACRAN SEL COM CAROTID INNOMINATE UNI L MOD SED  03/07/2017  . IR ANGIO INTRA EXTRACRAN SEL COM CAROTID INNOMINATE UNI R MOD SED  02/16/2017  . IR ANGIO VERTEBRAL SEL SUBCLAVIAN INNOMINATE UNI L MOD SED  03/07/2017  . IR ANGIO VERTEBRAL SEL VERTEBRAL UNI R MOD SED  03/07/2017  . IR INFUSION THROMBOL ARTERIAL INITIAL (MS)  03/07/2017  . IR INTRAVSC STENT CERV CAROTID W/EMB-PROT MOD SED INCL ANGIO  03/07/2017  . IR PERCUTANEOUS ART THROMBECTOMY/INFUSION INTRACRANIAL INC DIAG  ANGIO  02/16/2017  . IR PTA NON CORO-LOWER EXTREM  02/16/2017  . IR RADIOLOGIST EVAL & MGMT  04/04/2017  . IR US GUIDE VASC ACCESS LEFT  02/16/2017  . IR US GUIDE VASC ACCESS RIGHT  02/16/2017  . KNEE ARTHROSCOPY Bilateral   . RADIOLOGY WITH ANESTHESIA N/A 02/16/2017   Procedure: RADIOLOGY WITH ANESTHESIA;  Surgeon: Julieanne Cotton, MD;  Location: [facility] OR;  Service: Radiology;  Laterality: N/A;  . RADIOLOGY WITH ANESTHESIA N/A 03/07/2017   Procedure: CAROTID STENT;  Surgeon: Julieanne Cotton, MD;  Location: [facility] OR;  Service: Radiology;  Laterality: N/A;  . ROTATOR CUFF REPAIR Right     There were no vitals filed for this visit.      Subjective Assessment - 04/19/17 1248    Subjective Pt. continues to walk a mile a day. He reports feeling stiffer today and having more difficulty lifting his foot   Pertinent History Pt. is a 61 y.o. male who had a CVA on the evening of April 12th, 2018. Pt had been to his grandson's ball game that day and watched a movie with his wife. He got in bed and his wife went to take a bath. When she came out pt was on the floor struggling to get back in bed. Pt had become weak while in bed, tried to  sit up and fell out of the bed. She could tell he was having a stroke, and called EMS. Pt was transported to [facility] and CTA head/neck revealed hyperdense L-MCA sign likely indicating acute thrombus. He received tPA and he underwent cerebral angiogram with L-MCA thrombectomy, dense calcified plaque L-ICA precluded stent placement and noted to have 73% stenosis R-ICA due to advanced atherosclerotic changes. Patient with resultant facial weakness with mild dysarthria, aphasia, mild left gaze preference and right sided weakness affecting mobility and ability to carry out ADL tasks. He was in CIR for approximately 8 days per patient and then discharged home. He has been evaluated by outpatient speech and occupational therapy and is now here with wife today for PT evaluation.    Patient  Stated Goals Improve his strength and balance in order to improve his function at home and be able to return to work.    Currently in Pain? No/denies     TherEx Treadmill: incline 5, 1.7 mph, 5 minutes, cues for lifting feet, not scuffing TRX squat 2x10  TRX lunges 10x each side Raised step (4 bricks under) 15 step ups with 10lb dumbbells on shoulder, 20 with no dumbbells, Raised step (4 bricks underneath) 12 side steps ups each side 25 Heel raises 30x bilateral Toe raises without df assist 10x, difficult to perform, require posterior lean Shoulder AAROM/PROM in supine flexion, abduction, hor add/abd, ER, IR with scapular overpressure in flexion and abduction. Rhythmic rotation of RUE due to UE influence in balance and mobility.     Neuro re-ed Agility ladder side stepping. Two feet in , two feet out, no LOB  airex pad marching with 5 second holds of single limb stance, occasional LOB, utilization of ankle strategy airex pad tandem stance with rotating twists with 5lb weighted bar 2x 60 seconds, increased LOB due to fatigue. Seated on blue swiss ball, arm raises x 10, heel raises x 10, marches x 10 with difficulty maintaining COM with abdominal control.      Pt. response to medical necessity:  Patient will continue to benefit from skilled physical therapy to improve strength, balance, and gait to improve safety during functional mobility         PT Long Term Goals - 03/14/17 1513      PT LONG TERM GOAL #1   Title Pt will be independent with HEP in order to improve strength and balance in order to decrease fall risk and improve function at home and work.    Time 8   Period Weeks   Status New     PT LONG TERM GOAL #2   Title Pt will improve BERG by at least 3 points in order to demonstrate clinically significant improvement in balance.   Baseline 03/14/17: 49/56   Time 8   Period Weeks   Status New     PT LONG TERM GOAL #3   Title Pt will improve DGI by at least 3 points in  order to demonstrate clinically significant improvement in balance and decreased risk for falls    Baseline 03/14/17: 17/24   Time 8   Period Weeks   Status New     PT LONG TERM GOAL #4   Title Pt will improve R hip abduction, adduction, and flexion strength by at least 1/2 MMT in order to improve gait    Baseline 03/14/17: see flowsheet   Time 8   Status New  Plan - 04/19/17 1252    Clinical Impression Statement Patient has more difficulty with active lifting of R foot due to stiffness that improved with cueing and repetition. Patient presents with increased strength and mobility of RLE with progression of step ups, TRX, and implementation of treadmill. RLE has tendency for eversion when ambulating and under-weight demands that is corrected with tactile and verbal cues. Combining UE and LE dynamic motions allows for functional challenges of balance and ambulation for return to previous level of function. Patient will continue to benefit from skilled physical therapy to improve strength, balance, and gait to improve safety during functional mobility.   Rehab Potential Good   Clinical Impairments Affecting Rehab Potential Positive: age, motivation, social support; Negative: significant ICA stenosis   PT Frequency 2x / week   PT Duration 8 weeks   PT Treatment/Interventions ADLs/Self Care Home Management;Aquatic Therapy;Biofeedback;Cryotherapy;Electrical Stimulation;Iontophoresis 4mg /ml Dexamethasone;Moist Heat;Ultrasound;DME Instruction;Gait training;Stair training;Functional mobility training;Therapeutic activities;Therapeutic exercise;Balance training;Neuromuscular re-education;Patient/family education;Manual techniques;Passive range of motion;Energy conservation   PT Next Visit Plan dynamic balance, trx, treadmill   PT Home Exercise Plan Hip 3 ways with RTB, mini squats, RTB resisted R ankle DF, toe taps, semitandem/tandem balance; Bil concentric and eccentric R calf raises; R  hamstring curls wiith GTB   Consulted and Agree with Plan of Care Patient      Patient will benefit from skilled therapeutic intervention in order to improve the following deficits and impairments:  Abnormal gait, Decreased balance, Decreased coordination, Decreased mobility, Decreased strength, Difficulty walking  Visit Diagnosis: Muscle weakness (generalized)  Other lack of coordination     Problem List Patient Active Problem List   Diagnosis Date Noted  . Hemiparesis affecting right side as late effect of cerebrovascular accident (CVA) (HCC) 03/16/2017  . Aphasia, late effect of cerebrovascular disease 03/16/2017  . Gait disturbance, post-stroke 03/16/2017  . Dysarthria as late effect of stroke 03/16/2017  . Carotid artery stenosis with cerebral infarction (HCC) 03/07/2017  . Language impairment   . ICAO (internal carotid artery occlusion), left 02/22/2017  . Acute respiratory failure (HCC) 02/22/2017  . Obesity 02/22/2017  . Expressive aphasia 02/22/2017  . Acute ischemic left MCA stroke (HCC) 02/22/2017  . Left hemiparesis (HCC)   . Status post total bilateral knee replacement   . Benign essential HTN   . Right-sided extracranial carotid artery stenosis   . Dysphagia, post-stroke   . Acute blood loss anemia   . Nocturia   . Newly diagnosed diabetes (HCC)   . CVA (cerebral vascular accident) (HCC) - L MCA s/p tPA and thrombectomy 02/16/2017  . Anxiety 04/30/2015  . Benign fibroma of prostate 04/30/2015  . Clinical depression 04/30/2015  . Acid reflux 04/30/2015  . HLD (hyperlipidemia) 04/30/2015  . BP (high blood pressure) 04/30/2015  . Arthritis, degenerative 04/30/2015  . 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 Arthritis, septic, knee (HCC) 09/26/2012   Precious BardMarina Sarahbeth Cashin, PT, DPT   04/19/2017, 12:53 PM  [place] [facility] [address] [place], [place], [address] Phone: [phone]   Fax:   [phone]  Name: Erik [name] MRN: [id number] Date of Birth: 01-14-1956

## 2017-04-19 NOTE — Therapy (Signed)
[facility] [facility] MAIN [facility] SERVICES [address] [place], [place], [address] Phone: [phone]   Fax:  [phone]  Speech Language Pathology Treatment  Patient Details  Name: Erik [name] [name]: [id number] Date of Birth: [birth year] Referring Provider: Charlton Amor   Encounter Date: 04/19/2017      End of Session - 04/19/17 1230    Visit Number 10   Number of Visits 20   Date for SLP Re-Evaluation 06/12/17   SLP Start Time 1045   SLP Stop Time  1145   SLP Time Calculation (min) 60 min   Activity Tolerance Patient tolerated treatment well      Past Medical History:  Diagnosis Date  . Anxiety   . Arthritis    hands  . BPH (benign prostatic hyperplasia)   . Depression   . 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 ED (erectile dysfunction)   . GERD (gastroesophageal reflux disease)   . H/O calcium pyrophosphate deposition disease (CPPD)   . HLD (hyperlipidemia)   . Hypertension   . Hypogonadism in male   . Lower urinary tract symptoms (LUTS)   . Nocturia   . Stroke [facility])    Language Impairment.  Left Hemiparesis- improved  . Testosterone overdose   . Vitamin D deficiency     Past Surgical History:  Procedure Laterality Date  . BACK SURGERY    . COLON SURGERY     Colon Resection  . COLONOSCOPY    . IR ANGIO INTRA EXTRACRAN SEL COM CAROTID INNOMINATE UNI L MOD SED  03/07/2017  . IR ANGIO INTRA EXTRACRAN SEL COM CAROTID INNOMINATE UNI R MOD SED  02/16/2017  . IR ANGIO VERTEBRAL SEL SUBCLAVIAN INNOMINATE UNI L MOD SED  03/07/2017  . IR ANGIO VERTEBRAL SEL VERTEBRAL UNI R MOD SED  03/07/2017  . IR INFUSION THROMBOL ARTERIAL INITIAL (MS)  03/07/2017  . IR INTRAVSC STENT CERV CAROTID W/EMB-PROT MOD SED INCL ANGIO  03/07/2017  . IR PERCUTANEOUS ART THROMBECTOMY/INFUSION INTRACRANIAL INC DIAG ANGIO  02/16/2017  . IR PTA NON CORO-LOWER EXTREM  02/16/2017  . IR RADIOLOGIST EVAL & MGMT  04/04/2017  . IR US GUIDE VASC  ACCESS LEFT  02/16/2017  . IR US GUIDE VASC ACCESS RIGHT  02/16/2017  . KNEE ARTHROSCOPY Bilateral   . RADIOLOGY WITH ANESTHESIA N/A 02/16/2017   Procedure: RADIOLOGY WITH ANESTHESIA;  Surgeon: Julieanne Cotton, MD;  Location: [facility] OR;  Service: Radiology;  Laterality: N/A;  . RADIOLOGY WITH ANESTHESIA N/A 03/07/2017   Procedure: CAROTID STENT;  Surgeon: Julieanne Cotton, MD;  Location: [facility] OR;  Service: Radiology;  Laterality: N/A;  . ROTATOR CUFF REPAIR Right     There were no vitals filed for this visit.      Subjective Assessment - 04/19/17 1229    Subjective  Patient was pleasant and worked hard during the session, showing minimal signs of frustration.   Currently in Pain? No/denies               ADULT SLP TREATMENT - 04/19/17 0001      General Information   Behavior/Cognition Alert;Cooperative;Pleasant mood   HPI 61 year old man with left MCA CVA 02/15/2017.  Patient received inpatient rehab services, including SLP, 02/22/2017-03/02/2017.     Treatment Provided   Treatment provided Cognitive-Linquistic     Cognitive-Linquistic Treatment   Treatment focused on Aphasia;Dysarthria   Skilled Treatment Patient completed a task involving distinguishing between similar word pairs with 85%  phonemic accuracy. Patient was able to come up with multiple meanings for words with 70% semantic accuracy and 80% intelligibility. Patient completed daily skills explanation tasks with 75% intelligibility. He required attention redirection, as well as skilled cueing and scaffolding techniques from the clinician to aid him in word finding and in providing adequate content words. The patient required minimal cueing to slow down his rate of speech and enunciate his words. Patient demonstrated the ability to self-monitor and self-cue.     Assessment / Recommendations / Plan   Plan Continue with current plan of care     Progression Toward Goals   Progression toward goals Progressing toward goals           SLP Education - 04/19/17 1229    Education provided Yes   Education Details Conversation strategies   Person(s) Educated Patient   Methods Explanation   Comprehension Verbalized understanding            SLP Long Term Goals - 04/19/17 1231      SLP LONG TERM GOAL #1   Title Pt will improve speech intelligibility in response to moderately complex cognitive linguistic tasks by controlling rate of speech, over-articulation, and increased loudness to achieve 80% intelligibility.   Status On-going     SLP LONG TERM GOAL #2   Title Patient will read aloud sentences and multi-syllabic words, maintaining phonemic accuracy, with 80% accuracy.     Time 12   Period Weeks   Status On-going     SLP LONG TERM GOAL #3   Title Patient will demonstrate reading comprehension for paragraphs with 80% accuracy.    Time 12   Period Weeks   Status On-going     SLP LONG TERM GOAL #4   Title Patient will complete high level word finding tasks with 80% accuracy.   Time 12   Period Weeks   Status On-going          Plan - 04/19/17 1230    Clinical Impression Statement The patient is requiring fewer cues from the clinician to slow down his rate of speech. The patient's intelligibility at the word and phrase level is improving, and he is able to correct himself when he makes a phonemic error. The patient is progressing in his ability to phonemically differentiate similar word pairs. He is also able to describe functional tasks with better intelligibility and requires less cueing to provide adequate content words at the phrase level. The patient still requires moderate cueing from the clinician for his intelligibility at the conversation level. The patient has demonstrated increased awareness of phonemic errors and the ability to monitor, cue, and correct himself.    Speech Therapy Frequency 2x / week   Duration Other (comment)   Treatment/Interventions Patient/family education;Compensatory  strategies;SLP instruction and feedback;Functional tasks;Cueing hierarchy   Potential to Achieve Goals Good   Potential Considerations Ability to learn/carryover information;Co-morbidities;Cooperation/participation level;Medical prognosis;Previous level of function;Severity of impairments;Family/community support   SLP Home Exercise Plan Daily conversation with family members   Consulted and Agree with Plan of Care Patient      Patient will benefit from skilled therapeutic intervention in order to improve the following deficits and impairments:   Dysarthria  Aphasia    Problem List Patient Active Problem List   Diagnosis Date Noted  . Hemiparesis affecting right side as late effect of cerebrovascular accident (CVA) (HCC) 03/16/2017  . Aphasia, late effect of cerebrovascular disease 03/16/2017  . Gait disturbance, post-stroke 03/16/2017  . Dysarthria as late  effect of stroke 03/16/2017  . Carotid artery stenosis with cerebral infarction (HCC) 03/07/2017  . Language impairment   . ICAO (internal carotid artery occlusion), left 02/22/2017  . Acute respiratory failure (HCC) 02/22/2017  . Obesity 02/22/2017  . Expressive aphasia 02/22/2017  . Acute ischemic left MCA stroke (HCC) 02/22/2017  . Left hemiparesis (HCC)   . Status post total bilateral knee replacement   . Benign essential HTN   . Right-sided extracranial carotid artery stenosis   . Dysphagia, post-stroke   . Acute blood loss anemia   . Nocturia   . Newly diagnosed diabetes (HCC)   . CVA (cerebral vascular accident) (HCC) - L MCA s/p tPA and thrombectomy 02/16/2017  . Anxiety 04/30/2015  . Benign fibroma of prostate 04/30/2015  . Clinical depression 04/30/2015  . Acid reflux 04/30/2015  . HLD (hyperlipidemia) 04/30/2015  . BP (high blood pressure) 04/30/2015  . Arthritis, degenerative 04/30/2015  . 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 Arthritis, septic, knee (HCC) 09/26/2012    Clydene Pugh 04/19/2017, 12:33 PM  [place] [facility] MAIN [facility] SERVICES [address] [place], [place], [address] Phone: [phone]   Fax:  [phone]   Name: Erik [name]: [id number] Date of Birth: 1956-10-17

## 2017-04-24 ENCOUNTER — Ambulatory Visit: Payer: Managed Care, Other (non HMO)

## 2017-04-24 ENCOUNTER — Encounter: Payer: Self-pay | Admitting: Speech Pathology

## 2017-04-24 ENCOUNTER — Ambulatory Visit: Payer: Managed Care, Other (non HMO) | Admitting: Speech Pathology

## 2017-04-24 ENCOUNTER — Encounter: Payer: Self-pay | Admitting: Occupational Therapy

## 2017-04-24 ENCOUNTER — Ambulatory Visit: Payer: Managed Care, Other (non HMO) | Admitting: Occupational Therapy

## 2017-04-24 DIAGNOSIS — R278 Other lack of coordination: Secondary | ICD-10-CM

## 2017-04-24 DIAGNOSIS — M6281 Muscle weakness (generalized): Secondary | ICD-10-CM

## 2017-04-24 DIAGNOSIS — R4701 Aphasia: Secondary | ICD-10-CM

## 2017-04-24 DIAGNOSIS — R2681 Unsteadiness on feet: Secondary | ICD-10-CM

## 2017-04-24 DIAGNOSIS — R471 Dysarthria and anarthria: Secondary | ICD-10-CM

## 2017-04-24 NOTE — Therapy (Signed)
[facility] MAIN [facility] SERVICES [address] [place], [place], [address] Phone: [phone]   Fax:  [phone]  Speech Language Pathology Treatment  Patient Details  Name: Erik [name] MRN: [id number] Date of Birth: Dec 13, 1955 Referring Provider: Charlton Amor   Encounter Date: 04/24/2017      End of Session - 04/24/17 1349    Visit Number 11   Number of Visits 20   Date for SLP Re-Evaluation 06/12/17   SLP Start Time 1100   SLP Stop Time  1200   SLP Time Calculation (min) 60 min   Activity Tolerance Patient tolerated treatment well      Past Medical History:  Diagnosis Date  . Anxiety   . Arthritis    hands  . BPH (benign prostatic hyperplasia)   . Depression   . 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 ED (erectile dysfunction)   . GERD (gastroesophageal reflux disease)   . H/O calcium pyrophosphate deposition disease (CPPD)   . HLD (hyperlipidemia)   . Hypertension   . Hypogonadism in male   . Lower urinary tract symptoms (LUTS)   . Nocturia   . Stroke [facility])    Language Impairment.  Left Hemiparesis- improved  . Testosterone overdose   . Vitamin D deficiency     Past Surgical History:  Procedure Laterality Date  . BACK SURGERY    . COLON SURGERY     Colon Resection  . COLONOSCOPY    . IR ANGIO INTRA EXTRACRAN SEL COM CAROTID INNOMINATE UNI L MOD SED  03/07/2017  . IR ANGIO INTRA EXTRACRAN SEL COM CAROTID INNOMINATE UNI R MOD SED  02/16/2017  . IR ANGIO VERTEBRAL SEL SUBCLAVIAN INNOMINATE UNI L MOD SED  03/07/2017  . IR ANGIO VERTEBRAL SEL VERTEBRAL UNI R MOD SED  03/07/2017  . IR INFUSION THROMBOL ARTERIAL INITIAL (MS)  03/07/2017  . IR INTRAVSC STENT CERV CAROTID W/EMB-PROT MOD SED INCL ANGIO  03/07/2017  . IR PERCUTANEOUS ART THROMBECTOMY/INFUSION INTRACRANIAL INC DIAG ANGIO  02/16/2017  . IR PTA NON CORO-LOWER EXTREM  02/16/2017  . IR RADIOLOGIST EVAL & MGMT  04/04/2017  . IR US GUIDE VASC  ACCESS LEFT  02/16/2017  . IR US GUIDE VASC ACCESS RIGHT  02/16/2017  . KNEE ARTHROSCOPY Bilateral   . RADIOLOGY WITH ANESTHESIA N/A 02/16/2017   Procedure: RADIOLOGY WITH ANESTHESIA;  Surgeon: Julieanne Cotton, MD;  Location: [facility] OR;  Service: Radiology;  Laterality: N/A;  . RADIOLOGY WITH ANESTHESIA N/A 03/07/2017   Procedure: CAROTID STENT;  Surgeon: Julieanne Cotton, MD;  Location: [facility] OR;  Service: Radiology;  Laterality: N/A;  . ROTATOR CUFF REPAIR Right     There were no vitals filed for this visit.      Subjective Assessment - 04/24/17 1347    Subjective  Patient was pleasant and worked hard during the session, showing minimal signs of frustration.   Currently in Pain? No/denies               ADULT SLP TREATMENT - 04/24/17 0001      General Information   Behavior/Cognition Alert;Cooperative;Pleasant mood   HPI 61 year old man with left MCA CVA 02/15/2017.  Patient received inpatient rehab services, including SLP, 02/22/2017-03/02/2017.     Treatment Provided   Treatment provided Cognitive-Linquistic     Pain Assessment   Pain Assessment No/denies pain     Cognitive-Linquistic Treatment   Treatment focused on Aphasia;Dysarthria   Skilled Treatment  Patient completed a task that involved explaining functional skills with 70% intelligibility before phonemic cues and 80% after cues. Patient came up with synonyms for a list of words and produced sentences with the synonyms with 85% intelligibility. Clinician provided cues throughout for clear, slow rate of speech. Patient demonstrated 80% intelligibility in conversation and the ability to self-monitor and self-correct, though he still requires cueing.     Assessment / Recommendations / Plan   Plan Continue with current plan of care     Progression Toward Goals   Progression toward goals Progressing toward goals          SLP Education - 04/24/17 1348    Education provided Yes   Education Details conversation strategies    Person(s) Educated Patient   Methods Explanation   Comprehension Verbalized understanding            SLP Long Term Goals - 04/19/17 1231      SLP LONG TERM GOAL #1   Title Pt will improve speech intelligibility in response to moderately complex cognitive linguistic tasks by controlling rate of speech, over-articulation, and increased loudness to achieve 80% intelligibility.   Status On-going     SLP LONG TERM GOAL #2   Title Patient will read aloud sentences and multi-syllabic words, maintaining phonemic accuracy, with 80% accuracy.     Time 12   Period Weeks   Status On-going     SLP LONG TERM GOAL #3   Title Patient will demonstrate reading comprehension for paragraphs with 80% accuracy.    Time 12   Period Weeks   Status On-going     SLP LONG TERM GOAL #4   Title Patient will complete high level word finding tasks with 80% accuracy.   Time 12   Period Weeks   Status On-going          Plan - 04/24/17 1350    Clinical Impression Statement Patient is completing functional tasks with adequate intelligibility. Patient needs to attend to his speech with more focus in order to self-monitor more successfully. Patient requires fewer cues from the clinician to slow down his rate of speech and to use clear speech.   Speech Therapy Frequency 2x / week   Duration Other (comment)   Treatment/Interventions Patient/family education;Compensatory strategies;SLP instruction and feedback;Functional tasks;Cueing hierarchy   Potential to Achieve Goals Good   Potential Considerations Ability to learn/carryover information;Co-morbidities;Cooperation/participation level;Medical prognosis;Previous level of function;Severity of impairments;Family/community support   SLP Home Exercise Plan Daily conversation with family members   Consulted and Agree with Plan of Care Patient      Patient will benefit from skilled therapeutic intervention in order to improve the following deficits and  impairments:   Dysarthria  Aphasia    Problem List Patient Active Problem List   Diagnosis Date Noted  . Hemiparesis affecting right side as late effect of cerebrovascular accident (CVA) (HCC) 03/16/2017  . Aphasia, late effect of cerebrovascular disease 03/16/2017  . Gait disturbance, post-stroke 03/16/2017  . Dysarthria as late effect of stroke 03/16/2017  . Carotid artery stenosis with cerebral infarction (HCC) 03/07/2017  . Language impairment   . ICAO (internal carotid artery occlusion), left 02/22/2017  . Acute respiratory failure (HCC) 02/22/2017  . Obesity 02/22/2017  . Expressive aphasia 02/22/2017  . Acute ischemic left MCA stroke (HCC) 02/22/2017  . Left hemiparesis (HCC)   . Status post total bilateral knee replacement   . Benign essential HTN   . Right-sided extracranial carotid artery stenosis   .  Dysphagia, post-stroke   . Acute blood loss anemia   . Nocturia   . Newly diagnosed diabetes (HCC)   . CVA (cerebral vascular accident) (HCC) - L MCA s/p tPA and thrombectomy 02/16/2017  . Anxiety 04/30/2015  . Benign fibroma of prostate 04/30/2015  . Clinical depression 04/30/2015  . Acid reflux 04/30/2015  . HLD (hyperlipidemia) 04/30/2015  . BP (high blood pressure) 04/30/2015  . Arthritis, degenerative 04/30/2015  . 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 Arthritis, septic, knee (HCC) 09/26/2012    Clydene Pugh 04/24/2017, 1:50 PM  [facility] MAIN [facility] SERVICES [address] [place], [place], [address] Phone: [phone]   Fax:  [phone]   Name: FINDLEY VI MRN: [id number] Date of Birth: Feb 17, 1956

## 2017-04-24 NOTE — Therapy (Signed)
[facility] MAIN [facility] SERVICES [address] [place], [place], [address] Phone: [phone]   Fax:  [phone]  Occupational Therapy Treatment  Patient Details  Name: LAJARVIS ITALIANO MRN: [id number] Date of Birth: 1956/01/08 Referring Provider: Dr. Hildred Alamin  Encounter Date: 04/24/2017      OT End of Session - 04/24/17 0933    Visit Number 10   Number of Visits 24   Date for OT Re-Evaluation 06/04/17   OT Start Time 0915   OT Stop Time 1000   OT Time Calculation (min) 45 min   Activity Tolerance Patient tolerated treatment well   Behavior During Therapy [facility] for tasks assessed/performed      Past Medical History:  Diagnosis Date  . Anxiety   . Arthritis    hands  . BPH (benign prostatic hyperplasia)   . Depression   . 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 ED (erectile dysfunction)   . GERD (gastroesophageal reflux disease)   . H/O calcium pyrophosphate deposition disease (CPPD)   . HLD (hyperlipidemia)   . Hypertension   . Hypogonadism in male   . Lower urinary tract symptoms (LUTS)   . Nocturia   . Stroke [facility])    Language Impairment.  Left Hemiparesis- improved  . Testosterone overdose   . Vitamin D deficiency     Past Surgical History:  Procedure Laterality Date  . BACK SURGERY    . COLON SURGERY     Colon Resection  . COLONOSCOPY    . IR ANGIO INTRA EXTRACRAN SEL COM CAROTID INNOMINATE UNI L MOD SED  03/07/2017  . IR ANGIO INTRA EXTRACRAN SEL COM CAROTID INNOMINATE UNI R MOD SED  02/16/2017  . IR ANGIO VERTEBRAL SEL SUBCLAVIAN INNOMINATE UNI L MOD SED  03/07/2017  . IR ANGIO VERTEBRAL SEL VERTEBRAL UNI R MOD SED  03/07/2017  . IR INFUSION THROMBOL ARTERIAL INITIAL (MS)  03/07/2017  . IR INTRAVSC STENT CERV CAROTID W/EMB-PROT MOD SED INCL ANGIO  03/07/2017  . IR PERCUTANEOUS ART THROMBECTOMY/INFUSION INTRACRANIAL INC DIAG ANGIO  02/16/2017  . IR PTA NON CORO-LOWER EXTREM  02/16/2017  . IR RADIOLOGIST  EVAL & MGMT  04/04/2017  . IR US GUIDE VASC ACCESS LEFT  02/16/2017  . IR US GUIDE VASC ACCESS RIGHT  02/16/2017  . KNEE ARTHROSCOPY Bilateral   . RADIOLOGY WITH ANESTHESIA N/A 02/16/2017   Procedure: RADIOLOGY WITH ANESTHESIA;  Surgeon: Julieanne Cotton, MD;  Location: [facility] OR;  Service: Radiology;  Laterality: N/A;  . RADIOLOGY WITH ANESTHESIA N/A 03/07/2017   Procedure: CAROTID STENT;  Surgeon: Julieanne Cotton, MD;  Location: [facility] OR;  Service: Radiology;  Laterality: N/A;  . ROTATOR CUFF REPAIR Right     There were no vitals filed for this visit.      Subjective Assessment - 04/24/17 0930    Subjective  Pt. reports he had a nice Father's Day.   Patient is accompained by: Family member   Pertinent History Pt. is a 61 y.o. male who had a CVA on the evening of April 12th, 2018. Pt. had been to his grandson's ball game, and was preparing for bed when he felling getting into bed. According to his wife, he struggled to get up. She could tell he was having a stroke, and called EMS. Pt. was transported to [facility] was administered TPA,. Pt. underwent a cerebral angiogram with Rt ICA stent assisted angioplasty. Pt. received Therapy services in CIR. at [facility].  Patient Stated Goals To regain the use of his right hand   Currently in Pain? No/denies            [facility] OT Assessment - 04/24/17 1022      Coordination   Right 9 Hole Peg Test 40 sec.     Strength   Overall Strength Comments RUE strenth shoulder flexion 4+/abduction 4-/5, elbow flexion, extension 5/5, wrist extension 4+/5     Hand Function   Right Hand Grip (lbs) 62   Right Hand Lateral Pinch 14 lbs   Right Hand 3 Point Pinch 14 lbs      OT TREATMENT     Neuro muscular re-education:  Pt. performed right hand [facility] skills training to improve speed and dexterity needed for ADL tasks and writing. Pt. demonstrated grasping 1 inch sticks,  inch cylindrical collars, and  inch flat washers on the Purdue pegboard. Pt.  performed grasping each item with her 2nd digit and thumb, and storing them in the palm. Pt. presented with difficulty storing  inch objects at a time in the palmar aspect of the hand.  Therapeutic Exercise:  Pt. performed 2# dowel ex. For UE strengthening secondary to weakness. Bilateral shoulder flexion, chest press, circular patterns, and elbow flexion/extension were performed. 3# dumbbell ex. for elbow flexion and extension,  2# for forearm supination/pronation, wrist flexion/extension, and radial deviation. Pt. requires rest breaks and verbal cues for proper technique.                             OT Education - 04/24/17 0932    Education provided Yes   Education Details UE ther. ex   Person(s) Educated Patient   Methods Explanation;Demonstration   Comprehension Verbalized understanding;Returned demonstration             OT Long Term Goals - 04/24/17 1011      OT LONG TERM GOAL #1   Title Pt. will independently, and efficiently write 3 sentences with 100% legibility.   Baseline Increased time required. 90% legibility for only 2 words in large print.   Time 12   Period Weeks   Status New     OT LONG TERM GOAL #2   Title Pt. will independently type a paragraph efficiently.   Baseline Pt. is unable   Time 12   Period Weeks   Status On-going     OT LONG TERM GOAL #3   Title Pt. improve right grip strength by 5 pounds to be able to hold and use a wrench.   Baseline Pt. is unable   Time 12   Period Weeks   Status On-going     OT LONG TERM GOAL #4   Title Pt. will improve RUE strength, and hand function skills for independence with work related tasks.   Baseline Pt. is unable to apply toothpaste onto a toothbrush.   Time 12   Period Weeks   Status Revised     OT LONG TERM GOAL #5   Title Pt. will improve FMC by 10 sec. to be able to independently handle, and count change.   Baseline Pt. has difficulty   Time 12   Period Weeks   Status  On-going     OT LONG TERM GOAL #6   Title Pt. will increase RUE strength by 2mm grades to be able to complete ADLs/IADLs.   Baseline Pt. has difficulty   Time 12   Period Weeks  Status On-going     OT LONG TERM GOAL #7   Title Pt. will demonstrate independence with visual compensatory strategies for the right 100% of the time during ADL, and IADL tasks.   Baseline Limited on the right. Pt. occ. misses obstacle on the right.   Time 12   Period Weeks   Status On-going               Plan - 04/24/17 0934    Clinical Impression Statement Pt. reports his hand does not feel as tight as it was last week. Pt. reports he is picking up small objects easier. Pt. continues to present wiith RUE and hand weakness, and incoordination. Pt. reports he would like to return to work in July. Pt. goals were reviewed, and modified with pt.   Occupational performance deficits (Please refer to evaluation for details): ADL's   Rehab Potential Good   OT Frequency 2x / week   OT Duration 12 weeks   OT Treatment/Interventions Self-care/ADL training;Therapeutic exercise;Therapeutic exercises;Therapeutic activities;DME and/or AE instruction;Neuromuscular education;Patient/family education;Functional Mobility Training;Cognitive remediation/compensation;Visual/perceptual remediation/compensation;Energy conservation   Consulted and Agree with Plan of Care Patient;Family member/caregiver      Patient will benefit from skilled therapeutic intervention in order to improve the following deficits and impairments:  Decreased coordination, Decreased balance, Impaired UE functional use, Decreased cognition, Impaired vision/preception, Decreased strength, Decreased knowledge of use of DME, Decreased activity tolerance, Impaired tone, Impaired flexibility, Decreased range of motion, Decreased endurance  Visit Diagnosis: Muscle weakness (generalized)  Other lack of coordination    Problem List Patient Active  Problem List   Diagnosis Date Noted  . Hemiparesis affecting right side as late effect of cerebrovascular accident (CVA) (HCC) 03/16/2017  . Aphasia, late effect of cerebrovascular disease 03/16/2017  . Gait disturbance, post-stroke 03/16/2017  . Dysarthria as late effect of stroke 03/16/2017  . Carotid artery stenosis with cerebral infarction (HCC) 03/07/2017  . Language impairment   . ICAO (internal carotid artery occlusion), left 02/22/2017  . Acute respiratory failure (HCC) 02/22/2017  . Obesity 02/22/2017  . Expressive aphasia 02/22/2017  . Acute ischemic left MCA stroke (HCC) 02/22/2017  . Left hemiparesis (HCC)   . Status post total bilateral knee replacement   . Benign essential HTN   . Right-sided extracranial carotid artery stenosis   . Dysphagia, post-stroke   . Acute blood loss anemia   . Nocturia   . Newly diagnosed diabetes (HCC)   . CVA (cerebral vascular accident) (HCC) - L MCA s/p tPA and thrombectomy 02/16/2017  . Anxiety 04/30/2015  . Benign fibroma of prostate 04/30/2015  . Clinical depression 04/30/2015  . Acid reflux 04/30/2015  . HLD (hyperlipidemia) 04/30/2015  . BP (high blood pressure) 04/30/2015  . Arthritis, degenerative 04/30/2015  . 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 Arthritis, septic, knee (HCC) 09/26/2012    Olegario MessierElaine Manvir Prabhu 04/24/2017, 10:23 AM  [place] MAIN [facility] [address] [place], [place], [address] Phone: [phone]   Fax:  [phone]  Name: Keturah BarreJames W Birky MRN: [id number] Date of Birth: Oct 17, 1956

## 2017-04-24 NOTE — Therapy (Signed)
[place] MAIN [facility] SERVICES [address] [place], [place], [address] Phone: [phone]   Fax:  [phone]  Physical Therapy Treatment  Patient Details  Name: Erik [name] MRN: [id number] Date of Birth: [birth year] Referring Provider: Cruzita Lederer  Encounter Date: 04/24/2017      PT End of Session - 04/24/17 1053    Visit Number 12   Number of Visits 17   Date for PT Re-Evaluation 05/09/17   PT Start Time 1010   PT Stop Time 1051   PT Time Calculation (min) 41 min   Equipment Utilized During Treatment Other (comment);Gait belt  S-min A prn   Activity Tolerance Patient tolerated treatment well   Behavior During Therapy [facility] for tasks assessed/performed      Past Medical History:  Diagnosis Date  . Anxiety   . Arthritis    hands  . BPH (benign prostatic hyperplasia)   . Depression   . 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 ED (erectile dysfunction)   . GERD (gastroesophageal reflux disease)   . H/O calcium pyrophosphate deposition disease (CPPD)   . HLD (hyperlipidemia)   . Hypertension   . Hypogonadism in male   . Lower urinary tract symptoms (LUTS)   . Nocturia   . Stroke [facility])    Language Impairment.  Left Hemiparesis- improved  . Testosterone overdose   . Vitamin D deficiency     Past Surgical History:  Procedure Laterality Date  . BACK SURGERY    . COLON SURGERY     Colon Resection  . COLONOSCOPY    . IR ANGIO INTRA EXTRACRAN SEL COM CAROTID INNOMINATE UNI L MOD SED  03/07/2017  . IR ANGIO INTRA EXTRACRAN SEL COM CAROTID INNOMINATE UNI R MOD SED  02/16/2017  . IR ANGIO VERTEBRAL SEL SUBCLAVIAN INNOMINATE UNI L MOD SED  03/07/2017  . IR ANGIO VERTEBRAL SEL VERTEBRAL UNI R MOD SED  03/07/2017  . IR INFUSION THROMBOL ARTERIAL INITIAL (MS)  03/07/2017  . IR INTRAVSC STENT CERV CAROTID W/EMB-PROT MOD SED INCL ANGIO  03/07/2017  . IR PERCUTANEOUS ART THROMBECTOMY/INFUSION INTRACRANIAL INC DIAG  ANGIO  02/16/2017  . IR PTA NON CORO-LOWER EXTREM  02/16/2017  . IR RADIOLOGIST EVAL & MGMT  04/04/2017  . IR US GUIDE VASC ACCESS LEFT  02/16/2017  . IR US GUIDE VASC ACCESS RIGHT  02/16/2017  . KNEE ARTHROSCOPY Bilateral   . RADIOLOGY WITH ANESTHESIA N/A 02/16/2017   Procedure: RADIOLOGY WITH ANESTHESIA;  Surgeon: Julieanne Cotton, MD;  Location: [facility] OR;  Service: Radiology;  Laterality: N/A;  . RADIOLOGY WITH ANESTHESIA N/A 03/07/2017   Procedure: CAROTID STENT;  Surgeon: Julieanne Cotton, MD;  Location: [facility] OR;  Service: Radiology;  Laterality: N/A;  . ROTATOR CUFF REPAIR Right     There were no vitals filed for this visit.      Subjective Assessment - 04/24/17 1052    Subjective Pt. cleaned off his treadmill and started walking yesterday for 30 minutes and today for 20 minutes with minimal-no use of handrails and only a few scuffs. He is preparing to leave for a beach vacation in [place].    Pertinent History Pt. is a 61 y.o. male who had a CVA on the evening of April 12th, 2018. Pt had been to his grandson's ball game that day and watched a movie with his wife. He got in bed and his wife went to take a bath. When she came out pt  was on the floor struggling to get back in bed. Pt had become weak while in bed, tried to sit up and fell out of the bed. She could tell he was having a stroke, and called EMS. Pt was transported to [facility] and CTA head/neck revealed hyperdense L-MCA sign likely indicating acute thrombus. He received tPA and he underwent cerebral angiogram with L-MCA thrombectomy, dense calcified plaque L-ICA precluded stent placement and noted to have 73% stenosis R-ICA due to advanced atherosclerotic changes. Patient with resultant facial weakness with mild dysarthria, aphasia, mild left gaze preference and right sided weakness affecting mobility and ability to carry out ADL tasks. He was in CIR for approximately 8 days per patient and then discharged home. He has been evaluated by  outpatient speech and occupational therapy and is now here with wife today for PT evaluation.    Patient Stated Goals Improve his strength and balance in order to improve his function at home and be able to return to work.    Currently in Pain? No/denies     TherEx Treadmill: incline 5, 1.7 mph, 5 minutes, cues for lifting feet, not scuffing TRX squat 1x18 with arm abduction flys at top  TRX lunges 15x each side, cues for increased extension of hip Marble transfer with foot intrinsics 2x10 Heel raises 30x bilateral Toe raises without df assist 10x, difficult to perform, require posterior lean Shoulder AAROM/PROM in supine flexion, abduction, hor add/abd, ER, IR with scapular overpressure in flexion and abduction. Rhythmic rotation of RUE due to UE influence in balance and mobility.    Neuro re-ed Obstacle course: step up and over two consecutive raised steps (4 bricks high) then perform Agility ladder side stepping. Two feet in , two feet out, no LOB, side stepping, forward stepping no scuff. 6x. airex pad tandem stance with rotating twists with 5lb weighted bar 2x 60 seconds, increased LOB due to fatigue. Airex pad with arm flexion with 5lb weighted bar    Pt. Performed majority of session without df assist brace.    Pt. response to medical necessity:  Patient will continue to benefit from skilled physical therapy to improve strength, balance, and gait to improve safety during functional mobility          PT Education - 04/24/17 1053    Education provided Yes   Education Details body mechanics for exercises, lifting foot avoiding scuffing and foot drop   Person(s) Educated Patient   Methods Explanation;Demonstration;Verbal cues   Comprehension Verbalized understanding;Returned demonstration;Verbal cues required             PT Long Term Goals - 03/14/17 1513      PT LONG TERM GOAL #1   Title Pt will be independent with HEP in order to improve strength and balance in  order to decrease fall risk and improve function at home and work.    Time 8   Period Weeks   Status New     PT LONG TERM GOAL #2   Title Pt will improve BERG by at least 3 points in order to demonstrate clinically significant improvement in balance.   Baseline 03/14/17: 49/56   Time 8   Period Weeks   Status New     PT LONG TERM GOAL #3   Title Pt will improve DGI by at least 3 points in order to demonstrate clinically significant improvement in balance and decreased risk for falls    Baseline 03/14/17: 17/24   Time 8   Period Weeks  Status New     PT LONG TERM GOAL #4   Title Pt will improve R hip abduction, adduction, and flexion strength by at least 1/2 MMT in order to improve gait    Baseline 03/14/17: see flowsheet   Time 8   Status New               Plan - 04/24/17 1056    Clinical Impression Statement Patient performed the majority of session today without df assist brace with few instances of scuffing.  Scuffing of foot while ambulating on incline treadmill occurred occasionally when pt. was distracted from task. Stepping up tasks were challenging and improved with repetition. Combining UE and LE dynamic motions allows for functional challenges of balance and ambulation for return to previous level of function. Patient will continue to benefit from skilled physical therapy to improve strength, balance, and gait to improve safety during functional mobility   Rehab Potential Good   Clinical Impairments Affecting Rehab Potential Positive: age, motivation, social support; Negative: significant ICA stenosis   PT Frequency 2x / week   PT Duration 8 weeks   PT Treatment/Interventions ADLs/Self Care Home Management;Aquatic Therapy;Biofeedback;Cryotherapy;Electrical Stimulation;Iontophoresis 4mg /ml Dexamethasone;Moist Heat;Ultrasound;DME Instruction;Gait training;Stair training;Functional mobility training;Therapeutic activities;Therapeutic exercise;Balance training;Neuromuscular  re-education;Patient/family education;Manual techniques;Passive range of motion;Energy conservation   PT Next Visit Plan walking across streets practice, practice walking in sand with and w/o shoes   PT Home Exercise Plan Hip 3 ways with RTB, mini squats, RTB resisted R ankle DF, toe taps, semitandem/tandem balance; Bil concentric and eccentric R calf raises; R hamstring curls wiith GTB   Consulted and Agree with Plan of Care Patient      Patient will benefit from skilled therapeutic intervention in order to improve the following deficits and impairments:  Abnormal gait, Decreased balance, Decreased coordination, Decreased mobility, Decreased strength, Difficulty walking  Visit Diagnosis: Muscle weakness (generalized)  Other lack of coordination  Unsteadiness on feet     Problem List Patient Active Problem List   Diagnosis Date Noted  . Hemiparesis affecting right side as late effect of cerebrovascular accident (CVA) (HCC) 03/16/2017  . Aphasia, late effect of cerebrovascular disease 03/16/2017  . Gait disturbance, post-stroke 03/16/2017  . Dysarthria as late effect of stroke 03/16/2017  . Carotid artery stenosis with cerebral infarction (HCC) 03/07/2017  . Language impairment   . ICAO (internal carotid artery occlusion), left 02/22/2017  . Acute respiratory failure (HCC) 02/22/2017  . Obesity 02/22/2017  . Expressive aphasia 02/22/2017  . Acute ischemic left MCA stroke (HCC) 02/22/2017  . Left hemiparesis (HCC)   . Status post total bilateral knee replacement   . Benign essential HTN   . Right-sided extracranial carotid artery stenosis   . Dysphagia, post-stroke   . Acute blood loss anemia   . Nocturia   . Newly diagnosed diabetes (HCC)   . CVA (cerebral vascular accident) (HCC) - L MCA s/p tPA and thrombectomy 02/16/2017  . Anxiety 04/30/2015  . Benign fibroma of prostate 04/30/2015  . Clinical depression 04/30/2015  . Acid reflux 04/30/2015  . HLD (hyperlipidemia)  04/30/2015  . BP (high blood pressure) 04/30/2015  . Arthritis, degenerative 04/30/2015  . 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 Arthritis, septic, knee (HCC) 09/26/2012   Precious BardMarina Khoi Hamberger, PT, DPT   04/24/2017, 10:57 AM  [facility] [address] [place], [place], [address] Phone: [phone]   Fax:  [phone]  Name: Keturah BarreJames W Kurt MRN: [id number] Date of  Birth: Sep 17, 1956

## 2017-04-26 ENCOUNTER — Ambulatory Visit: Payer: Managed Care, Other (non HMO) | Admitting: Occupational Therapy

## 2017-04-26 ENCOUNTER — Encounter: Payer: Self-pay | Admitting: Speech Pathology

## 2017-04-26 ENCOUNTER — Ambulatory Visit: Payer: Managed Care, Other (non HMO) | Admitting: Speech Pathology

## 2017-04-26 ENCOUNTER — Ambulatory Visit: Payer: Managed Care, Other (non HMO)

## 2017-04-26 ENCOUNTER — Encounter: Payer: Self-pay | Admitting: Occupational Therapy

## 2017-04-26 DIAGNOSIS — R278 Other lack of coordination: Secondary | ICD-10-CM

## 2017-04-26 DIAGNOSIS — M6281 Muscle weakness (generalized): Secondary | ICD-10-CM | POA: Diagnosis not present

## 2017-04-26 DIAGNOSIS — R471 Dysarthria and anarthria: Secondary | ICD-10-CM

## 2017-04-26 DIAGNOSIS — R2681 Unsteadiness on feet: Secondary | ICD-10-CM

## 2017-04-26 DIAGNOSIS — R4701 Aphasia: Secondary | ICD-10-CM

## 2017-04-26 NOTE — Therapy (Signed)
[facility] MAIN [facility] SERVICES [address] [place], [place], [address] Phone: [phone]   Fax:  [phone]  Occupational Therapy Treatment  Patient Details  Name: Erik [name] MRN: [id number] Date of Birth: 1956/03/26 Referring Provider: Dr. Hildred Alamin  Encounter Date: 04/26/2017      OT End of Session - 04/26/17 0937    Visit Number 11   Number of Visits 24   Date for OT Re-Evaluation 06/04/17   OT Start Time 0917   OT Stop Time 1000   OT Time Calculation (min) 43 min   Activity Tolerance Patient tolerated treatment well   Behavior During Therapy [facility] for tasks assessed/performed      Past Medical History:  Diagnosis Date  . Anxiety   . Arthritis    hands  . BPH (benign prostatic hyperplasia)   . Depression   . 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 ED (erectile dysfunction)   . GERD (gastroesophageal reflux disease)   . H/O calcium pyrophosphate deposition disease (CPPD)   . HLD (hyperlipidemia)   . Hypertension   . Hypogonadism in male   . Lower urinary tract symptoms (LUTS)   . Nocturia   . Stroke [facility])    Language Impairment.  Left Hemiparesis- improved  . Testosterone overdose   . Vitamin D deficiency     Past Surgical History:  Procedure Laterality Date  . BACK SURGERY    . COLON SURGERY     Colon Resection  . COLONOSCOPY    . IR ANGIO INTRA EXTRACRAN SEL COM CAROTID INNOMINATE UNI L MOD SED  03/07/2017  . IR ANGIO INTRA EXTRACRAN SEL COM CAROTID INNOMINATE UNI R MOD SED  02/16/2017  . IR ANGIO VERTEBRAL SEL SUBCLAVIAN INNOMINATE UNI L MOD SED  03/07/2017  . IR ANGIO VERTEBRAL SEL VERTEBRAL UNI R MOD SED  03/07/2017  . IR INFUSION THROMBOL ARTERIAL INITIAL (MS)  03/07/2017  . IR INTRAVSC STENT CERV CAROTID W/EMB-PROT MOD SED INCL ANGIO  03/07/2017  . IR PERCUTANEOUS ART THROMBECTOMY/INFUSION INTRACRANIAL INC DIAG ANGIO  02/16/2017  . IR PTA NON CORO-LOWER EXTREM  02/16/2017  . IR RADIOLOGIST  EVAL & MGMT  04/04/2017  . IR US GUIDE VASC ACCESS LEFT  02/16/2017  . IR US GUIDE VASC ACCESS RIGHT  02/16/2017  . KNEE ARTHROSCOPY Bilateral   . RADIOLOGY WITH ANESTHESIA N/A 02/16/2017   Procedure: RADIOLOGY WITH ANESTHESIA;  Surgeon: Julieanne Cotton, MD;  Location: [facility] OR;  Service: Radiology;  Laterality: N/A;  . RADIOLOGY WITH ANESTHESIA N/A 03/07/2017   Procedure: CAROTID STENT;  Surgeon: Julieanne Cotton, MD;  Location: [facility] OR;  Service: Radiology;  Laterality: N/A;  . ROTATOR CUFF REPAIR Right     There were no vitals filed for this visit.      Subjective Assessment - 04/26/17 0935    Subjective  Pt. reports stiffness today   Patient is accompained by: Family member   Pertinent History Pt. is a 61 y.o. male who had a CVA on the evening of April 12th, 2018. Pt. had been to his grandson's ball game, and was preparing for bed when he felling getting into bed. According to his wife, he struggled to get up. She could tell he was having a stroke, and called EMS. [facility] was administered TPA,. Pt. underwent a cerebral angiogram with Rt ICA stent assisted angioplasty. Pt. received Therapy services in CIR. at [facility].   Patient Stated Goals  To regain the use of his right hand   Currently in Pain? No/denies      OT TREATMENT    Neuro muscular re-education:  Pt. Worked on grasping 1/8" pegs with long nosed tweezers, turning them, and placing them on a board with his right hand. Pt. Worked on grasping and storing 1/8" pegs, and translatory skills moving the pegs through his hand. Pt. worked on Estate agent copy, and grasping 1/4" objects.                               OT Education - 04/26/17 0936    Education provided Yes   Education Details RUE strength, and [facility]   Person(s) Educated Patient   Methods Explanation   Comprehension Verbalized understanding;Returned demonstration;Verbal cues required;Tactile cues required;Need further  instruction             OT Long Term Goals - 04/24/17 1011      OT LONG TERM GOAL #1   Title Pt. will independently, and efficiently write 3 sentences with 100% legibility.   Baseline Increased time required. 90% legibility for only 2 words in large print.   Time 12   Period Weeks   Status New     OT LONG TERM GOAL #2   Title Pt. will independently type a paragraph efficiently.   Baseline Pt. is unable   Time 12   Period Weeks   Status On-going     OT LONG TERM GOAL #3   Title Pt. improve right grip strength by 5 pounds to be able to hold and use a wrench.   Baseline Pt. is unable   Time 12   Period Weeks   Status On-going     OT LONG TERM GOAL #4   Title Pt. will improve RUE strength, and hand function skills for independence with work related tasks.   Baseline Pt. is unable to apply toothpaste onto a toothbrush.   Time 12   Period Weeks   Status Revised     OT LONG TERM GOAL #5   Title Pt. will improve FMC by 10 sec. to be able to independently handle, and count change.   Baseline Pt. has difficulty   Time 12   Period Weeks   Status On-going     OT LONG TERM GOAL #6   Title Pt. will increase RUE strength by 2mm grades to be able to complete ADLs/IADLs.   Baseline Pt. has difficulty   Time 12   Period Weeks   Status On-going     OT LONG TERM GOAL #7   Title Pt. will demonstrate independence with visual compensatory strategies for the right 100% of the time during ADL, and IADL tasks.   Baseline Limited on the right. Pt. occ. misses obstacle on the right.   Time 12   Period Weeks   Status On-going               Plan - 04/26/17 4166    Clinical Impression Statement Pt. reports he is going on vacation for 10 days next Thursday. Pt. reports he would like to return to work at least part time in July. Pt. reports being more stiff. Pt. is making progress with RUE and strength, and coordination skills. Pt. continues to work on improving motor control,  and [facility] skills for improved ADL, and IADL tasks.   Occupational performance deficits (Please refer to evaluation for details): ADL's   Rehab  Potential Good   OT Frequency 2x / week   OT Duration 12 weeks   OT Treatment/Interventions Self-care/ADL training;Therapeutic exercise;Therapeutic exercises;Therapeutic activities;DME and/or AE instruction;Neuromuscular education;Patient/family education;Functional Mobility Training;Cognitive remediation/compensation;Visual/perceptual remediation/compensation;Energy conservation   Consulted and Agree with Plan of Care Patient;Family member/caregiver   Family Member Consulted --      Patient will benefit from skilled therapeutic intervention in order to improve the following deficits and impairments:  Decreased coordination, Decreased balance, Impaired UE functional use, Decreased cognition, Impaired vision/preception, Decreased strength, Decreased knowledge of use of DME, Decreased activity tolerance, Impaired tone, Impaired flexibility, Decreased range of motion, Decreased endurance  Visit Diagnosis: Muscle weakness (generalized)  Other lack of coordination    Problem List Patient Active Problem List   Diagnosis Date Noted  . Hemiparesis affecting right side as late effect of cerebrovascular accident (CVA) (HCC) 03/16/2017  . Aphasia, late effect of cerebrovascular disease 03/16/2017  . Gait disturbance, post-stroke 03/16/2017  . Dysarthria as late effect of stroke 03/16/2017  . Carotid artery stenosis with cerebral infarction (HCC) 03/07/2017  . Language impairment   . ICAO (internal carotid artery occlusion), left 02/22/2017  . Acute respiratory failure (HCC) 02/22/2017  . Obesity 02/22/2017  . Expressive aphasia 02/22/2017  . Acute ischemic left MCA stroke (HCC) 02/22/2017  . Left hemiparesis (HCC)   . Status post total bilateral knee replacement   . Benign essential HTN   . Right-sided extracranial carotid artery stenosis   .  Dysphagia, post-stroke   . Acute blood loss anemia   . Nocturia   . Newly diagnosed diabetes (HCC)   . CVA (cerebral vascular accident) (HCC) - L MCA s/p tPA and thrombectomy 02/16/2017  . Anxiety 04/30/2015  . Benign fibroma of prostate 04/30/2015  . Clinical depression 04/30/2015  . Acid reflux 04/30/2015  . HLD (hyperlipidemia) 04/30/2015  . BP (high blood pressure) 04/30/2015  . Arthritis, degenerative 04/30/2015  . 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 Arthritis, septic, knee (HCC) 09/26/2012    Olegario MessierElaine Bilaal Leib, MS, OTR/L 04/26/2017, 9:55 AM  [facility] [address] [place], [place], [address] Phone: [phone]   Fax:  [phone]  Name: Erik [name] MRN: [id number] Date of Birth: September 20, 1956

## 2017-04-26 NOTE — Therapy (Signed)
[facility] MAIN [facility] [address] [place], [place], [address] Phone: [phone]   Fax:  [phone]  Speech Language Pathology Treatment  Patient Details  Name: Erik BarreJames W [name] MRN: [id number] Date of Birth: [birth year] Referring Provider: Charlton AmorANGIULLI, DANIEL J   Encounter Date: 04/26/2017      End of Session - 04/26/17 1203    Visit Number 12   Number of Visits 20   Date for SLP Re-Evaluation 06/12/17   SLP Start Time 1047   SLP Stop Time  1145   SLP Time Calculation (min) 58 min   Activity Tolerance Patient tolerated treatment well      Past Medical History:  Diagnosis Date  . Anxiety   . Arthritis    hands  . BPH (benign prostatic hyperplasia)   . Depression   . 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 ED (erectile dysfunction)   . GERD (gastroesophageal reflux disease)   . H/O calcium pyrophosphate deposition disease (CPPD)   . HLD (hyperlipidemia)   . Hypertension   . Hypogonadism in male   . Lower urinary tract symptoms (LUTS)   . Nocturia   . Stroke [facility](HCC)    Language Impairment.  Left Hemiparesis- improved  . Testosterone overdose   . Vitamin D deficiency     Past Surgical History:  Procedure Laterality Date  . BACK SURGERY    . COLON SURGERY     Colon Resection  . COLONOSCOPY    . IR ANGIO INTRA EXTRACRAN SEL COM CAROTID INNOMINATE UNI L MOD SED  03/07/2017  . IR ANGIO INTRA EXTRACRAN SEL COM CAROTID INNOMINATE UNI R MOD SED  02/16/2017  . IR ANGIO VERTEBRAL SEL SUBCLAVIAN INNOMINATE UNI L MOD SED  03/07/2017  . IR ANGIO VERTEBRAL SEL VERTEBRAL UNI R MOD SED  03/07/2017  . IR INFUSION THROMBOL ARTERIAL INITIAL (MS)  03/07/2017  . IR INTRAVSC STENT CERV CAROTID W/EMB-PROT MOD SED INCL ANGIO  03/07/2017  . IR PERCUTANEOUS ART THROMBECTOMY/INFUSION INTRACRANIAL INC DIAG ANGIO  02/16/2017  . IR PTA NON CORO-LOWER EXTREM  02/16/2017  . IR RADIOLOGIST EVAL & MGMT  04/04/2017  . IR US GUIDE VASC  ACCESS LEFT  02/16/2017  . IR US GUIDE VASC ACCESS RIGHT  02/16/2017  . KNEE ARTHROSCOPY Bilateral   . RADIOLOGY WITH ANESTHESIA N/A 02/16/2017   Procedure: RADIOLOGY WITH ANESTHESIA;  Surgeon: Julieanne CottonSanjeev Deveshwar, MD;  Location: [facility] OR;  Service: Radiology;  Laterality: N/A;  . RADIOLOGY WITH ANESTHESIA N/A 03/07/2017   Procedure: CAROTID STENT;  Surgeon: Julieanne CottonSanjeev Deveshwar, MD;  Location: [facility] OR;  Service: Radiology;  Laterality: N/A;  . ROTATOR CUFF REPAIR Right     There were no vitals filed for this visit.      Subjective Assessment - 04/26/17 1202    Subjective Patient mentioned his wife still has to ask him to slow down in conversations sometimes.   Currently in Pain? No/denies               ADULT SLP TREATMENT - 04/26/17 0001      General Information   Behavior/Cognition Alert;Cooperative;Pleasant mood   HPI 61 year old man with left MCA CVA 02/15/2017.  Patient received inpatient rehab services, including SLP, 02/22/2017-03/02/2017.     Treatment Provided   Treatment provided Cognitive-Linquistic     Pain Assessment   Pain Assessment No/denies pain     Cognitive-Linquistic Treatment   Treatment focused on Aphasia;Dysarthria   Skilled Treatment  Patient completed a sentence completion activity and an identifying synonyms activity (both of which required him to produce short phrases out loud) with 90% intelligibility. Patient distinguished between similar word pairs and correctly produced each word with 80% phonemic accuracy. Clinician provided cues throughout the activities for clear, slow rate of speech. Patient demonstrated 80% intelligibility in conversation and the ability to self-monitor and self-correct when he is fully attending to his speech, though he still requires cueing to slow down and provide content words.     Assessment / Recommendations / Plan   Plan Continue with current plan of care     Progression Toward Goals   Progression toward goals Progressing toward  goals          SLP Education - 04/26/17 1203    Education provided Yes   Education Details Content words in conversation transitions   Person(s) Educated Patient   Methods Explanation   Comprehension Verbalized understanding            SLP Long Term Goals - 04/19/17 1231      SLP LONG TERM GOAL #1   Title Pt will improve speech intelligibility in response to moderately complex cognitive linguistic tasks by controlling rate of speech, over-articulation, and increased loudness to achieve 80% intelligibility.   Status On-going     SLP LONG TERM GOAL #2   Title Patient will read aloud sentences and multi-syllabic words, maintaining phonemic accuracy, with 80% accuracy.     Time 12   Period Weeks   Status On-going     SLP LONG TERM GOAL #3   Title Patient will demonstrate reading comprehension for paragraphs with 80% accuracy.    Time 12   Period Weeks   Status On-going     SLP LONG TERM GOAL #4   Title Patient will complete high level word finding tasks with 80% accuracy.   Time 12   Period Weeks   Status On-going          Plan - 04/26/17 1204    Clinical Impression Statement Patient corrects himself quickly when the clinician provides cues. Patient is requiring fewer cues to slow down his rate of speech, especially in short phrases or sentences. Patient still needs cueing during conversations, but when he is paying close attention to his speech he is able to self-monitor and self-correct.    Speech Therapy Frequency 2x / week   Duration Other (comment)   Treatment/Interventions Patient/family education;Compensatory strategies;SLP instruction and feedback;Functional tasks;Cueing hierarchy   Potential to Achieve Goals Good   Potential Considerations Ability to learn/carryover information;Co-morbidities;Cooperation/participation level;Medical prognosis;Previous level of function;Severity of impairments;Family/community support   SLP Home Exercise Plan Daily  conversation with family members   Consulted and Agree with Plan of Care Patient      Patient will benefit from skilled therapeutic intervention in order to improve the following deficits and impairments:   Dysarthria  Aphasia    Problem List Patient Active Problem List   Diagnosis Date Noted  . Hemiparesis affecting right side as late effect of cerebrovascular accident (CVA) (HCC) 03/16/2017  . Aphasia, late effect of cerebrovascular disease 03/16/2017  . Gait disturbance, post-stroke 03/16/2017  . Dysarthria as late effect of stroke 03/16/2017  . Carotid artery stenosis with cerebral infarction (HCC) 03/07/2017  . Language impairment   . ICAO (internal carotid artery occlusion), left 02/22/2017  . Acute respiratory failure (HCC) 02/22/2017  . Obesity 02/22/2017  . Expressive aphasia 02/22/2017  . Acute ischemic left MCA stroke (HCC) 02/22/2017  .  Left hemiparesis (HCC)   . Status post total bilateral knee replacement   . Benign essential HTN   . Right-sided extracranial carotid artery stenosis   . Dysphagia, post-stroke   . Acute blood loss anemia   . Nocturia   . Newly diagnosed diabetes (HCC)   . CVA (cerebral vascular accident) (HCC) - L MCA s/p tPA and thrombectomy 02/16/2017  . Anxiety 04/30/2015  . Benign fibroma of prostate 04/30/2015  . Clinical depression 04/30/2015  . Acid reflux 04/30/2015  . HLD (hyperlipidemia) 04/30/2015  . BP (high blood pressure) 04/30/2015  . Arthritis, degenerative 04/30/2015  . 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 Arthritis, septic, knee (HCC) 09/26/2012    Clydene Pugh 04/26/2017, 12:04 PM  [facility] MAIN [facility] SERVICES [address] [place], [place], [address] Phone: [phone]   Fax:  [phone]   Name: Erik [name] MRN: [id number] Date of Birth: 01-24-1956

## 2017-04-26 NOTE — Therapy (Signed)
[facility] MAIN [facility] SERVICES [address] [place], [place], [address] Phone: [phone]   Fax:  [phone]  Physical Therapy Treatment  Patient Details  Name: Erik [name] MRN: [id number] Date of Birth: December 15, 1955 Referring Provider: Cruzita Lederer  Encounter Date: 04/26/2017      PT End of Session - 04/26/17 1057    Visit Number 13   Number of Visits 17   Date for PT Re-Evaluation 05/09/17   PT Start Time 1007   PT Stop Time 1050   PT Time Calculation (min) 43 min   Equipment Utilized During Treatment Other (comment);Gait belt  S-min A prn   Activity Tolerance Patient tolerated treatment well   Behavior During Therapy [facility] for tasks assessed/performed      Past Medical History:  Diagnosis Date  . Anxiety   . Arthritis    hands  . BPH (benign prostatic hyperplasia)   . Depression   . 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 ED (erectile dysfunction)   . GERD (gastroesophageal reflux disease)   . H/O calcium pyrophosphate deposition disease (CPPD)   . HLD (hyperlipidemia)   . Hypertension   . Hypogonadism in male   . Lower urinary tract symptoms (LUTS)   . Nocturia   . Stroke [facility])    Language Impairment.  Left Hemiparesis- improved  . Testosterone overdose   . Vitamin D deficiency     Past Surgical History:  Procedure Laterality Date  . BACK SURGERY    . COLON SURGERY     Colon Resection  . COLONOSCOPY    . IR ANGIO INTRA EXTRACRAN SEL COM CAROTID INNOMINATE UNI L MOD SED  03/07/2017  . IR ANGIO INTRA EXTRACRAN SEL COM CAROTID INNOMINATE UNI R MOD SED  02/16/2017  . IR ANGIO VERTEBRAL SEL SUBCLAVIAN INNOMINATE UNI L MOD SED  03/07/2017  . IR ANGIO VERTEBRAL SEL VERTEBRAL UNI R MOD SED  03/07/2017  . IR INFUSION THROMBOL ARTERIAL INITIAL (MS)  03/07/2017  . IR INTRAVSC STENT CERV CAROTID W/EMB-PROT MOD SED INCL ANGIO  03/07/2017  . IR PERCUTANEOUS ART THROMBECTOMY/INFUSION INTRACRANIAL INC DIAG  ANGIO  02/16/2017  . IR PTA NON CORO-LOWER EXTREM  02/16/2017  . IR RADIOLOGIST EVAL & MGMT  04/04/2017  . IR US GUIDE VASC ACCESS LEFT  02/16/2017  . IR US GUIDE VASC ACCESS RIGHT  02/16/2017  . KNEE ARTHROSCOPY Bilateral   . RADIOLOGY WITH ANESTHESIA N/A 02/16/2017   Procedure: RADIOLOGY WITH ANESTHESIA;  Surgeon: Julieanne Cotton, MD;  Location: [facility] OR;  Service: Radiology;  Laterality: N/A;  . RADIOLOGY WITH ANESTHESIA N/A 03/07/2017   Procedure: CAROTID STENT;  Surgeon: Julieanne Cotton, MD;  Location: [facility] OR;  Service: Radiology;  Laterality: N/A;  . ROTATOR CUFF REPAIR Right     There were no vitals filed for this visit.      Subjective Assessment - 04/26/17 1056    Subjective Pt. continues to work on treadmill at home. He has a few scuffs early morning and at night when he first starts amublating.    Pertinent History Pt. is a 61 y.o. male who had a CVA on the evening of April 12th, 2018. Pt had been to his grandson's ball game that day and watched a movie with his wife. He got in bed and his wife went to take a bath. When she came out pt was on the floor struggling to get back in bed. Pt had become weak while  in bed, tried to sit up and fell out of the bed. She could tell he was having a stroke, and called EMS. [facility] and CTA head/neck revealed hyperdense L-MCA sign likely indicating acute thrombus. He received tPA and he underwent cerebral angiogram with L-MCA thrombectomy, dense calcified plaque L-ICA precluded stent placement and noted to have 73% stenosis R-ICA due to advanced atherosclerotic changes. Patient with resultant facial weakness with mild dysarthria, aphasia, mild left gaze preference and right sided weakness affecting mobility and ability to carry out ADL tasks. He was in CIR for approximately 8 days per patient and then discharged home. He has been evaluated by outpatient speech and occupational therapy and is now here with wife today for PT evaluation.     Patient Stated Goals Improve his strength and balance in order to improve his function at home and be able to return to work.    Currently in Pain? No/denies      Neuro Re-ed Crossing street simulation: 10 second->5 second timed trials, decreasing time with repetition  Step down from step (3 blocks high), step around 3 cones, step up onto step (3 blocks high)  Step down from step (3 blocks high), step around 3 cones and over one step then step up onto step (3 blocks high)  Step down from step (3 blocks high) step through narrow path, step over two consecutive steps, step up onto step (3 blocks high)  Walking on beach simulation:  Red unstable mat, crossing 4x  Red unstable mat with step prior (6") step over two consecutive half foam rollars 6x  Red unstable mat with step prior (6") step over half foam rollar onto airex pad and then over additional half foam rollar 8x, occasional LOB Recipircal arm leg marches, pt. Had difficulty combining movements so focused on 15 left leg right arm then 15 right leg left arm x 4 trials  TherEx Treadmill 5 minutes 2% incline, 1.5 mph occasional scuffs TRX squats with arm abduction at end phase 2x10, different foot position each set to maximize strengthening TRX lunges with increased extension of hip 15x Heel raises Ankle pumps x 20 Seated marches x20         PT Long Term Goals - 03/14/17 1513      PT LONG TERM GOAL #1   Title Pt will be independent with HEP in order to improve strength and balance in order to decrease fall risk and improve function at home and work.    Time 8   Period Weeks   Status New     PT LONG TERM GOAL #2   Title Pt will improve BERG by at least 3 points in order to demonstrate clinically significant improvement in balance.   Baseline 03/14/17: 49/56   Time 8   Period Weeks   Status New     PT LONG TERM GOAL #3   Title Pt will improve DGI by at least 3 points in order to demonstrate clinically significant  improvement in balance and decreased risk for falls    Baseline 03/14/17: 17/24   Time 8   Period Weeks   Status New     PT LONG TERM GOAL #4   Title Pt will improve R hip abduction, adduction, and flexion strength by at least 1/2 MMT in order to improve gait    Baseline 03/14/17: see flowsheet   Time 8   Status New  Plan - 04/26/17 1100    Clinical Impression Statement Patient continues to improve with functional balance tasks with and without df assist brace. Functional mobility interventions were performed today with pt. Demonstrating improvement with repetition. Pt. Able to cross street simulation in 5 seconds when not presented with obstacles and <10 seconds with obstacles. Ambulating on uneven surface over obstacles is challenging to patient and will continue to be an area of improvement. Patient will continue to benefit from skilled physical therapy to improve strength, balance, and gait to improve safety during functional mobility.    Rehab Potential Good   Clinical Impairments Affecting Rehab Potential Positive: age, motivation, social support; Negative: significant ICA stenosis   PT Frequency 2x / week   PT Duration 8 weeks   PT Treatment/Interventions ADLs/Self Care Home Management;Aquatic Therapy;Biofeedback;Cryotherapy;Electrical Stimulation;Iontophoresis 4mg /ml Dexamethasone;Moist Heat;Ultrasound;DME Instruction;Gait training;Stair training;Functional mobility training;Therapeutic activities;Therapeutic exercise;Balance training;Neuromuscular re-education;Patient/family education;Manual techniques;Passive range of motion;Energy conservation   PT Next Visit Plan walking across streets practice, practice walking in sand with and w/o shoes   PT Home Exercise Plan Hip 3 ways with RTB, mini squats, RTB resisted R ankle DF, toe taps, semitandem/tandem balance; Bil concentric and eccentric R calf raises; R hamstring curls wiith GTB   Consulted and Agree with Plan of Care  Patient      Patient will benefit from skilled therapeutic intervention in order to improve the following deficits and impairments:  Abnormal gait, Decreased balance, Decreased coordination, Decreased mobility, Decreased strength, Difficulty walking  Visit Diagnosis: Muscle weakness (generalized)  Other lack of coordination  Unsteadiness on feet     Problem List Patient Active Problem List   Diagnosis Date Noted  . Hemiparesis affecting right side as late effect of cerebrovascular accident (CVA) (HCC) 03/16/2017  . Aphasia, late effect of cerebrovascular disease 03/16/2017  . Gait disturbance, post-stroke 03/16/2017  . Dysarthria as late effect of stroke 03/16/2017  . Carotid artery stenosis with cerebral infarction (HCC) 03/07/2017  . Language impairment   . ICAO (internal carotid artery occlusion), left 02/22/2017  . Acute respiratory failure (HCC) 02/22/2017  . Obesity 02/22/2017  . Expressive aphasia 02/22/2017  . Acute ischemic left MCA stroke (HCC) 02/22/2017  . Left hemiparesis (HCC)   . Status post total bilateral knee replacement   . Benign essential HTN   . Right-sided extracranial carotid artery stenosis   . Dysphagia, post-stroke   . Acute blood loss anemia   . Nocturia   . Newly diagnosed diabetes (HCC)   . CVA (cerebral vascular accident) (HCC) - L MCA s/p tPA and thrombectomy 02/16/2017  . Anxiety 04/30/2015  . Benign fibroma of prostate 04/30/2015  . Clinical depression 04/30/2015  . Acid reflux 04/30/2015  . HLD (hyperlipidemia) 04/30/2015  . BP (high blood pressure) 04/30/2015  . Arthritis, degenerative 04/30/2015  . 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 Arthritis, septic, knee (HCC) 09/26/2012   Precious Bard, PT, DPT   04/26/2017, 11:01 AM   [facility] MAIN [facility] SERVICES [address] [place], [place], [address] Phone: [phone]   Fax:  [phone]  Name: Erik [name] MRN:  [id number] Date of Birth: 21-Jan-1956

## 2017-04-27 ENCOUNTER — Encounter: Payer: Self-pay | Admitting: Physical Medicine & Rehabilitation

## 2017-04-27 ENCOUNTER — Ambulatory Visit (HOSPITAL_BASED_OUTPATIENT_CLINIC_OR_DEPARTMENT_OTHER): Payer: Managed Care, Other (non HMO) | Admitting: Physical Medicine & Rehabilitation

## 2017-04-27 ENCOUNTER — Encounter: Payer: Managed Care, Other (non HMO) | Attending: Physical Medicine & Rehabilitation

## 2017-04-27 VITALS — BP 137/72 | HR 61

## 2017-04-27 DIAGNOSIS — I10 Essential (primary) hypertension: Secondary | ICD-10-CM | POA: Diagnosis not present

## 2017-04-27 DIAGNOSIS — I69351 Hemiplegia and hemiparesis following cerebral infarction affecting right dominant side: Secondary | ICD-10-CM

## 2017-04-27 DIAGNOSIS — E785 Hyperlipidemia, unspecified: Secondary | ICD-10-CM | POA: Insufficient documentation

## 2017-04-27 DIAGNOSIS — I69920 Aphasia following unspecified cerebrovascular disease: Secondary | ICD-10-CM | POA: Diagnosis not present

## 2017-04-27 DIAGNOSIS — F419 Anxiety disorder, unspecified: Secondary | ICD-10-CM | POA: Diagnosis not present

## 2017-04-27 DIAGNOSIS — E119 Type 2 diabetes mellitus without complications: Secondary | ICD-10-CM | POA: Insufficient documentation

## 2017-04-27 DIAGNOSIS — I69398 Other sequelae of cerebral infarction: Secondary | ICD-10-CM | POA: Diagnosis not present

## 2017-04-27 DIAGNOSIS — R269 Unspecified abnormalities of gait and mobility: Secondary | ICD-10-CM | POA: Diagnosis not present

## 2017-04-27 DIAGNOSIS — I69322 Dysarthria following cerebral infarction: Secondary | ICD-10-CM | POA: Insufficient documentation

## 2017-04-27 DIAGNOSIS — I69320 Aphasia following cerebral infarction: Secondary | ICD-10-CM | POA: Insufficient documentation

## 2017-04-27 NOTE — Progress Notes (Signed)
Subjective:    Patient ID: Erik [name], male    DOB: 1956/07/25, 61 y.o.   MRN: [id number] 61 y.o. male with history of BPH, OA s/p B-TKR , HTN (wife/patient deny) who was admitted on 02/16/17 after being found by wife lying on the ground with inability to speak. CTA head/neck revealed hyperdense L-MCA sign likely indicating acute thrombus. He received tPA  and he underwent cerebral angiogram with L-MCA thrombectomy, dense calcified plaque L-ICA precluded stent placement and noted to have 73% stenosis R-ICA due to advanced atherosclerotic changes. MRI/MRA brain reviewed, showing multiple areas of infarct. Carotid dopplers with right severe mixed plaque origin and proximal ICA/ECA 80-99%. He was started on DAPT for thromboembolic stroke due to large vessel disease and to plan for elective surgery post-rehab.  Patient with resultant facial weakness with mild dysarthria, aphasia, mild left gaze preference and right sided weakness affecting mobility and ability to carry out ADL tasks Admit date: 02/22/2017 Discharge date: 03/02/2017 HPI OP PT, OT, SLP At [facility], notes have been reviewed.  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  He is modified independent with all his self-care and mobility.  He has tried driving with his wife, as well as independently. He has had no issues. We discussed that he had not been released yet to drive  Patient has appointment scheduled for neurology as well as neuro interventional radiology  Pain Inventory Average Pain 0 Pain Right Now 0 My pain is no pain  In the last 24 hours, has pain interfered with the following? General activity no pain Relation with others no pain Enjoyment of life no pain What TIME of day is your pain at its worst? no pain Sleep (in general) NA  Pain is worse with: no pain Pain improves with: no pain Relief from Meds: no pain  Mobility walk without assistance how many minutes  can you walk? 60 ability to climb steps?  yes do you drive?  yes  Function disabled: date disabled currently on short term disability  Neuro/Psych No problems in this area  Prior Studies Any changes since last visit?  no  Physicians involved in your care Any changes since last visit?  no   Family History  Problem Relation Age of Onset  . Diabetes Mother   . Hypertension Mother   . Dementia Mother   . Malig Hypertension Mother   . Hyperlipidemia Mother   . Heart Problems Mother   . Heart Problems Brother    Social History   Social History  . Marital status: Married    Spouse name: N/A  . Number of children: N/A  . Years of education: N/A   Social History Main Topics  . Smoking status: Never Smoker  . Smokeless tobacco: Never Used  . Alcohol use 4.8 oz/week    6 Shots of liquor, 2 Glasses of wine per week     Comment: drinks every day vodka with water  . Drug use: No  . Sexual activity: Yes   Other Topics Concern  . None   Social History Narrative  . None   Past Surgical History:  Procedure Laterality Date  . BACK SURGERY    . COLON SURGERY     Colon Resection  . COLONOSCOPY    . IR ANGIO INTRA EXTRACRAN SEL COM CAROTID INNOMINATE UNI L MOD SED  03/07/2017  . IR ANGIO INTRA EXTRACRAN SEL COM CAROTID INNOMINATE UNI R MOD SED  02/16/2017  .  IR ANGIO VERTEBRAL SEL SUBCLAVIAN INNOMINATE UNI L MOD SED  03/07/2017  . IR ANGIO VERTEBRAL SEL VERTEBRAL UNI R MOD SED  03/07/2017  . IR INFUSION THROMBOL ARTERIAL INITIAL (MS)  03/07/2017  . IR INTRAVSC STENT CERV CAROTID W/EMB-PROT MOD SED INCL ANGIO  03/07/2017  . IR PERCUTANEOUS ART THROMBECTOMY/INFUSION INTRACRANIAL INC DIAG ANGIO  02/16/2017  . IR PTA NON CORO-LOWER EXTREM  02/16/2017  . IR RADIOLOGIST EVAL & MGMT  04/04/2017  . IR US GUIDE VASC ACCESS LEFT  02/16/2017  . IR US GUIDE VASC ACCESS RIGHT  02/16/2017  . KNEE ARTHROSCOPY Bilateral   . RADIOLOGY WITH ANESTHESIA N/A 02/16/2017   Procedure: RADIOLOGY WITH  ANESTHESIA;  Surgeon: Julieanne CottonSanjeev Deveshwar, MD;  Location: [facility] OR;  Service: Radiology;  Laterality: N/A;  . RADIOLOGY WITH ANESTHESIA N/A 03/07/2017   Procedure: CAROTID STENT;  Surgeon: Julieanne CottonSanjeev Deveshwar, MD;  Location: [facility] OR;  Service: Radiology;  Laterality: N/A;  . ROTATOR CUFF REPAIR Right    Past Medical History:  Diagnosis Date  . Anxiety   . Arthritis    hands  . BPH (benign prostatic hyperplasia)   . Depression   . 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 ED (erectile dysfunction)   . GERD (gastroesophageal reflux disease)   . H/O calcium pyrophosphate deposition disease (CPPD)   . HLD (hyperlipidemia)   . Hypertension   . Hypogonadism in male   . Lower urinary tract symptoms (LUTS)   . Nocturia   . Stroke [facility](HCC)    Language Impairment.  Left Hemiparesis- improved  . Testosterone overdose   . Vitamin D deficiency    BP 137/72   Pulse 61   SpO2 96%   Opioid Risk Score:   Fall Risk Score:  `1  Depression screen PHQ 2/9  Depression screen PHQ 2/9 04/27/2017  Decreased Interest 0  Down, Depressed, Hopeless 0  PHQ - 2 Score 0     Review of Systems  Constitutional: Negative.   HENT: Negative.   Eyes: Negative.   Respiratory: Negative.   Cardiovascular: Negative.   Gastrointestinal: Negative.   Endocrine: Negative.   Genitourinary: Negative.   Musculoskeletal: Negative.   Skin: Negative.   Allergic/Immunologic: Negative.   Neurological: Negative.   Hematological: Negative.   Psychiatric/Behavioral: Negative.   All other systems reviewed and are negative.      Objective:   Physical Exam  Constitutional: He appears well-developed and well-nourished.  HENT:  Head: Normocephalic and atraumatic.  Eyes: Conjunctivae and EOM are normal. Pupils are equal, round, and reactive to light.  Extraocular muscles intact. Visual fields are intact confrontation testing  Neurological: No sensory deficit. Coordination abnormal.  Motor strength is  4/5 in the right deltoid, biceps, triceps, grip, hip flexor, knee extensor, ankle dorsiflexor. There is distinct decreased fine motor in the right finger. Thumb opposition. Positive dysdiadochokinesis with rapid alternating supination, pronation, right upper extremity. 5/5 strength in bilateral hip flexors, knee extensors, ankle dorsiflexors 5/5 left deltoid by stress, grip  Gait he is wide-base gait, but no evidence to regular knee instability. Able to toe walk, heel walk.  Psychiatric: He has a normal mood and affect.  Nursing note and vitals reviewed.         Assessment & Plan:  1. 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 Overall he has made excellent improvements. He is now modified independent level. I do think he is  safe to go back to driving as noted below. Given his job requirements. He may be ready to return to work in approximately 1 month. We'll reassess in 1 month  Graduated return to driving instructions were provided. It is recommended that the patient first drives with another licensed driver in an empty parking lot. If the patient does well with this, and they can drive on a quiet street with the licensed driver. If the patient does well with this they can drive on a busy street with a licensed driver. If the patient does well with this, the next time out they can go by himself. For the first month after resuming driving, I recommend no nighttime or Interstate driving.

## 2017-04-27 NOTE — Patient Instructions (Signed)

## 2017-05-01 ENCOUNTER — Ambulatory Visit: Payer: Managed Care, Other (non HMO)

## 2017-05-01 ENCOUNTER — Encounter: Payer: Self-pay | Admitting: Speech Pathology

## 2017-05-01 ENCOUNTER — Ambulatory Visit: Payer: Managed Care, Other (non HMO) | Admitting: Speech Pathology

## 2017-05-01 ENCOUNTER — Ambulatory Visit: Payer: Managed Care, Other (non HMO) | Admitting: Occupational Therapy

## 2017-05-01 DIAGNOSIS — R471 Dysarthria and anarthria: Secondary | ICD-10-CM

## 2017-05-01 DIAGNOSIS — R2681 Unsteadiness on feet: Secondary | ICD-10-CM

## 2017-05-01 DIAGNOSIS — R278 Other lack of coordination: Secondary | ICD-10-CM

## 2017-05-01 DIAGNOSIS — M6281 Muscle weakness (generalized): Secondary | ICD-10-CM

## 2017-05-01 DIAGNOSIS — R4701 Aphasia: Secondary | ICD-10-CM

## 2017-05-01 NOTE — Therapy (Signed)
[facility] MAIN [facility] SERVICES [address] [place], [place], [address] Phone: [phone]   Fax:  [phone]  Physical Therapy Treatment  Patient Details  Name: Erik [name] MRN: [id number] Date of Birth: 01-10-1956 Referring Provider: Cruzita Lederer  Encounter Date: 05/01/2017      PT End of Session - 05/01/17 1156    Visit Number 14   Number of Visits 17   Date for PT Re-Evaluation 05/09/17   PT Start Time 1007   PT Stop Time 1045   PT Time Calculation (min) 38 min   Equipment Utilized During Treatment Other (comment);Gait belt  S-min A prn   Activity Tolerance Patient tolerated treatment well   Behavior During Therapy [facility] for tasks assessed/performed      Past Medical History:  Diagnosis Date  . Anxiety   . Arthritis    hands  . BPH (benign prostatic hyperplasia)   . Depression   . 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 ED (erectile dysfunction)   . GERD (gastroesophageal reflux disease)   . H/O calcium pyrophosphate deposition disease (CPPD)   . HLD (hyperlipidemia)   . Hypertension   . Hypogonadism in male   . Lower urinary tract symptoms (LUTS)   . Nocturia   . Stroke [facility])    Language Impairment.  Left Hemiparesis- improved  . Testosterone overdose   . Vitamin D deficiency     Past Surgical History:  Procedure Laterality Date  . BACK SURGERY    . COLON SURGERY     Colon Resection  . COLONOSCOPY    . IR ANGIO INTRA EXTRACRAN SEL COM CAROTID INNOMINATE UNI L MOD SED  03/07/2017  . IR ANGIO INTRA EXTRACRAN SEL COM CAROTID INNOMINATE UNI R MOD SED  02/16/2017  . IR ANGIO VERTEBRAL SEL SUBCLAVIAN INNOMINATE UNI L MOD SED  03/07/2017  . IR ANGIO VERTEBRAL SEL VERTEBRAL UNI R MOD SED  03/07/2017  . IR INFUSION THROMBOL ARTERIAL INITIAL (MS)  03/07/2017  . IR INTRAVSC STENT CERV CAROTID W/EMB-PROT MOD SED INCL ANGIO  03/07/2017  . IR PERCUTANEOUS ART THROMBECTOMY/INFUSION INTRACRANIAL INC DIAG  ANGIO  02/16/2017  . IR PTA NON CORO-LOWER EXTREM  02/16/2017  . IR RADIOLOGIST EVAL & MGMT  04/04/2017  . IR US GUIDE VASC ACCESS LEFT  02/16/2017  . IR US GUIDE VASC ACCESS RIGHT  02/16/2017  . KNEE ARTHROSCOPY Bilateral   . RADIOLOGY WITH ANESTHESIA N/A 02/16/2017   Procedure: RADIOLOGY WITH ANESTHESIA;  Surgeon: Julieanne Cotton, MD;  Location: [facility] OR;  Service: Radiology;  Laterality: N/A;  . RADIOLOGY WITH ANESTHESIA N/A 03/07/2017   Procedure: CAROTID STENT;  Surgeon: Julieanne Cotton, MD;  Location: [facility] OR;  Service: Radiology;  Laterality: N/A;  . ROTATOR CUFF REPAIR Right     There were no vitals filed for this visit.      Subjective Assessment - 05/01/17 1150    Subjective Patient has been ambulating on treadmill at home without df assist. Overdid it over this weekend due to pt. attending a football tournament sunday and doing yardwork yesterday   Pertinent History Pt. is a 61 y.o. male who had a CVA on the evening of April 12th, 2018. Pt had been to his grandson's ball game that day and watched a movie with his wife. He got in bed and his wife went to take a bath. When she came out pt was on the floor struggling to get back in bed. Pt  had become weak while in bed, tried to sit up and fell out of the bed. She could tell he was having a stroke, and called EMS. Pt was transported to [facility] and CTA head/neck revealed hyperdense L-MCA sign likely indicating acute thrombus. He received tPA and he underwent cerebral angiogram with L-MCA thrombectomy, dense calcified plaque L-ICA precluded stent placement and noted to have 73% stenosis R-ICA due to advanced atherosclerotic changes. Patient with resultant facial weakness with mild dysarthria, aphasia, mild left gaze preference and right sided weakness affecting mobility and ability to carry out ADL tasks. He was in CIR for approximately 8 days per patient and then discharged home. He has been evaluated by outpatient speech and occupational therapy  and is now here with wife today for PT evaluation.    Patient Stated Goals Improve his strength and balance in order to improve his function at home and be able to return to work.    Currently in Pain? No/denies     Neuro Re-ed Crossing street simulation: 10 second->5 second timed trials, decreasing time with repetition             Step down from step (4 blocks high), step around 3 cones, step up onto step (4 blocks high)             Step down from step (4 blocks high), step around 3 cones and over one step then step up onto step (4 blocks high)             Step down from step (4 blocks high) step through narrow path, step over two consecutive steps, step up onto step (4 blocks high) -performed each 2x with flip flops on instead and 3 block high steps   Walking on beach simulation:             Red unstable mat, crossing 4x             Red unstable mat step over foam rollar x6 no LOB   TherEx Treadmill 5 minutes 2% incline, 2.0 mph occasional scuffs TRX squats with arm abduction at end phase 2x10, different foot position each set to maximize strengthening TRX lunges with increased extension of hip 15x Ankle pumps x 20     Pt. response to medical necessity:  Patient will continue to benefit from skilled physical therapy to improve strength, balance, and gait to improve safety during functional mobility.         PT Long Term Goals - 03/14/17 1513      PT LONG TERM GOAL #1   Title Pt will be independent with HEP in order to improve strength and balance in order to decrease fall risk and improve function at home and work.    Time 8   Period Weeks   Status New     PT LONG TERM GOAL #2   Title Pt will improve BERG by at least 3 points in order to demonstrate clinically significant improvement in balance.   Baseline 03/14/17: 49/56   Time 8   Period Weeks   Status New     PT LONG TERM GOAL #3   Title Pt will improve DGI by at least 3 points in order to demonstrate clinically  significant improvement in balance and decreased risk for falls    Baseline 03/14/17: 17/24   Time 8   Period Weeks   Status New     PT LONG TERM GOAL #4   Title Pt will improve R  hip abduction, adduction, and flexion strength by at least 1/2 MMT in order to improve gait    Baseline 03/14/17: see flowsheet   Time 8   Status New               Plan - 05/01/17 1157    Clinical Impression Statement Patient presents with additional fatigue from weekend activities. Progression of functional task challenges was implemented with pt. Demonstrating good control of body mechanics with a few episodes of backwards LOB. Patient will continue to benefit from skilled physical therapy to improve strength, balance, and gait to improve safety during functional mobility.    Rehab Potential Good   Clinical Impairments Affecting Rehab Potential Positive: age, motivation, social support; Negative: significant ICA stenosis   PT Frequency 2x / week   PT Duration 8 weeks   PT Treatment/Interventions ADLs/Self Care Home Management;Aquatic Therapy;Biofeedback;Cryotherapy;Electrical Stimulation;Iontophoresis 4mg /ml Dexamethasone;Moist Heat;Ultrasound;DME Instruction;Gait training;Stair training;Functional mobility training;Therapeutic activities;Therapeutic exercise;Balance training;Neuromuscular re-education;Patient/family education;Manual techniques;Passive range of motion;Energy conservation   PT Next Visit Plan walking across streets practice, practice walking in sand with and w/o shoes   PT Home Exercise Plan Hip 3 ways with RTB, mini squats, RTB resisted R ankle DF, toe taps, semitandem/tandem balance; Bil concentric and eccentric R calf raises; R hamstring curls wiith GTB   Consulted and Agree with Plan of Care Patient      Patient will benefit from skilled therapeutic intervention in order to improve the following deficits and impairments:  Abnormal gait, Decreased balance, Decreased coordination, Decreased  mobility, Decreased strength, Difficulty walking  Visit Diagnosis: Muscle weakness (generalized)  Other lack of coordination  Unsteadiness on feet     Problem List Patient Active Problem List   Diagnosis Date Noted  . Hemiparesis affecting right side as late effect of cerebrovascular accident (CVA) (HCC) 03/16/2017  . Aphasia, late effect of cerebrovascular disease 03/16/2017  . Gait disturbance, post-stroke 03/16/2017  . Dysarthria as late effect of stroke 03/16/2017  . Carotid artery stenosis with cerebral infarction (HCC) 03/07/2017  . Language impairment   . ICAO (internal carotid artery occlusion), left 02/22/2017  . Acute respiratory failure (HCC) 02/22/2017  . Obesity 02/22/2017  . Expressive aphasia 02/22/2017  . Acute ischemic left MCA stroke (HCC) 02/22/2017  . Left hemiparesis (HCC)   . Status post total bilateral knee replacement   . Benign essential HTN   . Right-sided extracranial carotid artery stenosis   . Dysphagia, post-stroke   . Acute blood loss anemia   . Nocturia   . Newly diagnosed diabetes (HCC)   . CVA (cerebral vascular accident) (HCC) - L MCA s/p tPA and thrombectomy 02/16/2017  . Anxiety 04/30/2015  . Benign fibroma of prostate 04/30/2015  . Clinical depression 04/30/2015  . Acid reflux 04/30/2015  . HLD (hyperlipidemia) 04/30/2015  . BP (high blood pressure) 04/30/2015  . Arthritis, degenerative 04/30/2015  . 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 Arthritis, septic, knee (HCC) 09/26/2012   Precious BardMarina Fayette Hamada, PT, DPT   05/01/2017, 11:59 AM  [facility] MAIN [facility] [address] [place], [place], [address] Phone: [phone]   Fax:  [phone]  Name: Erik [name] MRN: [id number] Date of Birth: 14-Dec-1955

## 2017-05-01 NOTE — Therapy (Signed)
[facility] MAIN [facility] [address] [place], [place], [address] Phone: [phone]   Fax:  [phone]  Occupational Therapy Treatment  Patient Details  Name: Erik [name] MRN: [id number] Date of Birth: 1956/10/17 Referring Provider: Dr. Hildred AlaminAngiulli  Encounter Date: 05/01/2017      OT End of Session - 05/01/17 0940    Visit Number 12   Number of Visits 24   Date for OT Re-Evaluation 06/04/17   OT Start Time 0916   OT Stop Time 1000   OT Time Calculation (min) 44 min   Activity Tolerance Patient tolerated treatment well   Behavior During [facility] for tasks assessed/performed      Past Medical History:  Diagnosis Date  . Anxiety   . Arthritis    hands  . BPH (benign prostatic hyperplasia)   . Depression   . 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 ED (erectile dysfunction)   . GERD (gastroesophageal reflux disease)   . H/O calcium pyrophosphate deposition disease (CPPD)   . HLD (hyperlipidemia)   . Hypertension   . Hypogonadism in male   . Lower urinary tract symptoms (LUTS)   . Nocturia   . Stroke [facility](HCC)    Language Impairment.  Left Hemiparesis- improved  . Testosterone overdose   . Vitamin D deficiency     Past Surgical History:  Procedure Laterality Date  . BACK SURGERY    . COLON SURGERY     Colon Resection  . COLONOSCOPY    . IR ANGIO INTRA EXTRACRAN SEL COM CAROTID INNOMINATE UNI L MOD SED  03/07/2017  . IR ANGIO INTRA EXTRACRAN SEL COM CAROTID INNOMINATE UNI R MOD SED  02/16/2017  . IR ANGIO VERTEBRAL SEL SUBCLAVIAN INNOMINATE UNI L MOD SED  03/07/2017  . IR ANGIO VERTEBRAL SEL VERTEBRAL UNI R MOD SED  03/07/2017  . IR INFUSION THROMBOL ARTERIAL INITIAL (MS)  03/07/2017  . IR INTRAVSC STENT CERV CAROTID W/EMB-PROT MOD SED INCL ANGIO  03/07/2017  . IR PERCUTANEOUS ART THROMBECTOMY/INFUSION INTRACRANIAL INC DIAG ANGIO  02/16/2017  . IR PTA NON CORO-LOWER EXTREM  02/16/2017  . IR RADIOLOGIST  EVAL & MGMT  04/04/2017  . IR US GUIDE VASC ACCESS LEFT  02/16/2017  . IR US GUIDE VASC ACCESS RIGHT  02/16/2017  . KNEE ARTHROSCOPY Bilateral   . RADIOLOGY WITH ANESTHESIA N/A 02/16/2017   Procedure: RADIOLOGY WITH ANESTHESIA;  Surgeon: Julieanne CottonSanjeev Deveshwar, MD;  Location: [facility] OR;  Service: Radiology;  Laterality: N/A;  . RADIOLOGY WITH ANESTHESIA N/A 03/07/2017   Procedure: CAROTID STENT;  Surgeon: Julieanne CottonSanjeev Deveshwar, MD;  Location: [facility] OR;  Service: Radiology;  Laterality: N/A;  . ROTATOR CUFF REPAIR Right     There were no vitals filed for this visit.      Subjective Assessment - 05/01/17 0938    Subjective  Pt. reports he is going to the beach on Thursday.   Patient is accompained by: Family member   Pertinent History Pt. is a 61 y.o. male who had a CVA on the evening of April 12th, 2018. Pt. had been to his grandson's ball game, and was preparing for bed when he felling getting into bed. According to his wife, he struggled to get up. She could tell he was having a stroke, and called EMS. [facility] was administered TPA,. Pt. underwent a cerebral angiogram with Rt ICA stent assisted angioplasty. Pt. [facility] Therapy [facility]. at [facility].   Currently in Pain? No/denies      OT TREATMENT:  Therapeutic Exercise:  Pt. Worked on the [employer] for 8 min. With constant monitoring of the BUEs. Pt. Worked on changing, and alternating forward reverse position every 2 min. Rest breaks were required. Pt. performed gross gripping with grip strengthener. Pt. worked on sustaining grip while grasping pegs and reaching at various heights. Gripper was placed in the 4th resistive slot with the white resistive spring.  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 Pt. performed prewriting warmup exercises with his wrist, and fingers with the pen in his hand. Pt. worked on [employer] focusing on legibility, and accuracy.   Pt. Tends to write initially with  his forearm, wrist, and digits stiff, and requires verbal cues for positioning, wrist stabilization, and digit movement while holding the pen, and formulating script.                              OT Education - 05/01/17 0940    Education provided Yes   Education Details Pt. education wbout writing, [facility].   Person(s) Educated Patient   Methods Explanation   Comprehension Verbalized understanding             OT Long Term Goals - 04/24/17 1011      OT LONG TERM GOAL #1   Title Pt. will independently, and efficiently write 3 sentences with 100% legibility.   Baseline Increased time required. 90% legibility for only 2 words in large print.   Time 12   Period Weeks   Status New     OT LONG TERM GOAL #2   Title Pt. will independently type a paragraph efficiently.   Baseline Pt. is unable   Time 12   Period Weeks   Status On-going     OT LONG TERM GOAL #3   Title Pt. improve right grip strength by 5 pounds to be able to hold and use a wrench.   Baseline Pt. is unable   Time 12   Period Weeks   Status On-going     OT LONG TERM GOAL #4   Title Pt. will improve RUE strength, and hand function skills for independence with work related tasks.   Baseline Pt. is unable to apply toothpaste onto a toothbrush.   Time 12   Period Weeks   Status Revised     OT LONG TERM GOAL #5   Title Pt. will improve FMC by 10 sec. to be able to independently handle, and count change.   Baseline Pt. has difficulty   Time 12   Period Weeks   Status On-going     OT LONG TERM GOAL #6   Title Pt. will increase RUE strength by 2mm grades to be able to complete ADLs/IADLs.   Baseline Pt. has difficulty   Time 12   Period Weeks   Status On-going     OT LONG TERM GOAL #7   Title Pt. will demonstrate independence with visual compensatory strategies for the right 100% of the time during ADL, and IADL tasks.   Baseline Limited on the right. Pt. occ. misses obstacle on the  right.   Time 12   Period Weeks   Status On-going               Plan - 05/01/17 0941    Clinical Impression Statement Pt. reports he leaves for vacation to  the beach this weekend for 10 days. Pt. reports he has started back on his arthritis medicine secondary to stiffness in his RUE. Pt. is working on  Mining engineer with emphasis on veering from writing with whole UE as a unit secondary to stiffness. Pt. continues to work on improving RUE strength, and coordination skills for improved engagement in ADL, and IADL tasks.   Occupational performance deficits (Please refer to evaluation for details): ADL's   Rehab Potential Good   OT Frequency 2x / week   OT Duration 12 weeks   OT Treatment/Interventions Self-care/ADL training;Therapeutic exercise;Therapeutic exercises;Therapeutic activities;DME and/or AE instruction;Neuromuscular education;Patient/family education;Functional Mobility Training;Cognitive remediation/compensation;Visual/perceptual remediation/compensation;Energy conservation   Consulted and Agree with Plan of Care Patient;Family member/caregiver      Patient will benefit from skilled therapeutic intervention in order to improve the following deficits and impairments:  Decreased coordination, Decreased balance, Impaired UE functional use, Decreased cognition, Impaired vision/preception, Decreased strength, Decreased knowledge of use of DME, Decreased activity tolerance, Impaired tone, Impaired flexibility, Decreased range of motion, Decreased endurance  Visit Diagnosis: Muscle weakness (generalized)  Other lack of coordination    Problem List Patient Active Problem List   Diagnosis Date Noted  . Hemiparesis affecting right side as late effect of cerebrovascular accident (CVA) (HCC) 03/16/2017  . Aphasia, late effect of cerebrovascular disease 03/16/2017  . Gait disturbance, post-stroke 03/16/2017  . Dysarthria as late effect of stroke 03/16/2017  . Carotid artery  stenosis with cerebral infarction (HCC) 03/07/2017  . Language impairment   . ICAO (internal carotid artery occlusion), left 02/22/2017  . Acute respiratory failure (HCC) 02/22/2017  . Obesity 02/22/2017  . Expressive aphasia 02/22/2017  . Acute ischemic left MCA stroke (HCC) 02/22/2017  . Left hemiparesis (HCC)   . Status post total bilateral knee replacement   . Benign essential HTN   . Right-sided extracranial carotid artery stenosis   . Dysphagia, post-stroke   . Acute blood loss anemia   . Nocturia   . Newly diagnosed diabetes (HCC)   . CVA (cerebral vascular accident) (HCC) - L MCA s/p tPA and thrombectomy 02/16/2017  . Anxiety 04/30/2015  . Benign fibroma of prostate 04/30/2015  . Clinical depression 04/30/2015  . Acid reflux 04/30/2015  . HLD (hyperlipidemia) 04/30/2015  . BP (high blood pressure) 04/30/2015  . Arthritis, degenerative 04/30/2015  . 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 Arthritis, septic, knee (HCC) 09/26/2012    Olegario Messier, MS, OTR/L 05/01/2017, 5:18 PM  [place] [facility] MAIN [facility] SERVICES [address] [place], [place], [address] Phone: [phone]   Fax:  [phone]  Name: DEANNA BOEHLKE MRN: [id number] Date of Birth: [birth year]

## 2017-05-01 NOTE — Therapy (Signed)
[place] [facility] MAIN [facility] SERVICES [address] [place], [place], [address] Phone: [phone]   Fax:  [phone]  Speech Language Pathology Treatment  Patient Details  Name: Erik [name] MRN: [id number] Date of Birth: 1956/10/21 Referring Provider: Charlton Amor   Encounter Date: 05/01/2017      End of Session - 05/01/17 1208    Visit Number 13   Number of Visits 20   Date for SLP Re-Evaluation 06/12/17   SLP Start Time 1045   SLP Stop Time  1145   SLP Time Calculation (min) 60 min   Activity Tolerance Patient tolerated treatment well      Past Medical History:  Diagnosis Date  . Anxiety   . Arthritis    hands  . BPH (benign prostatic hyperplasia)   . Depression   . 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 ED (erectile dysfunction)   . GERD (gastroesophageal reflux disease)   . H/O calcium pyrophosphate deposition disease (CPPD)   . HLD (hyperlipidemia)   . Hypertension   . Hypogonadism in male   . Lower urinary tract symptoms (LUTS)   . Nocturia   . Stroke [facility])    Language Impairment.  Left Hemiparesis- improved  . Testosterone overdose   . Vitamin D deficiency     Past Surgical History:  Procedure Laterality Date  . BACK SURGERY    . COLON SURGERY     Colon Resection  . COLONOSCOPY    . IR ANGIO INTRA EXTRACRAN SEL COM CAROTID INNOMINATE UNI L MOD SED  03/07/2017  . IR ANGIO INTRA EXTRACRAN SEL COM CAROTID INNOMINATE UNI R MOD SED  02/16/2017  . IR ANGIO VERTEBRAL SEL SUBCLAVIAN INNOMINATE UNI L MOD SED  03/07/2017  . IR ANGIO VERTEBRAL SEL VERTEBRAL UNI R MOD SED  03/07/2017  . IR INFUSION THROMBOL ARTERIAL INITIAL (MS)  03/07/2017  . IR INTRAVSC STENT CERV CAROTID W/EMB-PROT MOD SED INCL ANGIO  03/07/2017  . IR PERCUTANEOUS ART THROMBECTOMY/INFUSION INTRACRANIAL INC DIAG ANGIO  02/16/2017  . IR PTA NON CORO-LOWER EXTREM  02/16/2017  . IR RADIOLOGIST EVAL & MGMT  04/04/2017  . IR US GUIDE VASC  ACCESS LEFT  02/16/2017  . IR US GUIDE VASC ACCESS RIGHT  02/16/2017  . KNEE ARTHROSCOPY Bilateral   . RADIOLOGY WITH ANESTHESIA N/A 02/16/2017   Procedure: RADIOLOGY WITH ANESTHESIA;  Surgeon: Julieanne Cotton, MD;  Location: [facility] OR;  Service: Radiology;  Laterality: N/A;  . RADIOLOGY WITH ANESTHESIA N/A 03/07/2017   Procedure: CAROTID STENT;  Surgeon: Julieanne Cotton, MD;  Location: [facility] OR;  Service: Radiology;  Laterality: N/A;  . ROTATOR CUFF REPAIR Right     There were no vitals filed for this visit.      Subjective Assessment - 05/01/17 1208    Subjective Patient said he was tired from the previous appointment.   Currently in Pain? No/denies               ADULT SLP TREATMENT - 05/01/17 0001      General Information   Behavior/Cognition Alert;Cooperative;Pleasant mood   HPI 61 year old man with left MCA CVA 02/15/2017.  Patient received inpatient rehab services, including SLP, 02/22/2017-03/02/2017.     Treatment Provided   Treatment provided Cognitive-Linquistic     Pain Assessment   Pain Assessment No/denies pain     Cognitive-Linquistic Treatment   Treatment focused on Aphasia;Dysarthria   Skilled Treatment Patient completed a sentence completion activity  with 90% intelligibility and 85% grammatical accuracy. Patient read aloud with 75% accuracy during this activity. Clinician provided minimal cueing for attention redirection. Patient answered concrete and abstract reasoning questions with 85% intelligibility. Clinician provided cues throughout the activities for clear, slow rate of speech. Patient demonstrated 80% intelligibility in conversation and the ability to self-monitor and self-correct when he is fully attending to his speech with minimal cues from the clinician.     Assessment / Recommendations / Plan   Plan Continue with current plan of care     Progression Toward Goals   Progression toward goals Progressing toward goals          SLP Education -  05/01/17 1208    Education provided Yes   Education Details Slowing down in conversation by breathing more   Person(s) Educated Patient   Methods Explanation   Comprehension Verbalized understanding            SLP Long Term Goals - 04/19/17 1231      SLP LONG TERM GOAL #1   Title Pt will improve speech intelligibility in response to moderately complex cognitive linguistic tasks by controlling rate of speech, over-articulation, and increased loudness to achieve 80% intelligibility.   Status On-going     SLP LONG TERM GOAL #2   Title Patient will read aloud sentences and multi-syllabic words, maintaining phonemic accuracy, with 80% accuracy.     Time 12   Period Weeks   Status On-going     SLP LONG TERM GOAL #3   Title Patient will demonstrate reading comprehension for paragraphs with 80% accuracy.    Time 12   Period Weeks   Status On-going     SLP LONG TERM GOAL #4   Title Patient will complete high level word finding tasks with 80% accuracy.   Time 12   Period Weeks   Status On-going          Plan - 05/01/17 1210    Clinical Impression Statement Patient is completing functional tasks with increased intelligibility. Patient needs to attend to his speech with more focus in order to self-monitor more successfully. Patient corrects himself quickly when the clinician provides cues. Patient still needs cueing mostly during conversations, but when he is paying close attention to his speech he is able to self-monitor and self-correct.   Speech Therapy Frequency 2x / week   Duration Other (comment)   Treatment/Interventions Patient/family education;Compensatory strategies;SLP instruction and feedback;Functional tasks;Cueing hierarchy   Potential to Achieve Goals Good   Potential Considerations Ability to learn/carryover information;Co-morbidities;Cooperation/participation level;Medical prognosis;Previous level of function;Severity of impairments;Family/community support   SLP  Home Exercise Plan Daily conversation with family members   Consulted and Agree with Plan of Care Patient      Patient will benefit from skilled therapeutic intervention in order to improve the following deficits and impairments:   Dysarthria  Aphasia    Problem List Patient Active Problem List   Diagnosis Date Noted  . Hemiparesis affecting right side as late effect of cerebrovascular accident (CVA) (HCC) 03/16/2017  . Aphasia, late effect of cerebrovascular disease 03/16/2017  . Gait disturbance, post-stroke 03/16/2017  . Dysarthria as late effect of stroke 03/16/2017  . Carotid artery stenosis with cerebral infarction (HCC) 03/07/2017  . Language impairment   . ICAO (internal carotid artery occlusion), left 02/22/2017  . Acute respiratory failure (HCC) 02/22/2017  . Obesity 02/22/2017  . Expressive aphasia 02/22/2017  . Acute ischemic left MCA stroke (HCC) 02/22/2017  . Left hemiparesis (HCC)   .  Status post total bilateral knee replacement   . Benign essential HTN   . Right-sided extracranial carotid artery stenosis   . Dysphagia, post-stroke   . Acute blood loss anemia   . Nocturia   . Newly diagnosed diabetes (HCC)   . CVA (cerebral vascular accident) (HCC) - L MCA s/p tPA and thrombectomy 02/16/2017  . Anxiety 04/30/2015  . Benign fibroma of prostate 04/30/2015  . Clinical depression 04/30/2015  . Acid reflux 04/30/2015  . HLD (hyperlipidemia) 04/30/2015  . BP (high blood pressure) 04/30/2015  . Arthritis, degenerative 04/30/2015  . 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 Arthritis, septic, knee (HCC) 09/26/2012    Clydene Pugh 05/01/2017, 12:11 PM  [place] [facility] MAIN [facility] SERVICES [address] [place], [place], [address] Phone: [phone]   Fax:  [phone]   Name: BLANE WORTHINGTON MRN: [id number] Date of Birth: June 05, 1956

## 2017-05-03 ENCOUNTER — Ambulatory Visit: Payer: Managed Care, Other (non HMO) | Admitting: Speech Pathology

## 2017-05-03 ENCOUNTER — Ambulatory Visit: Payer: Managed Care, Other (non HMO) | Admitting: Occupational Therapy

## 2017-05-03 ENCOUNTER — Ambulatory Visit: Payer: Managed Care, Other (non HMO)

## 2017-05-03 ENCOUNTER — Encounter: Payer: Self-pay | Admitting: Speech Pathology

## 2017-05-03 ENCOUNTER — Encounter: Payer: Self-pay | Admitting: Occupational Therapy

## 2017-05-03 DIAGNOSIS — R2681 Unsteadiness on feet: Secondary | ICD-10-CM

## 2017-05-03 DIAGNOSIS — M6281 Muscle weakness (generalized): Secondary | ICD-10-CM

## 2017-05-03 DIAGNOSIS — R471 Dysarthria and anarthria: Secondary | ICD-10-CM

## 2017-05-03 DIAGNOSIS — R278 Other lack of coordination: Secondary | ICD-10-CM

## 2017-05-03 DIAGNOSIS — I69351 Hemiplegia and hemiparesis following cerebral infarction affecting right dominant side: Secondary | ICD-10-CM

## 2017-05-03 DIAGNOSIS — R4701 Aphasia: Secondary | ICD-10-CM

## 2017-05-03 NOTE — Therapy (Signed)
[place] [facility] MAIN [facility] SERVICES [address] [place], [place], [address] Phone: [phone]   Fax:  [phone]  Occupational Therapy Treatment  Patient Details  Name: Erik [name] MRN: [id number] Date of Birth: 1955-12-07 Referring Provider: Dr. Hildred Alamin  Encounter Date: 05/03/2017      OT End of Session - 05/03/17 1127    Visit Number 13   Number of Visits 24   Date for OT Re-Evaluation 06/04/17   OT Start Time 0913   OT Stop Time 1000   OT Time Calculation (min) 47 min   Activity Tolerance Patient tolerated treatment well   Behavior During Therapy [facility] for tasks assessed/performed      Past Medical History:  Diagnosis Date  . Anxiety   . Arthritis    hands  . BPH (benign prostatic hyperplasia)   . Depression   . 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 ED (erectile dysfunction)   . GERD (gastroesophageal reflux disease)   . H/O calcium pyrophosphate deposition disease (CPPD)   . HLD (hyperlipidemia)   . Hypertension   . Hypogonadism in male   . Lower urinary tract symptoms (LUTS)   . Nocturia   . Stroke [facility])    Language Impairment.  Left Hemiparesis- improved  . Testosterone overdose   . Vitamin D deficiency     Past Surgical History:  Procedure Laterality Date  . BACK SURGERY    . COLON SURGERY     Colon Resection  . COLONOSCOPY    . IR ANGIO INTRA EXTRACRAN SEL COM CAROTID INNOMINATE UNI L MOD SED  03/07/2017  . IR ANGIO INTRA EXTRACRAN SEL COM CAROTID INNOMINATE UNI R MOD SED  02/16/2017  . IR ANGIO VERTEBRAL SEL SUBCLAVIAN INNOMINATE UNI L MOD SED  03/07/2017  . IR ANGIO VERTEBRAL SEL VERTEBRAL UNI R MOD SED  03/07/2017  . IR INFUSION THROMBOL ARTERIAL INITIAL (MS)  03/07/2017  . IR INTRAVSC STENT CERV CAROTID W/EMB-PROT MOD SED INCL ANGIO  03/07/2017  . IR PERCUTANEOUS ART THROMBECTOMY/INFUSION INTRACRANIAL INC DIAG ANGIO  02/16/2017  . IR PTA NON CORO-LOWER EXTREM  02/16/2017  . IR RADIOLOGIST  EVAL & MGMT  04/04/2017  . IR US GUIDE VASC ACCESS LEFT  02/16/2017  . IR US GUIDE VASC ACCESS RIGHT  02/16/2017  . KNEE ARTHROSCOPY Bilateral   . RADIOLOGY WITH ANESTHESIA N/A 02/16/2017   Procedure: RADIOLOGY WITH ANESTHESIA;  Surgeon: Julieanne Cotton, MD;  Location: [facility] OR;  Service: Radiology;  Laterality: N/A;  . RADIOLOGY WITH ANESTHESIA N/A 03/07/2017   Procedure: CAROTID STENT;  Surgeon: Julieanne Cotton, MD;  Location: [facility] OR;  Service: Radiology;  Laterality: N/A;  . ROTATOR CUFF REPAIR Right     There were no vitals filed for this visit.      Subjective Assessment - 05/03/17 1119    Subjective  Patient reports he is hoping to go back to work soon in July.  He is going to the beach leaving today with his family and will not be back until the 9th.  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    Pertinent History Pt. is a 61 y.o. male who had a CVA on the evening of April 12th, 2018. Pt. had been to his grandson's ball game, and was preparing for bed when he felling getting into bed. According to his wife, he struggled to  get up. She could tell he was having a stroke, and called EMS. Pt. was transported to [facility],, was administered TPA,. Pt. underwent a cerebral angiogram with Rt ICA stent assisted angioplasty. Pt. received Therapy services in CIR. at [facility].   Patient Stated Goals To regain the use of his right hand   Currently in Pain? No/denies   Pain Score 0-No pain                      OT Treatments/Exercises (OP) - 05/03/17 1121      ADLs   ADL Comments Patient seen this date for scooping motion for ice cream, simulated with ball of putty to slice into mound, scooping with supination movement with cues and demo from therapist.   Multiple repetitions completed.  Patient seen for operating scissors with right hand to cut straight lines, curvy lines, wavy and pointed edged  lines, able to complete with accuracy and good speed.      Fine Motor Coordination   Other Fine Motor Exercises Patient seen for manipulation of small dowel sticks from tabletop to pick up and place into grid, cues for turning from horizontal position to vertical position and using oppositional movements.  Patient removing and using translatory skills of the hand to move from fingertips to palm for storage and then back to fingertips with cues.      Neurological Re-education Exercises   Other Exercises 1 Patient seen for right hand strengthening tasks with gross grip with 2 containers of green resistive putty combined, lateral pinch, 2 point and 3 point pinches, bilateral hand use to pull apart for multiple reps.  Cues for technique.                  OT Education - 05/03/17 1126    Education provided Yes   Education Details resistive exercises with putty for right hand, scooping with right hand   Person(s) Educated Patient   Methods Explanation;Demonstration;Verbal cues   Comprehension Verbalized understanding;Returned demonstration;Verbal cues required             OT Long Term Goals - 05/03/17 1130      OT LONG TERM GOAL #1   Title Pt. will independently, and efficiently write 3 sentences with 100% legibility.   Baseline Increased time required. 90% legibility for only 2 words in large print.   Time 12   Period Weeks   Status On-going     OT LONG TERM GOAL #2   Title Pt. will independently type a paragraph efficiently.   Baseline Pt. is unable   Time 12   Period Weeks   Status On-going     OT LONG TERM GOAL #3   Title Pt. improve right grip strength by 5 pounds to be able to hold and use a wrench.   Baseline Pt. is unable   Time 12   Period Weeks   Status On-going     OT LONG TERM GOAL #4   Title Pt. will improve RUE strength, and hand function skills for independence with work related tasks.   Baseline Pt. is unable to apply toothpaste onto a toothbrush.    Time 12   Period Weeks   Status Revised     OT LONG TERM GOAL #5   Title Pt. will improve FMC by 10 sec. to be able to independently handle, and count change.   Baseline Pt. has difficulty   Time 12   Period Weeks   Status  On-going     OT LONG TERM GOAL #6   Title Pt. will increase RUE strength by 2mm grades to be able to complete ADLs/IADLs.   Baseline Pt. has difficulty   Time 12   Period Weeks   Status On-going     OT LONG TERM GOAL #7   Title Pt. will demonstrate independence with visual compensatory strategies for the right 100% of the time during ADL, and IADL tasks.   Baseline Limited on the right. Pt. occ. misses obstacle on the right.   Time 12   Period Weeks   Status On-going               Plan - 05/03/17 1127    Clinical Impression Statement Patient continues to perform exercises at home and reports he is trying to use his right hand as much as he can.  He really would like to get back to work soon and is hopeful the doctor will release him to go back in July.  He is going on a vacation with his family to the beach the next week and will continue to work on fine motor coordination, strength and functional use of right hand while he is gone.  He continues to demonstrate progress in all areas and is showing development with more complex hand skills such as cutting with scissors, handwriting and manipulation of smaller objects.  Will continue to work towards goals to increase safety and independence in daily tasks.    Rehab Potential Good   OT Frequency 2x / week   OT Duration 12 weeks   OT Treatment/Interventions Self-care/ADL training;Therapeutic exercise;Therapeutic exercises;Therapeutic activities;DME and/or AE instruction;Neuromuscular education;Patient/family education;Functional Mobility Training;Cognitive remediation/compensation;Visual/perceptual remediation/compensation;Energy conservation   Consulted and Agree with Plan of Care Patient      Patient will  benefit from skilled therapeutic intervention in order to improve the following deficits and impairments:  Decreased coordination, Decreased balance, Impaired UE functional use, Decreased cognition, Impaired vision/preception, Decreased strength, Decreased knowledge of use of DME, Decreased activity tolerance, Impaired tone, Impaired flexibility, Decreased range of motion, Decreased endurance  Visit Diagnosis: Muscle weakness (generalized)  Other lack of coordination  Hemiplegia and hemiparesis following cerebral infarction affecting right dominant side [facility](HCC)    Problem List Patient Active Problem List   Diagnosis Date Noted  . Hemiparesis affecting right side as late effect of cerebrovascular accident (CVA) (HCC) 03/16/2017  . Aphasia, late effect of cerebrovascular disease 03/16/2017  . Gait disturbance, post-stroke 03/16/2017  . Dysarthria as late effect of stroke 03/16/2017  . Carotid artery stenosis with cerebral infarction (HCC) 03/07/2017  . Language impairment   . ICAO (internal carotid artery occlusion), left 02/22/2017  . Acute respiratory failure (HCC) 02/22/2017  . Obesity 02/22/2017  . Expressive aphasia 02/22/2017  . Acute ischemic left MCA stroke (HCC) 02/22/2017  . Left hemiparesis (HCC)   . Status post total bilateral knee replacement   . Benign essential HTN   . Right-sided extracranial carotid artery stenosis   . Dysphagia, post-stroke   . Acute blood loss anemia   . Nocturia   . Newly diagnosed diabetes (HCC)   . CVA (cerebral vascular accident) (HCC) - L MCA s/p tPA and thrombectomy 02/16/2017  . Anxiety 04/30/2015  . Benign fibroma of prostate 04/30/2015  . Clinical depression 04/30/2015  . Acid reflux 04/30/2015  . HLD (hyperlipidemia) 04/30/2015  . BP (high blood pressure) 04/30/2015  . Arthritis, degenerative 04/30/2015  . 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 Arthritis, septic,  knee [facility]) 09/26/2012   Amy T Lovett, OTR/L,  CLT  Lovett,Amy 05/03/2017, 11:31 AM  [facility] [facility] MAIN [facility] SERVICES [address] [place], [place], [address] Phone: [phone]   Fax:  [phone]  Name: Erik [name] MRN: [id number] Date of Birth: 1955-12-05

## 2017-05-03 NOTE — Therapy (Signed)
[place] [name] MAIN [facility] SERVICES [address] [place], [place], [address] Phone: [phone]   Fax:  [phone]  Physical Therapy Treatment  Patient Details  Name: Erik [name] MRN: [id number] Date of Birth: October 09, 1956 Referring Provider: Cruzita Lederer  Encounter Date: 05/03/2017      PT End of Session - 05/03/17 1050    Visit Number 15   Number of Visits 27   Date for PT Re-Evaluation 06/14/17   PT Start Time 1005   PT Stop Time 1047   PT Time Calculation (min) 42 min   Equipment Utilized During Treatment Other (comment);Gait belt  S-min A prn   Activity Tolerance Patient tolerated treatment well   Behavior During Therapy [facility] for tasks assessed/performed      Past Medical History:  Diagnosis Date  . Anxiety   . Arthritis    hands  . BPH (benign prostatic hyperplasia)   . Depression   . 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 ED (erectile dysfunction)   . GERD (gastroesophageal reflux disease)   . H/O calcium pyrophosphate deposition disease (CPPD)   . HLD (hyperlipidemia)   . Hypertension   . Hypogonadism in male   . Lower urinary tract symptoms (LUTS)   . Nocturia   . Stroke [facility])    Language Impairment.  Left Hemiparesis- improved  . Testosterone overdose   . Vitamin D deficiency     Past Surgical History:  Procedure Laterality Date  . BACK SURGERY    . COLON SURGERY     Colon Resection  . COLONOSCOPY    . IR ANGIO INTRA EXTRACRAN SEL COM CAROTID INNOMINATE UNI L MOD SED  03/07/2017  . IR ANGIO INTRA EXTRACRAN SEL COM CAROTID INNOMINATE UNI R MOD SED  02/16/2017  . IR ANGIO VERTEBRAL SEL SUBCLAVIAN INNOMINATE UNI L MOD SED  03/07/2017  . IR ANGIO VERTEBRAL SEL VERTEBRAL UNI R MOD SED  03/07/2017  . IR INFUSION THROMBOL ARTERIAL INITIAL (MS)  03/07/2017  . IR INTRAVSC STENT CERV CAROTID W/EMB-PROT MOD SED INCL ANGIO  03/07/2017  . IR PERCUTANEOUS ART THROMBECTOMY/INFUSION INTRACRANIAL INC DIAG  ANGIO  02/16/2017  . IR PTA NON CORO-LOWER EXTREM  02/16/2017  . IR RADIOLOGIST EVAL & MGMT  04/04/2017  . IR US GUIDE VASC ACCESS LEFT  02/16/2017  . IR US GUIDE VASC ACCESS RIGHT  02/16/2017  . KNEE ARTHROSCOPY Bilateral   . RADIOLOGY WITH ANESTHESIA N/A 02/16/2017   Procedure: RADIOLOGY WITH ANESTHESIA;  Surgeon: Julieanne Cotton, MD;  Location: [facility] OR;  Service: Radiology;  Laterality: N/A;  . RADIOLOGY WITH ANESTHESIA N/A 03/07/2017   Procedure: CAROTID STENT;  Surgeon: Julieanne Cotton, MD;  Location: [facility] OR;  Service: Radiology;  Laterality: N/A;  . ROTATOR CUFF REPAIR Right     There were no vitals filed for this visit.      Subjective Assessment - 05/03/17 1049    Subjective Patient leaves for the beach today for a week. He has started weightlifting at home and is continuing his treadmill routine.    Pertinent History Pt. is a 61 y.o. male who had a CVA on the evening of April 12th, 2018. Pt had been to his grandson's ball game that day and watched a movie with his wife. He got in bed and his wife went to take a bath. When she came out pt was on the floor struggling to get back in bed. Pt had become weak while in bed,  tried to sit up and fell out of the bed. She could tell he was having a stroke, and called EMS. [facility] and CTA head/neck revealed hyperdense L-MCA sign likely indicating acute thrombus. He received tPA and he underwent cerebral angiogram with L-MCA thrombectomy, dense calcified plaque L-ICA precluded stent placement and noted to have 73% stenosis R-ICA due to advanced atherosclerotic changes. Patient with resultant facial weakness with mild dysarthria, aphasia, mild left gaze preference and right sided weakness affecting mobility and ability to carry out ADL tasks. He was in CIR for approximately 8 days per patient and then discharged home. He has been evaluated by outpatient speech and occupational therapy and is now here with wife today for PT  evaluation.    Patient Stated Goals Improve his strength and balance in order to improve his function at home and be able to return to work.    Currently in Pain? No/denies          [facility] PT Assessment - 05/03/17 0001      Berg Balance Test   Sit to Stand Able to stand without using hands and stabilize independently   Standing Unsupported Able to stand safely 2 minutes   Sitting with Back Unsupported but Feet Supported on Floor or Stool Able to sit safely and securely 2 minutes   Stand to Sit Sits safely with minimal use of hands   Transfers Able to transfer safely, minor use of hands   Standing Unsupported with Eyes Closed Able to stand 10 seconds safely   Standing Ubsupported with Feet Together Able to place feet together independently and stand 1 minute safely   From Standing, Reach Forward with Outstretched Arm Can reach confidently >25 cm (10")   From Standing Position, Pick up Object from Floor Able to pick up shoe safely and easily   From Standing Position, Turn to Look Behind Over each Shoulder Looks behind from both sides and weight shifts well   Turn 360 Degrees Able to turn 360 degrees safely in 4 seconds or less   Standing Unsupported, Alternately Place Feet on Step/Stool Able to complete 4 steps without aid or supervision   Standing Unsupported, One Foot in Front Able to place foot tandem independently and hold 30 seconds   Standing on One Leg Able to lift leg independently and hold > 10 seconds   Total Score 54     Dynamic Gait Index   Level Surface Normal   Change in Gait Speed Normal   Gait with Horizontal Head Turns Mild Impairment   Gait with Vertical Head Turns Normal   Gait and Pivot Turn Normal   Step Over Obstacle Mild Impairment   Step Around Obstacles Normal   Steps Mild Impairment   Total Score 21       BERG:54 DGI: 21 Strength MMT: df 4- RLE, everything else 4/5 5x STS 9 seconds 6 minute walk test : 111600ft  Neuro Re-ed Cross body arm and leg  raises 10x Airex pad in sandals: balance 60 seconds airex pad heel rasies x20, toe raises x 20; in sandles     TherEx rockerboard seated df 20x, pf 20x walking in grass sandals, difficulty raises R foot up and had increased shuffle compensating by excessive knee flexion when cued to lift foot.  Ankle pumps x 20    Pt. response to medical necessity:  Patient will continue to benefit from skilled physical therapy to improve strength, balance, and gait to improve safety during  functional mobility.          PT Long Term Goals - 05/03/17 1054      PT LONG TERM GOAL #1   Title Pt will be independent with HEP in order to improve strength and balance in order to decrease fall risk and improve function at home and work.    Baseline in progress   Time 8   Period Weeks   Status On-going     PT LONG TERM GOAL #2   Title Pt will improve BERG by at least 3 points in order to demonstrate clinically significant improvement in balance.   Baseline 6/28:54/56 ;  03/14/17: 49/56   Time 8   Period Weeks   Status Achieved     PT LONG TERM GOAL #3   Title Pt will improve DGI by at least 3 points in order to demonstrate clinically significant improvement in balance and decreased risk for falls    Baseline 6/28: 21/24; 03/14/17: 17/24   Time 8   Period Weeks   Status Achieved     PT LONG TERM GOAL #4   Title Pt will improve R hip abduction, adduction, and flexion strength by at least 1/2 MMT in order to improve gait    Baseline 6/28: R df4-, everything else 4 to 4+. 03/14/17: see flowsheet   Time 8   Status On-going     PT LONG TERM GOAL #5   Title Pt will improve DGI to 23/24 points in order to demonstrate improvement in balance and decreased risk for falls    Baseline 6/28: 21/24     Additional Long Term Goals   Additional Long Term Goals Yes     PT LONG TERM GOAL #6   Title Patient will ambulate >2000 ft in 6 minute walk test in order to be near age norm for gait.   Baseline 6/28: 1100  ft with stumbles   Time 6   Period Weeks   Status New               Plan - 05/03/17 1053    Clinical Impression Statement Patient demonstrates improved balance and gait pattern. Occasional foot slap still noted during ambulation and pt. Requires more than one trial for single limb balance in order to focus upon task. BERG=54, DGI: 21, 5x STS 9 seconds, 6 min walk test=1100 ft. Pt. Should be >2000 ft for his age group norm. Patient is progressing with home involvement and will be decreased to 1x/week. Patient will continue to benefit from skilled physical therapy to improve strength, balance, and gait to improve safety during functional mobility.   Rehab Potential Good   Clinical Impairments Affecting Rehab Potential Positive: age, motivation, social support; Negative: significant ICA stenosis   PT Frequency 1x / week   PT Duration 6 weeks   PT Treatment/Interventions ADLs/Self Care Home Management;Aquatic Therapy;Biofeedback;Cryotherapy;Electrical Stimulation;Iontophoresis 4mg /ml Dexamethasone;Moist Heat;Ultrasound;DME Instruction;Gait training;Stair training;Functional mobility training;Therapeutic activities;Therapeutic exercise;Balance training;Neuromuscular re-education;Patient/family education;Manual techniques;Passive range of motion;Energy conservation   PT Next Visit Plan new HEP   PT Home Exercise Plan Hip 3 ways with RTB, mini squats, RTB resisted R ankle DF, toe taps, semitandem/tandem balance; Bil concentric and eccentric R calf raises; R hamstring curls wiith GTB   Consulted and Agree with Plan of Care Patient      Patient will benefit from skilled therapeutic intervention in order to improve the following deficits and impairments:  Abnormal gait, Decreased balance, Decreased coordination, Decreased mobility, Decreased strength, Difficulty walking  Visit Diagnosis:  Muscle weakness (generalized)  Other lack of coordination  Unsteadiness on feet     Problem  List Patient Active Problem List   Diagnosis Date Noted  . Hemiparesis affecting right side as late effect of cerebrovascular accident (CVA) (HCC) 03/16/2017  . Aphasia, late effect of cerebrovascular disease 03/16/2017  . Gait disturbance, post-stroke 03/16/2017  . Dysarthria as late effect of stroke 03/16/2017  . Carotid artery stenosis with cerebral infarction (HCC) 03/07/2017  . Language impairment   . ICAO (internal carotid artery occlusion), left 02/22/2017  . Acute respiratory failure (HCC) 02/22/2017  . Obesity 02/22/2017  . Expressive aphasia 02/22/2017  . Acute ischemic left MCA stroke (HCC) 02/22/2017  . Left hemiparesis (HCC)   . Status post total bilateral knee replacement   . Benign essential HTN   . Right-sided extracranial carotid artery stenosis   . Dysphagia, post-stroke   . Acute blood loss anemia   . Nocturia   . Newly diagnosed diabetes (HCC)   . CVA (cerebral vascular accident) (HCC) - L MCA s/p tPA and thrombectomy 02/16/2017  . Anxiety 04/30/2015  . Benign fibroma of prostate 04/30/2015  . Clinical depression 04/30/2015  . Acid reflux 04/30/2015  . HLD (hyperlipidemia) 04/30/2015  . BP (high blood pressure) 04/30/2015  . Arthritis, degenerative 04/30/2015  . 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 Arthritis, septic, knee (HCC) 09/26/2012   Precious Bard, PT, DPT   05/03/2017, 10:59 AM  [place] [facility] MAIN [facility] SERVICES [address] [place], [place], [address] Phone: [phone]   Fax:  [phone]  Name: Erik [name] MRN: [id number] Date of Birth: 1956/01/28

## 2017-05-03 NOTE — Therapy (Signed)
[facility] MAIN [facility] SERVICES [address] [place], [place], [address] Phone: [phone]   Fax:  [phone]  Speech Language Pathology Treatment  Patient Details  Name: Erik [name] MRN: [id number] Date of Birth: [birth year] Referring Provider: Charlton Amor   Encounter Date: 05/03/2017      End of Session - 05/03/17 1234    Visit Number 14   Number of Visits 20   Date for SLP Re-Evaluation 06/12/17   SLP Start Time 1055   SLP Stop Time  1155   SLP Time Calculation (min) 60 min   Activity Tolerance Patient tolerated treatment well      Past Medical History:  Diagnosis Date  . Anxiety   . Arthritis    hands  . BPH (benign prostatic hyperplasia)   . Depression   . 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 ED (erectile dysfunction)   . GERD (gastroesophageal reflux disease)   . H/O calcium pyrophosphate deposition disease (CPPD)   . HLD (hyperlipidemia)   . Hypertension   . Hypogonadism in male   . Lower urinary tract symptoms (LUTS)   . Nocturia   . Stroke [facility])    Language Impairment.  Left Hemiparesis- improved  . Testosterone overdose   . Vitamin D deficiency     Past Surgical History:  Procedure Laterality Date  . BACK SURGERY    . COLON SURGERY     Colon Resection  . COLONOSCOPY    . IR ANGIO INTRA EXTRACRAN SEL COM CAROTID INNOMINATE UNI L MOD SED  03/07/2017  . IR ANGIO INTRA EXTRACRAN SEL COM CAROTID INNOMINATE UNI R MOD SED  02/16/2017  . IR ANGIO VERTEBRAL SEL SUBCLAVIAN INNOMINATE UNI L MOD SED  03/07/2017  . IR ANGIO VERTEBRAL SEL VERTEBRAL UNI R MOD SED  03/07/2017  . IR INFUSION THROMBOL ARTERIAL INITIAL (MS)  03/07/2017  . IR INTRAVSC STENT CERV CAROTID W/EMB-PROT MOD SED INCL ANGIO  03/07/2017  . IR PERCUTANEOUS ART THROMBECTOMY/INFUSION INTRACRANIAL INC DIAG ANGIO  02/16/2017  . IR PTA NON CORO-LOWER EXTREM  02/16/2017  . IR RADIOLOGIST EVAL & MGMT  04/04/2017  . IR US GUIDE VASC  ACCESS LEFT  02/16/2017  . IR US GUIDE VASC ACCESS RIGHT  02/16/2017  . KNEE ARTHROSCOPY Bilateral   . RADIOLOGY WITH ANESTHESIA N/A 02/16/2017   Procedure: RADIOLOGY WITH ANESTHESIA;  Surgeon: Julieanne Cotton, MD;  Location: [facility] OR;  Service: Radiology;  Laterality: N/A;  . RADIOLOGY WITH ANESTHESIA N/A 03/07/2017   Procedure: CAROTID STENT;  Surgeon: Julieanne Cotton, MD;  Location: [facility] OR;  Service: Radiology;  Laterality: N/A;  . ROTATOR CUFF REPAIR Right     There were no vitals filed for this visit.      Subjective Assessment - 05/03/17 1232    Subjective Patient was excited about needing less therapy in PT and OT.    Currently in Pain? No/denies               ADULT SLP TREATMENT - 05/03/17 0001      General Information   Behavior/Cognition Alert;Cooperative;Pleasant mood   HPI 61 year old man with left MCA CVA 02/15/2017.  Patient received inpatient rehab services, including SLP, 02/22/2017-03/02/2017.     Treatment Provided   Treatment provided Cognitive-Linquistic     Pain Assessment   Pain Assessment No/denies pain     Cognitive-Linquistic Treatment   Treatment focused on Aphasia;Dysarthria   Skilled Treatment Patient described pictures  that the clinician could not see. After looking at the picture, clinician would decide if patient had left out any major details. Patient provided sufficient details for 75% of the pictures. Patient also thought of an animal and could use three phrases to describe the animal. The patient provided enough detail for the clinician to guess the animal with 80% accuracy. Patient performed a synonym and sentence generation activity with 70% grammatical accuracy. Patient generated categories for sets of three words with over 90% accuracy. Patient performed these tasks with 80% intelligibility.     Assessment / Recommendations / Plan   Plan Continue with current plan of care     Progression Toward Goals   Progression toward goals Progressing  toward goals          SLP Education - 05/03/17 1233    Education provided Yes   Education Details Slower conversation rate   Person(s) Educated Patient   Methods Explanation   Comprehension Verbalized understanding            SLP Long Term Goals - 04/19/17 1231      SLP LONG TERM GOAL #1   Title Pt will improve speech intelligibility in response to moderately complex cognitive linguistic tasks by controlling rate of speech, over-articulation, and increased loudness to achieve 80% intelligibility.   Status On-going     SLP LONG TERM GOAL #2   Title Patient will read aloud sentences and multi-syllabic words, maintaining phonemic accuracy, with 80% accuracy.     Time 12   Period Weeks   Status On-going     SLP LONG TERM GOAL #3   Title Patient will demonstrate reading comprehension for paragraphs with 80% accuracy.    Time 12   Period Weeks   Status On-going     SLP LONG TERM GOAL #4   Title Patient will complete high level word finding tasks with 80% accuracy.   Time 12   Period Weeks   Status On-going          Plan - 05/03/17 1234    Clinical Impression Statement Patient tends to speak faster at the start of the session and then catches himself and monitors himself better as the session goes on. Patient needs to attend to his speech with more focus in order to self-monitor more successfully. Patient still needs cueing to slow down mostly during conversations, but when he is paying close attention to his speech he is able to self-monitor and self-correct.   Speech Therapy Frequency 2x / week   Duration Other (comment)   Treatment/Interventions Patient/family education;Compensatory strategies;SLP instruction and feedback;Functional tasks;Cueing hierarchy   Potential to Achieve Goals Good   Potential Considerations Ability to learn/carryover information;Co-morbidities;Cooperation/participation level;Medical prognosis;Previous level of function;Severity of  impairments;Family/community support   SLP Home Exercise Plan Daily conversation with family members   Consulted and Agree with Plan of Care Patient      Patient will benefit from skilled therapeutic intervention in order to improve the following deficits and impairments:   Dysarthria  Aphasia    Problem List Patient Active Problem List   Diagnosis Date Noted  . Hemiparesis affecting right side as late effect of cerebrovascular accident (CVA) (HCC) 03/16/2017  . Aphasia, late effect of cerebrovascular disease 03/16/2017  . Gait disturbance, post-stroke 03/16/2017  . Dysarthria as late effect of stroke 03/16/2017  . Carotid artery stenosis with cerebral infarction (HCC) 03/07/2017  . Language impairment   . ICAO (internal carotid artery occlusion), left 02/22/2017  . Acute respiratory  failure (HCC) 02/22/2017  . Obesity 02/22/2017  . Expressive aphasia 02/22/2017  . Acute ischemic left MCA stroke (HCC) 02/22/2017  . Left hemiparesis (HCC)   . Status post total bilateral knee replacement   . Benign essential HTN   . Right-sided extracranial carotid artery stenosis   . Dysphagia, post-stroke   . Acute blood loss anemia   . Nocturia   . Newly diagnosed diabetes (HCC)   . CVA (cerebral vascular accident) (HCC) - L MCA s/p tPA and thrombectomy 02/16/2017  . Anxiety 04/30/2015  . Benign fibroma of prostate 04/30/2015  . Clinical depression 04/30/2015  . Acid reflux 04/30/2015  . HLD (hyperlipidemia) 04/30/2015  . BP (high blood pressure) 04/30/2015  . Arthritis, degenerative 04/30/2015  . 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 Arthritis, septic, knee (HCC) 09/26/2012    Clydene PughGrace Ayleah Hofmeister 05/03/2017, 12:34 PM  [facility] [address] [place], [place], [address] Phone: [phone]   Fax:  [phone]   Name: Keturah BarreJames W Dula MRN: [id number] Date of Birth: January 02, 1956

## 2017-05-08 ENCOUNTER — Encounter: Payer: Managed Care, Other (non HMO) | Admitting: Speech Pathology

## 2017-05-08 ENCOUNTER — Encounter: Payer: Managed Care, Other (non HMO) | Admitting: Occupational Therapy

## 2017-05-08 ENCOUNTER — Ambulatory Visit: Payer: Managed Care, Other (non HMO)

## 2017-05-10 ENCOUNTER — Encounter: Payer: Managed Care, Other (non HMO) | Admitting: Speech Pathology

## 2017-05-10 ENCOUNTER — Encounter: Payer: Managed Care, Other (non HMO) | Admitting: Occupational Therapy

## 2017-05-14 ENCOUNTER — Ambulatory Visit: Payer: Managed Care, Other (non HMO) | Admitting: Speech Pathology

## 2017-05-14 ENCOUNTER — Encounter: Payer: Self-pay | Admitting: Speech Pathology

## 2017-05-14 ENCOUNTER — Ambulatory Visit: Payer: Managed Care, Other (non HMO)

## 2017-05-14 ENCOUNTER — Encounter: Payer: Self-pay | Admitting: Occupational Therapy

## 2017-05-14 ENCOUNTER — Ambulatory Visit: Payer: Managed Care, Other (non HMO) | Attending: Physician Assistant | Admitting: Occupational Therapy

## 2017-05-14 DIAGNOSIS — R278 Other lack of coordination: Secondary | ICD-10-CM | POA: Insufficient documentation

## 2017-05-14 DIAGNOSIS — M6281 Muscle weakness (generalized): Secondary | ICD-10-CM

## 2017-05-14 DIAGNOSIS — R471 Dysarthria and anarthria: Secondary | ICD-10-CM

## 2017-05-14 DIAGNOSIS — R4701 Aphasia: Secondary | ICD-10-CM

## 2017-05-14 NOTE — Therapy (Signed)
[facility] MAIN [facility] SERVICES [address] [place], [place], [address] Phone: [phone]   Fax:  [phone]  Occupational Therapy Treatment  Patient Details  Name: Erik [name] MRN: [id number] Date of Birth: 1955/11/13 Referring Provider: Dr. Hildred Alamin  Encounter Date: 05/14/2017      OT End of Session - 05/14/17 0932    Visit Number 14   Number of Visits 24   Date for OT Re-Evaluation 06/04/17   OT Start Time 0915   OT Stop Time 1000   OT Time Calculation (min) 45 min   Activity Tolerance Patient tolerated treatment well      Past Medical History:  Diagnosis Date  . Anxiety   . Arthritis    hands  . BPH (benign prostatic hyperplasia)   . Depression   . 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 ED (erectile dysfunction)   . GERD (gastroesophageal reflux disease)   . H/O calcium pyrophosphate deposition disease (CPPD)   . HLD (hyperlipidemia)   . Hypertension   . Hypogonadism in male   . Lower urinary tract symptoms (LUTS)   . Nocturia   . Stroke [facility])    Language Impairment.  Left Hemiparesis- improved  . Testosterone overdose   . Vitamin D deficiency     Past Surgical History:  Procedure Laterality Date  . BACK SURGERY    . COLON SURGERY     Colon Resection  . COLONOSCOPY    . IR ANGIO INTRA EXTRACRAN SEL COM CAROTID INNOMINATE UNI L MOD SED  03/07/2017  . IR ANGIO INTRA EXTRACRAN SEL COM CAROTID INNOMINATE UNI R MOD SED  02/16/2017  . IR ANGIO VERTEBRAL SEL SUBCLAVIAN INNOMINATE UNI L MOD SED  03/07/2017  . IR ANGIO VERTEBRAL SEL VERTEBRAL UNI R MOD SED  03/07/2017  . IR INFUSION THROMBOL ARTERIAL INITIAL (MS)  03/07/2017  . IR INTRAVSC STENT CERV CAROTID W/EMB-PROT MOD SED INCL ANGIO  03/07/2017  . IR PERCUTANEOUS ART THROMBECTOMY/INFUSION INTRACRANIAL INC DIAG ANGIO  02/16/2017  . IR PTA NON CORO-LOWER EXTREM  02/16/2017  . IR RADIOLOGIST EVAL & MGMT  04/04/2017  . IR US GUIDE VASC ACCESS LEFT   02/16/2017  . IR US GUIDE VASC ACCESS RIGHT  02/16/2017  . KNEE ARTHROSCOPY Bilateral   . RADIOLOGY WITH ANESTHESIA N/A 02/16/2017   Procedure: RADIOLOGY WITH ANESTHESIA;  Surgeon: Julieanne Cotton, MD;  Location: [facility] OR;  Service: Radiology;  Laterality: N/A;  . RADIOLOGY WITH ANESTHESIA N/A 03/07/2017   Procedure: CAROTID STENT;  Surgeon: Julieanne Cotton, MD;  Location: [facility] OR;  Service: Radiology;  Laterality: N/A;  . ROTATOR CUFF REPAIR Right     There were no vitals filed for this visit.      Subjective Assessment - 05/14/17 0930    Subjective  Pt. has just returned from vacationing in his camper in [place].   Patient is accompained by: Family member   Pertinent History Pt. is a 61 y.o. male who had a CVA on the evening of April 12th, 2018. Pt. had been to his grandson's ball game, and was preparing for bed when he felling getting into bed. According to his wife, he struggled to get up. She could tell he was having a stroke, and called EMS. Pt. was transported to [facility],, was administered TPA,. Pt. underwent a cerebral angiogram with Rt ICA stent assisted angioplasty. Pt. received Therapy services in [facility]. at [facility].   Patient Stated Goals To  regain the use of his right hand   Currently in Pain? No/denies        OT TREATMENT    Therapeutic Exercise:  Pt. worked on the [employer] for 8 min. with constant monitoring of the BUEs. Pt. worked on changing, and alternating forward reverse position every 2 min. rest breaks were required.   Selfcare:  Pt. worked on Careers information officer, and speed in small spaces filling out a calendar.                          OT Education - 05/14/17 0932    Education provided Yes   Education Details RUE functioning, writing.   Person(s) Educated Patient   Methods Explanation   Comprehension Verbalized understanding             OT Long Term Goals - 05/03/17 1130      OT LONG TERM GOAL #1   Title Pt. will  independently, and efficiently write 3 sentences with 100% legibility.   Baseline Increased time required. 90% legibility for only 2 words in large print.   Time 12   Period Weeks   Status On-going     OT LONG TERM GOAL #2   Title Pt. will independently type a paragraph efficiently.   Baseline Pt. is unable   Time 12   Period Weeks   Status On-going     OT LONG TERM GOAL #3   Title Pt. improve right grip strength by 5 pounds to be able to hold and use a wrench.   Baseline Pt. is unable   Time 12   Period Weeks   Status On-going     OT LONG TERM GOAL #4   Title Pt. will improve RUE strength, and hand function skills for independence with work related tasks.   Baseline Pt. is unable to apply toothpaste onto a toothbrush.   Time 12   Period Weeks   Status Revised     OT LONG TERM GOAL #5   Title Pt. will improve FMC by 10 sec. to be able to independently handle, and count change.   Baseline Pt. has difficulty   Time 12   Period Weeks   Status On-going     OT LONG TERM GOAL #6   Title Pt. will increase RUE strength by 2mm grades to be able to complete ADLs/IADLs.   Baseline Pt. has difficulty   Time 12   Period Weeks   Status On-going     OT LONG TERM GOAL #7   Title Pt. will demonstrate independence with visual compensatory strategies for the right 100% of the time during ADL, and IADL tasks.   Baseline Limited on the right. Pt. occ. misses obstacle on the right.   Time 12   Period Weeks   Status On-going               Plan - 05/14/17 0933    Clinical Impression Statement Pt. spent a week in his camper at [facility]. Pt. reports he used his right UE when packing up, however he had to take numerous rest breaks.  Pt. reports driving home. Pt. continues to make progress overall with RUE strength, and hand function. Pt. continues to work on improving right hand [facility] coordination skills, and using his right hand for writing, writing with less right hand stiffness,  legibility, and speed.   Occupational performance deficits (Please refer to evaluation for details): ADL's   Rehab Potential  Good   OT Frequency 2x / week   OT Duration 12 weeks   OT Treatment/Interventions Self-care/ADL training;Therapeutic exercise;Therapeutic exercises;Therapeutic activities;DME and/or AE instruction;Neuromuscular education;Patient/family education;Functional Mobility Training;Cognitive remediation/compensation;Visual/perceptual remediation/compensation;Energy conservation   Consulted and Agree with Plan of Care Patient      Patient will benefit from skilled therapeutic intervention in order to improve the following deficits and impairments:  Decreased coordination, Decreased balance, Impaired UE functional use, Decreased cognition, Impaired vision/preception, Decreased strength, Decreased knowledge of use of DME, Decreased activity tolerance, Impaired tone, Impaired flexibility, Decreased range of motion, Decreased endurance  Visit Diagnosis: Muscle weakness (generalized)  Other lack of coordination    Problem List Patient Active Problem List   Diagnosis Date Noted  . Hemiparesis affecting right side as late effect of cerebrovascular accident (CVA) (HCC) 03/16/2017  . Aphasia, late effect of cerebrovascular disease 03/16/2017  . Gait disturbance, post-stroke 03/16/2017  . Dysarthria as late effect of stroke 03/16/2017  . Carotid artery stenosis with cerebral infarction (HCC) 03/07/2017  . Language impairment   . ICAO (internal carotid artery occlusion), left 02/22/2017  . Acute respiratory failure (HCC) 02/22/2017  . Obesity 02/22/2017  . Expressive aphasia 02/22/2017  . Acute ischemic left MCA stroke (HCC) 02/22/2017  . Left hemiparesis (HCC)   . Status post total bilateral knee replacement   . Benign essential HTN   . Right-sided extracranial carotid artery stenosis   . Dysphagia, post-stroke   . Acute blood loss anemia   . Nocturia   . Newly diagnosed  diabetes (HCC)   . CVA (cerebral vascular accident) (HCC) - L MCA s/p tPA and thrombectomy 02/16/2017  . Anxiety 04/30/2015  . Benign fibroma of prostate 04/30/2015  . Clinical depression 04/30/2015  . Acid reflux 04/30/2015  . HLD (hyperlipidemia) 04/30/2015  . BP (high blood pressure) 04/30/2015  . Arthritis, degenerative 04/30/2015  . 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 Arthritis, septic, knee (HCC) 09/26/2012    Olegario MessierElaine Deamonte Sayegh, MS, OTR/L 05/14/2017, 9:45 AM  [facility] [address] [place], [place], [address] Phone: [phone]   Fax:  [phone]  Name: Keturah BarreJames W Avellino MRN: [id number] Date of Birth: 26-Dec-1955

## 2017-05-14 NOTE — Therapy (Signed)
[place] [facility] MAIN [facility] SERVICES [address] [place], [place], [address] Phone: [phone]   Fax:  [phone]  Speech Language Pathology Treatment  Patient Details  Name: Erik [name] MRN: [id number] Date of Birth: 1956/08/19 Referring Provider: Charlton Amor   Encounter Date: 05/14/2017      End of Session - 05/14/17 1122    Visit Number 15   Number of Visits 20   Date for SLP Re-Evaluation 06/12/17   SLP Start Time 1000   SLP Stop Time  1100   SLP Time Calculation (min) 60 min   Activity Tolerance Patient tolerated treatment well      Past Medical History:  Diagnosis Date  . Anxiety   . Arthritis    hands  . BPH (benign prostatic hyperplasia)   . Depression   . 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 ED (erectile dysfunction)   . GERD (gastroesophageal reflux disease)   . H/O calcium pyrophosphate deposition disease (CPPD)   . HLD (hyperlipidemia)   . Hypertension   . Hypogonadism in male   . Lower urinary tract symptoms (LUTS)   . Nocturia   . Stroke [facility])    Language Impairment.  Left Hemiparesis- improved  . Testosterone overdose   . Vitamin D deficiency     Past Surgical History:  Procedure Laterality Date  . BACK SURGERY    . COLON SURGERY     Colon Resection  . COLONOSCOPY    . IR ANGIO INTRA EXTRACRAN SEL COM CAROTID INNOMINATE UNI L MOD SED  03/07/2017  . IR ANGIO INTRA EXTRACRAN SEL COM CAROTID INNOMINATE UNI R MOD SED  02/16/2017  . IR ANGIO VERTEBRAL SEL SUBCLAVIAN INNOMINATE UNI L MOD SED  03/07/2017  . IR ANGIO VERTEBRAL SEL VERTEBRAL UNI R MOD SED  03/07/2017  . IR INFUSION THROMBOL ARTERIAL INITIAL (MS)  03/07/2017  . IR INTRAVSC STENT CERV CAROTID W/EMB-PROT MOD SED INCL ANGIO  03/07/2017  . IR PERCUTANEOUS ART THROMBECTOMY/INFUSION INTRACRANIAL INC DIAG ANGIO  02/16/2017  . IR PTA NON CORO-LOWER EXTREM  02/16/2017  . IR RADIOLOGIST EVAL & MGMT  04/04/2017  . IR US GUIDE VASC  ACCESS LEFT  02/16/2017  . IR US GUIDE VASC ACCESS RIGHT  02/16/2017  . KNEE ARTHROSCOPY Bilateral   . RADIOLOGY WITH ANESTHESIA N/A 02/16/2017   Procedure: RADIOLOGY WITH ANESTHESIA;  Surgeon: Julieanne Cotton, MD;  Location: [facility] OR;  Service: Radiology;  Laterality: N/A;  . RADIOLOGY WITH ANESTHESIA N/A 03/07/2017   Procedure: CAROTID STENT;  Surgeon: Julieanne Cotton, MD;  Location: [facility] OR;  Service: Radiology;  Laterality: N/A;  . ROTATOR CUFF REPAIR Right     There were no vitals filed for this visit.      Subjective Assessment - 05/14/17 1121    Subjective  Patient was very tired today.   Currently in Pain? No/denies               ADULT SLP TREATMENT - 05/14/17 0001      General Information   Behavior/Cognition Alert;Cooperative;Pleasant mood   HPI 61 year old man with left MCA CVA 02/15/2017.  Patient received inpatient rehab services, including SLP, 02/22/2017-03/02/2017.     Treatment Provided   Treatment provided Cognitive-Linquistic     Pain Assessment   Pain Assessment No/denies pain     Cognitive-Linquistic Treatment   Treatment focused on Aphasia;Dysarthria   Skilled Treatment Patient spent 5 minutes telling the clinician all about  the trip he took. Patient was 80% intelligible in this conversation. Patient performed a task where he thought of objects and used three phrases to describe them. The patient provided enough detail for the clinician to guess the object with 100% accuracy. Patient performed a functional synonym and sentence generation activity with 60% grammatical accuracy. Patient performed functional problem solving tasks with 80% accuracy. Patient's reading comprehension across tasks was 85% accurate. Patient performed these tasks with 80% intelligibility. Clinician provided cues for slower speech rate, clear articulation, and descriptive details throughout the session.     Assessment / Recommendations / Plan   Plan Continue with current plan of care      Progression Toward Goals   Progression toward goals Progressing toward goals          SLP Education - 05/14/17 1121    Education provided Yes   Education Details Providing descriptive details in conversation   Person(s) Educated Patient   Methods Explanation   Comprehension Verbalized understanding            SLP Long Term Goals - 04/19/17 1231      SLP LONG TERM GOAL #1   Title Pt will improve speech intelligibility in response to moderately complex cognitive linguistic tasks by controlling rate of speech, over-articulation, and increased loudness to achieve 80% intelligibility.   Status On-going     SLP LONG TERM GOAL #2   Title Patient will read aloud sentences and multi-syllabic words, maintaining phonemic accuracy, with 80% accuracy.     Time 12   Period Weeks   Status On-going     SLP LONG TERM GOAL #3   Title Patient will demonstrate reading comprehension for paragraphs with 80% accuracy.    Time 12   Period Weeks   Status On-going     SLP LONG TERM GOAL #4   Title Patient will complete high level word finding tasks with 80% accuracy.   Time 12   Period Weeks   Status On-going          Plan - 05/14/17 1122    Clinical Impression Statement Patient needs to attend to his speech with more focus in order to self-monitor more successfully. Patient showed fatigue in later activities in the session. Clinician will continue to cue the patient to slow down his speech rate and use clear articulation.   Speech Therapy Frequency 2x / week   Duration Other (comment)   Treatment/Interventions Patient/family education;Compensatory strategies;SLP instruction and feedback;Functional tasks;Cueing hierarchy   Potential to Achieve Goals Good   Potential Considerations Ability to learn/carryover information;Co-morbidities;Cooperation/participation level;Medical prognosis;Previous level of function;Severity of impairments;Family/community support   SLP Home Exercise Plan  Daily conversation with family members   Consulted and Agree with Plan of Care Patient      Patient will benefit from skilled therapeutic intervention in order to improve the following deficits and impairments:   Aphasia  Dysarthria    Problem List Patient Active Problem List   Diagnosis Date Noted  . Hemiparesis affecting right side as late effect of cerebrovascular accident (CVA) (HCC) 03/16/2017  . Aphasia, late effect of cerebrovascular disease 03/16/2017  . Gait disturbance, post-stroke 03/16/2017  . Dysarthria as late effect of stroke 03/16/2017  . Carotid artery stenosis with cerebral infarction (HCC) 03/07/2017  . Language impairment   . ICAO (internal carotid artery occlusion), left 02/22/2017  . Acute respiratory failure (HCC) 02/22/2017  . Obesity 02/22/2017  . Expressive aphasia 02/22/2017  . Acute ischemic left MCA stroke (HCC) 02/22/2017  .  Left hemiparesis (HCC)   . Status post total bilateral knee replacement   . Benign essential HTN   . Right-sided extracranial carotid artery stenosis   . Dysphagia, post-stroke   . Acute blood loss anemia   . Nocturia   . Newly diagnosed diabetes (HCC)   . CVA (cerebral vascular accident) (HCC) - L MCA s/p tPA and thrombectomy 02/16/2017  . Anxiety 04/30/2015  . Benign fibroma of prostate 04/30/2015  . Clinical depression 04/30/2015  . Acid reflux 04/30/2015  . HLD (hyperlipidemia) 04/30/2015  . BP (high blood pressure) 04/30/2015  . Arthritis, degenerative 04/30/2015  . 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 Arthritis, septic, knee (HCC) 09/26/2012    Clydene Pugh 05/14/2017, 11:23 AM  [place] [facility] MAIN [facility] SERVICES [address] [place], [place], [address] Phone: [phone]   Fax:  [phone]   Name: NGHIA MCENTEE MRN: [id number] Date of Birth: 02/12/1956

## 2017-05-16 ENCOUNTER — Encounter: Payer: Self-pay | Admitting: Occupational Therapy

## 2017-05-16 ENCOUNTER — Ambulatory Visit: Payer: Managed Care, Other (non HMO) | Admitting: Speech Pathology

## 2017-05-16 ENCOUNTER — Ambulatory Visit: Payer: Managed Care, Other (non HMO) | Admitting: Occupational Therapy

## 2017-05-16 ENCOUNTER — Ambulatory Visit: Payer: Managed Care, Other (non HMO)

## 2017-05-16 ENCOUNTER — Encounter: Payer: Self-pay | Admitting: Speech Pathology

## 2017-05-16 DIAGNOSIS — M6281 Muscle weakness (generalized): Secondary | ICD-10-CM | POA: Diagnosis not present

## 2017-05-16 DIAGNOSIS — R4701 Aphasia: Secondary | ICD-10-CM

## 2017-05-16 DIAGNOSIS — R471 Dysarthria and anarthria: Secondary | ICD-10-CM

## 2017-05-16 DIAGNOSIS — R278 Other lack of coordination: Secondary | ICD-10-CM

## 2017-05-16 NOTE — Therapy (Signed)
[place] [facility] MAIN [facility] SERVICES [address] [place], [place], [address] Phone: [phone]   Fax:  [phone]  Physical Therapy Treatment  Patient Details  Name: Erik [name] MRN: [id number] Date of Birth: 1956-07-23 Referring Provider: Cruzita Lederer  Encounter Date: 05/16/2017      PT End of Session - 05/16/17 1257    Visit Number 16   Number of Visits 27   Date for PT Re-Evaluation 06/14/17   PT Start Time 1116   PT Stop Time 1201   PT Time Calculation (min) 45 min   Equipment Utilized During Treatment Other (comment);Gait belt  S-min A prn   Activity Tolerance Patient tolerated treatment well   Behavior During Therapy [facility] for tasks assessed/performed      Past Medical History:  Diagnosis Date  . Anxiety   . Arthritis    hands  . BPH (benign prostatic hyperplasia)   . Depression   . 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 ED (erectile dysfunction)   . GERD (gastroesophageal reflux disease)   . H/O calcium pyrophosphate deposition disease (CPPD)   . HLD (hyperlipidemia)   . Hypertension   . Hypogonadism in male   . Lower urinary tract symptoms (LUTS)   . Nocturia   . Stroke [facility])    Language Impairment.  Left Hemiparesis- improved  . Testosterone overdose   . Vitamin D deficiency     Past Surgical History:  Procedure Laterality Date  . BACK SURGERY    . COLON SURGERY     Colon Resection  . COLONOSCOPY    . IR ANGIO INTRA EXTRACRAN SEL COM CAROTID INNOMINATE UNI L MOD SED  03/07/2017  . IR ANGIO INTRA EXTRACRAN SEL COM CAROTID INNOMINATE UNI R MOD SED  02/16/2017  . IR ANGIO VERTEBRAL SEL SUBCLAVIAN INNOMINATE UNI L MOD SED  03/07/2017  . IR ANGIO VERTEBRAL SEL VERTEBRAL UNI R MOD SED  03/07/2017  . IR INFUSION THROMBOL ARTERIAL INITIAL (MS)  03/07/2017  . IR INTRAVSC STENT CERV CAROTID W/EMB-PROT MOD SED INCL ANGIO  03/07/2017  . IR PERCUTANEOUS ART THROMBECTOMY/INFUSION INTRACRANIAL INC DIAG  ANGIO  02/16/2017  . IR PTA NON CORO-LOWER EXTREM  02/16/2017  . IR RADIOLOGIST EVAL & MGMT  04/04/2017  . IR US GUIDE VASC ACCESS LEFT  02/16/2017  . IR US GUIDE VASC ACCESS RIGHT  02/16/2017  . KNEE ARTHROSCOPY Bilateral   . RADIOLOGY WITH ANESTHESIA N/A 02/16/2017   Procedure: RADIOLOGY WITH ANESTHESIA;  Surgeon: Julieanne Cotton, MD;  Location: [facility] OR;  Service: Radiology;  Laterality: N/A;  . RADIOLOGY WITH ANESTHESIA N/A 03/07/2017   Procedure: CAROTID STENT;  Surgeon: Julieanne Cotton, MD;  Location: [facility] OR;  Service: Radiology;  Laterality: N/A;  . ROTATOR CUFF REPAIR Right     There were no vitals filed for this visit.      Subjective Assessment - 05/16/17 1253    Subjective Patient returned from vacation with good functional mobility. R shoulder is now biggest problem due to limited functional mobility.    Pertinent History Pt. is a 61 y.o. male who had a CVA on the evening of April 12th, 2018. Pt had been to his grandson's ball game that day and watched a movie with his wife. He got in bed and his wife went to take a bath. When she came out pt was on the floor struggling to get back in bed. Pt had become weak while in bed, tried to  sit up and fell out of the bed. She could tell he was having a stroke, and called EMS. Pt was transported to [facility] and CTA head/neck revealed hyperdense L-MCA sign likely indicating acute thrombus. He received tPA and he underwent cerebral angiogram with L-MCA thrombectomy, dense calcified plaque L-ICA precluded stent placement and noted to have 73% stenosis R-ICA due to advanced atherosclerotic changes. Patient with resultant facial weakness with mild dysarthria, aphasia, mild left gaze preference and right sided weakness affecting mobility and ability to carry out ADL tasks. He was in CIR for approximately 8 days per patient and then discharged home. He has been evaluated by outpatient speech and occupational therapy and is now here with wife today for PT  evaluation.    Patient Stated Goals Improve his strength and balance in order to improve his function at home and be able to return to work.    Currently in Pain? No/denies      MMT 5/5 throughout bilaterally 6 min walk test: 1510 DGI: 24/24  Stepping up (4 blocks high) step with right and down with left 10x Superset: Stepping up and down from two consecutive 4 block high steps with up with right down with left, combined with 15x heel raises against wall with UE reaches  Stair negotiation no UE assistance, reciprocal gait pattern ascending and descending.  Shoulder PROM with prolonged holds multiple trials for 20 second holds, Flexion, abduction, ER. Mobilizations grade I-III R shoulder inferior,AP, and PA limited.          PT Long Term Goals - 05/16/17 1215      PT LONG TERM GOAL #1   Title Pt will be independent with HEP in order to improve strength and balance in order to decrease fall risk and improve function at home and work.    Baseline given and demonstrated understanding   Time 8   Period Weeks   Status Achieved     PT LONG TERM GOAL #2   Title Pt will improve BERG by at least 3 points in order to demonstrate clinically significant improvement in balance.   Baseline 6/28:54/56 ;  03/14/17: 49/56   Time 8   Period Weeks   Status Achieved     PT LONG TERM GOAL #3   Title Pt will improve DGI by at least 3 points in order to demonstrate clinically significant improvement in balance and decreased risk for falls    Baseline 6/28: 21/24; 03/14/17: 17/24   Time 8   Period Weeks   Status Achieved     PT LONG TERM GOAL #4   Title Pt will improve R hip abduction, adduction, and flexion strength by at least 1/2 MMT in order to improve gait    Baseline 6/28: R df4-, everything else 4 to 4+. 03/14/17: see flowsheet 7/11: 5/5   Time 8   Status Achieved     PT LONG TERM GOAL #5   Title Pt will improve DGI to 23/24 points in order to demonstrate improvement in balance and  decreased risk for falls    Baseline 6/28: 21/24 7/11 24/24   Time 6   Period Weeks   Status Achieved     PT LONG TERM GOAL #6   Title Patient will ambulate >2000 ft in 6 minute walk test in order to be near age norm for gait.   Baseline 6/28: 1100 ft with stumbles 7/11: 1573ft   Time 6   Period Weeks   Status On-going  Plan - 05/16/17 1257    Clinical Impression Statement Patient ambulatory capacity improving with functional mobility and velocity. Functional capacity for ambulation has improved with improved 6 min walk test : 153110ft, as well as gait mechanics DGI 24/24. MMT 5/5. Patient limited in R shoulder mobility and would benefit from referral to PT for shoulder.    Rehab Potential Good   Clinical Impairments Affecting Rehab Potential Positive: age, motivation, social support; Negative: significant ICA stenosis   PT Frequency 1x / week   PT Duration 6 weeks   PT Treatment/Interventions ADLs/Self Care Home Management;Aquatic Therapy;Biofeedback;Cryotherapy;Electrical Stimulation;Iontophoresis 4mg /ml Dexamethasone;Moist Heat;Ultrasound;DME Instruction;Gait training;Stair training;Functional mobility training;Therapeutic activities;Therapeutic exercise;Balance training;Neuromuscular re-education;Patient/family education;Manual techniques;Passive range of motion;Energy conservation   PT Next Visit Plan new HEP   PT Home Exercise Plan Hip 3 ways with RTB, mini squats, RTB resisted R ankle DF, toe taps, semitandem/tandem balance; Bil concentric and eccentric R calf raises; R hamstring curls wiith GTB   Consulted and Agree with Plan of Care Patient      Patient will benefit from skilled therapeutic intervention in order to improve the following deficits and impairments:  Abnormal gait, Decreased balance, Decreased coordination, Decreased mobility, Decreased strength, Difficulty walking  Visit Diagnosis: Muscle weakness (generalized)  Other lack of  coordination     Problem List Patient Active Problem List   Diagnosis Date Noted  . Hemiparesis affecting right side as late effect of cerebrovascular accident (CVA) (HCC) 03/16/2017  . Aphasia, late effect of cerebrovascular disease 03/16/2017  . Gait disturbance, post-stroke 03/16/2017  . Dysarthria as late effect of stroke 03/16/2017  . Carotid artery stenosis with cerebral infarction (HCC) 03/07/2017  . Language impairment   . ICAO (internal carotid artery occlusion), left 02/22/2017  . Acute respiratory failure (HCC) 02/22/2017  . Obesity 02/22/2017  . Expressive aphasia 02/22/2017  . Acute ischemic left MCA stroke (HCC) 02/22/2017  . Left hemiparesis (HCC)   . Status post total bilateral knee replacement   . Benign essential HTN   . Right-sided extracranial carotid artery stenosis   . Dysphagia, post-stroke   . Acute blood loss anemia   . Nocturia   . Newly diagnosed diabetes (HCC)   . CVA (cerebral vascular accident) (HCC) - L MCA s/p tPA and thrombectomy 02/16/2017  . Anxiety 04/30/2015  . Benign fibroma of prostate 04/30/2015  . Clinical depression 04/30/2015  . Acid reflux 04/30/2015  . HLD (hyperlipidemia) 04/30/2015  . BP (high blood pressure) 04/30/2015  . Arthritis, degenerative 04/30/2015  . 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 Arthritis, septic, knee (HCC) 09/26/2012   Precious BardMarina Rosaleigh Brazzel, PT, DPT   Precious BardMarina Mayvis Agudelo 05/16/2017, 12:59 PM  [place] [facility] MAIN [facility] [address] [place], [place], [address] Phone: [phone]   Fax:  [phone]  Name: Keturah BarreJames W Zahm MRN: [id number] Date of Birth: Jan 31, 1956

## 2017-05-16 NOTE — Therapy (Signed)
[place] [facility] MAIN [facility] SERVICES [address] [place], [place], [address] Phone: [phone]   Fax:  [phone]  Speech Language Pathology Treatment  Patient Details  Name: Erik [name] MRN: [id number] Date of Birth: Jan 23, 1956 Referring Provider: Charlton Amor   Encounter Date: 05/16/2017      End of Session - 05/16/17 1436    Visit Number 16   Number of Visits 20   Date for SLP Re-Evaluation 06/12/17   SLP Start Time 1000   SLP Stop Time  1055   SLP Time Calculation (min) 55 min   Activity Tolerance Patient tolerated treatment well      Past Medical History:  Diagnosis Date  . Anxiety   . Arthritis    hands  . BPH (benign prostatic hyperplasia)   . Depression   . 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 ED (erectile dysfunction)   . GERD (gastroesophageal reflux disease)   . H/O calcium pyrophosphate deposition disease (CPPD)   . HLD (hyperlipidemia)   . Hypertension   . Hypogonadism in male   . Lower urinary tract symptoms (LUTS)   . Nocturia   . Stroke [facility])    Language Impairment.  Left Hemiparesis- improved  . Testosterone overdose   . Vitamin D deficiency     Past Surgical History:  Procedure Laterality Date  . BACK SURGERY    . COLON SURGERY     Colon Resection  . COLONOSCOPY    . IR ANGIO INTRA EXTRACRAN SEL COM CAROTID INNOMINATE UNI L MOD SED  03/07/2017  . IR ANGIO INTRA EXTRACRAN SEL COM CAROTID INNOMINATE UNI R MOD SED  02/16/2017  . IR ANGIO VERTEBRAL SEL SUBCLAVIAN INNOMINATE UNI L MOD SED  03/07/2017  . IR ANGIO VERTEBRAL SEL VERTEBRAL UNI R MOD SED  03/07/2017  . IR INFUSION THROMBOL ARTERIAL INITIAL (MS)  03/07/2017  . IR INTRAVSC STENT CERV CAROTID W/EMB-PROT MOD SED INCL ANGIO  03/07/2017  . IR PERCUTANEOUS ART THROMBECTOMY/INFUSION INTRACRANIAL INC DIAG ANGIO  02/16/2017  . IR PTA NON CORO-LOWER EXTREM  02/16/2017  . IR RADIOLOGIST EVAL & MGMT  04/04/2017  . IR US GUIDE VASC  ACCESS LEFT  02/16/2017  . IR US GUIDE VASC ACCESS RIGHT  02/16/2017  . KNEE ARTHROSCOPY Bilateral   . RADIOLOGY WITH ANESTHESIA N/A 02/16/2017   Procedure: RADIOLOGY WITH ANESTHESIA;  Surgeon: Julieanne Cotton, MD;  Location: [facility] OR;  Service: Radiology;  Laterality: N/A;  . RADIOLOGY WITH ANESTHESIA N/A 03/07/2017   Procedure: CAROTID STENT;  Surgeon: Julieanne Cotton, MD;  Location: [facility] OR;  Service: Radiology;  Laterality: N/A;  . ROTATOR CUFF REPAIR Right     There were no vitals filed for this visit.      Subjective Assessment - 05/16/17 1435    Subjective Patient mentioned that he is trying to go back to work in the next month and believes that he has good speech strategies to do so.   Currently in Pain? No/denies               ADULT SLP TREATMENT - 05/16/17 0001      General Information   Behavior/Cognition Alert;Cooperative;Pleasant mood   HPI 61 year old man with left MCA CVA 02/15/2017.  Patient received inpatient rehab services, including SLP, 02/22/2017-03/02/2017.     Treatment Provided   Treatment provided Cognitive-Linquistic     Pain Assessment   Pain Assessment No/denies pain     Cognitive-Linquistic  Treatment   Treatment focused on Aphasia;Dysarthria   Skilled Treatment Patient performed a functional synonym and sentence generation activity with 85% grammatical accuracy. Patient performed functional problem solving tasks with 80% accuracy. Patient performed a functional category task with 80% accuracy. Patient's reading comprehension across tasks was 80% accurate. Patient performed these tasks with 85% intelligibility. Clinician provided cues for slower speech rate, clear articulation, and descriptive details throughout the session.     Assessment / Recommendations / Plan   Plan Continue with current plan of care     Progression Toward Goals   Progression toward goals Progressing toward goals          SLP Education - 05/16/17 1435    Education provided  Yes   Education Details Speech rate in conversation   Person(s) Educated Patient   Methods Explanation   Comprehension Verbalized understanding            SLP Long Term Goals - 04/19/17 1231      SLP LONG TERM GOAL #1   Title Pt will improve speech intelligibility in response to moderately complex cognitive linguistic tasks by controlling rate of speech, over-articulation, and increased loudness to achieve 80% intelligibility.   Status On-going     SLP LONG TERM GOAL #2   Title Patient will read aloud sentences and multi-syllabic words, maintaining phonemic accuracy, with 80% accuracy.     Time 12   Period Weeks   Status On-going     SLP LONG TERM GOAL #3   Title Patient will demonstrate reading comprehension for paragraphs with 80% accuracy.    Time 12   Period Weeks   Status On-going     SLP LONG TERM GOAL #4   Title Patient will complete high level word finding tasks with 80% accuracy.   Time 12   Period Weeks   Status On-going          Plan - 05/16/17 1436    Clinical Impression Statement Patient is improving across functional tasks and his intelligibility across all tasks and in conversation is more consistently around 80%. Patient understands that he is more intelligible when he slows down his speech rate. Clinician will continue to provide cues for slow rate and clear articulation during conversations and cognitively stimulating tasks.   Speech Therapy Frequency 2x / week   Duration Other (comment)   Treatment/Interventions Patient/family education;Compensatory strategies;SLP instruction and feedback;Functional tasks;Cueing hierarchy   Potential to Achieve Goals Good   Potential Considerations Ability to learn/carryover information;Co-morbidities;Cooperation/participation level;Medical prognosis;Previous level of function;Severity of impairments;Family/community support   SLP Home Exercise Plan Daily conversation with family members   Consulted and Agree with  Plan of Care Patient      Patient will benefit from skilled therapeutic intervention in order to improve the following deficits and impairments:   Dysarthria  Aphasia    Problem List Patient Active Problem List   Diagnosis Date Noted  . Hemiparesis affecting right side as late effect of cerebrovascular accident (CVA) (HCC) 03/16/2017  . Aphasia, late effect of cerebrovascular disease 03/16/2017  . Gait disturbance, post-stroke 03/16/2017  . Dysarthria as late effect of stroke 03/16/2017  . Carotid artery stenosis with cerebral infarction (HCC) 03/07/2017  . Language impairment   . ICAO (internal carotid artery occlusion), left 02/22/2017  . Acute respiratory failure (HCC) 02/22/2017  . Obesity 02/22/2017  . Expressive aphasia 02/22/2017  . Acute ischemic left MCA stroke (HCC) 02/22/2017  . Left hemiparesis (HCC)   . Status post total bilateral knee  replacement   . Benign essential HTN   . Right-sided extracranial carotid artery stenosis   . Dysphagia, post-stroke   . Acute blood loss anemia   . Nocturia   . Newly diagnosed diabetes (HCC)   . CVA (cerebral vascular accident) (HCC) - L MCA s/p tPA and thrombectomy 02/16/2017  . Anxiety 04/30/2015  . Benign fibroma of prostate 04/30/2015  . Clinical depression 04/30/2015  . Acid reflux 04/30/2015  . HLD (hyperlipidemia) 04/30/2015  . BP (high blood pressure) 04/30/2015  . Arthritis, degenerative 04/30/2015  . 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 Arthritis, septic, knee (HCC) 09/26/2012    Clydene Pugh 05/16/2017, 2:37 PM  [place] [facility] MAIN [facility] SERVICES [address] [place], [place], [address] Phone: [phone]   Fax:  [phone]   Name: Erik [name] MRN: [id number] Date of Birth: [birth year]

## 2017-05-16 NOTE — Therapy (Signed)
[place] [facility] MAIN [facility] SERVICES [address] [place], [place], [address] Phone: [phone]   Fax:  [phone]  Occupational Therapy Treatment  Erik [name] Details  Name: Erik [name] MRN: [id number] Date of Birth: [birth year] Referring Provider: Dr. Hildred Alamin  Encounter Date: 05/16/2017      OT End of Session - 05/16/17 0947    Visit Number 15   Number of Visits 24   Date for OT Re-Evaluation 06/04/17   OT Start Time 0915   OT Stop Time 1000   OT Time Calculation (min) 45 min   Activity Tolerance Erik [name] tolerated treatment well   Behavior During Therapy [facility] for tasks assessed/performed      Past Medical History:  Diagnosis Date  . Anxiety   . Arthritis    hands  . BPH (benign prostatic hyperplasia)   . Depression   . 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 ED (erectile dysfunction)   . GERD (gastroesophageal reflux disease)   . H/O calcium pyrophosphate deposition disease (CPPD)   . HLD (hyperlipidemia)   . Hypertension   . Hypogonadism in male   . Lower urinary tract symptoms (LUTS)   . Nocturia   . Stroke [facility])    Language Impairment.  Left Hemiparesis- improved  . Testosterone overdose   . Vitamin D deficiency     Past Surgical History:  Procedure Laterality Date  . BACK SURGERY    . COLON SURGERY     Colon Resection  . COLONOSCOPY    . IR ANGIO INTRA EXTRACRAN SEL COM CAROTID INNOMINATE UNI L MOD SED  03/07/2017  . IR ANGIO INTRA EXTRACRAN SEL COM CAROTID INNOMINATE UNI R MOD SED  02/16/2017  . IR ANGIO VERTEBRAL SEL SUBCLAVIAN INNOMINATE UNI L MOD SED  03/07/2017  . IR ANGIO VERTEBRAL SEL VERTEBRAL UNI R MOD SED  03/07/2017  . IR INFUSION THROMBOL ARTERIAL INITIAL (MS)  03/07/2017  . IR INTRAVSC STENT CERV CAROTID W/EMB-PROT MOD SED INCL ANGIO  03/07/2017  . IR PERCUTANEOUS ART THROMBECTOMY/INFUSION INTRACRANIAL INC DIAG ANGIO  02/16/2017  . IR PTA NON CORO-LOWER EXTREM  02/16/2017  . IR RADIOLOGIST  EVAL & MGMT  04/04/2017  . IR US GUIDE VASC ACCESS LEFT  02/16/2017  . IR US GUIDE VASC ACCESS RIGHT  02/16/2017  . KNEE ARTHROSCOPY Bilateral   . RADIOLOGY WITH ANESTHESIA N/A 02/16/2017   Procedure: RADIOLOGY WITH ANESTHESIA;  Surgeon: Julieanne Cotton, MD;  Location: [facility] OR;  Service: Radiology;  Laterality: N/A;  . RADIOLOGY WITH ANESTHESIA N/A 03/07/2017   Procedure: CAROTID STENT;  Surgeon: Julieanne Cotton, MD;  Location: [facility] OR;  Service: Radiology;  Laterality: N/A;  . ROTATOR CUFF REPAIR Right     There were no vitals filed for this visit.      Subjective Assessment - 05/16/17 0944    Subjective  Pt reports he has pink theraputty at home.   Erik [name] is accompained by: Erik [name]   Pertinent History Pt. is a 61 y.o. male who had a CVA on the evening of April 12th, 2018. Pt. had been to his grandson's ball game, and was preparing for bed when he felling getting into bed. According to his wife, he struggled to get up. She could tell he was having a stroke, and called EMS. Pt. was transported to [facility],, was administered TPA,. Pt. underwent a cerebral angiogram with Rt ICA stent assisted angioplasty. Pt. received Therapy services in CIR. at  [facility].  Erik [name] Stated Goals To regain the use of his right hand   Currently in Pain? No/denies        OT TREATMENT    Neuro muscular re-education:  Therapeutic Exercise:  Pt. Worked on the [employer] for 8 min. With constant monitoring of the BUEs. Pt. Worked on changing, and alternating forward reverse position every 2 min. Rest breaks were required. Pt. Was upgraded to green theraputty. Pt. Was provided with a visual handout. Pt. Performed hand strengthening with green theraputty. Pt. required cues for proper technique. Pt. worked on gross grip loop, lateral pinch, 3pt. pinch, gross digit extension, digit extension table spread, digit abduction loop, single digit extension loop, thumb opposition, and lumbical ex,  Pt. required verbal  and tactile cues for proper technique.  Selfcare:  Pt. worked on Diplomatic Services operational officer speed, and legibility. Pt. worked on prewriting exercises, and copying recipes. Pt. Attempted several different widths of pens.                            OT Education - 05/16/17 0945    Education provided Yes   Education Details [facility] skills. writing   Person(s) Educated Erik [name]   Methods Explanation   Comprehension Verbalized understanding             OT Long Term Goals - 05/03/17 1130      OT LONG TERM GOAL #1   Title Pt. will independently, and efficiently write 3 sentences with 100% legibility.   Baseline Increased time required. 90% legibility for only 2 words in large print.   Time 12   Period Weeks   Status On-going     OT LONG TERM GOAL #2   Title Pt. will independently type a paragraph efficiently.   Baseline Pt. is unable   Time 12   Period Weeks   Status On-going     OT LONG TERM GOAL #3   Title Pt. improve right grip strength by 5 pounds to be able to hold and use a wrench.   Baseline Pt. is unable   Time 12   Period Weeks   Status On-going     OT LONG TERM GOAL #4   Title Pt. will improve RUE strength, and hand function skills for independence with work related tasks.   Baseline Pt. is unable to apply toothpaste onto a toothbrush.   Time 12   Period Weeks   Status Revised     OT LONG TERM GOAL #5   Title Pt. will improve FMC by 10 sec. to be able to independently handle, and count change.   Baseline Pt. has difficulty   Time 12   Period Weeks   Status On-going     OT LONG TERM GOAL #6   Title Pt. will increase RUE strength by 2mm grades to be able to complete ADLs/IADLs.   Baseline Pt. has difficulty   Time 12   Period Weeks   Status On-going     OT LONG TERM GOAL #7   Title Pt. will demonstrate independence with visual compensatory strategies for the right 100% of the time during ADL, and IADL tasks.   Baseline Limited on the right. Pt. occ.  misses obstacle on the right.   Time 12   Period Weeks   Status On-going               Plan - 05/16/17 0947    Clinical Impression Statement Pt. reports he has no pain,  but he continues to have stiffness. Pt. was upgraded to green theraputty. Pt. was able to demonstrate proper techniques for threaputty. Pt. continues to work on writing speed, and legibility.    Occupational performance deficits (Please refer to evaluation for details): ADL's   OT Frequency 2x / week   OT Duration 12 weeks   OT Treatment/Interventions Self-care/ADL training;Therapeutic exercise;Therapeutic exercises;Therapeutic activities;DME and/or AE instruction;Neuromuscular education;Erik [name]/Erik education;Functional Mobility Training;Cognitive remediation/compensation;Visual/perceptual remediation/compensation;Energy conservation   Consulted and Agree with Plan of Care Erik [name]      Erik [name] will benefit from skilled therapeutic intervention in order to improve the following deficits and impairments:  Decreased coordination, Decreased balance, Impaired UE functional use, Decreased cognition, Impaired vision/preception, Decreased strength, Decreased knowledge of use of DME, Decreased activity tolerance, Impaired tone, Impaired flexibility, Decreased range of motion, Decreased endurance  Visit Diagnosis: Muscle weakness (generalized)  Other lack of coordination    Problem List Erik [name] Active Problem List   Diagnosis Date Noted  . Hemiparesis affecting right side as late effect of cerebrovascular accident (CVA) (HCC) 03/16/2017  . Aphasia, late effect of cerebrovascular disease 03/16/2017  . Gait disturbance, post-stroke 03/16/2017  . Dysarthria as late effect of stroke 03/16/2017  . Carotid artery stenosis with cerebral infarction (HCC) 03/07/2017  . Language impairment   . ICAO (internal carotid artery occlusion), left 02/22/2017  . Acute respiratory failure (HCC) 02/22/2017  . Obesity 02/22/2017  .  Expressive aphasia 02/22/2017  . Acute ischemic left MCA stroke (HCC) 02/22/2017  . Left hemiparesis (HCC)   . Status post total bilateral knee replacement   . Benign essential HTN   . Right-sided extracranial carotid artery stenosis   . Dysphagia, post-stroke   . Acute blood loss anemia   . Nocturia   . Newly diagnosed diabetes (HCC)   . CVA (cerebral vascular accident) (HCC) - L MCA s/p tPA and thrombectomy 02/16/2017  . Anxiety 04/30/2015  . Benign fibroma of prostate 04/30/2015  . Clinical depression 04/30/2015  . Acid reflux 04/30/2015  . HLD (hyperlipidemia) 04/30/2015  . BP (high blood pressure) 04/30/2015  . Arthritis, degenerative 04/30/2015  . 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 Arthritis, septic, knee (HCC) 09/26/2012    Olegario MessierElaine Ellington Cornia, MS, OTR/L 05/16/2017, 9:55 AM  [place] [facility] [address] [place], [place], [address] Phone: [phone]   Fax:  [phone]  Name: Keturah BarreJames W Scheuring MRN: [id number] Date of Birth: [birth year]

## 2017-05-18 ENCOUNTER — Telehealth (HOSPITAL_COMMUNITY): Payer: Self-pay

## 2017-05-18 ENCOUNTER — Other Ambulatory Visit (HOSPITAL_COMMUNITY): Payer: Self-pay | Admitting: Interventional Radiology

## 2017-05-18 NOTE — Telephone Encounter (Signed)
Called to schedule f/u us carotid, left message for pt to return call. AW 

## 2017-05-21 ENCOUNTER — Ambulatory Visit: Payer: Managed Care, Other (non HMO) | Admitting: Speech Pathology

## 2017-05-21 ENCOUNTER — Ambulatory Visit: Payer: Managed Care, Other (non HMO) | Admitting: Occupational Therapy

## 2017-05-21 ENCOUNTER — Ambulatory Visit: Payer: Managed Care, Other (non HMO)

## 2017-05-21 ENCOUNTER — Other Ambulatory Visit (HOSPITAL_COMMUNITY): Payer: Self-pay | Admitting: Interventional Radiology

## 2017-05-21 ENCOUNTER — Encounter: Payer: Self-pay | Admitting: Occupational Therapy

## 2017-05-21 ENCOUNTER — Encounter: Payer: Self-pay | Admitting: Speech Pathology

## 2017-05-21 DIAGNOSIS — R471 Dysarthria and anarthria: Secondary | ICD-10-CM

## 2017-05-21 DIAGNOSIS — M6281 Muscle weakness (generalized): Secondary | ICD-10-CM

## 2017-05-21 DIAGNOSIS — I771 Stricture of artery: Secondary | ICD-10-CM

## 2017-05-21 DIAGNOSIS — R4701 Aphasia: Secondary | ICD-10-CM

## 2017-05-21 DIAGNOSIS — R278 Other lack of coordination: Secondary | ICD-10-CM

## 2017-05-21 NOTE — Therapy (Signed)
[facility]IN [facility] [address] [place], [place], [address] Phone: [phone]   Fax:  [phone]  Speech Language Pathology Treatment  Patient Details  Name: Erik [name] MRN: [id number] Date of Birth: July 24, 1956 Referring Provider: Charlton AmorANGIULLI, DANIEL J   Encounter Date: 05/21/2017      End of Session - 05/21/17 1119    Visit Number 17   Number of Visits 20   Date for SLP Re-Evaluation 06/12/17   SLP Start Time 0910   SLP Stop Time  1000   SLP Time Calculation (min) 50 min   Activity Tolerance Patient tolerated treatment well      Past Medical History:  Diagnosis Date  . Anxiety   . Arthritis    hands  . BPH (benign prostatic hyperplasia)   . Depression   . 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 ED (erectile dysfunction)   . GERD (gastroesophageal reflux disease)   . H/O calcium pyrophosphate deposition disease (CPPD)   . HLD (hyperlipidemia)   . Hypertension   . Hypogonadism in male   . Lower urinary tract symptoms (LUTS)   . Nocturia   . Stroke [facility](HCC)    Language Impairment.  Left Hemiparesis- improved  . Testosterone overdose   . Vitamin D deficiency     Past Surgical History:  Procedure Laterality Date  . BACK SURGERY    . COLON SURGERY     Colon Resection  . COLONOSCOPY    . IR ANGIO INTRA EXTRACRAN SEL COM CAROTID INNOMINATE UNI L MOD SED  03/07/2017  . IR ANGIO INTRA EXTRACRAN SEL COM CAROTID INNOMINATE UNI R MOD SED  02/16/2017  . IR ANGIO VERTEBRAL SEL SUBCLAVIAN INNOMINATE UNI L MOD SED  03/07/2017  . IR ANGIO VERTEBRAL SEL VERTEBRAL UNI R MOD SED  03/07/2017  . IR INFUSION THROMBOL ARTERIAL INITIAL (MS)  03/07/2017  . IR INTRAVSC STENT CERV CAROTID W/EMB-PROT MOD SED INCL ANGIO  03/07/2017  . IR PERCUTANEOUS ART THROMBECTOMY/INFUSION INTRACRANIAL INC DIAG ANGIO  02/16/2017  . IR PTA NON CORO-LOWER EXTREM  02/16/2017  . IR RADIOLOGIST EVAL & MGMT  04/04/2017  . IR US GUIDE VASC  ACCESS LEFT  02/16/2017  . IR US GUIDE VASC ACCESS RIGHT  02/16/2017  . KNEE ARTHROSCOPY Bilateral   . RADIOLOGY WITH ANESTHESIA N/A 02/16/2017   Procedure: RADIOLOGY WITH ANESTHESIA;  Surgeon: Julieanne CottonSanjeev Deveshwar, MD;  Location: [facility] OR;  Service: Radiology;  Laterality: N/A;  . RADIOLOGY WITH ANESTHESIA N/A 03/07/2017   Procedure: CAROTID STENT;  Surgeon: Julieanne CottonSanjeev Deveshwar, MD;  Location: [facility] OR;  Service: Radiology;  Laterality: N/A;  . ROTATOR CUFF REPAIR Right     There were no vitals filed for this visit.      Subjective Assessment - 05/21/17 1117    Subjective Patient is recording himself and listening to the recordings to evaluate his speech.   Currently in Pain? No/denies               ADULT SLP TREATMENT - 05/21/17 0001      General Information   Behavior/Cognition Alert;Cooperative;Pleasant mood   HPI 61 year old man with left MCA CVA 02/15/2017.  Patient received inpatient rehab services, including SLP, 02/22/2017-03/02/2017.     Treatment Provided   Treatment provided Cognitive-Linquistic     Pain Assessment   Pain Assessment No/denies pain     Cognitive-Linquistic Treatment   Treatment focused on Aphasia;Dysarthria   Skilled Treatment Patient completed  a functional exercise that required him to read aloud, make inferences about the content, and answer a question with 87% accuracy. Patient performed an activity where he sorted words into categories and identified the word that did not fit with the rest with 88% accuracy. The patient's reading content accuracy was 70% and his intelligibility was 90% across both tasks. In conversation, the patient's intelligibility was 85%.     Assessment / Recommendations / Plan   Plan Continue with current plan of care     Progression Toward Goals   Progression toward goals Progressing toward goals          SLP Education - 05/21/17 1117    Education provided Yes   Education Details Slow speech rate in conversation   Person(s)  Educated Patient   Methods Explanation   Comprehension Verbalized understanding            SLP Long Term Goals - 04/19/17 1231      SLP LONG TERM GOAL #1   Title Pt will improve speech intelligibility in response to moderately complex cognitive linguistic tasks by controlling rate of speech, over-articulation, and increased loudness to achieve 80% intelligibility.   Status On-going     SLP LONG TERM GOAL #2   Title Patient will read aloud sentences and multi-syllabic words, maintaining phonemic accuracy, with 80% accuracy.     Time 12   Period Weeks   Status On-going     SLP LONG TERM GOAL #3   Title Patient will demonstrate reading comprehension for paragraphs with 80% accuracy.    Time 12   Period Weeks   Status On-going     SLP LONG TERM GOAL #4   Title Patient will complete high level word finding tasks with 80% accuracy.   Time 12   Period Weeks   Status On-going          Plan - 05/21/17 1120    Clinical Impression Statement Patient is improving in his reading skills and is catching himself more frequently when he makes word errors. Patient understands that he is more intelligible when he slows down his speech rate, but notes that he has a harder time slowing down when he is tired or when he is multitasking. Clinician will incorporate multitasking and conversations into the next session.   Speech Therapy Frequency 2x / week   Duration Other (comment)   Treatment/Interventions Patient/family education;Compensatory strategies;SLP instruction and feedback;Functional tasks;Cueing hierarchy   Potential to Achieve Goals Good   Potential Considerations Ability to learn/carryover information;Co-morbidities;Cooperation/participation level;Medical prognosis;Previous level of function;Severity of impairments;Family/community support   SLP Home Exercise Plan Daily conversation with family members   Consulted and Agree with Plan of Care Patient      Patient will benefit from  skilled therapeutic intervention in order to improve the following deficits and impairments:   Dysarthria  Aphasia    Problem List Patient Active Problem List   Diagnosis Date Noted  . Hemiparesis affecting right side as late effect of cerebrovascular accident (CVA) (HCC) 03/16/2017  . Aphasia, late effect of cerebrovascular disease 03/16/2017  . Gait disturbance, post-stroke 03/16/2017  . Dysarthria as late effect of stroke 03/16/2017  . Carotid artery stenosis with cerebral infarction (HCC) 03/07/2017  . Language impairment   . ICAO (internal carotid artery occlusion), left 02/22/2017  . Acute respiratory failure (HCC) 02/22/2017  . Obesity 02/22/2017  . Expressive aphasia 02/22/2017  . Acute ischemic left MCA stroke (HCC) 02/22/2017  . Left hemiparesis (HCC)   .  Status post total bilateral knee replacement   . Benign essential HTN   . Right-sided extracranial carotid artery stenosis   . Dysphagia, post-stroke   . Acute blood loss anemia   . Nocturia   . Newly diagnosed diabetes (HCC)   . CVA (cerebral vascular accident) (HCC) - L MCA s/p tPA and thrombectomy 02/16/2017  . Anxiety 04/30/2015  . Benign fibroma of prostate 04/30/2015  . Clinical depression 04/30/2015  . Acid reflux 04/30/2015  . HLD (hyperlipidemia) 04/30/2015  . BP (high blood pressure) 04/30/2015  . Arthritis, degenerative 04/30/2015  . 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 Arthritis, septic, knee (HCC) 09/26/2012    Clydene Pugh 05/21/2017, 11:21 AM  [facility] MAIN [facility] SERVICES [address] [place], [place], [address] Phone: [phone]   Fax:  [phone]   Name: Erik [name] MRN: [id number] Date of Birth: 1956-06-09

## 2017-05-21 NOTE — Therapy (Signed)
[place] MAIN [facility] SERVICES [address] [place], [place], [address] Phone: [phone]   Fax:  [phone]  Occupational Therapy Treatment  Patient Details  Name: Erik [name] MRN: [id number] Date of Birth: September 08, 1956 Referring Provider: Dr. Hildred Alamin  Encounter Date: 05/21/2017      OT End of Session - 05/21/17 1050    Visit Number 16   Number of Visits 24   Date for OT Re-Evaluation 06/04/17   OT Start Time 1015   OT Stop Time 1100   OT Time Calculation (min) 45 min   Activity Tolerance Patient tolerated treatment well   Behavior During Therapy [facility] for tasks assessed/performed      Past Medical History:  Diagnosis Date  . Anxiety   . Arthritis    hands  . BPH (benign prostatic hyperplasia)   . Depression   . 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 ED (erectile dysfunction)   . GERD (gastroesophageal reflux disease)   . H/O calcium pyrophosphate deposition disease (CPPD)   . HLD (hyperlipidemia)   . Hypertension   . Hypogonadism in male   . Lower urinary tract symptoms (LUTS)   . Nocturia   . Stroke [facility])    Language Impairment.  Left Hemiparesis- improved  . Testosterone overdose   . Vitamin D deficiency     Past Surgical History:  Procedure Laterality Date  . BACK SURGERY    . COLON SURGERY     Colon Resection  . COLONOSCOPY    . IR ANGIO INTRA EXTRACRAN SEL COM CAROTID INNOMINATE UNI L MOD SED  03/07/2017  . IR ANGIO INTRA EXTRACRAN SEL COM CAROTID INNOMINATE UNI R MOD SED  02/16/2017  . IR ANGIO VERTEBRAL SEL SUBCLAVIAN INNOMINATE UNI L MOD SED  03/07/2017  . IR ANGIO VERTEBRAL SEL VERTEBRAL UNI R MOD SED  03/07/2017  . IR INFUSION THROMBOL ARTERIAL INITIAL (MS)  03/07/2017  . IR INTRAVSC STENT CERV CAROTID W/EMB-PROT MOD SED INCL ANGIO  03/07/2017  . IR PERCUTANEOUS ART THROMBECTOMY/INFUSION INTRACRANIAL INC DIAG ANGIO  02/16/2017  . IR PTA NON CORO-LOWER EXTREM  02/16/2017  . IR RADIOLOGIST  EVAL & MGMT  04/04/2017  . IR US GUIDE VASC ACCESS LEFT  02/16/2017  . IR US GUIDE VASC ACCESS RIGHT  02/16/2017  . KNEE ARTHROSCOPY Bilateral   . RADIOLOGY WITH ANESTHESIA N/A 02/16/2017   Procedure: RADIOLOGY WITH ANESTHESIA;  Surgeon: Julieanne Cotton, MD;  Location: [facility] OR;  Service: Radiology;  Laterality: N/A;  . RADIOLOGY WITH ANESTHESIA N/A 03/07/2017   Procedure: CAROTID STENT;  Surgeon: Julieanne Cotton, MD;  Location: [facility] OR;  Service: Radiology;  Laterality: N/A;  . ROTATOR CUFF REPAIR Right     There were no vitals filed for this visit.      Subjective Assessment - 05/21/17 1039    Subjective  Pt. reports he feels like he is improving overall.   Patient is accompained by: Family member   Pertinent History Pt. is a 61 y.o. male who had a CVA on the evening of April 12th, 2018. Pt. had been to his grandson's ball game, and was preparing for bed when he felling getting into bed. According to his wife, he struggled to get up. She could tell he was having a stroke, and called EMS. Pt. was transported to [facility],, was administered TPA,. Pt. underwent a cerebral angiogram with Rt ICA stent assisted angioplasty. Pt. received Therapy services in CIR. at [name].  Currently in Pain? No/denies   Pain Score 0-No pain   Pain Location Shoulder   Pain Orientation Right   Pain Descriptors / Indicators Aching        OT TREATMENT    Therapeutic Exercise:  Pt. Worked on the [employer] for 8 min. with constant monitoring of the BUEs. Pt. worked on changing, and alternating forward reverse position every 2 min. Rest breaks were required. Pt. Worked on shoulder AROM/AAROM  stretches to decrease stiffness, and increase ROM.  Selfcare:  Pt. 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 Pt. worked on prewriting exercises to decrease hand stiffness. Pt. worked on Theme park managerchallenging writing in small spaces. Pt. Worked on writing tasks in preparation for home, and worked related  script.                            OT Education - 05/21/17 1043    Education provided Yes   Education Details prewriting exercises. shoulder stretches.   Person(s) Educated Patient   Methods Explanation   Comprehension Verbalized understanding             OT Long Term Goals - 05/03/17 1130      OT LONG TERM GOAL #1   Title Pt. will independently, and efficiently write 3 sentences with 100% legibility.   Baseline Increased time required. 90% legibility for only 2 words in large print.   Time 12   Period Weeks   Status On-going     OT LONG TERM GOAL #2   Title Pt. will independently type a paragraph efficiently.   Baseline Pt. is unable   Time 12   Period Weeks   Status On-going     OT LONG TERM GOAL #3   Title Pt. improve right grip strength by 5 pounds to be able to hold and use a wrench.   Baseline Pt. is unable   Time 12   Period Weeks   Status On-going     OT LONG TERM GOAL #4   Title Pt. will improve RUE strength, and hand function skills for independence with work related tasks.   Baseline Pt. is unable to apply toothpaste onto a toothbrush.   Time 12   Period Weeks   Status Revised     OT LONG TERM GOAL #5   Title Pt. will improve FMC by 10 sec. to be able to independently handle, and count change.   Baseline Pt. has difficulty   Time 12   Period Weeks   Status On-going     OT LONG TERM GOAL #6   Title Pt. will increase RUE strength by 2mm grades to be able to complete ADLs/IADLs.   Baseline Pt. has difficulty   Time 12   Period Weeks   Status On-going     OT LONG TERM GOAL #7   Title Pt. will demonstrate independence with visual compensatory strategies for the right 100% of the time during ADL, and IADL tasks.   Baseline Limited on the right. Pt. occ. misses obstacle on the right.   Time 12   Period Weeks   Status On-going               Plan - 05/21/17 1053    Clinical Impression Statement Pt. reports he would  like to return to work in August. Pt. reports he has two MD appointments this week. Pt. was able to demonstrate home exercises with visual demonstration. Pt. continues to work on improving  writing speed, and legibility. Pt. requires verbal cues and visual demonstration for prewriting exercises. Pt. writing was challenged in smaller spaces.   Occupational performance deficits (Please refer to evaluation for details): ADL's   Rehab Potential Good   OT Frequency 2x / week   OT Duration 12 weeks   OT Treatment/Interventions Self-care/ADL training;Therapeutic exercise;Therapeutic exercises;Therapeutic activities;DME and/or AE instruction;Neuromuscular education;Patient/family education;Functional Mobility Training;Cognitive remediation/compensation;Visual/perceptual remediation/compensation;Energy conservation   Consulted and Agree with Plan of Care Patient      Patient will benefit from skilled therapeutic intervention in order to improve the following deficits and impairments:  Decreased coordination, Decreased balance, Impaired UE functional use, Decreased cognition, Impaired vision/preception, Decreased strength, Decreased knowledge of use of DME, Decreased activity tolerance, Impaired tone, Impaired flexibility, Decreased range of motion, Decreased endurance  Visit Diagnosis: Muscle weakness (generalized)  Other lack of coordination    Problem List Patient Active Problem List   Diagnosis Date Noted  . Hemiparesis affecting right side as late effect of cerebrovascular accident (CVA) (HCC) 03/16/2017  . Aphasia, late effect of cerebrovascular disease 03/16/2017  . Gait disturbance, post-stroke 03/16/2017  . Dysarthria as late effect of stroke 03/16/2017  . Carotid artery stenosis with cerebral infarction (HCC) 03/07/2017  . Language impairment   . ICAO (internal carotid artery occlusion), left 02/22/2017  . Acute respiratory failure (HCC) 02/22/2017  . Obesity 02/22/2017  . Expressive  aphasia 02/22/2017  . Acute ischemic left MCA stroke (HCC) 02/22/2017  . Left hemiparesis (HCC)   . Status post total bilateral knee replacement   . Benign essential HTN   . Right-sided extracranial carotid artery stenosis   . Dysphagia, post-stroke   . Acute blood loss anemia   . Nocturia   . Newly diagnosed diabetes (HCC)   . CVA (cerebral vascular accident) (HCC) - L MCA s/p tPA and thrombectomy 02/16/2017  . Anxiety 04/30/2015  . Benign fibroma of prostate 04/30/2015  . Clinical depression 04/30/2015  . Acid reflux 04/30/2015  . HLD (hyperlipidemia) 04/30/2015  . BP (high blood pressure) 04/30/2015  . Arthritis, degenerative 04/30/2015  . 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 Arthritis, septic, knee (HCC) 09/26/2012    Olegario Messier, MS, OTR/L 05/21/2017, 11:27 AM  [place] [facility] MAIN [facility] SERVICES [address] [place], [place], [address] Phone: [phone]   Fax:  [phone]  Name: Erik [name] MRN: [id number] Date of Birth: [birth year]

## 2017-05-22 ENCOUNTER — Ambulatory Visit (INDEPENDENT_AMBULATORY_CARE_PROVIDER_SITE_OTHER): Payer: 59 | Admitting: Neurology

## 2017-05-22 ENCOUNTER — Encounter: Payer: Self-pay | Admitting: Neurology

## 2017-05-22 VITALS — BP 125/70 | HR 71 | Ht 70.0 in | Wt 213.4 lb

## 2017-05-22 DIAGNOSIS — I69320 Aphasia following cerebral infarction: Secondary | ICD-10-CM

## 2017-05-22 NOTE — Patient Instructions (Signed)
I had a long d/w patient and his wifeabout his recent stroke, carotid stenosis and stenting,risk for recurrent stroke/TIAs, personally independently reviewed imaging studies and stroke evaluation results and answered questions.Continue aspirin 81 mg daily and  Brillinta 90 mg twice daily  for secondary stroke prevention  X 6 months then stop brillinta and maintain strict control of hypertension with blood pressure goal below 130/90, diabetes with hemoglobin A1c goal below 6.5% and lipids with LDL cholesterol goal below 70 mg/dL. I also advised the patient to eat a healthy diet with plenty of whole grains, cereals, fruits and vegetables, exercise regularly and maintain ideal body weight. He may return to work on 06/18/17 for 8 hrs per day and increase as tolerated in 2 weeks to 10 hrs per day 4 days per week. Followup in the future with my nurse practitioner in 3 months or call earlier if necessary  Stroke Prevention Some medical conditions and behaviors are associated with an increased chance of having a stroke. You may prevent a stroke by making healthy choices and managing medical conditions. How can I reduce my risk of having a stroke?  Stay physically active. Get at least 30 minutes of activity on most or all days.  Do not smoke. It may also be helpful to avoid exposure to secondhand smoke.  Limit alcohol use. Moderate alcohol use is considered to be: ? No more than 2 drinks per day for men. ? No more than 1 drink per day for nonpregnant women.  Eat healthy foods. This involves: ? Eating 5 or more servings of fruits and vegetables a day. ? Making dietary changes that address high blood pressure (hypertension), high cholesterol, diabetes, or obesity.  Manage your cholesterol levels. ? Making food choices that are high in fiber and low in saturated fat, trans fat, and cholesterol may control cholesterol levels. ? Take any prescribed medicines to control cholesterol as directed by your health  care provider.  Manage your diabetes. ? Controlling your carbohydrate and sugar intake is recommended to manage diabetes. ? Take any prescribed medicines to control diabetes as directed by your health care provider.  Control your hypertension. ? Making food choices that are low in salt (sodium), saturated fat, trans fat, and cholesterol is recommended to manage hypertension. ? Ask your health care provider if you need treatment to lower your blood pressure. Take any prescribed medicines to control hypertension as directed by your health care provider. ? If you are 24-39 years of age, have your blood pressure checked every 3-5 years. If you are 52 years of age or older, have your blood pressure checked every year.  Maintain a healthy weight. ? Reducing calorie intake and making food choices that are low in sodium, saturated fat, trans fat, and cholesterol are recommended to manage weight.  Stop drug abuse.  Avoid taking birth control pills. ? Talk to your health care provider about the risks of taking birth control pills if you are over 60 years old, smoke, get migraines, or have ever had a blood clot.  Get evaluated for sleep disorders (sleep apnea). ? Talk to your health care provider about getting a sleep evaluation if you snore a lot or have excessive sleepiness.  Take medicines only as directed by your health care provider. ? For some people, aspirin or blood thinners (anticoagulants) are helpful in reducing the risk of forming abnormal blood clots that can lead to stroke. If you have the irregular heart rhythm of atrial fibrillation, you should be on a  blood thinner unless there is a good reason you cannot take them. ? Understand all your medicine instructions.  Make sure that other conditions (such as anemia or atherosclerosis) are addressed. Get help right away if:  You have sudden weakness or numbness of the face, arm, or leg, especially on one side of the body.  Your face or  eyelid droops to one side.  You have sudden confusion.  You have trouble speaking (aphasia) or understanding.  You have sudden trouble seeing in one or both eyes.  You have sudden trouble walking.  You have dizziness.  You have a loss of balance or coordination.  You have a sudden, severe headache with no known cause.  You have new chest pain or an irregular heartbeat. Any of these symptoms may represent a serious problem that is an emergency. Do not wait to see if the symptoms will go away. Get medical help at once. Call your local emergency services (911 in [place] Do not drive yourself to the hospital. This information is not intended to replace advice given to you by your health care provider. Make sure you discuss any questions you have with your health care provider. Document Released: 11/30/2004 Document Revised: 03/30/2016 Document Reviewed: 04/25/2013 Elsevier Interactive Patient Education  2017 [employer]

## 2017-05-22 NOTE — Progress Notes (Signed)
[facility] [name] [place]. [place] [address] [phone]       OFFICE FOLLOW-UP NOTE  Erik. Erik [name] Date of Birth:  08-Jun-1956 Medical Record Number:  [id number]   HPI: Erik [name] is a pleasant 61 year old Caucasian male who is seen today for first office follow-up visit following hospital admission for stroke in April 2018. He is accompanied by his wife. History is obtained from the patient, wife, review of electronic medical records and have personally reviewed imaging films and stroke workup.Erik Blalock Milleris a 61 y.o.malewith a history of hypertension who was last in his normal state of health around 11:15PM 02/16/2017. He had an episode earlier where he felt like his legs might give out on him, but this completely resolved and he came back completely to normal. They went to a baseball game and were climbing bleachers and his wife noticed that he didn't have any issues. Around 11:15 PM, his wife went upstairs take a bath and then when she came out she found he was on the ground unable to speak. He was brought in as a code stroke, and initially there was some delay with IV TPA due to not being able to getfull history, but risks and benefits were discussed with his wife then treatment was undertaken. CTAwas performed which demonstrated left M1 occlusion and therefore Dr. Loreta [name] with interventional radiology was consulted.Prior to starting IV TPA, he did have some improvement and began following some commands, but still had a disabling deficit and therefore tPA was administered. He was admitted to the neuro ICU for further evaluation and treatment. His blood pressure was tightly controlled. He had close neurological monitoring. He was extubated and did well. Had mild aphasia, dysphagia and vitamin (but showed gradual improvement. He was started on dual antiplatelet therapy of aspirin and Plavix. Echocardiogram showed normal ejection fraction. LDL cholesterol 78 mg  percent. Hemoglobin A1c was 7.0. On subsequent carotid ultrasound the left carotid artery appeared to be occluded and the right had 80-99% near occlusive stenosis. Patient was transferred to inpatient rehabilitation and did well. He subsequently came back an elective right carotid angioplasty and stenting by Dr. Corliss [name] on 03/07/17 which went well. His currently on aspirin 81 mg and Brilinta 90 mg twice daily. He states he has made steady improvement. His speech is much improved though he has to speak slowly. He does slurred a few words and has some word finding difficulty. His comprehension is quite good. His noted improvement in the right-sided strength as well but fine motor skills are diminished in the right hand. He does get tired more easily. However his able to do most activities of daily living and is independent. He has even started driving during the daytime. He has started going to the gym regularly and does some weights and treadmill. He has finished outpatient physical therapy but is continuing occupational and speech therapy at present. In fact wants to go back to work in 3-4 weeks.   ROS:   14 system review of systems is positive for  speech difficulty, slurred speech, decreased stamina, fatigue, weakness, anxiety, snoring, hearing loss and all other systems negative PMH:  Past Medical History:  Diagnosis Date  . Anxiety   . Arthritis    hands  . BPH (benign prostatic hyperplasia)   . Depression   . 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 ED (erectile dysfunction)   . GERD (gastroesophageal reflux disease)   . H/O calcium pyrophosphate  deposition disease (CPPD)   . HLD (hyperlipidemia)   . Hypertension   . Hypogonadism in male   . Lower urinary tract symptoms (LUTS)   . Nocturia   . Stroke [facility](HCC)    Language Impairment.  Left Hemiparesis- improved  . Testosterone overdose   . Vitamin D deficiency     Social History:  Social History   Social  History  . Marital status: Married    Spouse name: N/A  . Number of children: N/A  . Years of education: N/A   Occupational History  . Not on file.   Social History Main Topics  . Smoking status: Never Smoker  . Smokeless tobacco: Never Used  . Alcohol use 4.8 oz/week    6 Shots of liquor, 2 Glasses of wine per week     Comment: drinks every day vodka with water  . Drug use: No  . Sexual activity: Yes   Other Topics Concern  . Not on file   Social History Narrative  . No narrative on file    Medications:   Current Outpatient Prescriptions on File Prior to Visit  Medication Sig Dispense Refill  . acetaminophen (TYLENOL) 325 MG tablet Take 2 tablets (650 mg total) by mouth every 4 (four) hours as needed for mild pain.    Marland Kitchen. amLODipine (NORVASC) 2.5 MG tablet Take 1 tablet by mouth daily.    Marland Kitchen. aspirin 81 MG tablet Take 1 tablet (81 mg total) by mouth daily.    . CRESTOR 40 MG tablet Take 1 tablet (40 mg total) by mouth daily. 30 tablet 0  . FLUoxetine (PROZAC) 20 MG capsule Take 1 capsule (20 mg total) by mouth daily. 30 capsule 0  . hydrochlorothiazide (HYDRODIURIL) 25 MG tablet Take 1 tablet (25 mg total) by mouth daily. 30 tablet 0  . losartan (COZAAR) 25 MG tablet Take 1 tablet (25 mg total) by mouth daily. 30 tablet 0  . Melatonin 3 MG TABS Take 1 tablet (3 mg total) by mouth at bedtime as needed (sleep).  0  . metFORMIN (GLUCOPHAGE) 500 MG tablet Take 1 tablet (500 mg total) by mouth daily with breakfast. 30 tablet 0  . Multiple Vitamin (MULTIVITAMIN) tablet Take 1 tablet by mouth daily.    . pantoprazole (PROTONIX) 40 MG tablet Take 1 tablet (40 mg total) by mouth at bedtime. 30 tablet 0  . tamsulosin (FLOMAX) 0.4 MG CAPS capsule Take 1 capsule (0.4 mg total) by mouth daily after supper. 30 capsule 0  . ticagrelor (BRILINTA) 90 MG TABS tablet Take 1 tablet (90 mg total) by mouth 2 (two) times daily. 60 tablet 1   No current facility-administered medications on file prior  to visit.     Allergies:   Allergies  Allergen Reactions  . Shellfish-Derived Products Hives  . Celecoxib Nausea And Vomiting    Physical Exam General: Obese middle-age Caucasian male, seated, in no evident distress Head: head normocephalic and atraumatic.  Neck: supple with no carotid or supraclavicular bruits Cardiovascular: regular rate and rhythm, no murmurs Musculoskeletal: no deformity Skin:  no rash/petichiae Vascular:  Normal pulses all extremities Vitals:   05/22/17 0852  BP: 125/70  Pulse: 71   Neurologic Exam Mental Status: Awake and fully alert. Oriented to place and time. Recent and remote memory intact. Attention span, concentration and fund of knowledge appropriate. Mood and affect appropriate. Mild expressive aphasia with some loss of fluency and word finding difficulties. Mild dysarthria but can be understood easily. Cranial Nerves: Fundoscopic  exam reveals sharp disc margins. Pupils equal, briskly reactive to light. Extraocular movements full without nystagmus. Visual fields full to confrontation. Hearing intact. Facial sensation intact. Mild right lower facial weakness., tongue, palate moves normally and symmetrically.  Motor: Normal bulk and tone. Normal strength in all tested extremity muscles. Mild weakness of intrinsic hand muscles on the right. Orbits left over right upper extremity. Mild weakness of right ankle dorsiflexors. Sensory.: intact to touch ,pinprick .position and vibratory sensation.  Coordination: Rapid alternating movements normal in all extremities. Finger-to-nose and heel-to-shin performed accurately bilaterally. Gait and Station: Arises from chair without difficulty. Stance is normal. Gait demonstrates normal stride length and balance slight dragging of the right foot with mild right foot drop. . Able to heel, toe and tandem walk without difficulty.  Reflexes: 1+ and symmetric. Toes downgoing.   NIHSS  3 Modified Rankin  3   ASSESSMENT:  61 year old Caucasian male with embolic left middle cerebral artery infarct in April 2018 secondary to proximal high-grade carotid stenosis treated with IV TPA and mechanical embolectomy with excellent clinical outcome. History of high-grade proximal right ICA stenosis status post elective right carotid angioplasty and stenting in May 2018. Multiple vascular risk factors of diabetes, hypertension, hyperlipidemia, obesity and carotid disease. PLAN: I had a long d/w patient and his wifeabout his recent stroke, carotid stenosis and stenting,risk for recurrent stroke/TIAs, personally independently reviewed imaging studies and stroke evaluation results and answered questions.Continue aspirin 81 mg daily and  Brillinta 90 mg twice daily  for secondary stroke prevention  X 6 months then stop brillinta and maintain strict control of hypertension with blood pressure goal below 130/90, diabetes with hemoglobin A1c goal below 6.5% and lipids with LDL cholesterol goal below 70 mg/dL. I also advised the patient to eat a healthy diet with plenty of whole grains, cereals, fruits and vegetables, exercise regularly and maintain ideal body weight. He may return to work on 06/18/17 for 8 hrs per day and increase as tolerated in 2 weeks to 10 hrs per day 4 days per week. Followup in the future with my nurse practitioner in 3 months or call earlier if necessary Greater than 50% of time during this 35 minute visit was spent on counseling,explanation of diagnosis, planning of further management, discussion with patient and family and coordination of care Delia Heady, MD  [facility] [address] [place], [place] [address]  Phone [phone] Fax [phone] Note: This document was prepared with digital dictation and possible smart phrase technology. Any transcriptional errors that result from this process are unintentional

## 2017-05-23 ENCOUNTER — Encounter: Payer: Self-pay | Admitting: Speech Pathology

## 2017-05-23 ENCOUNTER — Ambulatory Visit: Payer: Managed Care, Other (non HMO) | Admitting: Occupational Therapy

## 2017-05-23 ENCOUNTER — Ambulatory Visit: Payer: Managed Care, Other (non HMO) | Admitting: Speech Pathology

## 2017-05-23 ENCOUNTER — Encounter: Payer: Self-pay | Admitting: Occupational Therapy

## 2017-05-23 ENCOUNTER — Ambulatory Visit: Payer: Managed Care, Other (non HMO)

## 2017-05-23 DIAGNOSIS — M6281 Muscle weakness (generalized): Secondary | ICD-10-CM | POA: Diagnosis not present

## 2017-05-23 DIAGNOSIS — R278 Other lack of coordination: Secondary | ICD-10-CM

## 2017-05-23 DIAGNOSIS — R471 Dysarthria and anarthria: Secondary | ICD-10-CM

## 2017-05-23 DIAGNOSIS — R4701 Aphasia: Secondary | ICD-10-CM

## 2017-05-23 NOTE — Therapy (Signed)
[facility] MAIN [facility] SERVICES [address] [place], [place], [address] Phone: [phone]   Fax:  [phone]  Occupational Therapy Treatment  Patient Details  Name: Erik [name] MRN: [id number] Date of Birth: 03-29-1956 Referring Provider: Dr. Hildred Alamin  Encounter Date: 05/23/2017      OT End of Session - 05/23/17 0936    Visit Number 17   Number of Visits 24   Date for OT Re-Evaluation 06/04/17   OT Start Time 0915   OT Stop Time 1000   OT Time Calculation (min) 45 min   Activity Tolerance Patient tolerated treatment well   Behavior During Therapy [facility] for tasks assessed/performed      Past Medical History:  Diagnosis Date  . Anxiety   . Arthritis    hands  . BPH (benign prostatic hyperplasia)   . Depression   . 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 ED (erectile dysfunction)   . GERD (gastroesophageal reflux disease)   . H/O calcium pyrophosphate deposition disease (CPPD)   . HLD (hyperlipidemia)   . Hypertension   . Hypogonadism in male   . Lower urinary tract symptoms (LUTS)   . Nocturia   . Stroke [facility])    Language Impairment.  Left Hemiparesis- improved  . Testosterone overdose   . Vitamin D deficiency     Past Surgical History:  Procedure Laterality Date  . BACK SURGERY    . COLON SURGERY     Colon Resection  . COLONOSCOPY    . IR ANGIO INTRA EXTRACRAN SEL COM CAROTID INNOMINATE UNI L MOD SED  03/07/2017  . IR ANGIO INTRA EXTRACRAN SEL COM CAROTID INNOMINATE UNI R MOD SED  02/16/2017  . IR ANGIO VERTEBRAL SEL SUBCLAVIAN INNOMINATE UNI L MOD SED  03/07/2017  . IR ANGIO VERTEBRAL SEL VERTEBRAL UNI R MOD SED  03/07/2017  . IR INFUSION THROMBOL ARTERIAL INITIAL (MS)  03/07/2017  . IR INTRAVSC STENT CERV CAROTID W/EMB-PROT MOD SED INCL ANGIO  03/07/2017  . IR PERCUTANEOUS ART THROMBECTOMY/INFUSION INTRACRANIAL INC DIAG ANGIO  02/16/2017  . IR PTA NON CORO-LOWER EXTREM  02/16/2017  . IR RADIOLOGIST  EVAL & MGMT  04/04/2017  . IR US GUIDE VASC ACCESS LEFT  02/16/2017  . IR US GUIDE VASC ACCESS RIGHT  02/16/2017  . KNEE ARTHROSCOPY Bilateral   . RADIOLOGY WITH ANESTHESIA N/A 02/16/2017   Procedure: RADIOLOGY WITH ANESTHESIA;  Surgeon: Julieanne Cotton, MD;  Location: [facility] OR;  Service: Radiology;  Laterality: N/A;  . RADIOLOGY WITH ANESTHESIA N/A 03/07/2017   Procedure: CAROTID STENT;  Surgeon: Julieanne Cotton, MD;  Location: [facility] OR;  Service: Radiology;  Laterality: N/A;  . ROTATOR CUFF REPAIR Right     There were no vitals filed for this visit.      Subjective Assessment - 05/23/17 0928    Subjective  Pt. reports he would like to work on his writing.   Patient is accompained by: Family member   Pertinent History Pt. is a 61 y.o. male who had a CVA on the evening of April 12th, 2018. Pt. had been to his grandson's ball game, and was preparing for bed when he felling getting into bed. According to his wife, he struggled to get up. She could tell he was having a stroke, and called EMS. Pt. was transported to [facility],, was administered TPA,. Pt. underwent a cerebral angiogram with Rt ICA stent assisted angioplasty. Pt. received Therapy services in [facility]. at [facility].   Currently in Pain? No/denies      OT TREATMENT    Selfcare:  Pt. Worked on writing speed and legibility. Emphasis was placed on pen grasp, and finger-wrist placement during written script. Pt. Worked on decreasing hand stiffness when writing. Writing speed: 21 min & 06 To write a 3 long sentence paragraph.                             OT Education - 05/23/17 0935    Education provided Yes   Education Details right hand [facility] skills, and writing.   Person(s) Educated Patient   Methods Explanation   Comprehension Verbalized understanding             OT Long Term Goals - 05/03/17 1130      OT LONG TERM GOAL #1   Title Pt. will independently, and efficiently write 3 sentences with 100%  legibility.   Baseline Increased time required. 90% legibility for only 2 words in large print.   Time 12   Period Weeks   Status On-going     OT LONG TERM GOAL #2   Title Pt. will independently type a paragraph efficiently.   Baseline Pt. is unable   Time 12   Period Weeks   Status On-going     OT LONG TERM GOAL #3   Title Pt. improve right grip strength by 5 pounds to be able to hold and use a wrench.   Baseline Pt. is unable   Time 12   Period Weeks   Status On-going     OT LONG TERM GOAL #4   Title Pt. will improve RUE strength, and hand function skills for independence with work related tasks.   Baseline Pt. is unable to apply toothpaste onto a toothbrush.   Time 12   Period Weeks   Status Revised     OT LONG TERM GOAL #5   Title Pt. will improve FMC by 10 sec. to be able to independently handle, and count change.   Baseline Pt. has difficulty   Time 12   Period Weeks   Status On-going     OT LONG TERM GOAL #6   Title Pt. will increase RUE strength by 2mm grades to be able to complete ADLs/IADLs.   Baseline Pt. has difficulty   Time 12   Period Weeks   Status On-going     OT LONG TERM GOAL #7   Title Pt. will demonstrate independence with visual compensatory strategies for the right 100% of the time during ADL, and IADL tasks.   Baseline Limited on the right. Pt. occ. misses obstacle on the right.   Time 12   Period Weeks   Status On-going               Plan - 05/23/17 0936    Clinical Impression Statement Pt. reports he got good news from the Neurologist. Pt. reports he has been cleared to go back to work when his therapy is over.  Pt. reports he has a return appointment with the PM&R Physicianon 06/04/2017. Pt. continues to to work on right UE strength, and [facility] skills for IADL tasks and writing.   Occupational performance deficits (Please refer to evaluation for details): ADL's   Rehab Potential Good   OT Frequency 2x / week   OT Duration 12 weeks    OT Treatment/Interventions Self-care/ADL training;Therapeutic exercise;Therapeutic exercises;Therapeutic activities;DME and/or AE instruction;Neuromuscular education;Patient/family education;Functional  Mobility Training;Cognitive remediation/compensation;Visual/perceptual remediation/compensation;Energy conservation   Consulted and Agree with Plan of Care Patient   Family Member Consulted Wife      Patient will benefit from skilled therapeutic intervention in order to improve the following deficits and impairments:  Decreased coordination, Decreased balance, Impaired UE functional use, Decreased cognition, Impaired vision/preception, Decreased strength, Decreased knowledge of use of DME, Decreased activity tolerance, Impaired tone, Impaired flexibility, Decreased range of motion, Decreased endurance  Visit Diagnosis: Muscle weakness (generalized)  Other lack of coordination    Problem List Patient Active Problem List   Diagnosis Date Noted  . Hemiparesis affecting right side as late effect of cerebrovascular accident (CVA) (HCC) 03/16/2017  . Aphasia, late effect of cerebrovascular disease 03/16/2017  . Gait disturbance, post-stroke 03/16/2017  . Dysarthria as late effect of stroke 03/16/2017  . Carotid artery stenosis with cerebral infarction (HCC) 03/07/2017  . Language impairment   . ICAO (internal carotid artery occlusion), left 02/22/2017  . Acute respiratory failure (HCC) 02/22/2017  . Obesity 02/22/2017  . Expressive aphasia 02/22/2017  . Acute ischemic left MCA stroke (HCC) 02/22/2017  . Left hemiparesis (HCC)   . Status post total bilateral knee replacement   . Benign essential HTN   . Right-sided extracranial carotid artery stenosis   . Dysphagia, post-stroke   . Acute blood loss anemia   . Nocturia   . Newly diagnosed diabetes (HCC)   . CVA (cerebral vascular accident) (HCC) - L MCA s/p tPA and thrombectomy 02/16/2017  . Anxiety 04/30/2015  . Benign fibroma of  prostate 04/30/2015  . Clinical depression 04/30/2015  . Acid reflux 04/30/2015  . HLD (hyperlipidemia) 04/30/2015  . BP (high blood pressure) 04/30/2015  . Arthritis, degenerative 04/30/2015  . 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 Arthritis, septic, knee (HCC) 09/26/2012    Olegario Messier, MS, OTR/L 05/23/2017, 9:49 AM  [facility] MAIN [facility] SERVICES [address] [place], [place], [address] Phone: [phone]   Fax:  [phone]  Name: Erik [name] MRN: [id number] Date of Birth: [birth year]

## 2017-05-23 NOTE — Therapy (Signed)
[facility] MAIN [facility] SERVICES [address] [place], [place], [address] Phone: [phone]   Fax:  [phone]  Speech Language Pathology Treatment  Patient Details  Name: Erik [name] MRN: [id number] Date of Birth: June 28, 1956 Referring Provider: Charlton Amor   Encounter Date: 05/23/2017      End of Session - 05/23/17 1128    Visit Number 18   Number of Visits 20   Date for SLP Re-Evaluation 06/12/17   SLP Start Time 1005   SLP Stop Time  1100   SLP Time Calculation (min) 55 min   Activity Tolerance Patient tolerated treatment well      Past Medical History:  Diagnosis Date  . Anxiety   . Arthritis    hands  . BPH (benign prostatic hyperplasia)   . Depression   . 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 ED (erectile dysfunction)   . GERD (gastroesophageal reflux disease)   . H/O calcium pyrophosphate deposition disease (CPPD)   . HLD (hyperlipidemia)   . Hypertension   . Hypogonadism in male   . Lower urinary tract symptoms (LUTS)   . Nocturia   . Stroke [facility])    Language Impairment.  Left Hemiparesis- improved  . Testosterone overdose   . Vitamin D deficiency     Past Surgical History:  Procedure Laterality Date  . BACK SURGERY    . COLON SURGERY     Colon Resection  . COLONOSCOPY    . IR ANGIO INTRA EXTRACRAN SEL COM CAROTID INNOMINATE UNI L MOD SED  03/07/2017  . IR ANGIO INTRA EXTRACRAN SEL COM CAROTID INNOMINATE UNI R MOD SED  02/16/2017  . IR ANGIO VERTEBRAL SEL SUBCLAVIAN INNOMINATE UNI L MOD SED  03/07/2017  . IR ANGIO VERTEBRAL SEL VERTEBRAL UNI R MOD SED  03/07/2017  . IR INFUSION THROMBOL ARTERIAL INITIAL (MS)  03/07/2017  . IR INTRAVSC STENT CERV CAROTID W/EMB-PROT MOD SED INCL ANGIO  03/07/2017  . IR PERCUTANEOUS ART THROMBECTOMY/INFUSION INTRACRANIAL INC DIAG ANGIO  02/16/2017  . IR PTA NON CORO-LOWER EXTREM  02/16/2017  . IR RADIOLOGIST EVAL & MGMT  04/04/2017  . IR US GUIDE VASC  ACCESS LEFT  02/16/2017  . IR US GUIDE VASC ACCESS RIGHT  02/16/2017  . KNEE ARTHROSCOPY Bilateral   . RADIOLOGY WITH ANESTHESIA N/A 02/16/2017   Procedure: RADIOLOGY WITH ANESTHESIA;  Surgeon: Julieanne Cotton, MD;  Location: [facility] OR;  Service: Radiology;  Laterality: N/A;  . RADIOLOGY WITH ANESTHESIA N/A 03/07/2017   Procedure: CAROTID STENT;  Surgeon: Julieanne Cotton, MD;  Location: [facility] OR;  Service: Radiology;  Laterality: N/A;  . ROTATOR CUFF REPAIR Right     There were no vitals filed for this visit.      Subjective Assessment - 05/23/17 1127    Subjective  Patient said he has been cleared by his doctor to go back to work, so he is looking forward to that but is also a bit nervous about it.   Currently in Pain? No/denies               ADULT SLP TREATMENT - 05/23/17 0001      General Information   Behavior/Cognition Alert;Cooperative;Pleasant mood   HPI 61 year old man with left MCA CVA 02/15/2017.  Patient received inpatient rehab services, including SLP, 02/22/2017-03/02/2017.     Treatment Provided   Treatment provided Cognitive-Linquistic     Pain Assessment   Pain Assessment No/denies pain  Cognitive-Linquistic Treatment   Treatment focused on Aphasia;Dysarthria   Skilled Treatment Clinician and patient went for a walk to practice clear speech while moving. Patient was 90% intelligible throughout the 10 minute walk. Patient sorted two decks of cards while having a conversation with clinician to simulate multitasking activities. Patient was 80% intelligible during this activity. Patient completed a functional exercise that required him to read aloud, make inferences about the content, and answer a question with 85% accuracy. The patient's reading content accuracy was 75% and his intelligibility was 90% during the task.      Assessment / Recommendations / Plan   Plan Continue with current plan of care     Progression Toward Goals   Progression toward goals  Progressing toward goals          SLP Education - 05/23/17 1127    Education provided Yes   Education Details Strategies for communicating when he goes back to work   [employer] HotelsPerson(s) Educated Patient   Methods Explanation   Comprehension Verbalized understanding            SLP Long Term Goals - 04/19/17 1231      SLP LONG TERM GOAL #1   Title Pt will improve speech intelligibility in response to moderately complex cognitive linguistic tasks by controlling rate of speech, over-articulation, and increased loudness to achieve 80% intelligibility.   Status On-going     SLP LONG TERM GOAL #2   Title Patient will read aloud sentences and multi-syllabic words, maintaining phonemic accuracy, with 80% accuracy.     Time 12   Period Weeks   Status On-going     SLP LONG TERM GOAL #3   Title Patient will demonstrate reading comprehension for paragraphs with 80% accuracy.    Time 12   Period Weeks   Status On-going     SLP LONG TERM GOAL #4   Title Patient will complete high level word finding tasks with 80% accuracy.   Time 12   Period Weeks   Status On-going          Plan - 05/23/17 1129    Clinical Impression Statement Patient is showing the ability to self-monitor and self-correct his reading errors by using context clues. Patient is able to produce intelligible speech in conversation and across functional tasks when he is focused on doing so. Clinician will continue to challenge patient with multitasking and conversation activities.   Speech Therapy Frequency 2x / week   Duration Other (comment)   Treatment/Interventions Patient/family education;Compensatory strategies;SLP instruction and feedback;Functional tasks;Cueing hierarchy   Potential to Achieve Goals Good   Potential Considerations Ability to learn/carryover information;Co-morbidities;Cooperation/participation level;Medical prognosis;Previous level of function;Severity of impairments;Family/community support   SLP  Home Exercise Plan Daily conversation with family members   Consulted and Agree with Plan of Care Patient      Patient will benefit from skilled therapeutic intervention in order to improve the following deficits and impairments:   Dysarthria  Aphasia    Problem List Patient Active Problem List   Diagnosis Date Noted  . Hemiparesis affecting right side as late effect of cerebrovascular accident (CVA) (HCC) 03/16/2017  . Aphasia, late effect of cerebrovascular disease 03/16/2017  . Gait disturbance, post-stroke 03/16/2017  . Dysarthria as late effect of stroke 03/16/2017  . Carotid artery stenosis with cerebral infarction (HCC) 03/07/2017  . Language impairment   . ICAO (internal carotid artery occlusion), left 02/22/2017  . Acute respiratory failure (HCC) 02/22/2017  . Obesity 02/22/2017  . Expressive  aphasia 02/22/2017  . Acute ischemic left MCA stroke (HCC) 02/22/2017  . Left hemiparesis (HCC)   . Status post total bilateral knee replacement   . Benign essential HTN   . Right-sided extracranial carotid artery stenosis   . Dysphagia, post-stroke   . Acute blood loss anemia   . Nocturia   . Newly diagnosed diabetes (HCC)   . CVA (cerebral vascular accident) (HCC) - L MCA s/p tPA and thrombectomy 02/16/2017  . Anxiety 04/30/2015  . Benign fibroma of prostate 04/30/2015  . Clinical depression 04/30/2015  . Acid reflux 04/30/2015  . HLD (hyperlipidemia) 04/30/2015  . BP (high blood pressure) 04/30/2015  . Arthritis, degenerative 04/30/2015  . 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 Arthritis, septic, knee (HCC) 09/26/2012    Clydene Pugh 05/23/2017, 12:09 PM  [place] [facility] MAIN [facility] SERVICES [address] [place], [place], [address] Phone: [phone]   Fax:  [phone]   Name: Erik [name] MRN: [id number] Date of Birth: [birth year]

## 2017-05-23 NOTE — Therapy (Signed)
[place] MAIN [facility] SERVICES [address] [place], [place], [address] Phone: [phone]   Fax:  [phone]  Physical Therapy Treatment  Patient Details  Name: Erik [name] MRN: [id number] Date of Birth: August 20, 1956 Referring Provider: Jethro Bolus  Encounter Date: 05/23/2017      PT End of Session - 05/23/17 1208    Visit Number 17   Number of Visits 27   Date for PT Re-Evaluation 06/14/17   PT Start Time 1114   PT Stop Time 1200   PT Time Calculation (min) 46 min   Equipment Utilized During Treatment Other (comment);Gait belt  S-min A prn   Activity Tolerance Patient tolerated treatment well   Behavior During Therapy [facility] for tasks assessed/performed      Past Medical History:  Diagnosis Date  . Anxiety   . Arthritis    hands  . BPH (benign prostatic hyperplasia)   . Depression   . Diabetes mellitus without complication ([place]) 25/0037   Type II  . ED (erectile dysfunction)   . GERD (gastroesophageal reflux disease)   . H/O calcium pyrophosphate deposition disease (CPPD)   . HLD (hyperlipidemia)   . Hypertension   . Hypogonadism in male   . Lower urinary tract symptoms (LUTS)   . Nocturia   . Stroke [facility])    Language Impairment.  Left Hemiparesis- improved  . Testosterone overdose   . Vitamin D deficiency     Past Surgical History:  Procedure Laterality Date  . BACK SURGERY    . COLON SURGERY     Colon Resection  . COLONOSCOPY    . IR ANGIO INTRA EXTRACRAN SEL COM CAROTID INNOMINATE UNI L MOD SED  03/07/2017  . IR ANGIO INTRA EXTRACRAN SEL COM CAROTID INNOMINATE UNI R MOD SED  02/16/2017  . IR ANGIO VERTEBRAL SEL SUBCLAVIAN INNOMINATE UNI L MOD SED  03/07/2017  . IR ANGIO VERTEBRAL SEL VERTEBRAL UNI R MOD SED  03/07/2017  . IR INFUSION THROMBOL ARTERIAL INITIAL (MS)  03/07/2017  . IR INTRAVSC STENT CERV CAROTID W/EMB-PROT MOD SED INCL ANGIO  03/07/2017  . IR PERCUTANEOUS ART THROMBECTOMY/INFUSION INTRACRANIAL INC DIAG  ANGIO  02/16/2017  . IR PTA NON CORO-LOWER EXTREM  02/16/2017  . IR RADIOLOGIST EVAL & MGMT  04/04/2017  . IR US GUIDE VASC ACCESS LEFT  02/16/2017  . IR US GUIDE VASC ACCESS RIGHT  02/16/2017  . KNEE ARTHROSCOPY Bilateral   . RADIOLOGY WITH ANESTHESIA N/A 02/16/2017   Procedure: RADIOLOGY WITH ANESTHESIA;  Surgeon: Luanne Bras, MD;  Location: [place];  Service: Radiology;  Laterality: N/A;  . RADIOLOGY WITH ANESTHESIA N/A 03/07/2017   Procedure: CAROTID STENT;  Surgeon: Luanne Bras, MD;  Location: [place];  Service: Radiology;  Laterality: N/A;  . ROTATOR CUFF REPAIR Right     There were no vitals filed for this visit.      Subjective Assessment - 05/23/17 1205    Subjective Patient continues to work out at home gym to improve mobility. Hasn't heard back from doctor about PT for R shoulder   Pertinent History Pt. is a 61 y.o. male who had a CVA on the evening of April 12th, 2018. Pt had been to his grandson's ball game that day and watched a movie with his wife. He got in bed and his wife went to take a bath. When she came out pt was on the floor struggling to get back in bed. Pt had become weak while in bed, tried  to sit up and fell out of the bed. She could tell he was having a stroke, and called EMS. Pt was transported to [facility] and CTA head/neck revealed hyperdense L-MCA sign likely indicating acute thrombus. He received tPA and he underwent cerebral angiogram with L-MCA thrombectomy, dense calcified plaque L-ICA precluded stent placement and noted to have 73% stenosis R-ICA due to advanced atherosclerotic changes. Patient with resultant facial weakness with mild dysarthria, aphasia, mild left gaze preference and right sided weakness affecting mobility and ability to carry out ADL tasks. He was in CIR for approximately 8 days per patient and then discharged home. He has been evaluated by outpatient speech and occupational therapy and is now here with wife today for PT evaluation.     Patient Stated Goals Improve his strength and balance in order to improve his function at home and be able to return to work.    Currently in Pain? No/denies    Neuro re-ed  Airex pad: arms in front clap thenarms in back with hands on bottom circles: 20x Bosu ball: balance while forward flexion of shoulders with 5lb bar, occasional LOB 15x  Bosu ball, balance while shoulder abducts with 2lb dumbbells no LOB 15x    TherEx Ambulating 40 ft with turns with shoulder extension with 1.5lb bar while stepping.  TRX squats with abduction at top range 10x TRX lunges with crane lunge at top 10x [employer] second holds, cues for proper body alignment Sit to stand squats with upright row 30with 5lb dumbbells 15x, cues for knee alignment Heel raises with shoulder flexion against wall to promote increased motion 20x        PT Long Term Goals - 05/23/17 1211      PT LONG TERM GOAL #1   Title Pt will be independent with HEP in order to improve strength and balance in order to decrease fall risk and improve function at home and work.    Baseline given and demonstrated understanding   Time 8   Period Weeks   Status Achieved     PT LONG TERM GOAL #2   Title Pt will improve BERG by at least 3 points in order to demonstrate clinically significant improvement in balance.   Baseline 6/28:54/56 ;  03/14/17: 49/56   Time 8   Period Weeks   Status Achieved     PT LONG TERM GOAL #3   Title Pt will improve DGI by at least 3 points in order to demonstrate clinically significant improvement in balance and decreased risk for falls    Baseline 6/28: 21/24; 03/14/17: 17/24   Time 8   Period Weeks   Status Achieved     PT LONG TERM GOAL #4   Title Pt will improve R hip abduction, adduction, and flexion strength by at least 1/2 MMT in order to improve gait    Baseline 6/28: R df4-, everything else 4 to 4+. 03/14/17: see flowsheet 7/11: 5/5   Time 8   Status Achieved     PT LONG TERM GOAL #5   Title Pt will  improve DGI to 23/24 points in order to demonstrate improvement in balance and decreased risk for falls    Baseline 6/28: 21/24 7/11 24/24   Time 6   Period Weeks   Status Achieved     PT LONG TERM GOAL #6   Title Patient will ambulate >2000 ft in 6 minute walk test in order to be near age norm for gait.   Baseline  6/28: 1100 ft with stumbles 7/11: 1522f   Time 6   Period Weeks   Status Partially Met               Plan - 05/23/17 1209    Clinical Impression Statement Patient has progressed with functional mobility and will focus on independent home exercise program at this time. All but one goal has been met.  Implementation of UE movements with dynamic stability tasks challenged patient's balance. R shoulder continues to limit patient most at this time. Have requested referral for R shoulder.    Rehab Potential Good   Clinical Impairments Affecting Rehab Potential Positive: age, motivation, social support; Negative: significant ICA stenosis   PT Frequency 1x / week   PT Duration 6 weeks   PT Treatment/Interventions ADLs/Self Care Home Management;Aquatic Therapy;Biofeedback;Cryotherapy;Electrical Stimulation;Iontophoresis 464mml Dexamethasone;Moist Heat;Ultrasound;DME Instruction;Gait training;Stair training;Functional mobility training;Therapeutic activities;Therapeutic exercise;Balance training;Neuromuscular re-education;Patient/family education;Manual techniques;Passive range of motion;Energy conservation   PT Home Exercise Plan Hip 3 ways with RTB, mini squats, RTB resisted R ankle DF, toe taps, semitandem/tandem balance; Bil concentric and eccentric R calf raises; R hamstring curls wiith GTB   Consulted and Agree with Plan of Care Patient      Patient will benefit from skilled therapeutic intervention in order to improve the following deficits and impairments:  Abnormal gait, Decreased balance, Decreased coordination, Decreased mobility, Decreased strength, Difficulty  walking  Visit Diagnosis: Muscle weakness (generalized)  Other lack of coordination     Problem List Patient Active Problem List   Diagnosis Date Noted  . Hemiparesis affecting right side as late effect of cerebrovascular accident (CVA) (HCChimayo05/09/2017  . Aphasia, late effect of cerebrovascular disease 03/16/2017  . Gait disturbance, post-stroke 03/16/2017  . Dysarthria as late effect of stroke 03/16/2017  . Carotid artery stenosis with cerebral infarction (HCNora05/12/2016  . Language impairment   . ICAO (internal carotid artery occlusion), left 02/22/2017  . Acute respiratory failure (HCPeters[place]04/19/2018  . Obesity 02/22/2017  . Expressive aphasia 02/22/2017  . Acute ischemic left MCA stroke (HCBedford04/19/2018  . Left hemiparesis ([facility]  . Status post total bilateral knee replacement   . Benign essential HTN   . Right-sided extracranial carotid artery stenosis   . Dysphagia, post-stroke   . Acute blood loss anemia   . Nocturia   . Newly diagnosed diabetes (HCP[place]  . CVA (cerebral vascular accident) ([facility]CCata[place]oul[place]- L MCA s/p tPA and thrombectomy 02/16/2017  . Anxiety 04/30/2015  . Benign fibroma of prostate 04/30/2015  . Clinical depression 04/30/2015  . Acid reflux 04/30/2015  . HLD (hyperlipidemia) 04/30/2015  . BP (high blood pressure) 04/30/2015  . Arthritis, degenerative 04/30/2015  . 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 Arthritis, septic, knee (HCTiffin11/21/2013   MaJanna ArchPT, DPT   MaJanna [name]/18/2018, 12:14 PM  [place] [facility] Baptist[facility] [address]: [phone] Fax:  [phone]: JaFILIPE GREATHOUSERN: [id number] of Birth: 05/12/22/1957

## 2017-05-24 ENCOUNTER — Telehealth: Payer: Self-pay

## 2017-05-24 NOTE — Telephone Encounter (Signed)
Rn spoke with Toniann FailWendy pts spouse. Rn stated the forms will be ready for pick up in medical records. Pts wife [place]. She will come on July 26 and pay the 50.00 dollars for the forms. Rn stated a copy will be sent to medical records. Rn stated a copy will be in medical records. Pts wife verbalized understanding.

## 2017-05-24 NOTE — Progress Notes (Addendum)
On 06/08/2017 office notes and restricted work form, and return to work form all fax to [phone].All pages of office notes, and form fax again.   Rn [place]  [place] 05/22/2017 per their request. The form wanted return to work date, and office notes written on the form. Form filled out and fax to. Fax confirmed with 11 pages of office notes fax to [phone].

## 2017-05-28 ENCOUNTER — Ambulatory Visit: Payer: Managed Care, Other (non HMO)

## 2017-05-28 ENCOUNTER — Ambulatory Visit: Payer: Managed Care, Other (non HMO) | Admitting: Speech Pathology

## 2017-05-28 ENCOUNTER — Ambulatory Visit: Payer: Managed Care, Other (non HMO) | Admitting: Occupational Therapy

## 2017-05-28 ENCOUNTER — Encounter: Payer: Self-pay | Admitting: Speech Pathology

## 2017-05-28 DIAGNOSIS — R278 Other lack of coordination: Secondary | ICD-10-CM

## 2017-05-28 DIAGNOSIS — M6281 Muscle weakness (generalized): Secondary | ICD-10-CM

## 2017-05-28 DIAGNOSIS — R4701 Aphasia: Secondary | ICD-10-CM

## 2017-05-28 DIAGNOSIS — R471 Dysarthria and anarthria: Secondary | ICD-10-CM

## 2017-05-28 NOTE — Therapy (Signed)
[facility] MAIN [facility] SERVICES [address] [place], [place], [address] Phone: [phone]   Fax:  [phone]  Speech Language Pathology Treatment  Patient Details  Name: DUFFY DANTONIO MRN: [id number] Date of Birth: 05/01/1956 Referring Provider: Charlton Amor   Encounter Date: 05/28/2017      End of Session - 05/28/17 1215    Visit Number 19   Number of Visits 20   Date for SLP Re-Evaluation 06/12/17   SLP Start Time 1000   SLP Stop Time  1100   SLP Time Calculation (min) 60 min   Activity Tolerance Patient tolerated treatment well      Past Medical History:  Diagnosis Date  . Anxiety   . Arthritis    hands  . BPH (benign prostatic hyperplasia)   . Depression   . 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 ED (erectile dysfunction)   . GERD (gastroesophageal reflux disease)   . H/O calcium pyrophosphate deposition disease (CPPD)   . HLD (hyperlipidemia)   . Hypertension   . Hypogonadism in male   . Lower urinary tract symptoms (LUTS)   . Nocturia   . Stroke [facility])    Language Impairment.  Left Hemiparesis- improved  . Testosterone overdose   . Vitamin D deficiency     Past Surgical History:  Procedure Laterality Date  . BACK SURGERY    . COLON SURGERY     Colon Resection  . COLONOSCOPY    . IR ANGIO INTRA EXTRACRAN SEL COM CAROTID INNOMINATE UNI L MOD SED  03/07/2017  . IR ANGIO INTRA EXTRACRAN SEL COM CAROTID INNOMINATE UNI R MOD SED  02/16/2017  . IR ANGIO VERTEBRAL SEL SUBCLAVIAN INNOMINATE UNI L MOD SED  03/07/2017  . IR ANGIO VERTEBRAL SEL VERTEBRAL UNI R MOD SED  03/07/2017  . IR INFUSION THROMBOL ARTERIAL INITIAL (MS)  03/07/2017  . IR INTRAVSC STENT CERV CAROTID W/EMB-PROT MOD SED INCL ANGIO  03/07/2017  . IR PERCUTANEOUS ART THROMBECTOMY/INFUSION INTRACRANIAL INC DIAG ANGIO  02/16/2017  . IR PTA NON CORO-LOWER EXTREM  02/16/2017  . IR RADIOLOGIST EVAL & MGMT  04/04/2017  . IR US GUIDE VASC  ACCESS LEFT  02/16/2017  . IR US GUIDE VASC ACCESS RIGHT  02/16/2017  . KNEE ARTHROSCOPY Bilateral   . RADIOLOGY WITH ANESTHESIA N/A 02/16/2017   Procedure: RADIOLOGY WITH ANESTHESIA;  Surgeon: Julieanne Cotton, MD;  Location: [facility] OR;  Service: Radiology;  Laterality: N/A;  . RADIOLOGY WITH ANESTHESIA N/A 03/07/2017   Procedure: CAROTID STENT;  Surgeon: Julieanne Cotton, MD;  Location: [facility] OR;  Service: Radiology;  Laterality: N/A;  . ROTATOR CUFF REPAIR Right     There were no vitals filed for this visit.      Subjective Assessment - 05/28/17 1214    Subjective Patient is looking forward to getting back to work.   Currently in Pain? No/denies               ADULT SLP TREATMENT - 05/28/17 0001      General Information   Behavior/Cognition Alert;Cooperative;Pleasant mood   HPI 61 year old man with left MCA CVA 02/15/2017.  Patient received inpatient rehab services, including SLP, 02/22/2017-03/02/2017.     Treatment Provided   Treatment provided Cognitive-Linquistic     Pain Assessment   Pain Assessment No/denies pain     Cognitive-Linquistic Treatment   Treatment focused on Aphasia;Dysarthria   Skilled Treatment Clinician and patient went for a  walk to practice clear speech while moving. Patient was 85% intelligible throughout the 10 minute walk. Patient sorted two decks of cards while answering questions and having a conversation with clinician to simulate multitasking activities. Patient was 80% intelligible and answered questions with 95% accuracy during this activity. Patient completed math word problems that required him to read aloud and answer the question with 90% accuracy. Patient identified words that do not belong in a functional task about categorization with 70% accuracy. Across tasks, the patient's reading content accuracy was 75% and his intelligibility was 90%.     Assessment / Recommendations / Plan   Plan Continue with current plan of care     Progression Toward  Goals   Progression toward goals Progressing toward goals          SLP Education - 05/28/17 1215    Education provided Yes   Education Details Reading everything twice to increase reading accuracy   Person(s) Educated Patient   Methods Explanation   Comprehension Verbalized understanding            SLP Long Term Goals - 04/19/17 1231      SLP LONG TERM GOAL #1   Title Pt will improve speech intelligibility in response to moderately complex cognitive linguistic tasks by controlling rate of speech, over-articulation, and increased loudness to achieve 80% intelligibility.   Status On-going     SLP LONG TERM GOAL #2   Title Patient will read aloud sentences and multi-syllabic words, maintaining phonemic accuracy, with 80% accuracy.     Time 12   Period Weeks   Status On-going     SLP LONG TERM GOAL #3   Title Patient will demonstrate reading comprehension for paragraphs with 80% accuracy.    Time 12   Period Weeks   Status On-going     SLP LONG TERM GOAL #4   Title Patient will complete high level word finding tasks with 80% accuracy.   Time 12   Period Weeks   Status On-going          Plan - 05/28/17 1216    Clinical Impression Statement When patient puts forth the necessary effort to monitor himself, he is able to produce intelligible speech in conversation and across functional tasks. Clinician will continue to challenge patient with multitasking and conversation activities.   Speech Therapy Frequency 2x / week   Duration Other (comment)   Treatment/Interventions Patient/family education;Compensatory strategies;SLP instruction and feedback;Functional tasks;Cueing hierarchy   Potential to Achieve Goals Good   Potential Considerations Ability to learn/carryover information;Co-morbidities;Cooperation/participation level;Medical prognosis;Previous level of function;Severity of impairments;Family/community support   SLP Home Exercise Plan Daily conversation with  family members   Consulted and Agree with Plan of Care Patient      Patient will benefit from skilled therapeutic intervention in order to improve the following deficits and impairments:   Dysarthria  Aphasia    Problem List Patient Active Problem List   Diagnosis Date Noted  . Hemiparesis affecting right side as late effect of cerebrovascular accident (CVA) (HCC) 03/16/2017  . Aphasia, late effect of cerebrovascular disease 03/16/2017  . Gait disturbance, post-stroke 03/16/2017  . Dysarthria as late effect of stroke 03/16/2017  . Carotid artery stenosis with cerebral infarction (HCC) 03/07/2017  . Language impairment   . ICAO (internal carotid artery occlusion), left 02/22/2017  . Acute respiratory failure (HCC) 02/22/2017  . Obesity 02/22/2017  . Expressive aphasia 02/22/2017  . Acute ischemic left MCA stroke (HCC) 02/22/2017  . Left hemiparesis (  HCC)   . Status post total bilateral knee replacement   . Benign essential HTN   . Right-sided extracranial carotid artery stenosis   . Dysphagia, post-stroke   . Acute blood loss anemia   . Nocturia   . Newly diagnosed diabetes (HCC)   . CVA (cerebral vascular accident) (HCC) - L MCA s/p tPA and thrombectomy 02/16/2017  . Anxiety 04/30/2015  . Benign fibroma of prostate 04/30/2015  . Clinical depression 04/30/2015  . Acid reflux 04/30/2015  . HLD (hyperlipidemia) 04/30/2015  . BP (high blood pressure) 04/30/2015  . Arthritis, degenerative 04/30/2015  . 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 Arthritis, septic, knee (HCC) 09/26/2012    Clydene PughGrace Rael Tilly 05/28/2017, 12:16 PM  [facility] MAIN [facility] [address] [place], [place], [address] Phone: [phone]   Fax:  [phone]   Name: Keturah BarreJames W Lillibridge MRN: [id number] Date of Birth: [birth year]

## 2017-05-28 NOTE — Therapy (Signed)
[facility] MAIN [facility] SERVICES [address] [place], [place], [address] Phone: [phone]   Fax:  [phone]  Occupational Therapy Treatment  Patient Details  Name: Erik [name] MRN: [id number] Date of Birth: 1956/09/21 Referring Provider: Dr. Hildred Alamin  Encounter Date: 05/28/2017      OT End of Session - 05/28/17 0925    Visit Number 18   Number of Visits 24   Date for OT Re-Evaluation 06/04/17   OT Start Time 0915   OT Stop Time 1000   OT Time Calculation (min) 45 min   Activity Tolerance Patient tolerated treatment well   Behavior During Therapy [facility] for tasks assessed/performed      Past Medical History:  Diagnosis Date  . Anxiety   . Arthritis    hands  . BPH (benign prostatic hyperplasia)   . Depression   . 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 ED (erectile dysfunction)   . GERD (gastroesophageal reflux disease)   . H/O calcium pyrophosphate deposition disease (CPPD)   . HLD (hyperlipidemia)   . Hypertension   . Hypogonadism in male   . Lower urinary tract symptoms (LUTS)   . Nocturia   . Stroke [facility])    Language Impairment.  Left Hemiparesis- improved  . Testosterone overdose   . Vitamin D deficiency     Past Surgical History:  Procedure Laterality Date  . BACK SURGERY    . COLON SURGERY     Colon Resection  . COLONOSCOPY    . IR ANGIO INTRA EXTRACRAN SEL COM CAROTID INNOMINATE UNI L MOD SED  03/07/2017  . IR ANGIO INTRA EXTRACRAN SEL COM CAROTID INNOMINATE UNI R MOD SED  02/16/2017  . IR ANGIO VERTEBRAL SEL SUBCLAVIAN INNOMINATE UNI L MOD SED  03/07/2017  . IR ANGIO VERTEBRAL SEL VERTEBRAL UNI R MOD SED  03/07/2017  . IR INFUSION THROMBOL ARTERIAL INITIAL (MS)  03/07/2017  . IR INTRAVSC STENT CERV CAROTID W/EMB-PROT MOD SED INCL ANGIO  03/07/2017  . IR PERCUTANEOUS ART THROMBECTOMY/INFUSION INTRACRANIAL INC DIAG ANGIO  02/16/2017  . IR PTA NON CORO-LOWER EXTREM  02/16/2017  . IR RADIOLOGIST  EVAL & MGMT  04/04/2017  . IR US GUIDE VASC ACCESS LEFT  02/16/2017  . IR US GUIDE VASC ACCESS RIGHT  02/16/2017  . KNEE ARTHROSCOPY Bilateral   . RADIOLOGY WITH ANESTHESIA N/A 02/16/2017   Procedure: RADIOLOGY WITH ANESTHESIA;  Surgeon: Julieanne Cotton, MD;  Location: [facility] OR;  Service: Radiology;  Laterality: N/A;  . RADIOLOGY WITH ANESTHESIA N/A 03/07/2017   Procedure: CAROTID STENT;  Surgeon: Julieanne Cotton, MD;  Location: [facility] OR;  Service: Radiology;  Laterality: N/A;  . ROTATOR CUFF REPAIR Right     There were no vitals filed for this visit.      Subjective Assessment - 05/28/17 0923    Subjective  Pt. reports he has been trying to do more writing at home.   Patient is accompained by: Family member   Pertinent History Pt. is a 61 y.o. male who had a CVA on the evening of April 12th, 2018. Pt. had been to his grandson's ball game, and was preparing for bed when he felling getting into bed. According to his wife, he struggled to get up. She could tell he was having a stroke, and called EMS. Pt. was transported to [facility],, was administered TPA,. Pt. underwent a cerebral angiogram with Rt ICA stent assisted angioplasty. Pt. received Therapy services in [place].  at [facility].   Currently in Pain? No/denies      OT TREATMENT    Selfcare:  Pt. education was provided about prewriting exercises with his right hand to decrease stiffness, and prepare his right hand for writing. Pt. worked on writing in long strokes while maintaining grasp of the pen. Pt. writing while copying a 3 sentence paragraph in 14 min. Pt. worked on writing speed, and legibility.                             OT Education - 05/28/17 [id number]    Education provided Yes   Education Details prewriting exercises. Writing, pen grasp.   Person(s) Educated Patient   Methods Explanation;Demonstration   Comprehension Verbalized understanding;Returned demonstration             OT Long Term Goals -  05/03/17 1130      OT LONG TERM GOAL #1   Title Pt. will independently, and efficiently write 3 sentences with 100% legibility.   Baseline Increased time required. 90% legibility for only 2 words in large print.   Time 12   Period Weeks   Status On-going     OT LONG TERM GOAL #2   Title Pt. will independently type a paragraph efficiently.   Baseline Pt. is unable   Time 12   Period Weeks   Status On-going     OT LONG TERM GOAL #3   Title Pt. improve right grip strength by 5 pounds to be able to hold and use a wrench.   Baseline Pt. is unable   Time 12   Period Weeks   Status On-going     OT LONG TERM GOAL #4   Title Pt. will improve RUE strength, and hand function skills for independence with work related tasks.   Baseline Pt. is unable to apply toothpaste onto a toothbrush.   Time 12   Period Weeks   Status Revised     OT LONG TERM GOAL #5   Title Pt. will improve FMC by 10 sec. to be able to independently handle, and count change.   Baseline Pt. has difficulty   Time 12   Period Weeks   Status On-going     OT LONG TERM GOAL #6   Title Pt. will increase RUE strength by 2mm grades to be able to complete ADLs/IADLs.   Baseline Pt. has difficulty   Time 12   Period Weeks   Status On-going     OT LONG TERM GOAL #7   Title Pt. will demonstrate independence with visual compensatory strategies for the right 100% of the time during ADL, and IADL tasks.   Baseline Limited on the right. Pt. occ. misses obstacle on the right.   Time 12   Period Weeks   Status On-going               Plan - 05/28/17 9528    Clinical Impression Statement Pt. reports he continues to have stiffness in his RUE, and hand. Pt. education was provided about prewriting exercises, pen grasp, writing speed, and legibility. Pt. continues to benefit from OT services to improve UE Functioning for improving ADLs, IADLs, and writing.   Occupational performance deficits (Please refer to evaluation  for details): ADL's   Rehab Potential Good   OT Frequency 2x / week   OT Duration 12 weeks   OT Treatment/Interventions Self-care/ADL training;Therapeutic exercise;Therapeutic exercises;Therapeutic activities;DME and/or AE instruction;Neuromuscular education;Patient/family  education;Functional Mobility Training;Cognitive remediation/compensation;Visual/perceptual remediation/compensation;Energy conservation   Consulted and Agree with Plan of Care Patient      Patient will benefit from skilled therapeutic intervention in order to improve the following deficits and impairments:  Decreased coordination, Decreased balance, Impaired UE functional use, Decreased cognition, Impaired vision/preception, Decreased strength, Decreased knowledge of use of DME, Decreased activity tolerance, Impaired tone, Impaired flexibility, Decreased range of motion, Decreased endurance  Visit Diagnosis: Muscle weakness (generalized)  Other lack of coordination    Problem List Patient Active Problem List   Diagnosis Date Noted  . Hemiparesis affecting right side as late effect of cerebrovascular accident (CVA) (HCC) 03/16/2017  . Aphasia, late effect of cerebrovascular disease 03/16/2017  . Gait disturbance, post-stroke 03/16/2017  . Dysarthria as late effect of stroke 03/16/2017  . Carotid artery stenosis with cerebral infarction (HCC) 03/07/2017  . Language impairment   . ICAO (internal carotid artery occlusion), left 02/22/2017  . Acute respiratory failure (HCC) 02/22/2017  . Obesity 02/22/2017  . Expressive aphasia 02/22/2017  . Acute ischemic left MCA stroke (HCC) 02/22/2017  . Left hemiparesis (HCC)   . Status post total bilateral knee replacement   . Benign essential HTN   . Right-sided extracranial carotid artery stenosis   . Dysphagia, post-stroke   . Acute blood loss anemia   . Nocturia   . Newly diagnosed diabetes (HCC)   . CVA (cerebral vascular accident) (HCC) - L MCA s/p tPA and  thrombectomy 02/16/2017  . Anxiety 04/30/2015  . Benign fibroma of prostate 04/30/2015  . Clinical depression 04/30/2015  . Acid reflux 04/30/2015  . HLD (hyperlipidemia) 04/30/2015  . BP (high blood pressure) 04/30/2015  . Arthritis, degenerative 04/30/2015  . 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 Arthritis, septic, knee (HCC) 09/26/2012    Olegario MessierElaine Zuri Bradway, MS, OTR/L 05/28/2017, 9:37 AM  [place] [facility] MAIN [facility] [address] , [place], [address] Phone: [phone]   Fax:  [phone]  Name: Erik [name] MRN: [id number] Date of Birth: 12-03-1955

## 2017-05-30 ENCOUNTER — Ambulatory Visit: Payer: Managed Care, Other (non HMO) | Admitting: Speech Pathology

## 2017-05-30 ENCOUNTER — Encounter: Payer: Self-pay | Admitting: Speech Pathology

## 2017-05-30 ENCOUNTER — Ambulatory Visit: Payer: Managed Care, Other (non HMO) | Admitting: Occupational Therapy

## 2017-05-30 ENCOUNTER — Ambulatory Visit: Payer: Managed Care, Other (non HMO)

## 2017-05-30 DIAGNOSIS — R471 Dysarthria and anarthria: Secondary | ICD-10-CM

## 2017-05-30 DIAGNOSIS — R4701 Aphasia: Secondary | ICD-10-CM

## 2017-05-30 DIAGNOSIS — M6281 Muscle weakness (generalized): Secondary | ICD-10-CM | POA: Diagnosis not present

## 2017-05-30 DIAGNOSIS — R278 Other lack of coordination: Secondary | ICD-10-CM

## 2017-05-30 NOTE — Therapy (Signed)
[place] MAIN [facility] SERVICES [address] [place], [place], [address] Phone: [phone]   Fax:  [phone]  Speech Language Pathology Treatment  Patient Details  Name: Erik [name] MRN: [id number] Date of Birth: 04-Jun-1956 Referring Provider: Cathlyn Parsons   Encounter Date: 05/30/2017      End of Session - 05/30/17 1123    Visit Number 20   Number of Visits 20   Date for SLP Re-Evaluation 06/12/17   SLP Start Time 1000   SLP Stop Time  1100   SLP Time Calculation (min) 60 min   Activity Tolerance Patient tolerated treatment well      Past Medical History:  Diagnosis Date  . Anxiety   . Arthritis    hands  . BPH (benign prostatic hyperplasia)   . Depression   . Diabetes mellitus without complication ([place]) 35/5974   Type II  . ED (erectile dysfunction)   . GERD (gastroesophageal reflux disease)   . H/O calcium pyrophosphate deposition disease (CPPD)   . HLD (hyperlipidemia)   . Hypertension   . Hypogonadism in male   . Lower urinary tract symptoms (LUTS)   . Nocturia   . Stroke [facility])    Language Impairment.  Left Hemiparesis- improved  . Testosterone overdose   . Vitamin D deficiency     Past Surgical History:  Procedure Laterality Date  . BACK SURGERY    . COLON SURGERY     Colon Resection  . COLONOSCOPY    . IR ANGIO INTRA EXTRACRAN SEL COM CAROTID INNOMINATE UNI L MOD SED  03/07/2017  . IR ANGIO INTRA EXTRACRAN SEL COM CAROTID INNOMINATE UNI R MOD SED  02/16/2017  . IR ANGIO VERTEBRAL SEL SUBCLAVIAN INNOMINATE UNI L MOD SED  03/07/2017  . IR ANGIO VERTEBRAL SEL VERTEBRAL UNI R MOD SED  03/07/2017  . IR INFUSION THROMBOL ARTERIAL INITIAL (MS)  03/07/2017  . IR INTRAVSC STENT CERV CAROTID W/EMB-PROT MOD SED INCL ANGIO  03/07/2017  . IR PERCUTANEOUS ART THROMBECTOMY/INFUSION INTRACRANIAL INC DIAG ANGIO  02/16/2017  . IR PTA NON CORO-LOWER EXTREM  02/16/2017  . IR RADIOLOGIST EVAL & MGMT  04/04/2017  . IR US GUIDE VASC  ACCESS LEFT  02/16/2017  . IR US GUIDE VASC ACCESS RIGHT  02/16/2017  . KNEE ARTHROSCOPY Bilateral   . RADIOLOGY WITH ANESTHESIA N/A 02/16/2017   Procedure: RADIOLOGY WITH ANESTHESIA;  Surgeon: Luanne Bras, MD;  Location: [place];  Service: Radiology;  Laterality: N/A;  . RADIOLOGY WITH ANESTHESIA N/A 03/07/2017   Procedure: CAROTID STENT;  Surgeon: Luanne Bras, MD;  Location: [place];  Service: Radiology;  Laterality: N/A;  . ROTATOR CUFF REPAIR Right     There were no vitals filed for this visit.      Subjective Assessment - 05/30/17 1117    Subjective Patient feels he has made a lot of progress in therapy and is looking forward to going back to work.   Currently in Pain? No/denies               ADULT SLP TREATMENT - 05/30/17 0001      General Information   Behavior/Cognition Alert;Cooperative;Pleasant mood   HPI 61 year old man with left MCA CVA 02/15/2017.  Patient received inpatient rehab services, including SLP, 02/22/2017-03/02/2017.     Treatment Provided   Treatment provided Cognitive-Linquistic     Pain Assessment   Pain Assessment No/denies pain     Cognitive-Linquistic Treatment   Treatment focused on  Aphasia;Dysarthria   Skilled Treatment Clinician and patient went for a walk to practice clear speech while moving. Patient was 90% intelligible throughout the 10 minute walk. Patient sorted a deck of cards/completed mazes while answering questions and having a conversation with clinician to simulate multitasking activities. Patient was 90% intelligible during this activity. Patient read a multi-paragraph passage with 80% phonemic accuracy given clinician cues and answered comprehension questions with 80% accuracy.     Assessment / Recommendations / Plan   Plan All goals met;Discharge SLP treatment due to (comment)     Progression Toward Goals   Progression toward goals Goals met, education completed, patient discharged from SLP          SLP Education -  05/30/17 1117    Education provided Yes   Education Details Louder voice in noisy conversation settings   Person(s) Educated Patient   Methods Explanation   Comprehension Verbalized understanding            SLP Long Term Goals - 05/30/17 1125      SLP LONG TERM GOAL #1   Title Pt will improve speech intelligibility in response to moderately complex cognitive linguistic tasks by controlling rate of speech, over-articulation, and increased loudness to achieve 80% intelligibility.   Time 12   Period Weeks   Status Achieved     SLP LONG TERM GOAL #2   Title Patient will read aloud sentences and multi-syllabic words, maintaining phonemic accuracy, with 80% accuracy.     Time 12   Period Weeks   Status Achieved     SLP LONG TERM GOAL #3   Title Patient will demonstrate reading comprehension for paragraphs with 80% accuracy.    Time 12   Period Weeks   Status Achieved     SLP LONG TERM GOAL #4   Title Patient will complete high level word finding tasks with 80% accuracy.   Time 12   Period Weeks   Status Achieved          Plan - 05/30/17 1124    Clinical Impression Statement Patient has demonstrated that he is able to self-monitor and self-correct when he is speaking too fast or unclearly. Patient has completed increasingly complex cognitive tasks, including categorizations, synonym and sentence generation, math word problems, and reasoning and problem solving, while maintaining functional intelligibility across tasks. Clinician has also incorporated multitasking functional exercises, such as sorting cards and completing mazes while engaging in conversation, and patient has maintained functional intelligibility in those tasks as well. Patient is intelligible while walking and talking. Patient's reading phonemic accuracy and comprehension are his weakest areas, but he is able to use context clues and re-reading strategies to understand information at the paragraph level. Patient  successfully uses a slower rate of speech, louder speaking voice, and clearer articulation with minimal cueing from the clinician and will be functionally intelligible in real-world situations.   Speech Therapy Frequency 2x / week   Duration Other (comment)   Treatment/Interventions Patient/family education;Compensatory strategies;SLP instruction and feedback;Functional tasks;Cueing hierarchy   Potential to Achieve Goals Good   Potential Considerations Ability to learn/carryover information;Co-morbidities;Cooperation/participation level;Medical prognosis;Previous level of function;Severity of impairments;Family/community support   SLP Home Exercise Plan Daily conversation with family members   Consulted and Agree with Plan of Care Patient      Patient will benefit from skilled therapeutic intervention in order to improve the following deficits and impairments:   Dysarthria  Aphasia    Problem List Patient Active Problem List  Diagnosis Date Noted  . Hemiparesis affecting right side as late effect of cerebrovascular accident (CVA) ([place]) 03/16/2017  . Aphasia, late effect of cerebrovascular disease 03/16/2017  . Gait disturbance, post-stroke 03/16/2017  . Dysarthria as late effect of stroke 03/16/2017  . Carotid artery stenosis with cerebral infarction ([place]) 03/07/2017  . Language impairment   . ICAO (internal carotid artery occlusion), left 02/22/2017  . Acute respiratory failure ([place]) 02/22/2017  . Obesity 02/22/2017  . Expressive aphasia 02/22/2017  . Acute ischemic left MCA stroke ([place]) 02/22/2017  . Left hemiparesis ([place])   . Status post total bilateral knee replacement   . Benign essential HTN   . Right-sided extracranial carotid artery stenosis   . Dysphagia, post-stroke   . Acute blood loss anemia   . Nocturia   . Newly diagnosed diabetes ([place])   . CVA (cerebral vascular accident) ([place]) - L MCA s/p tPA and thrombectomy 02/16/2017  . Anxiety 04/30/2015  . Benign  fibroma of prostate 04/30/2015  . Clinical depression 04/30/2015  . Acid reflux 04/30/2015  . HLD (hyperlipidemia) 04/30/2015  . BP (high blood pressure) 04/30/2015  . Arthritis, degenerative 04/30/2015  . 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 Arthritis, septic, knee ([place]) 09/26/2012    Aline August 05/30/2017, 11:26 AM  [place] MAIN [facility] SERVICES [address] [place], [place], [address] Phone: [phone]   Fax:  [phone]   Name: Erik [name] MRN: [id number] Date of Birth: 01-23-1956

## 2017-05-30 NOTE — Therapy (Signed)
[place] [facility] [address] [place], [place], [address] Phone: [phone]   Fax:  [phone]  Occupational Therapy Treatment  Patient Details  Name: Erik [name] MRN: [id number] Date of Birth: 10-Jan-1956 Referring Provider: Dr. Hildred AlaminAngiulli  Encounter Date: 05/30/2017      OT End of Session - 05/30/17 0925    Visit Number 19   Number of Visits 24   Date for OT Re-Evaluation 06/04/17   OT Start Time 0915   OT Stop Time 1000   OT Time Calculation (min) 45 min   Activity Tolerance Patient tolerated treatment well   Behavior During Therapy [facility] for tasks assessed/performed      Past Medical History:  Diagnosis Date  . Anxiety   . Arthritis    hands  . BPH (benign prostatic hyperplasia)   . Depression   . 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 ED (erectile dysfunction)   . GERD (gastroesophageal reflux disease)   . H/O calcium pyrophosphate deposition disease (CPPD)   . HLD (hyperlipidemia)   . Hypertension   . Hypogonadism in male   . Lower urinary tract symptoms (LUTS)   . Nocturia   . Stroke [facility](HCC)    Language Impairment.  Left Hemiparesis- improved  . Testosterone overdose   . Vitamin D deficiency     Past Surgical History:  Procedure Laterality Date  . BACK SURGERY    . COLON SURGERY     Colon Resection  . COLONOSCOPY    . IR ANGIO INTRA EXTRACRAN SEL COM CAROTID INNOMINATE UNI L MOD SED  03/07/2017  . IR ANGIO INTRA EXTRACRAN SEL COM CAROTID INNOMINATE UNI R MOD SED  02/16/2017  . IR ANGIO VERTEBRAL SEL SUBCLAVIAN INNOMINATE UNI L MOD SED  03/07/2017  . IR ANGIO VERTEBRAL SEL VERTEBRAL UNI R MOD SED  03/07/2017  . IR INFUSION THROMBOL ARTERIAL INITIAL (MS)  03/07/2017  . IR INTRAVSC STENT CERV CAROTID W/EMB-PROT MOD SED INCL ANGIO  03/07/2017  . IR PERCUTANEOUS ART THROMBECTOMY/INFUSION INTRACRANIAL INC DIAG ANGIO  02/16/2017  . IR PTA NON CORO-LOWER EXTREM  02/16/2017  . IR RADIOLOGIST  EVAL & MGMT  04/04/2017  . IR US GUIDE VASC ACCESS LEFT  02/16/2017  . IR US GUIDE VASC ACCESS RIGHT  02/16/2017  . KNEE ARTHROSCOPY Bilateral   . RADIOLOGY WITH ANESTHESIA N/A 02/16/2017   Procedure: RADIOLOGY WITH ANESTHESIA;  Surgeon: Julieanne CottonSanjeev Deveshwar, MD;  Location: [facility];  Service: Radiology;  Laterality: N/A;  . RADIOLOGY WITH ANESTHESIA N/A 03/07/2017   Procedure: CAROTID STENT;  Surgeon: Julieanne CottonSanjeev Deveshwar, MD;  Location: [facility] OR;  Service: Radiology;  Laterality: N/A;  . ROTATOR CUFF REPAIR Right     There were no vitals filed for this visit.      Subjective Assessment - 05/30/17 0923    Subjective  Pt. reports he is keeping a composition book for writing.   Patient is accompained by: Family member   Pertinent History Pt. is a 61 y.o. male who had a CVA on the evening of April 12th, 2018. Pt. had been to his grandson's ball game, and was preparing for bed when he felling getting into bed. According to his wife, he struggled to get up. She could tell he was having a stroke, and called EMS. [facility] was administered TPA,. Pt. underwent a cerebral angiogram with Rt ICA stent assisted angioplasty. [facility]   Patient Stated Goals To regain the use of his right hand   Currently in Pain? No/denies      OT TREATMENT    Selfcare:  Pt. worked on Diplomatic Services operational officer, and prewriting exercises. Pt. worked on Diplomatic Services operational officer in small controlled spaces using calendars, and attempting to fit multiple events into each daily space. Pt. Requires multiple rest breaks secondary to stiffness.                            OT Education - 05/30/17 [phone]    Education provided Yes   Education Details right [facility] with writing.   Person(s) Educated Patient   Methods Explanation   Comprehension Verbalized understanding             OT Long Term Goals - 05/03/17 1130      OT LONG TERM GOAL #1   Title Pt. will independently, and  efficiently write 3 sentences with 100% legibility.   Baseline Increased time required. 90% legibility for only 2 words in large print.   Time 12   Period Weeks   Status On-going     OT LONG TERM GOAL #2   Title Pt. will independently type a paragraph efficiently.   Baseline Pt. is unable   Time 12   Period Weeks   Status On-going     OT LONG TERM GOAL #3   Title Pt. improve right grip strength by 5 pounds to be able to hold and use a wrench.   Baseline Pt. is unable   Time 12   Period Weeks   Status On-going     OT LONG TERM GOAL #4   Title Pt. will improve RUE strength, and hand function skills for independence with work related tasks.   Baseline Pt. is unable to apply toothpaste onto a toothbrush.   Time 12   Period Weeks   Status Revised     OT LONG TERM GOAL #5   Title Pt. will improve FMC by 10 sec. to be able to independently handle, and count change.   Baseline Pt. has difficulty   Time 12   Period Weeks   Status On-going     OT LONG TERM GOAL #6   Title Pt. will increase RUE strength by 2mm grades to be able to complete ADLs/IADLs.   Baseline Pt. has difficulty   Time 12   Period Weeks   Status On-going     OT LONG TERM GOAL #7   Title Pt. will demonstrate independence with visual compensatory strategies for the right 100% of the time during ADL, and IADL tasks.   Baseline Limited on the right. Pt. occ. misses obstacle on the right.   Time 12   Period Weeks   Status On-going               Plan - 05/30/17 0929    Clinical Impression Statement Pt. continues to work on improving right hand coordination skills, writing legibility, and speed. Pt. continues to present right hand stiffness with writing. Pt. conitnues to require cues to decrease right hand stiffness with wriitn. Pt. requires verbal cues, and visual demonstration for prewriting exercises.   Occupational performance deficits (Please refer to evaluation for details): ADL's   Rehab Potential  Good   OT Frequency 2x / week   OT Duration 12 weeks   OT Treatment/Interventions Self-care/ADL training;Therapeutic exercise;Therapeutic exercises;Therapeutic activities;DME and/or AE instruction;Neuromuscular education;Patient/family education;Functional Mobility Training;Cognitive remediation/compensation;Visual/perceptual remediation/compensation;Energy conservation  Consulted and Agree with Plan of Care Patient   Family Member Consulted Wife      Patient will benefit from skilled therapeutic intervention in order to improve the following deficits and impairments:     Visit Diagnosis: Muscle weakness (generalized)  Other lack of coordination    Problem List Patient Active Problem List   Diagnosis Date Noted  . Hemiparesis affecting right side as late effect of cerebrovascular accident (CVA) (H[facility]) 03/16/2017  . Aphasia, late effect of cerebrovascular disease 03/16/2017  . Gait disturbance, post-stroke 03/16/2017  . Dysarthria as late effect of stroke 03/16/2017  . Carotid artery stenosis with cerebral infarction ([facility]) 03/07/2017  . Language impairment   . ICAO (internal carotid artery occlusion), left 02/22/2017  . Acute respiratory failure (HCC) 02/22/2017  . Obesity 02/22/2017  . Expressive aphasia 02/22/2017  . Acute ischemic left MCA stroke ([facility]) 02/22/2017  . Left hemiparesis (H[facility])   . Status post total bilateral knee replacement   . Benign essential HTN   . Right-sided extracranial carotid artery stenosis   . Dysphagia, post-stroke   . Acute blood loss anemia   . Nocturia   . Newly diagnosed diabetes (HCC)   . CVA (cerebral vascular accident) ([facility]) - L MCA s/p tPA and thrombectomy 02/16/2017  . Anxiety 04/30/2015  . Benign fibroma of prostate 04/30/2015  . Clinical depression 04/30/2015  . Acid reflux 04/30/2015  . HLD (hyperlipidemia) 04/30/2015  . BP (high blood pressure) 04/30/2015  . Arthritis, degenerative 04/30/2015  . BPH with obstruction/lower urinary  tract symptoms 04/30/2015  . Arthritis, septic, knee (HCC) 09/26/2012    Olegario MessierElaine Miking Usrey, MS, OTR/L 05/30/2017, 9:40 AM  [place] [facility] [address] [place], [place], [address] Phone: [phone]   Fax:  [phone]  Name: Erik BarreJames W Gockel MRN: [id number] Date of Birth: 1956-01-20

## 2017-05-31 ENCOUNTER — Encounter: Payer: Managed Care, Other (non HMO) | Attending: Physical Medicine & Rehabilitation

## 2017-05-31 ENCOUNTER — Encounter: Payer: Self-pay | Admitting: Physical Medicine & Rehabilitation

## 2017-05-31 ENCOUNTER — Ambulatory Visit (HOSPITAL_BASED_OUTPATIENT_CLINIC_OR_DEPARTMENT_OTHER): Payer: Managed Care, Other (non HMO) | Admitting: Physical Medicine & Rehabilitation

## 2017-05-31 VITALS — BP 123/77 | HR 58

## 2017-05-31 DIAGNOSIS — R269 Unspecified abnormalities of gait and mobility: Secondary | ICD-10-CM | POA: Diagnosis not present

## 2017-05-31 DIAGNOSIS — E785 Hyperlipidemia, unspecified: Secondary | ICD-10-CM | POA: Insufficient documentation

## 2017-05-31 DIAGNOSIS — I69351 Hemiplegia and hemiparesis following cerebral infarction affecting right dominant side: Secondary | ICD-10-CM

## 2017-05-31 DIAGNOSIS — F419 Anxiety disorder, unspecified: Secondary | ICD-10-CM | POA: Diagnosis not present

## 2017-05-31 DIAGNOSIS — I69320 Aphasia following cerebral infarction: Secondary | ICD-10-CM | POA: Diagnosis present

## 2017-05-31 DIAGNOSIS — I10 Essential (primary) hypertension: Secondary | ICD-10-CM | POA: Insufficient documentation

## 2017-05-31 DIAGNOSIS — E119 Type 2 diabetes mellitus without complications: Secondary | ICD-10-CM | POA: Diagnosis not present

## 2017-05-31 DIAGNOSIS — I69322 Dysarthria following cerebral infarction: Secondary | ICD-10-CM | POA: Diagnosis not present

## 2017-05-31 NOTE — Progress Notes (Signed)
Subjective:    Patient ID: Erik BarreJames W Meditz, male    DOB: [birth year], 61 y.o.   MRN: [id number] 61 y.o.malewith history of BPH, OA s/p B-TKR , HTN (wife/patient deny) who was admitted on 02/16/17 after being found by wife lying on the ground with inability to speak. CTA head/neck revealed hyperdense L-MCA sign likely indicating acute thrombus. He received tPA and he underwent cerebral angiogram with L-MCA thrombectomy, dense calcified plaque L-ICA precluded stent placement and noted to have 73% stenosis R-ICA due to advanced atherosclerotic changes. MRI/MRA brain reviewed, showing multiple areas of infarct. Carotid dopplers with right severe mixed plaque origin and proximal ICA/ECA 80-99%. He was started on DAPT for thromboembolic stroke due to large vessel disease and to plan for elective surgery post-rehab. Patient with resultant facial weakness with mild dysarthria, aphasia, mild left gaze preference and right sided weakness affecting mobility and ability to carry out ADL tasks Admit date: 02/22/2017 Discharge date:03/02/2017 HPI  Elective right carotid artery stenting 03-2017  Dr. Corliss Skainseveshwar on aspirin and Flonnie OvermanBrillinta  Last physical medicine and rehabilitation visit on 04/27/2017. At that time, patient was modified independent with all self-care and mobility. He was oked for driving. 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 Interval history. Neurology evaluation Recommended return to work 06/18/2017 8 hours per day 5 days per week for 2 weeks and then increase to 10 hours a day 4 days per week thereafter.  Still has outpatient OT PT stopped   7/18 to concentrate visits on OT SLP   Drove 4 hours to beach Carried items to campsite Has worked on his deck cleaning it to prep it for staining Pain Inventory Average Pain 0 Pain Right Now 0 My pain is na  In the last 24 hours, has pain interfered with the following? General  activity 0 Relation with others 0 Enjoyment of life 0 What TIME of day is your pain at its worst? na Sleep (in general) Good  Pain is worse with: na Pain improves with: na Relief from Meds: na  Mobility walk without assistance ability to climb steps?  yes do you drive?  yes  Function employed # of hrs/week short term leave  Neuro/Psych No problems in this area  Prior Studies Any changes since last visit?  no  Physicians involved in your care Any changes since last visit?  no   Family History  Problem Relation Age of Onset  . Diabetes Mother   . Hypertension Mother   . Dementia Mother   . Malig Hypertension Mother   . Hyperlipidemia Mother   . Heart Problems Mother   . Heart Problems Brother    Social History   Social History  . Marital status: Married    Spouse name: N/A  . Number of children: N/A  . Years of education: N/A   Social History Main Topics  . Smoking status: Never Smoker  . Smokeless tobacco: Never Used  . Alcohol use 4.8 oz/week    6 Shots of liquor, 2 Glasses of wine per week     Comment: drinks every day vodka with water  . Drug use: No  . Sexual activity: Yes   Other Topics Concern  . Not on file   Social History Narrative  . No narrative on file   Past Surgical History:  Procedure Laterality Date  . BACK SURGERY    . COLON SURGERY     Colon Resection  . COLONOSCOPY    .  IR ANGIO INTRA EXTRACRAN SEL COM CAROTID INNOMINATE UNI L MOD SED  03/07/2017  . IR ANGIO INTRA EXTRACRAN SEL COM CAROTID INNOMINATE UNI R MOD SED  02/16/2017  . IR ANGIO VERTEBRAL SEL SUBCLAVIAN INNOMINATE UNI L MOD SED  03/07/2017  . IR ANGIO VERTEBRAL SEL VERTEBRAL UNI R MOD SED  03/07/2017  . IR INFUSION THROMBOL ARTERIAL INITIAL (MS)  03/07/2017  . IR INTRAVSC STENT CERV CAROTID W/EMB-PROT MOD SED INCL ANGIO  03/07/2017  . IR PERCUTANEOUS ART THROMBECTOMY/INFUSION INTRACRANIAL INC DIAG ANGIO  02/16/2017  . IR PTA NON CORO-LOWER EXTREM  02/16/2017  . IR RADIOLOGIST  EVAL & MGMT  04/04/2017  . IR US GUIDE VASC ACCESS LEFT  02/16/2017  . IR US GUIDE VASC ACCESS RIGHT  02/16/2017  . KNEE ARTHROSCOPY Bilateral   . RADIOLOGY WITH ANESTHESIA N/A 02/16/2017   Procedure: RADIOLOGY WITH ANESTHESIA;  Surgeon: Julieanne CottonSanjeev Deveshwar, MD;  Location: [facility] OR;  Service: Radiology;  Laterality: N/A;  . RADIOLOGY WITH ANESTHESIA N/A 03/07/2017   Procedure: CAROTID STENT;  Surgeon: Julieanne CottonSanjeev Deveshwar, MD;  Location: [facility] OR;  Service: Radiology;  Laterality: N/A;  . ROTATOR CUFF REPAIR Right    Past Medical History:  Diagnosis Date  . Anxiety   . Arthritis    hands  . BPH (benign prostatic hyperplasia)   . Depression   . 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 ED (erectile dysfunction)   . GERD (gastroesophageal reflux disease)   . H/O calcium pyrophosphate deposition disease (CPPD)   . HLD (hyperlipidemia)   . Hypertension   . Hypogonadism in male   . Lower urinary tract symptoms (LUTS)   . Nocturia   . Stroke [facility](HCC)    Language Impairment.  Left Hemiparesis- improved  . Testosterone overdose   . Vitamin D deficiency    There were no vitals taken for this visit.  Opioid Risk Score:   Fall Risk Score:  `1  Depression screen PHQ 2/9  Depression screen PHQ 2/9 04/27/2017  Decreased Interest 0  Down, Depressed, Hopeless 0  PHQ - 2 Score 0     Review of Systems  Constitutional: Negative.   HENT: Negative.   Eyes: Negative.   Respiratory: Negative.   Cardiovascular: Negative.   Gastrointestinal: Negative.   Endocrine: Negative.   Genitourinary: Negative.   Musculoskeletal: Negative.   Skin: Negative.   Allergic/Immunologic: Negative.   Neurological: Negative.   Hematological: Negative.   Psychiatric/Behavioral: Negative.   All other systems reviewed and are negative.      Objective:   Physical Exam  Constitutional: He is oriented to person, place, and time. He appears well-developed and well-nourished.  HENT:  Head:  Normocephalic and atraumatic.  Eyes: Pupils are equal, round, and reactive to light. Conjunctivae are normal.  Neurological: He is alert and oriented to person, place, and time.  Psychiatric: He has a normal mood and affect.  Nursing note and vitals reviewed.  Negative Romberg. Positive sharpened Romberg  Gait without evidence of toe drag or knee instability, unable to do tandem gait. No assistive device needed  Grip strength 70 pounds right, 90 pounds, left  Motor strength is 5/5 in the left deltoid, bicep, tricep 4/5 in the right deltoid, bicep, tricep 5/5 bilateral hip flexor and extensor, ankle dorsal flexor. Sensation intact. Speech 95% intelligibility. No cues needed for rate     Assessment & Plan:  1.  Right hemiparesis due to left MCA distribution infarct, Residual mild dysarthria, speech intelligibility  is good as long as he slows down Excellent recovery, modified independent and doing work around the house. Ambulating distance greater than 2000 feet. Still with some higher level balance problems that can be compensated by increasing base of support. Mild right grip strength deficit. Reviewed work requirements. Reviewed neurology recommendations. Agree that patient can go back to work on 06/18/2017. Patient will follow-up in clinic in 6 weeks. That will be approximately 1 week after returned to his usual 10 hour per day. 4. Daypro week schedule. Continue outpatient OT and speech until work starts  Over half of the 25 min visit was spent counseling and coordinating care.

## 2017-05-31 NOTE — Patient Instructions (Signed)
Agree with instructions from Dr Pearlean BrownieSethi for return to work  Expect fatigue for the first month

## 2017-06-01 DIAGNOSIS — Z0289 Encounter for other administrative examinations: Secondary | ICD-10-CM

## 2017-06-04 ENCOUNTER — Ambulatory Visit: Payer: Managed Care, Other (non HMO) | Admitting: Occupational Therapy

## 2017-06-04 ENCOUNTER — Ambulatory Visit: Payer: Managed Care, Other (non HMO)

## 2017-06-04 ENCOUNTER — Ambulatory Visit: Payer: Managed Care, Other (non HMO) | Admitting: Speech Pathology

## 2017-06-04 DIAGNOSIS — R278 Other lack of coordination: Secondary | ICD-10-CM

## 2017-06-04 DIAGNOSIS — M6281 Muscle weakness (generalized): Secondary | ICD-10-CM | POA: Diagnosis not present

## 2017-06-04 NOTE — Therapy (Addendum)
[place] MAIN [facility] SERVICES [address] [place], [place], [address] Phone: [phone]   Fax:  [phone]  Occupational Therapy Treatment/Recertification Note  Patient Details  Name: Erik [name] MRN: [id number] Date of Birth: October 03, 1956 Referring Provider: Dr. Donia Guiles  Encounter Date: 06/04/2017      OT End of Session - 06/04/17 1035    Visit Number 20   Number of Visits 32   Date for OT Re-Evaluation 07/05/17   OT Start Time 0915   OT Stop Time 1003   OT Time Calculation (min) 48 min   Activity Tolerance Patient tolerated treatment well   Behavior During Therapy [facility] for tasks assessed/performed      Past Medical History:  Diagnosis Date  . Anxiety   . Arthritis    hands  . BPH (benign prostatic hyperplasia)   . Depression   . Diabetes mellitus without complication ([place]) 53/6468   Type II  . ED (erectile dysfunction)   . GERD (gastroesophageal reflux disease)   . H/O calcium pyrophosphate deposition disease (CPPD)   . HLD (hyperlipidemia)   . Hypertension   . Hypogonadism in male   . Lower urinary tract symptoms (LUTS)   . Nocturia   . Stroke [facility])    Language Impairment.  Left Hemiparesis- improved  . Testosterone overdose   . Vitamin D deficiency     Past Surgical History:  Procedure Laterality Date  . BACK SURGERY    . COLON SURGERY     Colon Resection  . COLONOSCOPY    . IR ANGIO INTRA EXTRACRAN SEL COM CAROTID INNOMINATE UNI L MOD SED  03/07/2017  . IR ANGIO INTRA EXTRACRAN SEL COM CAROTID INNOMINATE UNI R MOD SED  02/16/2017  . IR ANGIO VERTEBRAL SEL SUBCLAVIAN INNOMINATE UNI L MOD SED  03/07/2017  . IR ANGIO VERTEBRAL SEL VERTEBRAL UNI R MOD SED  03/07/2017  . IR INFUSION THROMBOL ARTERIAL INITIAL (MS)  03/07/2017  . IR INTRAVSC STENT CERV CAROTID W/EMB-PROT MOD SED INCL ANGIO  03/07/2017  . IR PERCUTANEOUS ART THROMBECTOMY/INFUSION INTRACRANIAL INC DIAG ANGIO  02/16/2017  . IR PTA NON CORO-LOWER EXTREM   02/16/2017  . IR RADIOLOGIST EVAL & MGMT  04/04/2017  . IR US GUIDE VASC ACCESS LEFT  02/16/2017  . IR US GUIDE VASC ACCESS RIGHT  02/16/2017  . KNEE ARTHROSCOPY Bilateral   . RADIOLOGY WITH ANESTHESIA N/A 02/16/2017   Procedure: RADIOLOGY WITH ANESTHESIA;  Surgeon: Luanne Bras, MD;  Location: [place];  Service: Radiology;  Laterality: N/A;  . RADIOLOGY WITH ANESTHESIA N/A 03/07/2017   Procedure: CAROTID STENT;  Surgeon: Luanne Bras, MD;  Location: [place];  Service: Radiology;  Laterality: N/A;  . ROTATOR CUFF REPAIR Right     There were no vitals filed for this visit.      Subjective Assessment - 06/04/17 1033    Subjective  Pt. reports he stained his deck this past weekend, and is a bit sore.   Patient is accompained by: Family member   Pertinent History Pt. is a 61 y.o. male who had a CVA on the evening of April 12th, 2018. Pt. had been to his grandson's ball game, and was preparing for bed when he felling getting into bed. According to his wife, he struggled to get up. She could tell he was having a stroke, and called EMS. Pt. was transported to [facility],, was administered TPA,. Pt. underwent a cerebral angiogram with Rt ICA stent assisted angioplasty. Pt. received Therapy  services in CIR. at [facility].   Patient Stated Goals To regain the use of his right hand   Currently in Pain? No/denies            [facility] OT Assessment - 06/04/17 0001      Coordination   Right 9 Hole Peg Test 34 sec.     AROM   Overall AROM Comments shoulder flexion 140, shoulder abduction : 115     Strength   Overall Strength Comments RUE shoulder flexion 4+/5, abduction 4+/5,, elbow flexion, forearm supination, elbow flexion, wrist extension 5/5     Hand Function   Right Hand Grip (lbs) 75   Right Hand Lateral Pinch 16 lbs   Right Hand 3 Point Pinch 15 lbs   Left Hand Grip (lbs) 90   Left Hand Lateral Pinch 20 lbs   Left 3 point pinch 18 lbs       OT TREATMENT    Neuro muscular  re-education:  Measurements were obtained, and goals were reviewed with the pt. Pt. Worked on typing skills, word drills, and sentence drills. Pt. Typing speed is 2 wpm. Pt. Typing 67 characters in 5 min. with 17% accuracy.                       OT Education - 06/04/17 1034    Education provided Yes   Education Details typing, [facility] Ed, review of goals.   Person(s) Educated Patient   Methods Explanation   Comprehension Verbalized understanding             OT Long Term Goals - 06/04/17 0936      OT LONG TERM GOAL #1   Title Pt. will independently, and efficiently write 3 sentences with 100% legibility.   Baseline Increased time required. 90% legibility for only 2 words in large print.   Time 4   Period Weeks   Status On-going     OT LONG TERM GOAL #2   Title Pt. will independently type a paragraph efficiently.   Baseline Pt. is unable   Time 4   Period Weeks   Status On-going     OT LONG TERM GOAL #3   Title Pt. improve right grip strength by 5 pounds to be able to hold and use a wrench.   Baseline Pt. is unable   Time 12   Period Weeks   Status Achieved     OT LONG TERM GOAL #4   Title Pt. will improve RUE strength, and hand function skills for independence with work related tasks.   Baseline Pt. is unable to apply toothpaste onto a toothbrush.   Time 12   Period Weeks   Status Achieved     OT LONG TERM GOAL #5   Title Pt. will improve [place] by 10 sec. to be able to independently handle, and count change.   Baseline Pt. has difficulty   Time 4   Period Weeks   Status Partially Met     OT LONG TERM GOAL #6   Title Pt. will increase RUE strength by 50m grades to be able to complete ADLs/IADLs.   Baseline Pt. has difficulty   Time 12   Period Weeks   Status Achieved     OT LONG TERM GOAL #7   Title Pt. will demonstrate independence with visual compensatory strategies for the right 100% of the time during ADL, and IADL tasks.   Baseline Limited  on the right. Pt. occ. misses obstacle  on the right.   Time 12   Period Weeks   Status Achieved               Plan - 06/04/17 1115    Clinical Impression Statement Pt. is making progress overall with his RUE ROM, strength, and coordination skills. Pt. continues to present with RUE stiffness throughout the RUE, and hand.  Pt. continues to work on improving right UE ROM, strength, hand coordination, and writing, and typing skills. Goals were reviwed with pt.   Occupational performance deficits (Please refer to evaluation for details): ADL's   OT Frequency 2x / week   OT Duration 12 weeks   OT Treatment/Interventions Self-care/ADL training;Therapeutic exercise;Therapeutic exercises;Therapeutic activities;DME and/or AE instruction;Neuromuscular education;Patient/family education;Functional Mobility Training;Cognitive remediation/compensation;Visual/perceptual remediation/compensation;Energy conservation   Consulted and Agree with Plan of Care Patient      Patient will benefit from skilled therapeutic intervention in order to improve the following deficits and impairments:  Decreased coordination, Decreased balance, Impaired UE functional use, Decreased cognition, Impaired vision/preception, Decreased strength, Decreased knowledge of use of DME, Decreased activity tolerance, Impaired tone, Impaired flexibility, Decreased range of motion, Decreased endurance  Visit Diagnosis: Muscle weakness (generalized) - Plan: Ot plan of care cert/re-cert  Other lack of coordination - Plan: Ot plan of care cert/re-cert    Problem List Patient Active Problem List   Diagnosis Date Noted  . Hemiparesis affecting right side as late effect of cerebrovascular accident (CVA) ([place]) 03/16/2017  . Aphasia, late effect of cerebrovascular disease 03/16/2017  . Gait disturbance, post-stroke 03/16/2017  . Dysarthria as late effect of stroke 03/16/2017  . Carotid artery stenosis with cerebral infarction ([place])  03/07/2017  . Language impairment   . ICAO (internal carotid artery occlusion), left 02/22/2017  . Acute respiratory failure ([place]) 02/22/2017  . Obesity 02/22/2017  . Expressive aphasia 02/22/2017  . Acute ischemic left MCA stroke ([place]) 02/22/2017  . Left hemiparesis ([place])   . Status post total bilateral knee replacement   . Benign essential HTN   . Right-sided extracranial carotid artery stenosis   . Dysphagia, post-stroke   . Acute blood loss anemia   . Nocturia   . Newly diagnosed diabetes ([place])   . CVA (cerebral vascular accident) ([place]) - L MCA s/p tPA and thrombectomy 02/16/2017  . Anxiety 04/30/2015  . Benign fibroma of prostate 04/30/2015  . Clinical depression 04/30/2015  . Acid reflux 04/30/2015  . HLD (hyperlipidemia) 04/30/2015  . BP (high blood pressure) 04/30/2015  . Arthritis, degenerative 04/30/2015  . 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 Arthritis, septic, knee ([place]) 09/26/2012    Harrel Carina, MS, OTR/L 06/04/2017, 11:38 AM  [place] MAIN [facility] SERVICES [address] [place], [place], [address] Phone: [phone]   Fax:  [phone]  Name: KENTREL CLEVENGER MRN: [id number] Date of Birth: [birth year]

## 2017-06-05 ENCOUNTER — Ambulatory Visit (HOSPITAL_COMMUNITY)
Admission: RE | Admit: 2017-06-05 | Discharge: 2017-06-05 | Disposition: A | Discharge status: Home / Self Care | Payer: Managed Care, Other (non HMO) | Source: Ambulatory Visit | Attending: Interventional Radiology | Admitting: Interventional Radiology

## 2017-06-05 DIAGNOSIS — I6523 Occlusion and stenosis of bilateral carotid arteries: Secondary | ICD-10-CM | POA: Insufficient documentation

## 2017-06-05 DIAGNOSIS — I771 Stricture of artery: Secondary | ICD-10-CM | POA: Diagnosis not present

## 2017-06-05 LAB — VAS US CAROTID
LEFT ECA DIAS: -11 cm/s
LEFT VERTEBRAL DIAS: -4 cm/s
Left CCA dist dias: -7 cm/s
Left CCA dist sys: -103 cm/s
Left CCA prox dias: 5 cm/s
Left CCA prox sys: 67 cm/s
RCCADSYS: -179 cm/s
RCCAPDIAS: 29 cm/s
RCCAPSYS: 102 cm/s
RIGHT ECA DIAS: -9 cm/s
RIGHT VERTEBRAL DIAS: -7 cm/s

## 2017-06-05 NOTE — Progress Notes (Addendum)
VASCULAR LAB PRELIMINARY  PRELIMINARY  PRELIMINARY  PRELIMINARY  Carotid duplex completed.    Preliminary report:  Right -50% to 75% ICA stenosis. Left - Remains occluded. Bilateral - Vertebral artery flow was antegrade.  Liviya Santini, RVS 06/05/2017, 11:10 AM

## 2017-06-06 ENCOUNTER — Ambulatory Visit: Payer: Managed Care, Other (non HMO) | Admitting: Speech Pathology

## 2017-06-06 ENCOUNTER — Ambulatory Visit: Payer: Managed Care, Other (non HMO) | Attending: Physician Assistant | Admitting: Occupational Therapy

## 2017-06-06 ENCOUNTER — Ambulatory Visit: Payer: Managed Care, Other (non HMO)

## 2017-06-06 DIAGNOSIS — M6281 Muscle weakness (generalized): Secondary | ICD-10-CM

## 2017-06-06 DIAGNOSIS — R278 Other lack of coordination: Secondary | ICD-10-CM | POA: Diagnosis present

## 2017-06-06 NOTE — Therapy (Signed)
[place] MAIN [facility] SERVICES [address] [place], [place], [address] Phone: [phone]   Fax:  [phone]  Occupational Therapy Treatment  Erik [name] Details  Name: Erik [name] MRN: [id number] Date of Birth: 1956/05/06 Referring Provider: Dr. Donia Guiles  Encounter Date: 06/06/2017      OT End of Session - 06/06/17 1008    Visit Number 21   Number of Visits 32   Date for OT Re-Evaluation 07/05/17   OT Start Time 0915   OT Stop Time 1000   OT Time Calculation (min) 45 min   Activity Tolerance Erik [name] tolerated treatment well   Behavior During Therapy [facility] for tasks assessed/performed      Past Medical History:  Diagnosis Date  . Anxiety   . Arthritis    hands  . BPH (benign prostatic hyperplasia)   . Depression   . Diabetes mellitus without complication ([place]) 74/1638   Type II  . ED (erectile dysfunction)   . GERD (gastroesophageal reflux disease)   . H/O calcium pyrophosphate deposition disease (CPPD)   . HLD (hyperlipidemia)   . Hypertension   . Hypogonadism in male   . Lower urinary tract symptoms (LUTS)   . Nocturia   . Stroke [facility])    Language Impairment.  Left Hemiparesis- improved  . Testosterone overdose   . Vitamin D deficiency     Past Surgical History:  Procedure Laterality Date  . BACK SURGERY    . COLON SURGERY     Colon Resection  . COLONOSCOPY    . IR ANGIO INTRA EXTRACRAN SEL COM CAROTID INNOMINATE UNI L MOD SED  03/07/2017  . IR ANGIO INTRA EXTRACRAN SEL COM CAROTID INNOMINATE UNI R MOD SED  02/16/2017  . IR ANGIO VERTEBRAL SEL SUBCLAVIAN INNOMINATE UNI L MOD SED  03/07/2017  . IR ANGIO VERTEBRAL SEL VERTEBRAL UNI R MOD SED  03/07/2017  . IR INFUSION THROMBOL ARTERIAL INITIAL (MS)  03/07/2017  . IR INTRAVSC STENT CERV CAROTID W/EMB-PROT MOD SED INCL ANGIO  03/07/2017  . IR PERCUTANEOUS ART THROMBECTOMY/INFUSION INTRACRANIAL INC DIAG ANGIO  02/16/2017  . IR PTA NON CORO-LOWER EXTREM  02/16/2017  . IR RADIOLOGIST  EVAL & MGMT  04/04/2017  . IR US GUIDE VASC ACCESS LEFT  02/16/2017  . IR US GUIDE VASC ACCESS RIGHT  02/16/2017  . KNEE ARTHROSCOPY Bilateral   . RADIOLOGY WITH ANESTHESIA N/A 02/16/2017   Procedure: RADIOLOGY WITH ANESTHESIA;  Surgeon: Luanne Bras, MD;  Location: [place];  Service: Radiology;  Laterality: N/A;  . RADIOLOGY WITH ANESTHESIA N/A 03/07/2017   Procedure: CAROTID STENT;  Surgeon: Luanne Bras, MD;  Location: [place];  Service: Radiology;  Laterality: N/A;  . ROTATOR CUFF REPAIR Right     There were no vitals filed for this visit.      Subjective Assessment - 06/06/17 0926    Subjective  Erik [name]. reports he plans to go back to work on the Aug. 14th.   Erik [name] is accompained by: Family member   Pertinent History Erik [name]. is a 61 y.o. male who had a CVA on the evening of April 12th, 2018. Erik [name]. had been to his grandson's ball game, and was preparing for bed when he felling getting into bed. According to his wife, he struggled to get up. She could tell he was having a stroke, and called EMS. Erik [name]. was transported to [facility],, was administered TPA,. Erik [name]. underwent a cerebral angiogram with Rt ICA stent assisted angioplasty. Erik [name]. received Therapy services in  CIR. at [facility].   Erik [name] Stated Goals To regain the use of his right hand   Currently in Pain? No/denies      OT TREATMENT    Neuro muscular re-education:  Erik [name]. worked on [facility] during typing. Erik [name]. worked on typing tests, and typing sentences drills. Erik [name]. typing speed: 3wpm, 96 characters in 5 min. With 7 errors. Typing sentence drills: 8 wpm with 93% accuracy.  Therapeutic Exercise:  Erik [name]. worked on [employer] at the wall. Erik [name]. worked on green theraband ex. for shoulder flexion, and abduction. Erik [name]. Worked on the digiflex 1.5#.                               OT Education - 06/06/17 [phone]    Education provided (P)  Yes   Person(s) Educated (P)  Erik [name]   Methods (P)  Explanation   Comprehension (P)   Verbalized understanding             OT Long Term Goals - 06/04/17 0936      OT LONG TERM GOAL #1   Title Erik [name]. will independently, and efficiently write 3 sentences with 100% legibility.   Baseline Increased time required. 90% legibility for only 2 words in large print.   Time 4   Period Weeks   Status On-going     OT LONG TERM GOAL #2   Title Erik [name]. will independently type a paragraph efficiently.   Baseline Erik [name]. is unable   Time 4   Period Weeks   Status On-going     OT LONG TERM GOAL #3   Title Erik [name]. improve right grip strength by 5 pounds to be able to hold and use a wrench.   Baseline Erik [name]. is unable   Time 12   Period Weeks   Status Achieved     OT LONG TERM GOAL #4   Title Erik [name]. will improve RUE strength, and hand function skills for independence with work related tasks.   Baseline Erik [name]. is unable to apply toothpaste onto a toothbrush.   Time 12   Period Weeks   Status Achieved     OT LONG TERM GOAL #5   Title Erik [name]. will improve [place] by 10 sec. to be able to independently handle, and count change.   Baseline Erik [name]. has difficulty   Time 4   Period Weeks   Status Partially Met     OT LONG TERM GOAL #6   Title Erik [name]. will increase RUE strength by 2m grades to be able to complete ADLs/IADLs.   Baseline Erik [name]. has difficulty   Time 12   Period Weeks   Status Achieved     OT LONG TERM GOAL #7   Title Erik [name]. will demonstrate independence with visual compensatory strategies for the right 100% of the time during ADL, and IADL tasks.   Baseline Limited on the right. Erik [name]. occ. misses obstacle on the right.   Time 12   Period Weeks   Status Achieved               Plan - 06/06/17 1008    Clinical Impression Statement Erik [name]. is making progress overall with his RUE ROM, strength, and coordination. Erik [name]. continues to present with RUE weakness, and stiffness.  Erik [name]. continues to work on improving RUE strength, and coordination skills for improved ADLs, IADLs, typing, and writing tasks.    Occupational performance deficits (Please refer to evaluation for details): ADL's   Rehab  Potential Good   OT Frequency 2x / week   OT Duration 12 weeks   OT Treatment/Interventions Self-care/ADL training;Therapeutic exercise;Therapeutic exercises;Therapeutic activities;DME and/or AE instruction;Neuromuscular education;Erik [name]/family education;Functional Mobility Training;Cognitive remediation/compensation;Visual/perceptual remediation/compensation;Energy conservation   Consulted and Agree with Plan of Care Erik [name]   Family Member Consulted Wife      Erik [name] will benefit from skilled therapeutic intervention in order to improve the following deficits and impairments:  Decreased coordination, Decreased balance, Impaired UE functional use, Decreased cognition, Impaired vision/preception, Decreased strength, Decreased knowledge of use of DME, Decreased activity tolerance, Impaired tone, Impaired flexibility, Decreased range of motion, Decreased endurance  Visit Diagnosis: Muscle weakness (generalized)  Other lack of coordination    Problem List Erik [name] Active Problem List   Diagnosis Date Noted  . Hemiparesis affecting right side as late effect of cerebrovascular accident (CVA) ([place]) 03/16/2017  . Aphasia, late effect of cerebrovascular disease 03/16/2017  . Gait disturbance, post-stroke 03/16/2017  . Dysarthria as late effect of stroke 03/16/2017  . Carotid artery stenosis with cerebral infarction ([place]) 03/07/2017  . Language impairment   . ICAO (internal carotid artery occlusion), left 02/22/2017  . Acute respiratory failure ([place]) 02/22/2017  . Obesity 02/22/2017  . Expressive aphasia 02/22/2017  . Acute ischemic left MCA stroke ([place]) 02/22/2017  . Left hemiparesis ([place])   . Status post total bilateral knee replacement   . Benign essential HTN   . Right-sided extracranial carotid artery stenosis   . Dysphagia, post-stroke   . Acute blood loss anemia   . Nocturia   . Newly  diagnosed diabetes ([place])   . CVA (cerebral vascular accident) ([place]) - L MCA s/p tPA and thrombectomy 02/16/2017  . Anxiety 04/30/2015  . Benign fibroma of prostate 04/30/2015  . Clinical depression 04/30/2015  . Acid reflux 04/30/2015  . HLD (hyperlipidemia) 04/30/2015  . BP (high blood pressure) 04/30/2015  . Arthritis, degenerative 04/30/2015  . 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 Arthritis, septic, knee ([place]) 09/26/2012    Harrel Carina, MS, OTR/L 06/06/2017, 10:19 AM  [place] MAIN [facility] SERVICES [address] [place], [place], [address] Phone: [phone]   Fax:  [phone]  Name: ELDRA WORD MRN: [id number] Date of Birth: 11/11/1955

## 2017-06-11 ENCOUNTER — Ambulatory Visit: Payer: Managed Care, Other (non HMO) | Admitting: Occupational Therapy

## 2017-06-11 ENCOUNTER — Encounter: Payer: Self-pay | Admitting: Occupational Therapy

## 2017-06-11 DIAGNOSIS — R278 Other lack of coordination: Secondary | ICD-10-CM

## 2017-06-11 DIAGNOSIS — M6281 Muscle weakness (generalized): Secondary | ICD-10-CM

## 2017-06-11 NOTE — Telephone Encounter (Signed)
Patient needs a break for 5 minutes every 3 hours for the next 3 months only. He has been cleared to drive a focal lift and, commercial vehicle. These limitations can be relaxed at next follow-up visit with my nurse practitioner

## 2017-06-11 NOTE — Telephone Encounter (Signed)
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 Rn requested her to call back.

## 2017-06-11 NOTE — Telephone Encounter (Signed)
Carla with [employer] is calling regarding forms sent to them. How long of a break does the patient need and how long does he need to take breaks? 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

## 2017-06-11 NOTE — Telephone Encounter (Signed)
Dr. Pearlean BrownieSethi see phone call from patients job. The patient was cleared by you for work return. See the message from the job. Please advise me so I can call them back.

## 2017-06-11 NOTE — Therapy (Signed)
[place] MAIN [facility] SERVICES [address] [place], [place], [address] Phone: [phone]   Fax:  [phone]  Occupational Therapy Treatment  Patient Details  Name: Erik [name] MRN: [id number] Date of Birth: [birth year] Referring Provider: Dr. Donia Guiles  Encounter Date: 06/11/2017      OT End of Session - 06/11/17 1012    Visit Number 22   Number of Visits 32   Date for OT Re-Evaluation 07/05/17   OT Start Time 1000   OT Stop Time 1045   OT Time Calculation (min) 45 min   Activity Tolerance Patient tolerated treatment well   Behavior During Therapy [facility] for tasks assessed/performed      Past Medical History:  Diagnosis Date  . Anxiety   . Arthritis    hands  . BPH (benign prostatic hyperplasia)   . Depression   . Diabetes mellitus without complication ([place]) 84/5364   Type II  . ED (erectile dysfunction)   . GERD (gastroesophageal reflux disease)   . H/O calcium pyrophosphate deposition disease (CPPD)   . HLD (hyperlipidemia)   . Hypertension   . Hypogonadism in male   . Lower urinary tract symptoms (LUTS)   . Nocturia   . Stroke [facility])    Language Impairment.  Left Hemiparesis- improved  . Testosterone overdose   . Vitamin D deficiency     Past Surgical History:  Procedure Laterality Date  . BACK SURGERY    . COLON SURGERY     Colon Resection  . COLONOSCOPY    . IR ANGIO INTRA EXTRACRAN SEL COM CAROTID INNOMINATE UNI L MOD SED  03/07/2017  . IR ANGIO INTRA EXTRACRAN SEL COM CAROTID INNOMINATE UNI R MOD SED  02/16/2017  . IR ANGIO VERTEBRAL SEL SUBCLAVIAN INNOMINATE UNI L MOD SED  03/07/2017  . IR ANGIO VERTEBRAL SEL VERTEBRAL UNI R MOD SED  03/07/2017  . IR INFUSION THROMBOL ARTERIAL INITIAL (MS)  03/07/2017  . IR INTRAVSC STENT CERV CAROTID W/EMB-PROT MOD SED INCL ANGIO  03/07/2017  . IR PERCUTANEOUS ART THROMBECTOMY/INFUSION INTRACRANIAL INC DIAG ANGIO  02/16/2017  . IR PTA NON CORO-LOWER EXTREM  02/16/2017  . IR RADIOLOGIST  EVAL & MGMT  04/04/2017  . IR US GUIDE VASC ACCESS LEFT  02/16/2017  . IR US GUIDE VASC ACCESS RIGHT  02/16/2017  . KNEE ARTHROSCOPY Bilateral   . RADIOLOGY WITH ANESTHESIA N/A 02/16/2017   Procedure: RADIOLOGY WITH ANESTHESIA;  Surgeon: Luanne Bras, MD;  Location: [place];  Service: Radiology;  Laterality: N/A;  . RADIOLOGY WITH ANESTHESIA N/A 03/07/2017   Procedure: CAROTID STENT;  Surgeon: Luanne Bras, MD;  Location: [place];  Service: Radiology;  Laterality: N/A;  . ROTATOR CUFF REPAIR Right     There were no vitals filed for this visit.      Subjective Assessment - 06/11/17 1011    Subjective  Pt. reports he plans to return to work next Monday.   Patient is accompained by: Family member   Pertinent History Pt. is a 61 y.o. male who had a CVA on the evening of April 12th, 2018. Pt. had been to his grandson's ball game, and was preparing for bed when he felling getting into bed. According to his wife, he struggled to get up. She could tell he was having a stroke, and called EMS. Pt. was transported to [facility],, was administered TPA,. Pt. underwent a cerebral angiogram with Rt ICA stent assisted angioplasty. Pt. received Therapy services in CIR. at [facility].   Currently in Pain? No/denies      OT TREATMENT    Neuro muscular re-education:  Pt. performed [facility] skills training to improve speed and dexterity needed for ADL tasks and writing. Pt. demonstrated grasping 1 inch sticks,  inch cylindrical collars, and  inch flat washers on the [employer] pegboard. Pt. performed grasping each item with her 2nd digit and thumb, and storing them in the palm. Pt. presented with difficulty storing  inch objects at a time in the palmar aspect of the hand. Pt. worked on challenging bilateral alternating hand movements. Challenged in standing, and sitting. Multiple small items were dropped. Pt. worked on writing, and prewriting exercises secondary to stiffness in the right hand.   Therapeutic  Exercise:  Pt. worked on stretching right shoulder flexion at the wall. Pt. worked with a 2# dowel for shoulder flexion, and abduction.                            OT Education - 06/11/17 1012    Education provided Yes   Education Details [facility] skills in standing.   Person(s) Educated Patient   Methods Explanation   Comprehension Verbalized understanding             OT Long Term Goals - 06/04/17 0936      OT LONG TERM GOAL #1   Title Pt. will independently, and efficiently write 3 sentences with 100% legibility.   Baseline Increased time required. 90% legibility for only 2 words in large print.   Time 4   Period Weeks   Status On-going     OT LONG TERM GOAL #2   Title Pt. will independently type a paragraph efficiently.   Baseline Pt. is unable   Time 4   Period Weeks   Status On-going     OT LONG TERM GOAL #3   Title Pt. improve right grip strength by 5 pounds to be able to hold and use a wrench.   Baseline Pt. is unable   Time 12   Period Weeks   Status Achieved     OT LONG TERM GOAL #4   Title Pt. will improve RUE strength, and hand function skills for independence with work related tasks.   Baseline Pt. is unable to apply toothpaste onto a toothbrush.   Time 12   Period Weeks   Status Achieved     OT LONG TERM GOAL #5   Title Pt. will improve [place] by 10 sec. to be able to independently handle, and count change.   Baseline Pt. has difficulty   Time 4   Period Weeks   Status Partially Met     OT LONG TERM GOAL #6   Title Pt. will increase RUE strength by 19m grades to be able to complete ADLs/IADLs.   Baseline Pt. has difficulty   Time 12   Period Weeks   Status Achieved     OT LONG TERM GOAL #7   Title Pt. will demonstrate independence with visual compensatory strategies for the right 100% of the time during ADL, and IADL tasks.   Baseline Limited on the right. Pt. occ. misses obstacle on the right.   Time 12   Period Weeks    Status Achieved               Plan - 06/11/17 1013    Clinical Impression Statement Pt. continues to work on improving right [facility], and  worked on challenging it more in standing. Pt. presents with weakness, incoordination, and dropped multiple items. Pt. continues to work on improving UE strength, and coordination skills for improved ADLs, and IADLs.    Occupational performance deficits (Please refer to evaluation for details): ADL's   Rehab Potential Good   OT Frequency 2x / week   OT Duration 12 weeks   OT Treatment/Interventions Self-care/ADL training;Therapeutic exercise;Therapeutic exercises;Therapeutic activities;DME and/or AE instruction;Neuromuscular education;Patient/family education;Functional Mobility Training;Cognitive remediation/compensation;Visual/perceptual remediation/compensation;Energy conservation   Consulted and Agree with Plan of Care Patient   Family Member Consulted Wife      Patient will benefit from skilled therapeutic intervention in order to improve the following deficits and impairments:  Decreased coordination, Decreased balance, Impaired UE functional use, Decreased cognition, Impaired vision/preception, Decreased strength, Decreased knowledge of use of DME, Decreased activity tolerance, Impaired tone, Impaired flexibility, Decreased range of motion, Decreased endurance  Visit Diagnosis: Other lack of coordination  Muscle weakness (generalized)    Problem List Patient Active Problem List   Diagnosis Date Noted  . Hemiparesis affecting right side as late effect of cerebrovascular accident (CVA) ([place]) 03/16/2017  . Aphasia, late effect of cerebrovascular disease 03/16/2017  . Gait disturbance, post-stroke 03/16/2017  . Dysarthria as late effect of stroke 03/16/2017  . Carotid artery stenosis with cerebral infarction ([place]) 03/07/2017  . Language impairment   . ICAO (internal carotid artery occlusion), left 02/22/2017  . Acute respiratory  failure ([place]) 02/22/2017  . Obesity 02/22/2017  . Expressive aphasia 02/22/2017  . Acute ischemic left MCA stroke ([place]) 02/22/2017  . Left hemiparesis ([place])   . Status post total bilateral knee replacement   . Benign essential HTN   . Right-sided extracranial carotid artery stenosis   . Dysphagia, post-stroke   . Acute blood loss anemia   . Nocturia   . Newly diagnosed diabetes ([place])   . CVA (cerebral vascular accident) ([place]) - L MCA s/p tPA and thrombectomy 02/16/2017  . Anxiety 04/30/2015  . Benign fibroma of prostate 04/30/2015  . Clinical depression 04/30/2015  . Acid reflux 04/30/2015  . HLD (hyperlipidemia) 04/30/2015  . BP (high blood pressure) 04/30/2015  . Arthritis, degenerative 04/30/2015  . 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 Arthritis, septic, knee ([place]) 09/26/2012    Harrel Carina, MS, OTR/L 06/11/2017, 10:21 AM  [place] MAIN [facility] SERVICES [address] [place], [place], [address] Phone: [phone]   Fax:  [phone]  Name: Erik [name] MRN: [id number] Date of Birth: Nov 02, 1956

## 2017-06-12 ENCOUNTER — Other Ambulatory Visit (HOSPITAL_COMMUNITY): Payer: Self-pay | Admitting: Interventional Radiology

## 2017-06-12 ENCOUNTER — Telehealth (HOSPITAL_COMMUNITY): Payer: Self-pay | Admitting: Radiology

## 2017-06-12 DIAGNOSIS — I771 Stricture of artery: Secondary | ICD-10-CM

## 2017-06-12 NOTE — Telephone Encounter (Signed)
RN receive incoming call from [place] [employer]. Rn stated per Dr. 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 Also for the next 3 months he needs a break for 5 minutes every 3 hours.Also he will be 8 hours for two weeks effective 06/18/2017, and 10 hrs per day, 4 days a week ongoing. Carla verbalized understanding. This was all documented on the work form too.

## 2017-06-12 NOTE — Telephone Encounter (Signed)
Called and spoke to pt's wife about scheduling an angiogram. JM

## 2017-06-13 ENCOUNTER — Other Ambulatory Visit: Payer: Self-pay | Admitting: Radiology

## 2017-06-13 ENCOUNTER — Other Ambulatory Visit: Payer: Self-pay | Admitting: General Surgery

## 2017-06-13 ENCOUNTER — Ambulatory Visit: Payer: Managed Care, Other (non HMO) | Admitting: Occupational Therapy

## 2017-06-13 DIAGNOSIS — R278 Other lack of coordination: Secondary | ICD-10-CM

## 2017-06-13 DIAGNOSIS — M6281 Muscle weakness (generalized): Secondary | ICD-10-CM

## 2017-06-13 NOTE — Therapy (Signed)
[place] MAIN [facility] SERVICES [address] [place], [place], [address] Phone: [phone]   Fax:  [phone]  Occupational Therapy Treatment/Discharge Note  Patient Details  Name: Erik [name] MRN: [id number] Date of Birth: 1955/11/20 Referring Provider: Dr. Donia Guiles  Encounter Date: 06/13/2017      OT End of Session - 06/13/17 1038    Visit Number 23   Number of Visits 32   Date for OT Re-Evaluation 07/05/17   OT Start Time 1005   OT Stop Time 1045   OT Time Calculation (min) 40 min   Activity Tolerance Patient tolerated treatment well   Behavior During Therapy [facility] for tasks assessed/performed      Past Medical History:  Diagnosis Date  . Anxiety   . Arthritis    hands  . BPH (benign prostatic hyperplasia)   . Depression   . Diabetes mellitus without complication ([place]) 68/1275   Type II  . ED (erectile dysfunction)   . GERD (gastroesophageal reflux disease)   . H/O calcium pyrophosphate deposition disease (CPPD)   . HLD (hyperlipidemia)   . Hypertension   . Hypogonadism in male   . Lower urinary tract symptoms (LUTS)   . Nocturia   . Stroke [facility])    Language Impairment.  Left Hemiparesis- improved  . Testosterone overdose   . Vitamin D deficiency     Past Surgical History:  Procedure Laterality Date  . BACK SURGERY    . COLON SURGERY     Colon Resection  . COLONOSCOPY    . IR ANGIO INTRA EXTRACRAN SEL COM CAROTID INNOMINATE UNI L MOD SED  03/07/2017  . IR ANGIO INTRA EXTRACRAN SEL COM CAROTID INNOMINATE UNI R MOD SED  02/16/2017  . IR ANGIO VERTEBRAL SEL SUBCLAVIAN INNOMINATE UNI L MOD SED  03/07/2017  . IR ANGIO VERTEBRAL SEL VERTEBRAL UNI R MOD SED  03/07/2017  . IR INFUSION THROMBOL ARTERIAL INITIAL (MS)  03/07/2017  . IR INTRAVSC STENT CERV CAROTID W/EMB-PROT MOD SED INCL ANGIO  03/07/2017  . IR PERCUTANEOUS ART THROMBECTOMY/INFUSION INTRACRANIAL INC DIAG ANGIO  02/16/2017  . IR PTA NON CORO-LOWER EXTREM  02/16/2017  .  IR RADIOLOGIST EVAL & MGMT  04/04/2017  . IR US GUIDE VASC ACCESS LEFT  02/16/2017  . IR US GUIDE VASC ACCESS RIGHT  02/16/2017  . KNEE ARTHROSCOPY Bilateral   . RADIOLOGY WITH ANESTHESIA N/A 02/16/2017   Procedure: RADIOLOGY WITH ANESTHESIA;  Surgeon: Luanne Bras, MD;  Location: [place];  Service: Radiology;  Laterality: N/A;  . RADIOLOGY WITH ANESTHESIA N/A 03/07/2017   Procedure: CAROTID STENT;  Surgeon: Luanne Bras, MD;  Location: [place];  Service: Radiology;  Laterality: N/A;  . ROTATOR CUFF REPAIR Right     There were no vitals filed for this visit.      Subjective Assessment - 06/13/17 1036    Subjective  Pt. is returning to work on Monday.   Patient is accompained by: Family member   Pertinent History Pt. is a 61 y.o. male who had a CVA on the evening of April 12th, 2018. Pt. had been to his grandson's ball game, and was preparing for bed when he felling getting into bed. According to his wife, he struggled to get up. She could tell he was having a stroke, and called EMS. Pt. was transported to [facility],, was administered TPA,. Pt. underwent a cerebral angiogram with Rt ICA stent assisted angioplasty. Pt. received Therapy services in CIR. at [facility].  Currently in Pain? No/denies            [facility] OT Assessment - 06/13/17 1034      Coordination   Right 9 Hole Peg Test 30 sec.     AROM   Overall AROM Comments Shoulder flexion 147, abduction WFL.     Strength   Overall Strength Comments RUE strength: 5/5 overall     Hand Function   Right Hand Grip (lbs) 75   Right Hand Lateral Pinch 16 lbs   Right Hand 3 Point Pinch 15 lbs       OT TREATMENT   Selfcare:  Pt. Worked on typing speed. Typing speed: 6wpm with 78% accuracy. 161 characters in 5 min. Writing speed: pt. completed a 3 sentence paragraph in 13 min.& 23 sec.  Therapeutic Ex.:  Pt. education was provided about HEP for right shoulder stretches in flexion and abduction at the wall, and with a  1.5# dowel.                        OT Education - 06/13/17 1037    Education provided Yes   Education Details writing. typing speed   Person(s) Educated Patient   Methods Explanation   Comprehension Verbalized understanding             OT Long Term Goals - 06/13/17 1057      OT LONG TERM GOAL #1   Title Pt. will independently, and efficiently write 3 sentences with 100% legibility.   Baseline Increased time required. 90% legibility for only 2 words in large print.   Time 4   Period Weeks   Status Partially Met     OT LONG TERM GOAL #2   Title Pt. will independently type a paragraph efficiently.   Baseline Pt. is unable   Time 4   Period Weeks   Status Partially Met     OT LONG TERM GOAL #5   Title Pt. will improve [place] by 10 sec. to be able to independently handle, and count change.   Baseline Pt. has difficulty   Time 4   Period Weeks   Status Partially Met               Plan - 06/13/17 1039    Clinical Impression Statement Pt. has made progress overall with RUE strength, ROM, FMC, typing speed, and writing speed, and legibility. Pt. Is now appropriate for discharge from OT services, as he plans to return to work.   Occupational performance deficits (Please refer to evaluation for details): ADL's   Rehab Potential Good   OT Frequency 2x / week   OT Duration 12 weeks   OT Treatment/Interventions Self-care/ADL training;Therapeutic exercise;Therapeutic exercises;Therapeutic activities;DME and/or AE instruction;Neuromuscular education;Patient/family education;Functional Mobility Training;Cognitive remediation/compensation;Visual/perceptual remediation/compensation;Energy conservation   Consulted and Agree with Plan of Care Patient      Patient will benefit from skilled therapeutic intervention in order to improve the following deficits and impairments:  Decreased coordination, Decreased balance, Impaired UE functional use, Decreased cognition,  Impaired vision/preception, Decreased strength, Decreased knowledge of use of DME, Decreased activity tolerance, Impaired tone, Impaired flexibility, Decreased range of motion, Decreased endurance  Visit Diagnosis: Muscle weakness (generalized)  Other lack of coordination    Problem List Patient Active Problem List   Diagnosis Date Noted  . Hemiparesis affecting right side as late effect of cerebrovascular accident (CVA) ([place]) 03/16/2017  . Aphasia, late effect of cerebrovascular disease 03/16/2017  . Gait disturbance, post-stroke  03/16/2017  . Dysarthria as late effect of stroke 03/16/2017  . Carotid artery stenosis with cerebral infarction ([place]) 03/07/2017  . Language impairment   . ICAO (internal carotid artery occlusion), left 02/22/2017  . Acute respiratory failure ([place]) 02/22/2017  . Obesity 02/22/2017  . Expressive aphasia 02/22/2017  . Acute ischemic left MCA stroke ([place]) 02/22/2017  . Left hemiparesis ([place])   . Status post total bilateral knee replacement   . Benign essential HTN   . Right-sided extracranial carotid artery stenosis   . Dysphagia, post-stroke   . Acute blood loss anemia   . Nocturia   . Newly diagnosed diabetes ([place])   . CVA (cerebral vascular accident) ([place]) - L MCA s/p tPA and thrombectomy 02/16/2017  . Anxiety 04/30/2015  . Benign fibroma of prostate 04/30/2015  . Clinical depression 04/30/2015  . Acid reflux 04/30/2015  . HLD (hyperlipidemia) 04/30/2015  . BP (high blood pressure) 04/30/2015  . Arthritis, degenerative 04/30/2015  . 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 Arthritis, septic, knee ([place]) 09/26/2012    Harrel Carina, MS, OTR/L 06/13/2017, 10:58 AM  [place] MAIN [facility] SERVICES [address] [place], [place], [address] Phone: [phone]   Fax:  [phone]  Name: Erik [name] MRN: [id number] Date of Birth: [birth year]

## 2017-06-14 ENCOUNTER — Other Ambulatory Visit (HOSPITAL_COMMUNITY): Payer: Self-pay | Admitting: Interventional Radiology

## 2017-06-14 ENCOUNTER — Ambulatory Visit (HOSPITAL_COMMUNITY)
Admission: RE | Admit: 2017-06-14 | Discharge: 2017-06-14 | Disposition: A | Discharge status: Home / Self Care | Payer: Managed Care, Other (non HMO) | Source: Ambulatory Visit | Attending: Interventional Radiology | Admitting: Interventional Radiology

## 2017-06-14 ENCOUNTER — Encounter (HOSPITAL_COMMUNITY): Payer: Self-pay

## 2017-06-14 DIAGNOSIS — M19041 Primary osteoarthritis, right hand: Secondary | ICD-10-CM | POA: Insufficient documentation

## 2017-06-14 DIAGNOSIS — I69354 Hemiplegia and hemiparesis following cerebral infarction affecting left non-dominant side: Secondary | ICD-10-CM | POA: Insufficient documentation

## 2017-06-14 DIAGNOSIS — F329 Major depressive disorder, single episode, unspecified: Secondary | ICD-10-CM | POA: Insufficient documentation

## 2017-06-14 DIAGNOSIS — F419 Anxiety disorder, unspecified: Secondary | ICD-10-CM | POA: Insufficient documentation

## 2017-06-14 DIAGNOSIS — I771 Stricture of artery: Secondary | ICD-10-CM

## 2017-06-14 DIAGNOSIS — I6523 Occlusion and stenosis of bilateral carotid arteries: Secondary | ICD-10-CM | POA: Diagnosis present

## 2017-06-14 DIAGNOSIS — I10 Essential (primary) hypertension: Secondary | ICD-10-CM | POA: Diagnosis not present

## 2017-06-14 DIAGNOSIS — I69320 Aphasia following cerebral infarction: Secondary | ICD-10-CM | POA: Insufficient documentation

## 2017-06-14 DIAGNOSIS — E559 Vitamin D deficiency, unspecified: Secondary | ICD-10-CM | POA: Insufficient documentation

## 2017-06-14 DIAGNOSIS — I6503 Occlusion and stenosis of bilateral vertebral arteries: Secondary | ICD-10-CM | POA: Insufficient documentation

## 2017-06-14 DIAGNOSIS — R351 Nocturia: Secondary | ICD-10-CM | POA: Diagnosis not present

## 2017-06-14 DIAGNOSIS — E119 Type 2 diabetes mellitus without complications: Secondary | ICD-10-CM | POA: Diagnosis not present

## 2017-06-14 DIAGNOSIS — M21379 Foot drop, unspecified foot: Secondary | ICD-10-CM | POA: Insufficient documentation

## 2017-06-14 DIAGNOSIS — K219 Gastro-esophageal reflux disease without esophagitis: Secondary | ICD-10-CM | POA: Insufficient documentation

## 2017-06-14 DIAGNOSIS — N401 Enlarged prostate with lower urinary tract symptoms: Secondary | ICD-10-CM | POA: Diagnosis not present

## 2017-06-14 DIAGNOSIS — I69392 Facial weakness following cerebral infarction: Secondary | ICD-10-CM | POA: Insufficient documentation

## 2017-06-14 DIAGNOSIS — E785 Hyperlipidemia, unspecified: Secondary | ICD-10-CM | POA: Insufficient documentation

## 2017-06-14 DIAGNOSIS — M19042 Primary osteoarthritis, left hand: Secondary | ICD-10-CM | POA: Insufficient documentation

## 2017-06-14 HISTORY — PX: IR ANGIO VERTEBRAL SEL VERTEBRAL UNI R MOD SED: IMG5368

## 2017-06-14 HISTORY — PX: IR ANGIO INTRA EXTRACRAN SEL COM CAROTID INNOMINATE BILAT MOD SED: IMG5360

## 2017-06-14 HISTORY — PX: IR ANGIO VERTEBRAL SEL SUBCLAVIAN INNOMINATE UNI L MOD SED: IMG5364

## 2017-06-14 LAB — BASIC METABOLIC PANEL
Anion gap: 7 (ref 5–15)
BUN: 17 mg/dL (ref 6–20)
CHLORIDE: 102 mmol/L (ref 101–111)
CO2: 29 mmol/L (ref 22–32)
CREATININE: 0.91 mg/dL (ref 0.61–1.24)
Calcium: 9.7 mg/dL (ref 8.9–10.3)
GFR calc Af Amer: >60 mL/min (ref >60)
GFR calc non Af Amer: >60 mL/min (ref >60)
GLUCOSE: 146 mg/dL — AB (ref 65–99)
POTASSIUM: 4.2 mmol/L (ref 3.5–5.1)
Sodium: 138 mmol/L (ref 135–145)

## 2017-06-14 LAB — APTT: aPTT: 25 seconds (ref 24–36)

## 2017-06-14 LAB — CBC
HEMATOCRIT: 39.9 % (ref 39.0–52.0)
Hemoglobin: 13.2 g/dL (ref 13.0–17.0)
MCH: 29.7 pg (ref 26.0–34.0)
MCHC: 33.1 g/dL (ref 30.0–36.0)
MCV: 89.9 fL (ref 78.0–100.0)
PLATELETS: 194 10*3/uL (ref 150–400)
RBC: 4.44 MIL/uL (ref 4.22–5.81)
RDW: 14.2 % (ref 11.5–15.5)
WBC: 5.3 10*3/uL (ref 4.0–10.5)

## 2017-06-14 LAB — GLUCOSE, CAPILLARY
GLUCOSE-CAPILLARY: 129 mg/dL — AB (ref 65–99)
Glucose-Capillary: 107 mg/dL — ABNORMAL HIGH (ref 65–99)

## 2017-06-14 LAB — PROTIME-INR
INR: 0.96
Prothrombin Time: 12.8 seconds (ref 11.4–15.2)

## 2017-06-14 LAB — POCT ACTIVATED CLOTTING TIME: Activated Clotting Time: 114 seconds

## 2017-06-14 MED ORDER — MIDAZOLAM HCL 2 MG/2ML IJ SOLN
INTRAMUSCULAR | Status: AC
  Filled 2017-06-14: qty 2

## 2017-06-14 MED ORDER — DIPHENHYDRAMINE HCL 50 MG/ML IJ SOLN
50.0000 mg | Freq: Once | INTRAMUSCULAR | Status: AC
  Administered 2017-06-14: 50 mg via INTRAVENOUS

## 2017-06-14 MED ORDER — LIDOCAINE HCL (PF) 1 % IJ SOLN
INTRAMUSCULAR | Status: AC
  Filled 2017-06-14: qty 30

## 2017-06-14 MED ORDER — HEPARIN SODIUM (PORCINE) 1000 UNIT/ML IJ SOLN
INTRAMUSCULAR | Status: AC | PRN
  Administered 2017-06-14: 500 [IU] via INTRAVENOUS
  Administered 2017-06-14: 1000 [IU] via INTRAVENOUS

## 2017-06-14 MED ORDER — SODIUM CHLORIDE 0.9 % IV SOLN: INTRAVENOUS | Status: AC

## 2017-06-14 MED ORDER — SODIUM CHLORIDE 0.9 % IV SOLN
Freq: Once | INTRAVENOUS | Status: AC
  Administered 2017-06-14: 10:00:00 via INTRAVENOUS

## 2017-06-14 MED ORDER — MIDAZOLAM HCL 2 MG/2ML IJ SOLN
INTRAMUSCULAR | Status: AC | PRN
  Administered 2017-06-14: 1 mg via INTRAVENOUS

## 2017-06-14 MED ORDER — IOPAMIDOL (ISOVUE-300) INJECTION 61%
INTRAVENOUS | Status: AC
  Administered 2017-06-14: 15 mL
  Filled 2017-06-14: qty 50

## 2017-06-14 MED ORDER — FENTANYL CITRATE (PF) 100 MCG/2ML IJ SOLN
INTRAMUSCULAR | Status: AC | PRN
  Administered 2017-06-14: 25 ug via INTRAVENOUS

## 2017-06-14 MED ORDER — IOPAMIDOL (ISOVUE-300) INJECTION 61%
INTRAVENOUS | Status: AC
  Administered 2017-06-14: 75 mL
  Filled 2017-06-14: qty 150

## 2017-06-14 MED ORDER — HEPARIN SODIUM (PORCINE) 1000 UNIT/ML IJ SOLN
INTRAMUSCULAR | Status: AC
  Filled 2017-06-14: qty 1

## 2017-06-14 MED ORDER — FENTANYL CITRATE (PF) 100 MCG/2ML IJ SOLN
INTRAMUSCULAR | Status: AC
  Filled 2017-06-14: qty 2

## 2017-06-14 MED ORDER — DIPHENHYDRAMINE HCL 50 MG/ML IJ SOLN
INTRAMUSCULAR | Status: AC
  Administered 2017-06-14: 50 mg via INTRAVENOUS
  Filled 2017-06-14: qty 1

## 2017-06-14 NOTE — Sedation Documentation (Signed)
Patient is resting comfortably. 

## 2017-06-14 NOTE — H&P (Signed)
Chief Complaint: R ICA stenosis  Supervising Physician: Luanne Bras  Patient Status: [facility] - Out-pt  HPI: Erik [name] is a 61 y.o. male with a recent history of a CVA secondary to ICA stenosis.  During his work up he was found to have a significant right ICA stenosis and was arranged for stenting.  This was done on 03/07/17.  He tolerated this procedure well.  He has been taking his Brilinta and ASA.  He is still slightly weaker on the right side with some foot drop/dragging when walking.  He still has facial droop and slurring of his speech when talking.  He recently had a carotid duplex which revealed further stenosis.  He returns today for a diagnostic angiogram to further evaluate this area to determine if further intervention is required.  Past Medical History:  Past Medical History:  Diagnosis Date  . Anxiety   . Arthritis    hands  . BPH (benign prostatic hyperplasia)   . Depression   . Diabetes mellitus without complication ([place]) 48/2707   Type II  . ED (erectile dysfunction)   . GERD (gastroesophageal reflux disease)   . H/O calcium pyrophosphate deposition disease (CPPD)   . HLD (hyperlipidemia)   . Hypertension   . Hypogonadism in male   . Lower urinary tract symptoms (LUTS)   . Nocturia   . Stroke [facility])    Language Impairment.  Left Hemiparesis- improved  . Testosterone overdose   . Vitamin D deficiency     Past Surgical History:  Past Surgical History:  Procedure Laterality Date  . BACK SURGERY    . COLON SURGERY     Colon Resection  . COLONOSCOPY    . IR ANGIO INTRA EXTRACRAN SEL COM CAROTID INNOMINATE UNI L MOD SED  03/07/2017  . IR ANGIO INTRA EXTRACRAN SEL COM CAROTID INNOMINATE UNI R MOD SED  02/16/2017  . IR ANGIO VERTEBRAL SEL SUBCLAVIAN INNOMINATE UNI L MOD SED  03/07/2017  . IR ANGIO VERTEBRAL SEL VERTEBRAL UNI R MOD SED  03/07/2017  . IR INFUSION THROMBOL ARTERIAL INITIAL (MS)  03/07/2017  . IR INTRAVSC STENT CERV CAROTID W/EMB-PROT MOD SED INCL  ANGIO  03/07/2017  . IR PERCUTANEOUS ART THROMBECTOMY/INFUSION INTRACRANIAL INC DIAG ANGIO  02/16/2017  . IR PTA NON CORO-LOWER EXTREM  02/16/2017  . IR RADIOLOGIST EVAL & MGMT  04/04/2017  . IR US GUIDE VASC ACCESS LEFT  02/16/2017  . IR US GUIDE VASC ACCESS RIGHT  02/16/2017  . KNEE ARTHROSCOPY Bilateral   . RADIOLOGY WITH ANESTHESIA N/A 02/16/2017   Procedure: RADIOLOGY WITH ANESTHESIA;  Surgeon: Luanne Bras, MD;  Location: [place];  Service: Radiology;  Laterality: N/A;  . RADIOLOGY WITH ANESTHESIA N/A 03/07/2017   Procedure: CAROTID STENT;  Surgeon: Luanne Bras, MD;  Location: [place];  Service: Radiology;  Laterality: N/A;  . ROTATOR CUFF REPAIR Right     Family History:  Family History  Problem Relation Age of Onset  . Diabetes Mother   . Hypertension Mother   . Dementia Mother   . [place] Hypertension Mother   . Hyperlipidemia Mother   . Heart Problems Mother   . Heart Problems Brother     Social History:  reports that he has never smoked. He has never used smokeless tobacco. He reports that he drinks about 4.8 oz of alcohol per week . He reports that he does not use drugs.  Allergies:  Allergies  Allergen Reactions  . Shellfish-Derived Products Hives  .  Celecoxib Nausea And Vomiting    Medications: Medications reviewed in epic  Please HPI for pertinent positives, otherwise complete 10 system ROS negative.  Mallampati Score: MD Evaluation Airway: WNL Heart: WNL Abdomen: WNL Chest/ Lungs: WNL ASA  Classification: 3 Mallampati/Airway Score: One  Physical Exam: BP 119/67   Pulse 62   Temp 98.4 F (36.9 C)   Ht _0  (1.778 m)   Wt 210 lb (95.3 kg)   SpO2 98%   BMI 30.13 kg/m  Body mass index is 30.13 kg/m. General: pleasant, WD, WN white male who is laying in bed in NAD HEENT: head is normocephalic, atraumatic.  Sclera are noninjected.  PERRL.  Ears and nose without any masses or lesions.  Mouth is pink and moist.  Right-sided facial droop present with  dysarthria Heart: regular, rate, and rhythm.  Normal s1,s2. No obvious murmurs, gallops, or rubs noted.  Palpable radial and pedal pulses bilaterally Lungs: CTAB, no wheezes, rhonchi, or rales noted.  Respiratory effort nonlabored Abd: soft, NT, ND, +BS, no masses, hernias, or organomegaly Psych: A&Ox3 with an appropriate affect.   Labs: Results for orders placed or performed during the hospital encounter of 06/14/17 (from the past 48 hour(s))  APTT     Status: None   Collection Time: 06/14/17 10:01 AM  Result Value Ref Range   aPTT 25 24 - 36 seconds  Basic metabolic panel     Status: Abnormal   Collection Time: 06/14/17 10:01 AM  Result Value Ref Range   Sodium 138 135 - 145 mmol/L   Potassium 4.2 3.5 - 5.1 mmol/L   Chloride 102 101 - 111 mmol/L   CO2 29 22 - 32 mmol/L   Glucose, Bld 146 (H) 65 - 99 mg/dL   BUN 17 6 - 20 mg/dL   Creatinine, Ser 0.91 0.61 - 1.24 mg/dL   Calcium 9.7 8.9 - 10.3 mg/dL   GFR calc non Af Amer >60 >60 mL/min   GFR calc Af Amer >60 >60 mL/min    Comment: (NOTE) The eGFR has been calculated using the CKD EPI equation. This calculation has not been validated in all clinical situations. eGFR's persistently <60 mL/min signify possible Chronic Kidney Disease.    Anion gap 7 5 - 15  CBC     Status: None   Collection Time: 06/14/17 10:01 AM  Result Value Ref Range   WBC 5.3 4.0 - 10.5 K/uL   RBC 4.44 4.22 - 5.81 MIL/uL   Hemoglobin 13.2 13.0 - 17.0 g/dL   HCT 39.9 39.0 - 52.0 %   MCV 89.9 78.0 - 100.0 fL   MCH 29.7 26.0 - 34.0 pg   MCHC 33.1 30.0 - 36.0 g/dL   RDW 14.2 11.5 - 15.5 %   Platelets 194 150 - 400 K/uL  Protime-INR     Status: None   Collection Time: 06/14/17 10:01 AM  Result Value Ref Range   Prothrombin Time 12.8 11.4 - 15.2 seconds   INR 0.96   Glucose, capillary     Status: Abnormal   Collection Time: 06/14/17 10:28 AM  Result Value Ref Range   Glucose-Capillary 129 (H) 65 - 99 mg/dL    Imaging: No results  found.  Assessment/Plan 1. R ICA stenosis  We will plan to move forward with a diagnostic cerebral angiogram today to further evaluate this area of narrowing.  His labs and vitals have been reviewed. Risks and benefits discussed with the patient including, but not limited to bleeding, infection, vascular  injury or contrast induced renal failure. All of the patient's questions were answered, patient is agreeable to proceed. Consent signed and in chart.  Thank you for this interesting consult.  I greatly enjoyed meeting Erik [name] and look forward to participating in their care.  A copy of this report was sent to the requesting provider on this date.  Electronically Signed: Henreitta Cea 06/14/2017, 11:19 AM   I spent a total of    25 Minutes in face to face in clinical consultation, greater than 50% of which was counseling/coordinating care for R ICA stenosis

## 2017-06-14 NOTE — Discharge Instructions (Signed)
Angiogram, Care After °This sheet gives you information about how to care for yourself after your procedure. Your doctor may also give you more specific instructions. If you have problems or questions, contact your doctor. °Follow these instructions at home: °Insertion site care °· Follow instructions from your doctor about how to take care of your long, thin tube (catheter) insertion area. Make sure you: °? Wash your hands with soap and water before you change your bandage (dressing). If you cannot use soap and water, use hand sanitizer. °? Change your bandage as told by your doctor. °? Leave stitches (sutures), skin glue, or skin tape (adhesive) strips in place. They may need to stay in place for 2 weeks or longer. If tape strips get loose and curl up, you may trim the loose edges. Do not remove tape strips completely unless your doctor says it is okay. °· Do not take baths, swim, or use a hot tub until your doctor says it is okay. °· You may shower 24-48 hours after the procedure or as told by your doctor. °? Gently wash the area with plain soap and water. °? Pat the area dry with a clean towel. °? Do not rub the area. This may cause bleeding. °· Do not apply powder or lotion to the area. Keep the area clean and dry. °· Check your insertion area every day for signs of infection. Check for: °? More redness, swelling, or pain. °? Fluid or blood. °? Warmth. °? Pus or a bad smell. °Activity °· Rest as told by your doctor, usually for 1-2 days. °· Do not lift anything that is heavier than 10 lbs. (4.5 kg) or as told by your doctor. °· Do not drive for 24 hours if you were given a medicine to help you relax (sedative). °· Do not drive or use heavy machinery while taking prescription pain medicine. °General instructions °· Go back to your normal activities as told by your doctor, usually in about a week. Ask your doctor what activities are safe for you. °· If the insertion area starts to bleed, lie flat and put pressure  on the area. If the bleeding does not stop, get help right away. This is an emergency. °· Drink enough fluid to keep your pee (urine) clear or pale yellow. °· Take over-the-counter and prescription medicines only as told by your doctor. °· Keep all follow-up visits as told by your doctor. This is important. °Contact a doctor if: °· You have a fever. °· You have chills. °· You have more redness, swelling, or pain around your insertion area. °· You have fluid or blood coming from your insertion area. °· The insertion area feels warm to the touch. °· You have pus or a bad smell coming from your insertion area. °· You have more bruising around the insertion area. °· Blood collects in the tissue around the insertion area (hematoma) that may be painful to the touch. °Get help right away if: °· You have a lot of pain in the insertion area. °· The insertion area swells very fast. °· The insertion area is bleeding, and the bleeding does not stop after holding steady pressure on the area. °· The area near or just beyond the insertion area becomes pale, cool, tingly, or numb. °These symptoms may be an emergency. Do not wait to see if the symptoms will go away. Get medical help right away. Call your local emergency services (911 in the [place] Do not drive yourself to the hospital. °Summary °·   After the procedure, it is common to have bruising and tenderness at the long, thin tube insertion area. °· After the procedure, it is important to rest and drink plenty of fluids. °· Do not take baths, swim, or use a hot tub until your doctor says it is okay to do so. You may shower 24-48 hours after the procedure or as told by your doctor. °· If the insertion area starts to bleed, lie flat and put pressure on the area. If the bleeding does not stop, get help right away. This is an emergency. °This information is not intended to replace advice given to you by your health care provider. Make sure you discuss any questions you have with  your health care provider. °Document Released: 01/19/2009 Document Revised: 10/17/2016 Document Reviewed: 10/17/2016 °Elsevier Interactive Patient Education © 2017 [employer] Inc[employer] °Moderate Conscious Sedation, Adult, Care After °These instructions provide you with information about caring for yourself after your procedure. Your health care provider may also give you more specific instructions. Your treatment has been planned according to current medical practices, but problems sometimes occur. Call your health care provider if you have any problems or questions after your procedure. °What can I expect after the procedure? °After your procedure, it is common: °· To feel sleepy for several hours. °· To feel clumsy and have poor balance for several hours. °· To have poor judgment for several hours. °· To vomit if you eat too soon. ° °Follow these instructions at home: °For at least 24 hours after the procedure: ° °· Do not: °? Participate in activities where you could fall or become injured. °? Drive. °? Use heavy machinery. °? Drink alcohol. °? Take sleeping pills or medicines that cause drowsiness. °? Make important decisions or sign legal documents. °? Take care of children on your own. °· Rest. °Eating and drinking °· Follow the diet recommended by your health care provider. °· If you vomit: °? Drink water, juice, or soup when you can drink without vomiting. °? Make sure you have little or no nausea before eating solid foods. °General instructions °· Have a responsible adult stay with you until you are awake and alert. °· Take over-the-counter and prescription medicines only as told by your health care provider. °· If you smoke, do not smoke without supervision. °· Keep all follow-up visits as told by your health care provider. This is important. °Contact a health care provider if: °· You keep feeling nauseous or you keep vomiting. °· You feel light-headed. °· You develop a rash. °· You have a fever. °Get help right  away if: °· You have trouble breathing. °This information is not intended to replace advice given to you by your health care provider. Make sure you discuss any questions you have with your health care provider. °Document Released: 08/13/2013 Document Revised: 03/27/2016 Document Reviewed: 02/12/2016 °Elsevier Interactive Patient Education © 2018 Elsevier Inc. ° °

## 2017-06-14 NOTE — Procedures (Signed)
S/P 4 vessel cerebral arteriofram. Rt CFA approach. Findings.  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 3.Occluded Lt ICA prox

## 2017-06-14 NOTE — Sedation Documentation (Signed)
Family updated as to patient's status.

## 2017-06-14 NOTE — Sedation Documentation (Signed)
IR team to place closure device to R groin

## 2017-06-14 NOTE — Sedation Documentation (Signed)
IR team holding pressure after exoseal placed

## 2017-06-14 NOTE — Sedation Documentation (Signed)
IT team continuing to hold pressure

## 2017-06-14 NOTE — Sedation Documentation (Signed)
dsg intact R groin

## 2017-06-14 NOTE — Sedation Documentation (Signed)
MD Provider at bedside. 

## 2017-06-15 ENCOUNTER — Other Ambulatory Visit: Payer: Self-pay | Admitting: Radiology

## 2017-06-15 ENCOUNTER — Other Ambulatory Visit (HOSPITAL_COMMUNITY): Payer: Self-pay | Admitting: Interventional Radiology

## 2017-06-15 ENCOUNTER — Encounter (HOSPITAL_COMMUNITY): Payer: Self-pay | Admitting: Interventional Radiology

## 2017-06-15 DIAGNOSIS — I771 Stricture of artery: Secondary | ICD-10-CM

## 2017-06-17 ENCOUNTER — Other Ambulatory Visit: Payer: Self-pay | Admitting: Radiology

## 2017-06-18 ENCOUNTER — Other Ambulatory Visit (HOSPITAL_COMMUNITY): Payer: Self-pay | Admitting: Interventional Radiology

## 2017-06-18 ENCOUNTER — Other Ambulatory Visit (HOSPITAL_COMMUNITY)
Admission: AD | Admit: 2017-06-18 | Discharge: 2017-06-18 | Disposition: A | Discharge status: Home / Self Care | Payer: Managed Care, Other (non HMO) | Source: Ambulatory Visit | Attending: Interventional Radiology | Admitting: Interventional Radiology

## 2017-06-18 ENCOUNTER — Other Ambulatory Visit (HOSPITAL_COMMUNITY): Payer: Self-pay | Admitting: Radiology

## 2017-06-18 ENCOUNTER — Telehealth (HOSPITAL_COMMUNITY): Payer: Self-pay | Admitting: Radiology

## 2017-06-18 DIAGNOSIS — I771 Stricture of artery: Secondary | ICD-10-CM | POA: Insufficient documentation

## 2017-06-18 LAB — PLATELET INHIBITION P2Y12: Platelet Function  P2Y12: 72 [PRU] — ABNORMAL LOW (ref 194–418)

## 2017-06-18 NOTE — Telephone Encounter (Signed)
Called pt's wife. Told her that [name]'s P2Y12 is good at 1572, per Deveshwar to continue as is and we will proceed with treatment scheduled for 06/20/17. The pt's wife states understanding and is in agreement with this plan of care. JM

## 2017-06-19 ENCOUNTER — Other Ambulatory Visit: Payer: Self-pay | Admitting: Radiology

## 2017-06-19 ENCOUNTER — Other Ambulatory Visit: Payer: Self-pay | Admitting: General Surgery

## 2017-06-19 ENCOUNTER — Encounter (HOSPITAL_COMMUNITY): Payer: Self-pay | Admitting: *Deleted

## 2017-06-19 NOTE — Anesthesia Preprocedure Evaluation (Addendum)
Anesthesia Evaluation  Patient identified by MRN, date of birth, ID band Patient awake    Reviewed: Allergy & Precautions, H&P , NPO status , Patient's Chart, lab work & pertinent test results  Airway Mallampati: III  TM Distance: >3 FB Neck ROM: Full    Dental no notable dental hx. (+) Teeth Intact, Dental Advisory Given   Pulmonary neg pulmonary ROS,    Pulmonary exam normal breath sounds clear to auscultation       Cardiovascular Exercise Tolerance: Good hypertension, Pt. on medications + Peripheral Vascular Disease   Rhythm:Regular Rate:Normal     Neuro/Psych Anxiety Depression CVA, Residual Symptoms negative psych ROS   GI/Hepatic Neg liver ROS, GERD  Medicated and Controlled,  Endo/Other  diabetes, Type 2, Oral Hypoglycemic Agents  Renal/GU negative Renal ROS  negative genitourinary   Musculoskeletal  (+) Arthritis , Osteoarthritis,    Abdominal   Peds  Hematology negative hematology ROS (+) anemia ,   Anesthesia Other Findings   Reproductive/Obstetrics negative OB ROS                            Anesthesia Physical Anesthesia Plan  ASA: III  Anesthesia Plan: MAC   Post-op Pain Management:    Induction: Intravenous  PONV Risk Score and Plan: 3 and Ondansetron, Dexamethasone and Midazolam  Airway Management Planned: Simple Face Mask  Additional Equipment: Arterial line  Intra-op Plan:   Post-operative Plan:   Informed Consent: I have reviewed the patients History and Physical, chart, labs and discussed the procedure including the risks, benefits and alternatives for the proposed anesthesia with the patient or authorized representative who has indicated his/her understanding and acceptance.   Dental advisory given  Plan Discussed with: CRNA  Anesthesia Plan Comments:        Anesthesia Quick Evaluation

## 2017-06-20 ENCOUNTER — Ambulatory Visit (HOSPITAL_COMMUNITY): Payer: Managed Care, Other (non HMO) | Admitting: Critical Care Medicine

## 2017-06-20 ENCOUNTER — Encounter (HOSPITAL_COMMUNITY): Payer: Self-pay

## 2017-06-20 ENCOUNTER — Observation Stay (HOSPITAL_COMMUNITY)
Admission: AD | Admit: 2017-06-20 | Discharge: 2017-06-21 | DRG: 039 | Disposition: A | Discharge status: Home / Self Care | Payer: Managed Care, Other (non HMO) | Source: Ambulatory Visit | Attending: Interventional Radiology | Admitting: Interventional Radiology

## 2017-06-20 ENCOUNTER — Ambulatory Visit (HOSPITAL_COMMUNITY)
Admission: RE | Admit: 2017-06-20 | Discharge: 2017-06-20 | Disposition: A | Discharge status: Home / Self Care | Payer: Managed Care, Other (non HMO) | Source: Ambulatory Visit | Attending: Interventional Radiology | Admitting: Interventional Radiology

## 2017-06-20 ENCOUNTER — Encounter (HOSPITAL_COMMUNITY)
Admission: AD | Disposition: A | Discharge status: Home / Self Care | Payer: Self-pay | Source: Ambulatory Visit | Attending: Interventional Radiology

## 2017-06-20 DIAGNOSIS — R29810 Facial weakness: Secondary | ICD-10-CM | POA: Diagnosis not present

## 2017-06-20 DIAGNOSIS — K219 Gastro-esophageal reflux disease without esophagitis: Secondary | ICD-10-CM | POA: Diagnosis present

## 2017-06-20 DIAGNOSIS — N400 Enlarged prostate without lower urinary tract symptoms: Secondary | ICD-10-CM | POA: Diagnosis not present

## 2017-06-20 DIAGNOSIS — E785 Hyperlipidemia, unspecified: Secondary | ICD-10-CM | POA: Diagnosis not present

## 2017-06-20 DIAGNOSIS — I6501 Occlusion and stenosis of right vertebral artery: Secondary | ICD-10-CM | POA: Diagnosis present

## 2017-06-20 DIAGNOSIS — Z8249 Family history of ischemic heart disease and other diseases of the circulatory system: Secondary | ICD-10-CM

## 2017-06-20 DIAGNOSIS — Z79899 Other long term (current) drug therapy: Secondary | ICD-10-CM

## 2017-06-20 DIAGNOSIS — M1990 Unspecified osteoarthritis, unspecified site: Secondary | ICD-10-CM | POA: Diagnosis present

## 2017-06-20 DIAGNOSIS — I10 Essential (primary) hypertension: Secondary | ICD-10-CM | POA: Diagnosis present

## 2017-06-20 DIAGNOSIS — F419 Anxiety disorder, unspecified: Secondary | ICD-10-CM | POA: Diagnosis present

## 2017-06-20 DIAGNOSIS — F329 Major depressive disorder, single episode, unspecified: Secondary | ICD-10-CM | POA: Diagnosis present

## 2017-06-20 DIAGNOSIS — G450 Vertebro-basilar artery syndrome: Secondary | ICD-10-CM | POA: Diagnosis present

## 2017-06-20 DIAGNOSIS — E119 Type 2 diabetes mellitus without complications: Secondary | ICD-10-CM | POA: Diagnosis present

## 2017-06-20 DIAGNOSIS — E559 Vitamin D deficiency, unspecified: Secondary | ICD-10-CM | POA: Diagnosis not present

## 2017-06-20 DIAGNOSIS — I771 Stricture of artery: Secondary | ICD-10-CM

## 2017-06-20 DIAGNOSIS — Z7982 Long term (current) use of aspirin: Secondary | ICD-10-CM

## 2017-06-20 HISTORY — PX: RADIOLOGY WITH ANESTHESIA: SHX6223

## 2017-06-20 HISTORY — PX: IR TRANSCATH EXCRAN VERT OR CAR A STENT: IMG1955

## 2017-06-20 LAB — COMPREHENSIVE METABOLIC PANEL
ALBUMIN: 4.2 g/dL (ref 3.5–5.0)
ALK PHOS: 26 U/L — AB (ref 38–126)
ALT: 26 U/L (ref 17–63)
ANION GAP: 10 (ref 5–15)
AST: 27 U/L (ref 15–41)
BILIRUBIN TOTAL: 0.7 mg/dL (ref 0.3–1.2)
BUN: 25 mg/dL — ABNORMAL HIGH (ref 6–20)
CALCIUM: 9.7 mg/dL (ref 8.9–10.3)
CO2: 25 mmol/L (ref 22–32)
Chloride: 102 mmol/L (ref 101–111)
Creatinine, Ser: 0.94 mg/dL (ref 0.61–1.24)
GFR calc non Af Amer: >60 mL/min (ref >60)
GLUCOSE: 112 mg/dL — AB (ref 65–99)
POTASSIUM: 3.7 mmol/L (ref 3.5–5.1)
SODIUM: 137 mmol/L (ref 135–145)
TOTAL PROTEIN: 6.9 g/dL (ref 6.5–8.1)

## 2017-06-20 LAB — CBC WITH DIFFERENTIAL/PLATELET
BASOS ABS: 0 10*3/uL (ref 0.0–0.1)
BASOS PCT: 0 %
Eosinophils Absolute: 0.2 10*3/uL (ref 0.0–0.7)
Eosinophils Relative: 4 %
HEMATOCRIT: 37.5 % — AB (ref 39.0–52.0)
HEMOGLOBIN: 12.8 g/dL — AB (ref 13.0–17.0)
Lymphocytes Relative: 29 %
Lymphs Abs: 1.6 10*3/uL (ref 0.7–4.0)
MCH: 29.8 pg (ref 26.0–34.0)
MCHC: 34.1 g/dL (ref 30.0–36.0)
MCV: 87.2 fL (ref 78.0–100.0)
Monocytes Absolute: 0.6 10*3/uL (ref 0.1–1.0)
Monocytes Relative: 11 %
NEUTROS ABS: 3 10*3/uL (ref 1.7–7.7)
NEUTROS PCT: 56 %
Platelets: 199 10*3/uL (ref 150–400)
RBC: 4.3 MIL/uL (ref 4.22–5.81)
RDW: 13.9 % (ref 11.5–15.5)
WBC: 5.4 10*3/uL (ref 4.0–10.5)

## 2017-06-20 LAB — POCT ACTIVATED CLOTTING TIME
Activated Clotting Time: 147 seconds
Activated Clotting Time: 153 seconds

## 2017-06-20 LAB — GLUCOSE, CAPILLARY
GLUCOSE-CAPILLARY: 137 mg/dL — AB (ref 65–99)
Glucose-Capillary: 127 mg/dL — ABNORMAL HIGH (ref 65–99)

## 2017-06-20 LAB — HEPARIN LEVEL (UNFRACTIONATED): Heparin Unfractionated: <0.1 IU/mL — ABNORMAL LOW (ref 0.30–0.70)

## 2017-06-20 LAB — MRSA PCR SCREENING: MRSA BY PCR: NEGATIVE

## 2017-06-20 SURGERY — RADIOLOGY WITH ANESTHESIA
Anesthesia: General

## 2017-06-20 MED ORDER — ASPIRIN 81 MG PO CHEW: 81.0000 mg | CHEWABLE_TABLET | Freq: Once | ORAL | Status: DC

## 2017-06-20 MED ORDER — ONDANSETRON HCL 4 MG/2ML IJ SOLN
INTRAMUSCULAR | Status: DC | PRN
  Administered 2017-06-20: 4 mg via INTRAVENOUS

## 2017-06-20 MED ORDER — ONDANSETRON HCL 4 MG/2ML IJ SOLN: 4.0000 mg | Freq: Four times a day (QID) | INTRAMUSCULAR | Status: DC | PRN

## 2017-06-20 MED ORDER — NITROGLYCERIN 1 MG/10 ML FOR IR/CATH LAB
INTRA_ARTERIAL | Status: AC
  Filled 2017-06-20: qty 10

## 2017-06-20 MED ORDER — HEPARIN (PORCINE) IN NACL 100-0.45 UNIT/ML-% IJ SOLN
500.0000 [IU]/h | INTRAMUSCULAR | Status: DC
  Administered 2017-06-20: 500 [IU]/h via INTRAVENOUS
  Filled 2017-06-20: qty 250

## 2017-06-20 MED ORDER — ASPIRIN 81 MG PO CHEW
CHEWABLE_TABLET | ORAL | Status: AC
  Filled 2017-06-20: qty 3

## 2017-06-20 MED ORDER — IOPAMIDOL (ISOVUE-300) INJECTION 61%
INTRAVENOUS | Status: AC
  Administered 2017-06-20: 75 mL
  Filled 2017-06-20: qty 150

## 2017-06-20 MED ORDER — ACETAMINOPHEN 650 MG RE SUPP: 650.0000 mg | RECTAL | Status: DC | PRN

## 2017-06-20 MED ORDER — SODIUM CHLORIDE 0.9 % IV SOLN
INTRAVENOUS | Status: DC
  Administered 2017-06-20: 08:00:00 via INTRAVENOUS

## 2017-06-20 MED ORDER — TICAGRELOR 90 MG PO TABS
90.0000 mg | ORAL_TABLET | Freq: Two times a day (BID) | ORAL | Status: DC
  Administered 2017-06-20 – 2017-06-21 (×2): 90 mg via ORAL
  Filled 2017-06-20 (×2): qty 1

## 2017-06-20 MED ORDER — HEPARIN SODIUM (PORCINE) 1000 UNIT/ML IJ SOLN
INTRAMUSCULAR | Status: DC | PRN
  Administered 2017-06-20: 500 [IU] via INTRAVENOUS
  Administered 2017-06-20: 1000 [IU] via INTRAVENOUS
  Administered 2017-06-20: 3000 [IU] via INTRAVENOUS
  Administered 2017-06-20: 1000 [IU] via INTRAVENOUS

## 2017-06-20 MED ORDER — IOPAMIDOL (ISOVUE-300) INJECTION 61%
INTRAVENOUS | Status: AC
  Administered 2017-06-20: 25 mL
  Filled 2017-06-20: qty 100

## 2017-06-20 MED ORDER — NIMODIPINE 30 MG PO CAPS
0.0000 mg | ORAL_CAPSULE | ORAL | Status: AC
  Administered 2017-06-20: 60 mg via ORAL
  Filled 2017-06-20 (×2): qty 1
  Filled 2017-06-20: qty 2

## 2017-06-20 MED ORDER — LIDOCAINE HCL (PF) 1 % IJ SOLN
INTRAMUSCULAR | Status: AC
  Filled 2017-06-20: qty 30

## 2017-06-20 MED ORDER — SODIUM CHLORIDE 0.9 % IV SOLN
INTRAVENOUS | Status: DC
  Administered 2017-06-20: 15:00:00 via INTRAVENOUS

## 2017-06-20 MED ORDER — LIDOCAINE HCL (PF) 1 % IJ SOLN
INTRAMUSCULAR | Status: DC | PRN
  Administered 2017-06-20: 10 mL

## 2017-06-20 MED ORDER — HYDRALAZINE HCL 20 MG/ML IJ SOLN
INTRAMUSCULAR | Status: DC | PRN
  Administered 2017-06-20 (×2): 5 mg via INTRAVENOUS

## 2017-06-20 MED ORDER — DIPHENHYDRAMINE HCL 50 MG/ML IJ SOLN
INTRAMUSCULAR | Status: DC | PRN
  Administered 2017-06-20: 25 mg via INTRAVENOUS

## 2017-06-20 MED ORDER — MIDAZOLAM HCL 5 MG/5ML IJ SOLN
INTRAMUSCULAR | Status: DC | PRN
  Administered 2017-06-20 (×4): 0.5 mg via INTRAVENOUS

## 2017-06-20 MED ORDER — ACETAMINOPHEN 325 MG PO TABS: 650.0000 mg | ORAL_TABLET | ORAL | Status: DC | PRN

## 2017-06-20 MED ORDER — ASPIRIN EC 325 MG PO TBEC
325.0000 mg | DELAYED_RELEASE_TABLET | ORAL | Status: DC
  Filled 2017-06-20: qty 1

## 2017-06-20 MED ORDER — CEFAZOLIN SODIUM-DEXTROSE 2-4 GM/100ML-% IV SOLN
2.0000 g | INTRAVENOUS | Status: AC
  Administered 2017-06-20: 2 g via INTRAVENOUS
  Filled 2017-06-20 (×2): qty 100

## 2017-06-20 MED ORDER — FENTANYL CITRATE (PF) 100 MCG/2ML IJ SOLN: 25.0000 ug | INTRAMUSCULAR | Status: DC | PRN

## 2017-06-20 MED ORDER — FENTANYL CITRATE (PF) 100 MCG/2ML IJ SOLN
INTRAMUSCULAR | Status: DC | PRN
  Administered 2017-06-20 (×8): 25 ug via INTRAVENOUS

## 2017-06-20 MED ORDER — ASPIRIN 325 MG PO TABS: 325.0000 mg | ORAL_TABLET | Freq: Every day | ORAL | Status: DC

## 2017-06-20 MED ORDER — HEPARIN (PORCINE) IN NACL 100-0.45 UNIT/ML-% IJ SOLN
INTRAMUSCULAR | Status: AC
  Filled 2017-06-20: qty 250

## 2017-06-20 MED ORDER — ASPIRIN EC 81 MG PO TBEC
81.0000 mg | DELAYED_RELEASE_TABLET | Freq: Every day | ORAL | Status: DC
  Administered 2017-06-21: 81 mg via ORAL
  Filled 2017-06-20: qty 1

## 2017-06-20 MED ORDER — LACTATED RINGERS IV SOLN
INTRAVENOUS | Status: DC | PRN
  Administered 2017-06-20: 09:00:00 via INTRAVENOUS

## 2017-06-20 MED ORDER — ASPIRIN 81 MG PO CHEW
243.0000 mg | CHEWABLE_TABLET | Freq: Once | ORAL | Status: AC
Start: 2017-06-20 — End: 2017-06-20
  Administered 2017-06-20: 243 mg via ORAL

## 2017-06-20 MED ORDER — ASPIRIN EC 325 MG PO TBEC
DELAYED_RELEASE_TABLET | ORAL | Status: AC
  Filled 2017-06-20: qty 1

## 2017-06-20 MED ORDER — CLOPIDOGREL BISULFATE 75 MG PO TABS: 75.0000 mg | ORAL_TABLET | Freq: Every day | ORAL | Status: DC

## 2017-06-20 MED ORDER — HEPARIN SODIUM (PORCINE) 1000 UNIT/ML IJ SOLN
INTRAMUSCULAR | Status: AC
  Filled 2017-06-20: qty 1

## 2017-06-20 MED ORDER — CLEVIDIPINE BUTYRATE 0.5 MG/ML IV EMUL: 0.0000 mg/h | INTRAVENOUS | Status: DC

## 2017-06-20 MED ORDER — HEPARIN (PORCINE) IN NACL 100-0.45 UNIT/ML-% IJ SOLN
700.0000 [IU]/h | INTRAMUSCULAR | Status: AC
  Administered 2017-06-20: 700 [IU]/h via INTRAVENOUS
  Filled 2017-06-20: qty 250

## 2017-06-20 MED ORDER — DIPHENHYDRAMINE HCL 50 MG/ML IJ SOLN
INTRAMUSCULAR | Status: AC
  Filled 2017-06-20: qty 1

## 2017-06-20 MED ORDER — ACETAMINOPHEN 160 MG/5ML PO SOLN: 650.0000 mg | ORAL | Status: DC | PRN

## 2017-06-20 NOTE — Anesthesia Procedure Notes (Signed)
Procedure Name: MAC Date/Time: 06/20/2017 10:00 AM Performed by: Merrilyn Puma B Pre-anesthesia Checklist: Patient identified, Emergency Drugs available, Suction available, Patient being monitored and Timeout performed Patient Re-evaluated:Patient Re-evaluated prior to induction Oxygen Delivery Method: Simple face mask Preoxygenation: Pre-oxygenation with 100% oxygen Placement Confirmation: positive ETCO2 and breath sounds checked- equal and bilateral Dental Injury: Teeth and Oropharynx as per pre-operative assessment

## 2017-06-20 NOTE — Anesthesia Postprocedure Evaluation (Addendum)
Anesthesia Post Note  Patient: Erik [name]  Procedure(s) Performed: Procedure(s) (LRB): RADIOLOGY WITH ANESTHESIA STENTING (N/A)     Patient location during evaluation: PACU Anesthesia Type: MAC Level of consciousness: awake and alert Pain management: pain level controlled Vital Signs Assessment: post-procedure vital signs reviewed and stable Respiratory status: spontaneous breathing Cardiovascular status: stable Anesthetic complications: no    Last Vitals:  Vitals:   06/20/17 1430 06/20/17 1500  BP: 124/62 115/66  Pulse: 67 62  Resp: 13 10  Temp:    SpO2: 98% 99%    Last Pain:  Vitals:   06/20/17 0738  TempSrc:   PainSc: 0-No pain                 Nolon Nations

## 2017-06-20 NOTE — Procedures (Signed)
S/P RT VA origin stent assisted angioplasty.

## 2017-06-20 NOTE — Discharge Instructions (Signed)
Cerebral Angiogram, Care After °Refer to this sheet in the next few weeks. These instructions provide you with information on caring for yourself after your procedure. Your health care provider may also give you more specific instructions. Your treatment has been planned according to current medical practices, but problems sometimes occur. Call your health care provider if you have any problems or questions after your procedure. °What can I expect after the procedure? °After your procedure, it is typical to have the following: °· Bruising at the catheter insertion site that usually fades within 1-2 weeks. °· Blood collecting in the tissue (hematoma) that may be painful to the touch. It should usually decrease in size and tenderness within 1-2 weeks. °· A mild headache. ° °Follow these instructions at home: °· Take medicines only as directed by your health care provider. °· You may shower 24-48 hours after the procedure or as directed by your health care provider. Remove the bandage (dressing) and gently wash the site with plain soap and water. Pat the area dry with a clean towel. Do not rub the site, because this may cause bleeding. °· Do not take baths, swim, or use a hot tub until your health care provider approves. °· Check your insertion site every day for redness, swelling, or drainage. °· Do not apply powder or lotion to the site. °· Do not lift over 10 lb (4.5 kg) for 5 days after your procedure or as directed by your health care provider. °· Ask your health care provider when it is okay to: °? Return to work or school. °? Resume usual physical activities or sports. °? Resume sexual activity. °· Do not drive home if you are discharged the same day as the procedure. Have someone else drive you. °· You may drive 24 hours after the procedure unless otherwise instructed by your health care provider. °· Do not operate machinery or power tools for 24 hours after the procedure or as directed by your health care  provider. °· If your procedure was done as an outpatient procedure, which means that you went home the same day as your procedure, a responsible adult should be with you for the first 24 hours after you arrive home. °· Keep all follow-up visits as directed by your health care provider. This is important. °Contact a health care provider if: °· You have a fever. °· You have chills. °· You have increased bleeding from the catheter insertion site. Hold pressure on the site. °Get help right away if: °· You have vision changes or loss of vision. °· You have numbness or weakness on one side of your body. °· You have difficulty talking, or you have slurred speech or cannot speak (aphasia). °· You feel confused or have difficulty remembering. °· You have unusual pain at the catheter insertion site. °· You have redness, warmth, or swelling at the catheter insertion site. °· You have drainage (other than a small amount of blood on the dressing) from the catheter insertion site. °· The catheter insertion site is bleeding, and the bleeding does not stop after 30 minutes of holding steady pressure on the site. °These symptoms may represent a serious problem that is an emergency. Do not wait to see if the symptoms will go away. Get medical help right away. Call your local emergency services (911 in [place] Do not drive yourself to the hospital. °This information is not intended to replace advice given to you by your health care provider. Make sure you discuss any questions   you have with your health care provider. °Document Released: 03/09/2014 Document Revised: 03/30/2016 Document Reviewed: 11/05/2013 °Elsevier Interactive Patient Education © 2017 [employer] ° °

## 2017-06-20 NOTE — Progress Notes (Signed)
Pt seen post op (R)vert art stent angioplasty He feels well, no c/o Denies HA, N/V, leg pain Wife at bedside.  BP 115/66   Pulse 62   Temp 97.7 F (36.5 C)   Resp 10   Ht 5\' 10"  (1.778 m)   Wt 210 lb (95.3 kg)   SpO2 99%   BMI 30.13 kg/m  Art line not correlating. A&O x 3 Mild right face droop present before procedure Tongue midline PERRLA, EOMI Fine motor, slight delay on right, also present prior to procedure. Strength 5/5  (L)groin soft, NT, no hematoma Feet warm, 2+ pedal pulses.  S/p (R)VA stent angioplasty Will DC aline and begin diet Resume Brilinta tonight.  Anticipate DC in am.  Brayton ElKevin Ellon Marasco PA-C Interventional Radiology 06/20/2017 4:10 PM

## 2017-06-20 NOTE — Transfer of Care (Signed)
Immediate Anesthesia Transfer of Care Note  Patient: Erik [name]  Procedure(s) Performed: Procedure(s): RADIOLOGY WITH ANESTHESIA STENTING (N/A)  Patient Location: PACU  Anesthesia Type:MAC  Level of Consciousness: awake, alert  and oriented  Airway & Oxygen Therapy: Patient Spontanous Breathing and Patient connected to face mask oxygen  Post-op Assessment: Report given to RN, Post -op Vital signs reviewed and stable, Patient moving all extremities X 4 and Patient able to stick tongue midline  Post vital signs: Reviewed and stable  Last Vitals:  Vitals:   06/20/17 0656  BP: (!) 144/73  Pulse: (!) 59  Resp: 18  Temp: 36.8 C  SpO2: 98%    Last Pain:  Vitals:   06/20/17 0738  TempSrc:   PainSc: 0-No pain      Patients Stated Pain Goal: 3 (62/83/15 1761)  Complications: No apparent anesthesia complications

## 2017-06-20 NOTE — Anesthesia Procedure Notes (Signed)
Arterial Line Insertion Start/End8/15/2018 8:40 AM, 06/20/2017 8:47 AM Performed by: Glo HerringLEE, Natavia Sublette B, CRNA  Patient location: Pre-op. Preanesthetic checklist: patient identified, IV checked, site marked, risks and benefits discussed, surgical consent, monitors and equipment checked, pre-op evaluation and timeout performed Lidocaine 1% used for infiltration Left, radial was placed Catheter size: 20 G Hand hygiene performed  and maximum sterile barriers used  Allen's test indicative of satisfactory collateral circulation Attempts: 2 Procedure performed without using ultrasound guided technique. Following insertion, dressing applied. Post procedure assessment: normal

## 2017-06-20 NOTE — Progress Notes (Signed)
Pharmacy Consult - Heparin  S/p stent by Dr. Corliss Skainseveshwar Initial heparin level < 0.10  Heparin off at 644 am  Plan: Increase heparin to 700 units / hr until stopped in AM No further levels  Thank you Okey RegalLisa Quinlan Vollmer, PharmD [phone]

## 2017-06-20 NOTE — H&P (Signed)
Chief Complaint: right vertebral artery stenosis  Referring Physician:Dr. Antony Contras  Supervising Physician: Luanne Bras  Patient Status: [name] - Out-pt  HPI: Erik [name] is a 61 y.o. 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  He underwent stenting on 03-07-17.  He tolerated this well and has been on Brilinta and ASA since then.  He had a recent carotid duplex which suggested further stenosis.  He was brought in on 06-14-17 for a repeat angiogram.  He was found to have significant right vertebral artery stenosis.  He has returned today for stenting of this vessel.  His preop labs are within normal limits as well as his P2Y12 which is 72.  He has no new complaints and still struggles with right-sided facial droop, although this appears slightly improved, and some right-sided weakness.  Past Medical History:  Past Medical History:  Diagnosis Date  . Anxiety    "stress"  . Arthritis    hands  . BPH (benign prostatic hyperplasia)   . Depression   . Diabetes mellitus without complication ([place]) 79/0240   Type II  . ED (erectile dysfunction)   . GERD (gastroesophageal reflux disease)   . H/O calcium pyrophosphate deposition disease (CPPD)   . HLD (hyperlipidemia)   . Hypertension   . Hypogonadism in male   . Lower urinary tract symptoms (LUTS)   . Nocturia   . Stroke [facility])    Language Impairment- 06/19/17- slurres some, improves when he slows dow.  Right side weakness- improved  . Testosterone overdose   . Vitamin D deficiency     Past Surgical History:  Past Surgical History:  Procedure Laterality Date  . BACK SURGERY  1986  . COLON SURGERY     Colon Resection  . COLONOSCOPY    . IR ANGIO INTRA EXTRACRAN SEL COM CAROTID INNOMINATE BILAT MOD SED  06/14/2017  . IR ANGIO INTRA EXTRACRAN SEL COM CAROTID INNOMINATE UNI L MOD SED  03/07/2017  . IR ANGIO INTRA EXTRACRAN SEL COM CAROTID INNOMINATE UNI R MOD SED  02/16/2017  . IR  ANGIO VERTEBRAL SEL SUBCLAVIAN INNOMINATE UNI L MOD SED  03/07/2017  . IR ANGIO VERTEBRAL SEL SUBCLAVIAN INNOMINATE UNI L MOD SED  06/14/2017  . IR ANGIO VERTEBRAL SEL VERTEBRAL UNI R MOD SED  03/07/2017  . IR ANGIO VERTEBRAL SEL VERTEBRAL UNI R MOD SED  06/14/2017  . IR INFUSION THROMBOL ARTERIAL INITIAL (MS)  03/07/2017  . IR INTRAVSC STENT CERV CAROTID W/EMB-PROT MOD SED INCL ANGIO  03/07/2017  . IR PERCUTANEOUS ART THROMBECTOMY/INFUSION INTRACRANIAL INC DIAG ANGIO  02/16/2017  . IR PTA NON CORO-LOWER EXTREM  02/16/2017  . IR RADIOLOGIST EVAL & MGMT  04/04/2017  . IR US GUIDE VASC ACCESS LEFT  02/16/2017  . IR US GUIDE VASC ACCESS RIGHT  02/16/2017  . KNEE ARTHROSCOPY Bilateral   . RADIOLOGY WITH ANESTHESIA N/A 02/16/2017   Procedure: RADIOLOGY WITH ANESTHESIA;  Surgeon: Luanne Bras, MD;  Location: [place];  Service: Radiology;  Laterality: N/A;  . RADIOLOGY WITH ANESTHESIA N/A 03/07/2017   Procedure: CAROTID STENT;  Surgeon: Luanne Bras, MD;  Location: [place];  Service: Radiology;  Laterality: N/A;  . ROTATOR CUFF REPAIR Right    x 2    Family History:  Family History  Problem Relation Age of Onset  . Diabetes Mother   . Hypertension Mother   . Dementia Mother   . [place] Hypertension Mother   .  Hyperlipidemia Mother   . Heart Problems Mother   . Heart Problems Brother     Social History:  reports that he has never smoked. He has never used smokeless tobacco. He reports that he drinks about 0.6 oz of alcohol per week . He reports that he does not use drugs.  Allergies:  Allergies  Allergen Reactions  . Shellfish-Derived Products Hives  . Celecoxib Nausea And Vomiting    Medications: Medications reviewed in epic  Please HPI for pertinent positives, otherwise complete 10 system ROS negative.  Mallampati Score: MD Evaluation Airway: WNL Heart: WNL Abdomen: WNL Chest/ Lungs: WNL ASA  Classification: 3 Mallampati/Airway Score: One  Physical Exam: BP (!) 144/73   Pulse (!)  59   Temp 98.2 F (36.8 C) (Oral)   Resp 18   Ht 5' 10" (1.778 m)   Wt 210 lb (95.3 kg)   SpO2 98%   BMI 30.13 kg/m  Body mass index is 30.13 kg/m. General: pleasant, WD, WN white male who is laying in bed in NAD HEENT: head is normocephalic, atraumatic.  Sclera are noninjected.  PERRL.  Ears and nose without any masses or lesions.  Mouth is pink and moist.  Still with some right facial droop Heart: regular, rate, and rhythm.  Normal s1,s2. No obvious murmurs, gallops, or rubs noted.  Palpable radial and pedal pulses bilaterally Lungs: CTAB, no wheezes, rhonchi, or rales noted.  Respiratory effort nonlabored Abd: soft, NT, ND, +BS, no masses, hernias, or organomegaly Neuro: good eye strength, PERRL, EOMI.  Unable to assess strength and coordination of upper extremities currently as he is having IVs and his A-line placed.  +4/5 strength in pulling up with his RLE, 5/5 in LLE.  Equal strength pushing down with his feet Psych: A&Ox3 with an appropriate affect.   Labs: Results for orders placed or performed during the hospital encounter of 06/20/17 (from the past 48 hour(s))  Glucose, capillary     Status: Abnormal   Collection Time: 06/20/17  6:56 AM  Result Value Ref Range   Glucose-Capillary 127 (H) 65 - 99 mg/dL  CBC WITH DIFFERENTIAL     Status: Abnormal   Collection Time: 06/20/17  7:13 AM  Result Value Ref Range   WBC 5.4 4.0 - 10.5 K/uL   RBC 4.30 4.22 - 5.81 MIL/uL   Hemoglobin 12.8 (L) 13.0 - 17.0 g/dL   HCT 37.5 (L) 39.0 - 52.0 %   MCV 87.2 78.0 - 100.0 fL   MCH 29.8 26.0 - 34.0 pg   MCHC 34.1 30.0 - 36.0 g/dL   RDW 13.9 11.5 - 15.5 %   Platelets 199 150 - 400 K/uL   Neutrophils Relative % 56 %   Neutro Abs 3.0 1.7 - 7.7 K/uL   Lymphocytes Relative 29 %   Lymphs Abs 1.6 0.7 - 4.0 K/uL   Monocytes Relative 11 %   Monocytes Absolute 0.6 0.1 - 1.0 K/uL   Eosinophils Relative 4 %   Eosinophils Absolute 0.2 0.0 - 0.7 K/uL   Basophils Relative 0 %   Basophils Absolute  0.0 0.0 - 0.1 K/uL  Comprehensive metabolic panel     Status: Abnormal   Collection Time: 06/20/17  7:13 AM  Result Value Ref Range   Sodium 137 135 - 145 mmol/L   Potassium 3.7 3.5 - 5.1 mmol/L   Chloride 102 101 - 111 mmol/L   CO2 25 22 - 32 mmol/L   Glucose, Bld 112 (H) 65 - 99 mg/dL  BUN 25 (H) 6 - 20 mg/dL   Creatinine, Ser 0.94 0.61 - 1.24 mg/dL   Calcium 9.7 8.9 - 10.3 mg/dL   Total Protein 6.9 6.5 - 8.1 g/dL   Albumin 4.2 3.5 - 5.0 g/dL   AST 27 15 - 41 U/L   ALT 26 17 - 63 U/L   Alkaline Phosphatase 26 (L) 38 - 126 U/L   Total Bilirubin 0.7 0.3 - 1.2 mg/dL   GFR calc non Af Amer >60 >60 mL/min   GFR calc Af Amer >60 >60 mL/min    Comment: (NOTE) The eGFR has been calculated using the CKD EPI equation. This calculation has not been validated in all clinical situations. eGFR's persistently <60 mL/min signify possible Chronic Kidney Disease.    Anion gap 10 5 - 15    Imaging: No results found.  Assessment/Plan 1. Right vertebral artery stenosis  We will plan intervention today with angioplasty vs stenting of this area as we are able.  His labs and vitals have been reviewed.  He has been taking his Brilinta and ASA as instructed.  If all goes well, he will be admitted overnight with plans for DC home in the am. Risks and benefits discussed with the patient including, but not limited to bleeding, infection, vascular injury or contrast induced renal failure, and CVA. All of the patient's questions were answered, patient is agreeable to proceed. Consent signed and in chart.   Thank you for this interesting consult.  I greatly enjoyed meeting DECKARD STUBER and look forward to participating in their care.  A copy of this report was sent to the requesting provider on this date.  Electronically Signed: Henreitta Cea 06/20/2017, 8:38 AM   I spent a total of    25 Minutes in face to face in clinical consultation, greater than 50% of which was counseling/coordinating  care for right vertebral artery stenosis

## 2017-06-21 ENCOUNTER — Encounter (HOSPITAL_COMMUNITY): Payer: Self-pay | Admitting: Interventional Radiology

## 2017-06-21 DIAGNOSIS — I6501 Occlusion and stenosis of right vertebral artery: Secondary | ICD-10-CM | POA: Diagnosis not present

## 2017-06-21 LAB — CBC WITH DIFFERENTIAL/PLATELET
BASOS ABS: 0 10*3/uL (ref 0.0–0.1)
BASOS PCT: 0 %
EOS ABS: 0.2 10*3/uL (ref 0.0–0.7)
EOS PCT: 3 %
HCT: 34.5 % — ABNORMAL LOW (ref 39.0–52.0)
Hemoglobin: 11.6 g/dL — ABNORMAL LOW (ref 13.0–17.0)
LYMPHS PCT: 27 %
Lymphs Abs: 1.9 10*3/uL (ref 0.7–4.0)
MCH: 29.8 pg (ref 26.0–34.0)
MCHC: 33.6 g/dL (ref 30.0–36.0)
MCV: 88.7 fL (ref 78.0–100.0)
Monocytes Absolute: 0.8 10*3/uL (ref 0.1–1.0)
Monocytes Relative: 11 %
Neutro Abs: 4.1 10*3/uL (ref 1.7–7.7)
Neutrophils Relative %: 59 %
PLATELETS: 178 10*3/uL (ref 150–400)
RBC: 3.89 MIL/uL — AB (ref 4.22–5.81)
RDW: 14.2 % (ref 11.5–15.5)
WBC: 7 10*3/uL (ref 4.0–10.5)

## 2017-06-21 LAB — BASIC METABOLIC PANEL
Anion gap: 8 (ref 5–15)
BUN: 16 mg/dL (ref 6–20)
CALCIUM: 9 mg/dL (ref 8.9–10.3)
CO2: 25 mmol/L (ref 22–32)
Chloride: 107 mmol/L (ref 101–111)
Creatinine, Ser: 0.85 mg/dL (ref 0.61–1.24)
GFR calc Af Amer: >60 mL/min (ref >60)
Glucose, Bld: 94 mg/dL (ref 65–99)
POTASSIUM: 4.1 mmol/L (ref 3.5–5.1)
SODIUM: 140 mmol/L (ref 135–145)

## 2017-06-21 LAB — PLATELET INHIBITION P2Y12: Platelet Function  P2Y12: 146 [PRU] — ABNORMAL LOW (ref 194–418)

## 2017-06-21 MED ORDER — MELATONIN 3 MG PO TABS: 3.0000 mg | ORAL_TABLET | Freq: Every evening | ORAL | Status: DC | PRN

## 2017-06-21 MED ORDER — FLUOXETINE HCL 20 MG PO CAPS
20.0000 mg | ORAL_CAPSULE | Freq: Every day | ORAL | Status: DC
  Administered 2017-06-21: 20 mg via ORAL
  Filled 2017-06-21: qty 1

## 2017-06-21 MED ORDER — NABUMETONE 500 MG PO TABS
750.0000 mg | ORAL_TABLET | Freq: Two times a day (BID) | ORAL | Status: DC
  Administered 2017-06-21: 750 mg via ORAL
  Filled 2017-06-21: qty 2

## 2017-06-21 MED ORDER — ACETAMINOPHEN 325 MG PO TABS: 650.0000 mg | ORAL_TABLET | ORAL | Status: DC | PRN

## 2017-06-21 MED ORDER — LOSARTAN POTASSIUM 50 MG PO TABS
25.0000 mg | ORAL_TABLET | Freq: Every day | ORAL | Status: DC
  Administered 2017-06-21: 25 mg via ORAL
  Filled 2017-06-21: qty 1

## 2017-06-21 MED ORDER — PANTOPRAZOLE SODIUM 40 MG PO TBEC: 40.0000 mg | DELAYED_RELEASE_TABLET | Freq: Every day | ORAL | Status: DC

## 2017-06-21 MED ORDER — HYDROCHLOROTHIAZIDE 25 MG PO TABS
25.0000 mg | ORAL_TABLET | Freq: Every day | ORAL | Status: DC
Start: 2017-06-21 — End: 2017-06-21
  Administered 2017-06-21: 25 mg via ORAL
  Filled 2017-06-21: qty 1

## 2017-06-21 MED ORDER — TAMSULOSIN HCL 0.4 MG PO CAPS: 0.4000 mg | ORAL_CAPSULE | Freq: Every day | ORAL | Status: DC

## 2017-06-21 MED ORDER — ROSUVASTATIN CALCIUM 40 MG PO TABS
40.0000 mg | ORAL_TABLET | Freq: Every day | ORAL | Status: DC
  Filled 2017-06-21: qty 1

## 2017-06-21 NOTE — Progress Notes (Signed)
Referring Physician(s): Dr Lina Sayre  Supervising Physician: Julieanne Cotton  Patient Status:  [facility] - In-pt  Chief Complaint: S/P RT VA origin stent assisted angioplasty 8/15   Subjective:  Doing well this am No complaints No pain No headache Denies N/V Eating well; slept ok Passing gas Moving all 4s Hep off 645 am  Allergies: Shellfish-derived products and Celecoxib  Medications: Prior to Admission medications   Medication Sig Start Date End Date Taking? Authorizing Provider  acetaminophen (TYLENOL) 325 MG tablet Take 2 tablets (650 mg total) by mouth every 4 (four) hours as needed for mild pain. 02/28/17  Yes Love, Evlyn Kanner, PA-C  aspirin 81 MG tablet Take 1 tablet (81 mg total) by mouth daily. 04/17/17  Yes Deveshwar, Simonne Maffucci, MD  CRESTOR 40 MG tablet Take 1 tablet (40 mg total) by mouth daily. 03/02/17  Yes Love, Evlyn Kanner, PA-C  FLUoxetine (PROZAC) 20 MG capsule Take 1 capsule (20 mg total) by mouth daily. 03/02/17  Yes Love, Evlyn Kanner, PA-C  hydrochlorothiazide (HYDRODIURIL) 25 MG tablet Take 1 tablet (25 mg total) by mouth daily. 03/02/17  Yes Love, Evlyn Kanner, PA-C  losartan (COZAAR) 25 MG tablet Take 1 tablet (25 mg total) by mouth daily. 03/02/17  Yes Love, Evlyn Kanner, PA-C  Melatonin 3 MG TABS Take 1 tablet (3 mg total) by mouth at bedtime as needed (sleep). 03/02/17  Yes Love, Evlyn Kanner, PA-C  metFORMIN (GLUCOPHAGE) 500 MG tablet Take 1 tablet (500 mg total) by mouth daily with breakfast. 03/03/17  Yes Love, Evlyn Kanner, PA-C  Multiple Vitamin (MULTIVITAMIN) tablet Take 1 tablet by mouth daily.   Yes [provider]  nabumetone (RELAFEN) 750 MG tablet TAKE 1 TABLET (750 MG TOTAL) BY MOUTH 2 (TWO) TIMES DAILY. 04/30/17  Yes [provider]  pantoprazole (PROTONIX) 40 MG tablet Take 1 tablet (40 mg total) by mouth at bedtime. 03/02/17  Yes Love, Evlyn Kanner, PA-C  tamsulosin (FLOMAX) 0.4 MG CAPS capsule Take 1 capsule (0.4 mg total) by mouth daily after supper.  03/02/17  Yes Love, Evlyn Kanner, PA-C  ticagrelor (BRILINTA) 90 MG TABS tablet Take 1 tablet (90 mg total) by mouth 2 (two) times daily. 03/08/17  Yes Barnetta Chapel, PA-C     Vital Signs: BP 132/67   Pulse 62   Temp 98.4 F (36.9 C) (Oral)   Resp 11   Ht 5\' 10"  (1.778 m)   Wt 210 lb (95.3 kg)   SpO2 96%   BMI 30.13 kg/m   Physical Exam  Constitutional: He is oriented to person, place, and time.  HENT:  Head: Atraumatic.  Slight right sided mouth droop  (baseline for pt)  Eyes: EOM are normal.  Neck: Neck supple.  Cardiovascular: Normal rate and regular rhythm.   Pulmonary/Chest: Effort normal and breath sounds normal.  Abdominal: Soft. Bowel sounds are normal.  Musculoskeletal: Normal range of motion.  Moving all 4s = strength Sensation intact  Neurological: He is alert and oriented to person, place, and time.  Skin: Skin is warm and dry.  Left groin NT no bleeding No hematoma Minimal amt ecchymosis at site  Left foot 2+ pulses  Psychiatric: He has a normal mood and affect. His behavior is normal.  Nursing note and vitals reviewed.   Imaging: No results found.  Labs:  CBC:  Recent Labs  03/08/17 0422 06/14/17 1001 06/20/17 0713 06/21/17 0211  WBC 7.6 5.3 5.4 7.0  HGB 10.2* 13.2 12.8* 11.6*  HCT 29.9* 39.9 37.5*  34.5*  PLT 265 194 199 178    COAGS:  Recent Labs  02/16/17 0038 03/07/17 0740 06/14/17 1001  INR 0.97 0.99 0.96  APTT 22* 25 25    BMP:  Recent Labs  03/08/17 0422 06/14/17 1001 06/20/17 0713 06/21/17 0211  NA 135 138 137 140  K 3.8 4.2 3.7 4.1  CL 102 102 102 107  CO2 23 29 25 25   GLUCOSE 125* 146* 112* 94  BUN 17 17 25* 16  CALCIUM 8.9 9.7 9.7 9.0  CREATININE 0.69 0.91 0.94 0.85  GFRNONAA >60 >60 >60 >60  GFRAA >60 >60 >60 >60    LIVER FUNCTION TESTS:  Recent Labs  02/16/17 0038 02/22/17 1630 03/07/17 0740 06/20/17 0713  BILITOT 0.6 0.6 0.8 0.7  AST 30 36 28 27  ALT 29 41 41 26  ALKPHOS 33* 34* 36* 26*  PROT  6.9 7.7 8.0 6.9  ALBUMIN 4.4 4.2 4.3 4.2    Assessment and Plan:  R Vertebral artery angioplasty/stent in IR 8/15 Overnight stay uneventful Plan for dc today after sees Dr Corliss Skainseveshwar  Electronically Signed: Ralene MuskratURPIN,Opal Dinning A, PA-C 06/21/2017, 7:06 AM   I spent a total of 15 Minutes at the the patient's bedside AND on the patient's hospital floor or unit, greater than 50% of which was counseling/coordinating care for R VA angioplasty/stent

## 2017-06-21 NOTE — Discharge Summary (Signed)
Patient ID: Erik [name] MRN: [id number] DOB/AGE: August 12, 1956 61 y.o.  Admit date: 06/20/2017 Discharge date: 06/21/2017  Supervising Physician: Dr Julieanne Cotton  Patient Status: [facility] - In-pt  Admission Diagnoses: CVA Right vertebral artery stenosis  Discharge Diagnoses:  Active Problems:   VBI (vertebrobasilar insufficiency)   Discharged Condition: improved  Hospital Course: CVA 02/2017; R internal carotid artery angioplasty/stent placement then. Recent carotid duplex revealed R Verterbral artery stenosis. Pt has undergone R VA angioiplasty/stent with Dr Corliss Skains 06/20/17. Tolerated well. No complications. Denies headache; N/V Eating and drinking well. Passing gas; urinating well on own. Neuro exam normal per Dr Corliss Skains He has seen and examined pt. Plan for discharge to home today. P2y12 pending  ADDENDUM: RN calls to tell me of Inverted T waves noted on monitor. Pt is asymptomatic He states he has known of this finding for years. Does not have a Cardiologist I contacted Cardiology at [facility] per Dr Corliss Skains Rec: to be seen as OP in next few days.--- pt is to call [phone] for appt asap. He has good understanding of plan and is agreeable  Consults: None  Significant Diagnostic Studies:  CEREBRAL ANGIOGRAM [[id number] (Custom)]     Treatments: S/P RT VA origin stent assisted angioplasty.  Discharge Exam: Blood pressure 132/72, pulse 67, temperature 98.3 F (36.8 C), temperature source Oral, resp. rate 14, height 5\' 10"  (1.778 m), weight 210 lb (95.3 kg), SpO2 98 %.  A/O Appropriate Pleasant Smile: sl rt droop--baseline Face symmetrical EOM; PERRL Tongue midline Heart: RRR Lungs: CTA Abd: soft +BS; NT Ext: FROM Good strength and sensation =B Moves all 4s Good grip =B Left groin site id clean and dry; no bleeding No hematoma NT B Feet 2+ pulses  Results for orders placed or performed during the hospital encounter of 06/20/17  MRSA PCR  Screening  Result Value Ref Range   MRSA by PCR NEGATIVE NEGATIVE  Glucose, capillary  Result Value Ref Range   Glucose-Capillary 127 (H) 65 - 99 mg/dL  CBC WITH DIFFERENTIAL  Result Value Ref Range   WBC 5.4 4.0 - 10.5 K/uL   RBC 4.30 4.22 - 5.81 MIL/uL   Hemoglobin 12.8 (L) 13.0 - 17.0 g/dL   HCT 15.1 (L) 76.1 - 60.7 %   MCV 87.2 78.0 - 100.0 fL   MCH 29.8 26.0 - 34.0 pg   MCHC 34.1 30.0 - 36.0 g/dL   RDW 37.1 06.2 - 69.4 %   Platelets 199 150 - 400 K/uL   Neutrophils Relative % 56 %   Neutro Abs 3.0 1.7 - 7.7 K/uL   Lymphocytes Relative 29 %   Lymphs Abs 1.6 0.7 - 4.0 K/uL   Monocytes Relative 11 %   Monocytes Absolute 0.6 0.1 - 1.0 K/uL   Eosinophils Relative 4 %   Eosinophils Absolute 0.2 0.0 - 0.7 K/uL   Basophils Relative 0 %   Basophils Absolute 0.0 0.0 - 0.1 K/uL  Comprehensive metabolic panel  Result Value Ref Range   Sodium 137 135 - 145 mmol/L   Potassium 3.7 3.5 - 5.1 mmol/L   Chloride 102 101 - 111 mmol/L   CO2 25 22 - 32 mmol/L   Glucose, Bld 112 (H) 65 - 99 mg/dL   BUN 25 (H) 6 - 20 mg/dL   Creatinine, Ser 8.54 0.61 - 1.24 mg/dL   Calcium 9.7 8.9 - 62.7 mg/dL   Total Protein 6.9 6.5 - 8.1 g/dL   Albumin 4.2 3.5 - 5.0 g/dL   AST 27  15 - 41 U/L   ALT 26 17 - 63 U/L   Alkaline Phosphatase 26 (L) 38 - 126 U/L   Total Bilirubin 0.7 0.3 - 1.2 mg/dL   GFR calc non Af Amer >60 >60 mL/min   GFR calc Af Amer >60 >60 mL/min   Anion gap 10 5 - 15  Glucose, capillary  Result Value Ref Range   Glucose-Capillary 137 (H) 65 - 99 mg/dL  Heparin level (unfractionated)  Result Value Ref Range   Heparin Unfractionated <0.10 (L) 0.30 - 0.70 IU/mL  Basic metabolic panel  Result Value Ref Range   Sodium 140 135 - 145 mmol/L   Potassium 4.1 3.5 - 5.1 mmol/L   Chloride 107 101 - 111 mmol/L   CO2 25 22 - 32 mmol/L   Glucose, Bld 94 65 - 99 mg/dL   BUN 16 6 - 20 mg/dL   Creatinine, Ser 2.13 0.61 - 1.24 mg/dL   Calcium 9.0 8.9 - 08.6 mg/dL   GFR calc non Af Amer >60  >60 mL/min   GFR calc Af Amer >60 >60 mL/min   Anion gap 8 5 - 15  CBC WITH DIFFERENTIAL  Result Value Ref Range   WBC 7.0 4.0 - 10.5 K/uL   RBC 3.89 (L) 4.22 - 5.81 MIL/uL   Hemoglobin 11.6 (L) 13.0 - 17.0 g/dL   HCT 57.8 (L) 46.9 - 62.9 %   MCV 88.7 78.0 - 100.0 fL   MCH 29.8 26.0 - 34.0 pg   MCHC 33.6 30.0 - 36.0 g/dL   RDW 52.8 41.3 - 24.4 %   Platelets 178 150 - 400 K/uL   Neutrophils Relative % 59 %   Neutro Abs 4.1 1.7 - 7.7 K/uL   Lymphocytes Relative 27 %   Lymphs Abs 1.9 0.7 - 4.0 K/uL   Monocytes Relative 11 %   Monocytes Absolute 0.8 0.1 - 1.0 K/uL   Eosinophils Relative 3 %   Eosinophils Absolute 0.2 0.0 - 0.7 K/uL   Basophils Relative 0 %   Basophils Absolute 0.0 0.0 - 0.1 K/uL  POCT Activated clotting time  Result Value Ref Range   Activated Clotting Time 147 seconds  POCT Activated clotting time  Result Value Ref Range   Activated Clotting Time 153 seconds     Disposition:  Right vertebral artery stenosis Rt VA angioplasty/stent placed in IR with Dr Corliss Skains Pt tolerated well Plan for discharge home now. Pt has been seen and examined by Dr Corliss Skains Resume all meds Continue ASA 81 mg and Brilinta 90 mg BID Pt has good understanding of DC instructions  ADDENDUM: RN calls to tell me of Inverted T waves noted on monitor. Pt is asymptomatic He states he has known of this finding for years. Does not have a Cardiologist I contacted Cardiology at [facility] per Dr Corliss Skains Rec: to be seen as OP in next few days.--- pt is to call [phone] for appt asap. He has good understanding of plan and is agreeable  Discharge Instructions    Call MD for:  difficulty breathing, headache or visual disturbances    Complete by:  As directed    Call MD for:  extreme fatigue    Complete by:  As directed    Call MD for:  hives    Complete by:  As directed    Call MD for:  persistant dizziness or light-headedness    Complete by:  As directed    Call MD for:  persistant  nausea and  vomiting    Complete by:  As directed    Call MD for:  redness, tenderness, or signs of infection (pain, swelling, redness, odor or green/yellow discharge around incision site)    Complete by:  As directed    Call MD for:  severe uncontrolled pain    Complete by:  As directed    Call MD for:  temperature >100.4    Complete by:  As directed    Diet - low sodium heart healthy    Complete by:  As directed    Discharge instructions    Complete by:  As directed    2 week follow up with Dr Corliss Skainseveshwar; he will hear from scheduler for time and date; resume all home meds; Continue ASA and Brilinta 90 mg BID; call [phone] if questions or needs   Discharge wound care:    Complete by:  As directed    May shower today; keep new band aid on left groin site daily x 1 week   Driving Restrictions    Complete by:  As directed    No driving x 2 weeks   Increase activity slowly    Complete by:  As directed    Lifting restrictions    Complete by:  As directed    No lifting over 10 lbs; no stooping x 2 weeks     Allergies as of 06/21/2017      Reactions   Shellfish-derived Products Hives   Celecoxib Nausea And Vomiting      Medication List    TAKE these medications   acetaminophen 325 MG tablet Commonly known as:  TYLENOL Take 2 tablets (650 mg total) by mouth every 4 (four) hours as needed for mild pain.   aspirin 81 MG tablet Take 1 tablet (81 mg total) by mouth daily.   CRESTOR 40 MG tablet Generic drug:  rosuvastatin Take 1 tablet (40 mg total) by mouth daily.   FLUoxetine 20 MG capsule Commonly known as:  PROZAC Take 1 capsule (20 mg total) by mouth daily.   hydrochlorothiazide 25 MG tablet Commonly known as:  HYDRODIURIL Take 1 tablet (25 mg total) by mouth daily.   losartan 25 MG tablet Commonly known as:  COZAAR Take 1 tablet (25 mg total) by mouth daily.   Melatonin 3 MG Tabs Take 1 tablet (3 mg total) by mouth at bedtime as needed (sleep).   metFORMIN  500 MG tablet Commonly known as:  GLUCOPHAGE Take 1 tablet (500 mg total) by mouth daily with breakfast.   multivitamin tablet Take 1 tablet by mouth daily.   nabumetone 750 MG tablet Commonly known as:  RELAFEN TAKE 1 TABLET (750 MG TOTAL) BY MOUTH 2 (TWO) TIMES DAILY.   pantoprazole 40 MG tablet Commonly known as:  PROTONIX Take 1 tablet (40 mg total) by mouth at bedtime.   tamsulosin 0.4 MG Caps capsule Commonly known as:  FLOMAX Take 1 capsule (0.4 mg total) by mouth daily after supper.   ticagrelor 90 MG Tabs tablet Commonly known as:  BRILINTA Take 1 tablet (90 mg total) by mouth 2 (two) times daily.      Follow-up Information    Julieanne Cottoneveshwar, Sanjeev, MD Follow up in 2 week(s).   Specialty:  Interventional Radiology Why:  follow up 2 weeks with DrDeveshwar; pt will hear from scheduler for time and date; call [phone] if needs Contact information: [address] [place] [address] [phone]  Electronically Signed: Robet Leu, PA-C 06/21/2017, 9:18 AM   I have spent Greater Than 30 Minutes discharging Keturah Barre.

## 2017-06-21 NOTE — Progress Notes (Signed)
Patient ID: Erik [name], male   DOB: Apr 05, 1956, 61 y.o.   MRN: [id number] INR. POst procedure day 1. Fair night. No c/o N/V/vomitting ,visual ,motor or sensory or coordination or speech difficulties. No chest pains,SOB .Eating and drinking. O/E neuro exam unchanged. Mild Rt facial droop and  dysarthria. Stable.. Lt groin soft.Distal pulses DPs 2+ and PTs + bilaterally. Plan.. Ambulate ,Increase oral intake . If tolerated D/C to home with spouse. Instructed to drink water to maintain hydration.Marland Kitchen. Resume home meds. avoid stooping ,bending or lifting 10 lbs or more for 2 weeks. RTC in 2 weeks . Patient expressed his understanding. S.Amron Guerrette MD

## 2017-06-21 NOTE — Progress Notes (Cosign Needed)
Patient ID: Erik [name], male   DOB: 11-28-1955, 61 y.o.   MRN: [id number]   RN has called to report asymptomatic T wave inversion on EKG monitor. Pt has been aware of this finding for years He has no CP or complaints  I discussed with Dr Gildardo Poundseveshwar Spoke to Inpt Cardiology Rec: OP Cardiology evaluation Call [phone]  Pt is aware and agreeable

## 2017-06-21 NOTE — Progress Notes (Signed)
Pt's cardiac monitor has inverted T waves, present upon review of cardiac monitoring while in ICU this admission. Pt asymptomatic. Pt stated that he had inverted T waves 20 years ago and had a stress test done at that time. Radiology PA aware.  Per PA who spoke with cardiology, ok for d/c and to f/u with cardiologist via # [phone]. Pt verbalizes understanding.

## 2017-06-21 NOTE — Progress Notes (Addendum)
Patient reviewed and given discharge instructions including medications, site care, when to call MD, 911 and stroke prevention/warning s/s. Patient and wife verbalized understanding. Pt aware of instructions to call cardiology and f/u. Patient given number to call. Also aware of other MD f/u needs/appointments. Patient dressed, equipment removed, PIV removed. Patient neurologically stable, groin site stable, denies pain. Belongings given to patient and pt then d/c home with wife via wheelchair/NT escort.

## 2017-06-22 ENCOUNTER — Encounter (HOSPITAL_COMMUNITY): Payer: Self-pay | Admitting: Interventional Radiology

## 2017-06-25 ENCOUNTER — Telehealth (HOSPITAL_COMMUNITY): Payer: Self-pay

## 2017-06-25 NOTE — Telephone Encounter (Signed)
Called to schedule 2 wk f/u, left message for pt to return call. AW 

## 2017-07-05 ENCOUNTER — Ambulatory Visit (HOSPITAL_COMMUNITY)
Admission: RE | Admit: 2017-07-05 | Discharge: 2017-07-05 | Disposition: A | Discharge status: Home / Self Care | Payer: Managed Care, Other (non HMO) | Source: Ambulatory Visit | Attending: Radiology | Admitting: Radiology

## 2017-07-05 DIAGNOSIS — G450 Vertebro-basilar artery syndrome: Secondary | ICD-10-CM

## 2017-07-05 HISTORY — PX: IR RADIOLOGIST EVAL & MGMT: IMG5224

## 2017-07-06 ENCOUNTER — Encounter (HOSPITAL_COMMUNITY): Payer: Self-pay | Admitting: Interventional Radiology

## 2017-07-13 ENCOUNTER — Ambulatory Visit (HOSPITAL_BASED_OUTPATIENT_CLINIC_OR_DEPARTMENT_OTHER): Payer: Managed Care, Other (non HMO) | Admitting: Physical Medicine & Rehabilitation

## 2017-07-13 ENCOUNTER — Encounter: Payer: Self-pay | Admitting: Physical Medicine & Rehabilitation

## 2017-07-13 ENCOUNTER — Encounter: Payer: Managed Care, Other (non HMO) | Attending: Physical Medicine & Rehabilitation

## 2017-07-13 VITALS — BP 135/71 | HR 73

## 2017-07-13 DIAGNOSIS — I69391 Dysphagia following cerebral infarction: Secondary | ICD-10-CM

## 2017-07-13 DIAGNOSIS — I10 Essential (primary) hypertension: Secondary | ICD-10-CM | POA: Insufficient documentation

## 2017-07-13 DIAGNOSIS — I69398 Other sequelae of cerebral infarction: Secondary | ICD-10-CM

## 2017-07-13 DIAGNOSIS — I69322 Dysarthria following cerebral infarction: Secondary | ICD-10-CM

## 2017-07-13 DIAGNOSIS — E119 Type 2 diabetes mellitus without complications: Secondary | ICD-10-CM | POA: Insufficient documentation

## 2017-07-13 DIAGNOSIS — R269 Unspecified abnormalities of gait and mobility: Secondary | ICD-10-CM | POA: Insufficient documentation

## 2017-07-13 DIAGNOSIS — I69351 Hemiplegia and hemiparesis following cerebral infarction affecting right dominant side: Secondary | ICD-10-CM | POA: Diagnosis not present

## 2017-07-13 DIAGNOSIS — F419 Anxiety disorder, unspecified: Secondary | ICD-10-CM | POA: Insufficient documentation

## 2017-07-13 DIAGNOSIS — I69320 Aphasia following cerebral infarction: Secondary | ICD-10-CM | POA: Insufficient documentation

## 2017-07-13 DIAGNOSIS — E785 Hyperlipidemia, unspecified: Secondary | ICD-10-CM | POA: Diagnosis not present

## 2017-07-13 DIAGNOSIS — I69920 Aphasia following unspecified cerebrovascular disease: Secondary | ICD-10-CM | POA: Diagnosis not present

## 2017-07-13 NOTE — Progress Notes (Signed)
Subjective:    Patient ID: Erik [name], male    DOB: 11/08/1955, 61 y.o.   MRN: [id number]  HPI Worked for 1 st time back at [employer].  Shipping and receiving supervisor.  Worked 10hrs yesterday and 6 hours today.  No excess fatigue at end of day but did take a nap after he got home.  No problem with communication at work, tried speaking more slowly. Driving ok.   Pain Inventory Average Pain 0 Pain Right Now 0 My pain is 0  In the last 24 hours, has pain interfered with the following? General activity 0 Relation with others 0 Enjoyment of life 0 What TIME of day is your pain at its worst? na Sleep (in general) Fair  Pain is worse with: na Pain improves with: na Relief from Meds: 5  Mobility walk without assistance  Function Do you have any goals in this area?  no  Neuro/Psych No problems in this area  Prior Studies Any changes since last visit?  no  Physicians involved in your care Any changes since last visit?  no   Family History  Problem Relation Age of Onset  . Diabetes Mother   . Hypertension Mother   . Dementia Mother   . Malig Hypertension Mother   . Hyperlipidemia Mother   . Heart Problems Mother   . Heart Problems Brother    Social History   Social History  . Marital status: Married    Spouse name: N/A  . Number of children: N/A  . Years of education: N/A   Social History Main Topics  . Smoking status: Never Smoker  . Smokeless tobacco: Never Used  . Alcohol use 0.6 oz/week    1 Cans of beer per week     Comment: drinks every day vodka with water 8//14/18 drinks a beer ocassional   . Drug use: No  . Sexual activity: Yes   Other Topics Concern  . Not on file   Social History Narrative  . No narrative on file   Past Surgical History:  Procedure Laterality Date  . BACK SURGERY  1986  . COLON SURGERY     Colon Resection  . COLONOSCOPY    . IR ANGIO INTRA EXTRACRAN SEL COM CAROTID INNOMINATE BILAT MOD SED  06/14/2017  . IR ANGIO INTRA  EXTRACRAN SEL COM CAROTID INNOMINATE UNI L MOD SED  03/07/2017  . IR ANGIO INTRA EXTRACRAN SEL COM CAROTID INNOMINATE UNI R MOD SED  02/16/2017  . IR ANGIO VERTEBRAL SEL SUBCLAVIAN INNOMINATE UNI L MOD SED  03/07/2017  . IR ANGIO VERTEBRAL SEL SUBCLAVIAN INNOMINATE UNI L MOD SED  06/14/2017  . IR ANGIO VERTEBRAL SEL VERTEBRAL UNI R MOD SED  03/07/2017  . IR ANGIO VERTEBRAL SEL VERTEBRAL UNI R MOD SED  06/14/2017  . IR INFUSION THROMBOL ARTERIAL INITIAL (MS)  03/07/2017  . IR INTRAVSC STENT CERV CAROTID W/EMB-PROT MOD SED INCL ANGIO  03/07/2017  . IR PERCUTANEOUS ART THROMBECTOMY/INFUSION INTRACRANIAL INC DIAG ANGIO  02/16/2017  . IR PTA NON CORO-LOWER EXTREM  02/16/2017  . IR RADIOLOGIST EVAL & MGMT  04/04/2017  . IR RADIOLOGIST EVAL & MGMT  07/05/2017  . IR TRANSCATH EXCRAN VERT OR CAR A STENT  06/20/2017  . IR US GUIDE VASC ACCESS LEFT  02/16/2017  . IR US GUIDE VASC ACCESS RIGHT  02/16/2017  . KNEE ARTHROSCOPY Bilateral   . RADIOLOGY WITH ANESTHESIA N/A 02/16/2017   Procedure: RADIOLOGY WITH ANESTHESIA;  Surgeon: Julieanne CottonSanjeev Deveshwar, MD;  Location: [facility] OR;  Service: Radiology;  Laterality: N/A;  . RADIOLOGY WITH ANESTHESIA N/A 03/07/2017   Procedure: CAROTID STENT;  Surgeon: Julieanne Cotton, MD;  Location: [facility] OR;  Service: Radiology;  Laterality: N/A;  . RADIOLOGY WITH ANESTHESIA N/A 06/20/2017   Procedure: RADIOLOGY WITH ANESTHESIA STENTING;  Surgeon: Julieanne Cotton, MD;  Location: [facility] OR;  Service: Radiology;  Laterality: N/A;  . ROTATOR CUFF REPAIR Right    x 2   Past Medical History:  Diagnosis Date  . Anxiety    "stress"  . Arthritis    hands  . BPH (benign prostatic hyperplasia)   . Depression   . 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 ED (erectile dysfunction)   . GERD (gastroesophageal reflux disease)   . H/O calcium pyrophosphate deposition disease (CPPD)   . HLD (hyperlipidemia)   . Hypertension   . Hypogonadism in male   . Lower urinary tract symptoms (LUTS)    . Nocturia   . Stroke [facility])    Language Impairment- 06/19/17- slurres some, improves when he slows dow.  Right side weakness- improved  . Testosterone overdose   . Vitamin D deficiency    There were no vitals taken for this visit.  Opioid Risk Score:   Fall Risk Score:  `1  Depression screen PHQ 2/9  Depression screen PHQ 2/9 04/27/2017  Decreased Interest 0  Down, Depressed, Hopeless 0  PHQ - 2 Score 0     Review of Systems  Constitutional: Negative.   HENT: Negative.   Eyes: Negative.   Respiratory: Negative.   Cardiovascular: Negative.   Gastrointestinal: Negative.   Endocrine: Negative.   Genitourinary: Negative.   Musculoskeletal: Negative.   Skin: Negative.   Allergic/Immunologic: Negative.   Neurological: Negative.   Hematological: Negative.   Psychiatric/Behavioral: Negative.   All other systems reviewed and are negative.      Objective:   Physical Exam  Constitutional: He is oriented to person, place, and time. He appears well-developed and well-nourished.  HENT:  Head: Normocephalic and atraumatic.  Eyes: Pupils are equal, round, and reactive to light. Conjunctivae and EOM are normal.  Neurological: He is alert and oriented to person, place, and time. He displays a negative Romberg sign. Gait abnormal.  Speech is dysarthric but fluent  Motor strength is 4/5 in the right , bicep, tricep, grip 5/5 right deltoid 5/5 right hip flexor, knee extensor, ankle dorsiflexor 5/5 left deltoid, bicep, tricep, grip, hip flexor, knee extensor, ankle dorsiflexor  Patient walks with a stiff legged gait on the right side, he has mild difficulty with toe walk and heel walk. Mild difficulty with tandem gait.  Mild fine motor deficits, right hand  Psychiatric: He has a normal mood and affect.  Nursing note and vitals reviewed.         Assessment & Plan:  1. History of left MCA infarct with history of extracranial stenosis. He has most recently undergone right  vertebral artery stenting, has had previous right ICA stenting He has had excellent recovery from left MCA infarct, although he still has some residual dysarthria and a mild right upper extremity weakness as well as mild gait disorder. 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 He is only worked one day. No issues identified. Patient will return to see me in 3 months and is been instructed to call me if he develops any difficulty with tolerating his work schedule. He will also follow-up with neuroradiology

## 2017-07-13 NOTE — Patient Instructions (Signed)
Please discontinue nabumetone since this can increase stroke risk, you may use Tylenol in its place up to 3000 mg per day

## 2017-08-22 ENCOUNTER — Ambulatory Visit: Payer: 59 | Admitting: Nurse Practitioner

## 2017-10-12 ENCOUNTER — Encounter: Payer: Self-pay | Admitting: Physical Medicine & Rehabilitation

## 2017-10-12 ENCOUNTER — Ambulatory Visit (HOSPITAL_BASED_OUTPATIENT_CLINIC_OR_DEPARTMENT_OTHER): Payer: Managed Care, Other (non HMO) | Admitting: Physical Medicine & Rehabilitation

## 2017-10-12 ENCOUNTER — Encounter: Payer: Managed Care, Other (non HMO) | Attending: Physical Medicine & Rehabilitation

## 2017-10-12 VITALS — BP 141/76 | HR 63 | Resp 14

## 2017-10-12 DIAGNOSIS — I69320 Aphasia following cerebral infarction: Secondary | ICD-10-CM | POA: Insufficient documentation

## 2017-10-12 DIAGNOSIS — I69322 Dysarthria following cerebral infarction: Secondary | ICD-10-CM

## 2017-10-12 DIAGNOSIS — R269 Unspecified abnormalities of gait and mobility: Secondary | ICD-10-CM | POA: Diagnosis not present

## 2017-10-12 DIAGNOSIS — E119 Type 2 diabetes mellitus without complications: Secondary | ICD-10-CM | POA: Insufficient documentation

## 2017-10-12 DIAGNOSIS — F419 Anxiety disorder, unspecified: Secondary | ICD-10-CM | POA: Insufficient documentation

## 2017-10-12 DIAGNOSIS — E785 Hyperlipidemia, unspecified: Secondary | ICD-10-CM | POA: Insufficient documentation

## 2017-10-12 DIAGNOSIS — I69351 Hemiplegia and hemiparesis following cerebral infarction affecting right dominant side: Secondary | ICD-10-CM

## 2017-10-12 DIAGNOSIS — I10 Essential (primary) hypertension: Secondary | ICD-10-CM | POA: Diagnosis not present

## 2017-10-12 DIAGNOSIS — I69398 Other sequelae of cerebral infarction: Secondary | ICD-10-CM | POA: Diagnosis not present

## 2017-10-12 NOTE — Progress Notes (Signed)
Subjective:    Patient ID: Erik [name], male    DOB: 1956-05-28, 61 y.o.   MRN: [id number] 61 y.o. male with history of BPH, OA s/p B-TKR , HTN (wife/patient deny) who was admitted on 02/16/17 after being found by wife lying on the ground with inability to speak. CTA head/neck revealed hyperdense L-MCA sign likely indicating acute thrombus. He received tPA  and he underwent cerebral angiogram with L-MCA thrombectomy, dense calcified plaque L-ICA precluded stent placement and noted to have 73% stenosis R-ICA due to advanced atherosclerotic changes. MRI/MRA brain reviewed, showing multiple areas of infarct. Carotid dopplers with right severe mixed plaque origin and proximal ICA/ECA 80-99%. HPI Off Crestor due to myalgia, repeat lipid studies reportedly normal.  Myalgias resolved 1 wk after Crestor stopped Patient is not on any alternative lipid lowering agent  Still has dysarthria that responds to slowing down rate of speech  Endurance for work has improved , 10hr shifts were difficult at first, now does this on a routine basis.  Does leg lifts and pulldowns at home for exercise Independent with all activities of daily living, driving without difficulty No falls Pain Inventory Average Pain 0 Pain Right Now 0 My pain is no pain  In the last 24 hours, has pain interfered with the following? General activity 0 Relation with others 0 Enjoyment of life 0 What TIME of day is your pain at its worst? no pain Sleep (in general) Good  Pain is worse with: no pain Pain improves with: no pain Relief from Meds: 0  Mobility walk without assistance how many minutes can you walk? unlimited ability to climb steps?  yes do you drive?  yes  Function employed # of hrs/week .  Neuro/Psych No problems in this area  Prior Studies Any changes since last visit?  no  Physicians involved in your care Any changes since last visit?  no   Family History  Problem Relation Age of Onset  .  Diabetes Mother   . Hypertension Mother   . Dementia Mother   . Malig Hypertension Mother   . Hyperlipidemia Mother   . Heart Problems Mother   . Heart Problems Brother    Social History   Socioeconomic History  . Marital status: Married    Spouse name: None  . Number of children: None  . Years of education: None  . Highest education level: None  Social Needs  . Financial resource strain: None  . Food insecurity - worry: None  . Food insecurity - inability: None  . Transportation needs - medical: None  . Transportation needs - non-medical: None  Occupational History  . None  Tobacco Use  . Smoking status: Never Smoker  . Smokeless tobacco: Never Used  Substance and Sexual Activity  . Alcohol use: Yes    Alcohol/week: 0.6 oz    Types: 1 Cans of beer per week    Comment: drinks every day vodka with water 8//14/18 drinks a beer ocassional   . Drug use: No  . Sexual activity: Yes  Other Topics Concern  . None  Social History Narrative  . None   Past Surgical History:  Procedure Laterality Date  . BACK SURGERY  1986  . COLON SURGERY     Colon Resection  . COLONOSCOPY    . IR ANGIO INTRA EXTRACRAN SEL COM CAROTID INNOMINATE BILAT MOD SED  06/14/2017  . IR ANGIO INTRA EXTRACRAN SEL COM CAROTID INNOMINATE UNI L MOD SED  03/07/2017  . IR  ANGIO INTRA EXTRACRAN SEL COM CAROTID INNOMINATE UNI R MOD SED  02/16/2017  . IR ANGIO VERTEBRAL SEL SUBCLAVIAN INNOMINATE UNI L MOD SED  03/07/2017  . IR ANGIO VERTEBRAL SEL SUBCLAVIAN INNOMINATE UNI L MOD SED  06/14/2017  . IR ANGIO VERTEBRAL SEL VERTEBRAL UNI R MOD SED  03/07/2017  . IR ANGIO VERTEBRAL SEL VERTEBRAL UNI R MOD SED  06/14/2017  . IR INFUSION THROMBOL ARTERIAL INITIAL (MS)  03/07/2017  . IR INTRAVSC STENT CERV CAROTID W/EMB-PROT MOD SED INCL ANGIO  03/07/2017  . IR PERCUTANEOUS ART THROMBECTOMY/INFUSION INTRACRANIAL INC DIAG ANGIO  02/16/2017  . IR PTA NON CORO-LOWER EXTREM  02/16/2017  . IR RADIOLOGIST EVAL & MGMT  04/04/2017  . IR  RADIOLOGIST EVAL & MGMT  07/05/2017  . IR TRANSCATH EXCRAN VERT OR CAR A STENT  06/20/2017  . IR US GUIDE VASC ACCESS LEFT  02/16/2017  . IR US GUIDE VASC ACCESS RIGHT  02/16/2017  . KNEE ARTHROSCOPY Bilateral   . RADIOLOGY WITH ANESTHESIA N/A 02/16/2017   Procedure: RADIOLOGY WITH ANESTHESIA;  Surgeon: Julieanne CottonSanjeev Deveshwar, MD;  Location: [facility] OR;  Service: Radiology;  Laterality: N/A;  . RADIOLOGY WITH ANESTHESIA N/A 03/07/2017   Procedure: CAROTID STENT;  Surgeon: Julieanne CottonSanjeev Deveshwar, MD;  Location: [facility] OR;  Service: Radiology;  Laterality: N/A;  . RADIOLOGY WITH ANESTHESIA N/A 06/20/2017   Procedure: RADIOLOGY WITH ANESTHESIA STENTING;  Surgeon: Julieanne Cottoneveshwar, Sanjeev, MD;  Location: [facility] OR;  Service: Radiology;  Laterality: N/A;  . ROTATOR CUFF REPAIR Right    x 2   Past Medical History:  Diagnosis Date  . Anxiety    "stress"  . Arthritis    hands  . BPH (benign prostatic hyperplasia)   . Depression   . 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 ED (erectile dysfunction)   . GERD (gastroesophageal reflux disease)   . H/O calcium pyrophosphate deposition disease (CPPD)   . HLD (hyperlipidemia)   . Hypertension   . Hypogonadism in male   . Lower urinary tract symptoms (LUTS)   . Nocturia   . Stroke [facility](HCC)    Language Impairment- 06/19/17- slurres some, improves when he slows dow.  Right side weakness- improved  . Testosterone overdose   . Vitamin D deficiency    BP (!) 141/76   Pulse 63   Resp 14   SpO2 93%   Opioid Risk Score:   Fall Risk Score:  `1  Depression screen PHQ 2/9  Depression screen PHQ 2/9 04/27/2017  Decreased Interest 0  Down, Depressed, Hopeless 0  PHQ - 2 Score 0    Review of Systems  Constitutional: Negative.   HENT: Negative.   Eyes: Negative.   Respiratory: Negative.   Cardiovascular: Negative.   Gastrointestinal: Negative.   Endocrine: Negative.   Genitourinary: Negative.   Musculoskeletal: Negative.   Skin: Negative.     Allergic/Immunologic: Negative.   Neurological: Negative.   Hematological: Negative.   Psychiatric/Behavioral: Negative.        Objective:   Physical Exam  Constitutional: He is oriented to person, place, and time. He appears well-developed and well-nourished. No distress.  HENT:  Head: Normocephalic and atraumatic.  Eyes: Conjunctivae and EOM are normal. Pupils are equal, round, and reactive to light.  Neurological: He is alert and oriented to person, place, and time.  Skin: He is not diaphoretic.  Psychiatric: He has a normal mood and affect. His behavior is normal. Judgment and thought content normal.  Nursing note and  vitals reviewed.   Speech is dysarthric but fluent, no word finding difficulties  Motor strength is 4+/5 in the right , bicep, 5/5 tricep, grip 5/5 right deltoid 5/5 right hip flexor, knee extensor, 4/5 ankle dorsiflexor 5/5 left deltoid, bicep, tricep, grip, hip flexor, knee extensor, ankle dorsiflexor  Patient walks with a mild hip hike on the right side, he has mild difficulty with toe walk and heel walk. Mild difficulty with tandem gait.  Finger to thumb coordination equal in BUE      Assessment & Plan:  #1.  Left MCA distribution infarct status post thrombectomy right ICA, without stenting in April 2018 Right vertebral artery angioplasty and stenting 06/20/2017  Overall excellent functional recovery.  He has some residual mild right bicep weakness as well as right ankle dorsiflexor weakness.  The upper extremity weakness does not impact his function however lower extremity still is contributing to a mild gait disorder.  This is not interfering with work activities however.  We discussed that it is unlikely that he will get full improvement in his strength but he may have some additional improvement with further exercise. In terms of his dysarthria, I did not see much improvement over the last 3 months.  It is likely that he will have residual dysarthria  from the stroke but he can help symptoms by slowing down the rate of speech No further therapy recommended. Physical medicine rehab follow-up on as-needed basis Neurology follow-up next month

## 2017-10-12 NOTE — Patient Instructions (Signed)
Make sure to attend neuro visit

## 2017-10-31 ENCOUNTER — Other Ambulatory Visit (HOSPITAL_COMMUNITY): Payer: Self-pay | Admitting: Interventional Radiology

## 2017-10-31 DIAGNOSIS — G450 Vertebro-basilar artery syndrome: Secondary | ICD-10-CM

## 2017-11-01 ENCOUNTER — Encounter (HOSPITAL_COMMUNITY): Payer: Self-pay

## 2017-11-01 ENCOUNTER — Ambulatory Visit (HOSPITAL_COMMUNITY): Payer: Managed Care, Other (non HMO)

## 2017-11-01 ENCOUNTER — Ambulatory Visit (HOSPITAL_COMMUNITY)
Admission: RE | Admit: 2017-11-01 | Discharge: 2017-11-01 | Disposition: A | Discharge status: Home / Self Care | Payer: Managed Care, Other (non HMO) | Source: Ambulatory Visit | Attending: Interventional Radiology | Admitting: Interventional Radiology

## 2017-11-01 DIAGNOSIS — R9389 Abnormal findings on diagnostic imaging of other specified body structures: Secondary | ICD-10-CM | POA: Insufficient documentation

## 2017-11-01 DIAGNOSIS — I6523 Occlusion and stenosis of bilateral carotid arteries: Secondary | ICD-10-CM | POA: Insufficient documentation

## 2017-11-01 DIAGNOSIS — Z95828 Presence of other vascular implants and grafts: Secondary | ICD-10-CM | POA: Insufficient documentation

## 2017-11-01 DIAGNOSIS — G450 Vertebro-basilar artery syndrome: Secondary | ICD-10-CM | POA: Insufficient documentation

## 2017-11-01 LAB — POCT I-STAT CREATININE: CREATININE: 1 mg/dL (ref 0.61–1.24)

## 2017-11-01 MED ORDER — IOPAMIDOL (ISOVUE-370) INJECTION 76%
INTRAVENOUS | Status: AC
  Administered 2017-11-01: 50 mL
  Filled 2017-11-01: qty 50

## 2017-11-13 ENCOUNTER — Telehealth (HOSPITAL_COMMUNITY): Payer: Self-pay

## 2017-11-13 NOTE — Telephone Encounter (Signed)
Called to schedule consult, left message for pt to return call. AW 

## 2017-11-16 ENCOUNTER — Other Ambulatory Visit (HOSPITAL_COMMUNITY): Payer: Self-pay | Admitting: Interventional Radiology

## 2017-11-16 ENCOUNTER — Ambulatory Visit: Payer: 59 | Admitting: Nurse Practitioner

## 2017-11-16 DIAGNOSIS — G450 Vertebro-basilar artery syndrome: Secondary | ICD-10-CM

## 2017-11-23 ENCOUNTER — Ambulatory Visit (HOSPITAL_COMMUNITY): Payer: Managed Care, Other (non HMO)

## 2017-12-07 ENCOUNTER — Ambulatory Visit (HOSPITAL_COMMUNITY)
Admission: RE | Admit: 2017-12-07 | Discharge: 2017-12-07 | Disposition: A | Discharge status: Home / Self Care | Payer: Managed Care, Other (non HMO) | Source: Ambulatory Visit | Attending: Interventional Radiology | Admitting: Interventional Radiology

## 2017-12-07 DIAGNOSIS — G450 Vertebro-basilar artery syndrome: Secondary | ICD-10-CM

## 2017-12-07 HISTORY — PX: IR RADIOLOGIST EVAL & MGMT: IMG5224

## 2017-12-10 ENCOUNTER — Encounter (HOSPITAL_COMMUNITY): Payer: Self-pay | Admitting: Interventional Radiology

## 2018-06-20 ENCOUNTER — Telehealth (HOSPITAL_COMMUNITY): Payer: Self-pay

## 2018-06-20 NOTE — Telephone Encounter (Signed)
Called to schedule consult, no answer, left vm. AW  

## 2018-07-02 ENCOUNTER — Other Ambulatory Visit: Payer: Self-pay | Admitting: Orthopedic Surgery

## 2018-07-02 DIAGNOSIS — M25312 Other instability, left shoulder: Secondary | ICD-10-CM

## 2018-07-02 DIAGNOSIS — M7542 Impingement syndrome of left shoulder: Secondary | ICD-10-CM

## 2018-07-19 ENCOUNTER — Ambulatory Visit
Admission: RE | Admit: 2018-07-19 | Discharge: 2018-07-19 | Disposition: A | Discharge status: Home / Self Care | Payer: Managed Care, Other (non HMO) | Source: Ambulatory Visit | Attending: Orthopedic Surgery | Admitting: Orthopedic Surgery

## 2018-07-19 DIAGNOSIS — M7542 Impingement syndrome of left shoulder: Secondary | ICD-10-CM | POA: Insufficient documentation

## 2018-07-19 DIAGNOSIS — M659 Synovitis and tenosynovitis, unspecified: Secondary | ICD-10-CM | POA: Diagnosis not present

## 2018-07-19 DIAGNOSIS — M67412 Ganglion, left shoulder: Secondary | ICD-10-CM | POA: Insufficient documentation

## 2018-07-19 DIAGNOSIS — M7552 Bursitis of left shoulder: Secondary | ICD-10-CM | POA: Insufficient documentation

## 2018-07-19 DIAGNOSIS — M25312 Other instability, left shoulder: Secondary | ICD-10-CM | POA: Insufficient documentation

## 2018-08-05 ENCOUNTER — Other Ambulatory Visit (HOSPITAL_COMMUNITY): Payer: Self-pay | Admitting: Interventional Radiology

## 2018-08-05 DIAGNOSIS — I771 Stricture of artery: Secondary | ICD-10-CM

## 2018-08-16 ENCOUNTER — Ambulatory Visit (HOSPITAL_COMMUNITY): Payer: Managed Care, Other (non HMO)

## 2018-08-23 ENCOUNTER — Encounter (HOSPITAL_COMMUNITY): Payer: Self-pay

## 2018-08-23 ENCOUNTER — Ambulatory Visit (HOSPITAL_COMMUNITY)
Admission: RE | Admit: 2018-08-23 | Discharge: 2018-08-23 | Disposition: A | Discharge status: Home / Self Care | Payer: Managed Care, Other (non HMO) | Source: Ambulatory Visit | Attending: Interventional Radiology | Admitting: Interventional Radiology

## 2018-08-23 DIAGNOSIS — I771 Stricture of artery: Secondary | ICD-10-CM

## 2018-08-23 NOTE — Progress Notes (Signed)
Chief Complaint: Patient was seen in consultation today for right ICA stenosis s/p revascularization and right vertebral artery stenosis s/p revascularization.  Referring Physician(s): Rejeana Brock  Supervising Physician: Julieanne Cotton  Patient Status: [facility] - Out-pt  History of Present Illness: Erik [name] is a 62 y.o. male with a past medical history of hypertension, hyperlipidemia, CVA 02/2017, GERD, nocturia, BPH, erectile dysfunction, hypogonadism, testosterone overdose, vitamin D deficiency, diabetes mellitus, arthritis (CPPD), anxiety, and depression. He is known to [facility] and has been followed by [place] since 02/2017. He first presented to our department as a code stroke and underwent an emergent image-guided cerebral angiogram with mechanical thrombectomy of left MCA M1 segment occlusion achieving a TICI 3 revascularization along with revascularization of left ICA occlusion using balloon angioplasty 02/16/2017 by Dr. Loreta Ave. He was discharged to inpatient rehab 02/22/2017 and discharged home 03/02/2017. He underwent an image-guided cerebral angiogram with revascularization of his right ICA using stent assisted angioplasty along with rescue thrombolysis of his intra-stent stenosis 03/07/2017 by Dr. Corliss Skains. He then underwent an image-guided cerebral angiogram with revascularization of his right vertebral artery stenosis using stent assisted angioplasty 06/20/2017 by Dr. Corliss Skains. He has been followed by routine imaging scans since. He last consulted with Dr. Corliss Skains 12/07/2017. At that time, it was decided that the best course of management would be with routine imaging scans in 6 months. Unfortunately, this imaging scan was denied by his insurance until he reconsulted with Dr. Corliss Skains.  Patient presents today for consultation to discuss management of his right ICA stenosis s/p revascularization and right vertebral artery stenosis s/p revascularization. Patient awake and alert  sitting in chair. Accompanied by wife. Complains of right-sided weakness from prior stroke. States this has improved since CVA 02/2017. Complains of generalized fatigue since CVA 02/2017, stable at this time. States that this mainly occurs at the end of his 10 hour work day. Complains of intermittent dizziness that occurs when he changes positions. States that he "gets up too fast" and feels "wobbly" when he does. Denies syncope or vision changes associated with dizziness. Complains of speech difficulty from prior stroke. States this has improved since CVA 02/2017. Patient's wife states that he "doesn't take time to talk slowly". Denies headache, numbness/tingling, vision changes, hearing changes, or tinnitus.  Patient is currently taking Brilinta 90 mg twice daily and Aspirin 81 mg once daily.   Past Medical History:  Diagnosis Date  . Anxiety    "stress"  . Arthritis    hands  . BPH (benign prostatic hyperplasia)   . Depression   . 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 ED (erectile dysfunction)   . GERD (gastroesophageal reflux disease)   . H/O calcium pyrophosphate deposition disease (CPPD)   . HLD (hyperlipidemia)   . Hypertension   . Hypogonadism in male   . Lower urinary tract symptoms (LUTS)   . Nocturia   . Stroke [facility])    Language Impairment- 06/19/17- slurres some, improves when he slows dow.  Right side weakness- improved  . Testosterone overdose   . Vitamin D deficiency     Past Surgical History:  Procedure Laterality Date  . BACK SURGERY  1986  . COLON SURGERY     Colon Resection  . COLONOSCOPY    . IR ANGIO INTRA EXTRACRAN SEL COM CAROTID INNOMINATE BILAT MOD SED  06/14/2017  . IR ANGIO INTRA EXTRACRAN SEL COM CAROTID INNOMINATE UNI L MOD SED  03/07/2017  . IR ANGIO INTRA  EXTRACRAN SEL COM CAROTID INNOMINATE UNI R MOD SED  02/16/2017  . IR ANGIO VERTEBRAL SEL SUBCLAVIAN INNOMINATE UNI L MOD SED  03/07/2017  . IR ANGIO VERTEBRAL SEL  SUBCLAVIAN INNOMINATE UNI L MOD SED  06/14/2017  . IR ANGIO VERTEBRAL SEL VERTEBRAL UNI R MOD SED  03/07/2017  . IR ANGIO VERTEBRAL SEL VERTEBRAL UNI R MOD SED  06/14/2017  . IR INFUSION THROMBOL ARTERIAL INITIAL (MS)  03/07/2017  . IR INTRAVSC STENT CERV CAROTID W/EMB-PROT MOD SED INCL ANGIO  03/07/2017  . IR PERCUTANEOUS ART THROMBECTOMY/INFUSION INTRACRANIAL INC DIAG ANGIO  02/16/2017  . IR PTA NON CORO-LOWER EXTREM  02/16/2017  . IR RADIOLOGIST EVAL & MGMT  04/04/2017  . IR RADIOLOGIST EVAL & MGMT  07/05/2017  . IR RADIOLOGIST EVAL & MGMT  12/07/2017  . IR TRANSCATH EXCRAN VERT OR CAR A STENT  06/20/2017  . IR US GUIDE VASC ACCESS LEFT  02/16/2017  . IR US GUIDE VASC ACCESS RIGHT  02/16/2017  . KNEE ARTHROSCOPY Bilateral   . RADIOLOGY WITH ANESTHESIA N/A 02/16/2017   Procedure: RADIOLOGY WITH ANESTHESIA;  Surgeon: Julieanne Cotton, MD;  Location: [facility] OR;  Service: Radiology;  Laterality: N/A;  . RADIOLOGY WITH ANESTHESIA N/A 03/07/2017   Procedure: CAROTID STENT;  Surgeon: Julieanne Cotton, MD;  Location: [facility] OR;  Service: Radiology;  Laterality: N/A;  . RADIOLOGY WITH ANESTHESIA N/A 06/20/2017   Procedure: RADIOLOGY WITH ANESTHESIA STENTING;  Surgeon: Julieanne Cotton, MD;  Location: [facility] OR;  Service: Radiology;  Laterality: N/A;  . ROTATOR CUFF REPAIR Right    x 2    Allergies: Shellfish-derived products and Celecoxib  Medications: Prior to Admission medications   Medication Sig Start Date End Date Taking? Authorizing Provider  acetaminophen (TYLENOL) 325 MG tablet Take 2 tablets (650 mg total) by mouth every 4 (four) hours as needed for mild pain. 02/28/17   LoveEvlyn Kanner, PA-C  aspirin 81 MG tablet Take 1 tablet (81 mg total) by mouth daily. 04/17/17   Deveshwar, Simonne Maffucci, MD  CRESTOR 40 MG tablet Take 1 tablet (40 mg total) by mouth daily. 03/02/17   Love, Evlyn Kanner, PA-C  FLUoxetine (PROZAC) 20 MG capsule Take 1 capsule (20 mg total) by mouth daily. 03/02/17   Love, Evlyn Kanner, PA-C    hydrochlorothiazide (HYDRODIURIL) 25 MG tablet Take 1 tablet (25 mg total) by mouth daily. 03/02/17   Love, Evlyn Kanner, PA-C  losartan (COZAAR) 25 MG tablet Take 1 tablet (25 mg total) by mouth daily. 03/02/17   Love, Evlyn Kanner, PA-C  Melatonin 3 MG TABS Take 1 tablet (3 mg total) by mouth at bedtime as needed (sleep). 03/02/17   Love, Evlyn Kanner, PA-C  metFORMIN (GLUCOPHAGE) 500 MG tablet Take 1 tablet (500 mg total) by mouth daily with breakfast. 03/03/17   Love, Evlyn Kanner, PA-C  Multiple Vitamin (MULTIVITAMIN) tablet Take 1 tablet by mouth daily.    [provider]  pantoprazole (PROTONIX) 40 MG tablet Take 1 tablet (40 mg total) by mouth at bedtime. 03/02/17   Love, Evlyn Kanner, PA-C  tamsulosin (FLOMAX) 0.4 MG CAPS capsule Take 1 capsule (0.4 mg total) by mouth daily after supper. 03/02/17   Love, Evlyn Kanner, PA-C  ticagrelor (BRILINTA) 90 MG TABS tablet Take 1 tablet (90 mg total) by mouth 2 (two) times daily. 03/08/17   Barnetta Chapel, PA-C     Family History  Problem Relation Age of Onset  . Diabetes Mother   . Hypertension Mother   . Dementia Mother   .  Malig Hypertension Mother   . Hyperlipidemia Mother   . Heart Problems Mother   . Heart Problems Brother     Social History   Socioeconomic History  . Marital status: Married    Spouse name: Not on file  . Number of children: Not on file  . Years of education: Not on file  . Highest education level: Not on file  Occupational History  . Not on file  Social Needs  . Financial resource strain: Not on file  . Food insecurity:    Worry: Not on file    Inability: Not on file  . Transportation needs:    Medical: Not on file    Non-medical: Not on file  Tobacco Use  . Smoking status: Never Smoker  . Smokeless tobacco: Never Used  Substance and Sexual Activity  . Alcohol use: Yes    Alcohol/week: 1.0 standard drinks    Types: 1 Cans of beer per week    Comment: drinks every day vodka with water 8//14/18 drinks a beer ocassional    . Drug use: No  . Sexual activity: Yes  Lifestyle  . Physical activity:    Days per week: Not on file    Minutes per session: Not on file  . Stress: Not on file  Relationships  . Social connections:    Talks on phone: Not on file    Gets together: Not on file    Attends religious service: Not on file    Active member of club or organization: Not on file    Attends meetings of clubs or organizations: Not on file    Relationship status: Not on file  Other Topics Concern  . Not on file  Social History Narrative  . Not on file     Review of Systems: A 12 point ROS discussed and pertinent positives are indicated in the HPI above.  All other systems are negative.  Review of Systems  Constitutional: Negative for chills and fever.  HENT: Negative for hearing loss and tinnitus.   Eyes: Negative for visual disturbance.  Respiratory: Negative for shortness of breath and wheezing.   Cardiovascular: Negative for chest pain and palpitations.  Neurological: Positive for dizziness, speech difficulty and weakness. Negative for syncope, numbness and headaches.  Psychiatric/Behavioral: Negative for behavioral problems and confusion.    Vital Signs: There were no vitals taken for this visit.  Physical Exam  Constitutional: He is oriented to person, place, and time. He appears well-developed and well-nourished. No distress.  Pulmonary/Chest: Effort normal. No respiratory distress.  Neurological: He is alert and oriented to person, place, and time.  Skin: Skin is warm and dry.  Psychiatric: He has a normal mood and affect. His behavior is normal. Judgment and thought content normal.     Imaging: No results found.  Labs:  CBC: No results for input(s): WBC, HGB, HCT, PLT in the last 8760 hours.  COAGS: No results for input(s): INR, APTT in the last 8760 hours.  BMP: Recent Labs    11/01/17 1357  CREATININE 1.00    LIVER FUNCTION TESTS: No results for input(s): BILITOT, AST,  ALT, ALKPHOS, PROT, ALBUMIN in the last 8760 hours.  TUMOR MARKERS: No results for input(s): AFPTM, CEA, CA199, CHROMGRNA in the last 8760 hours.  Assessment and Plan:  Right ICA stenosis s/p revascularization using stent assisted angioplasty 03/07/2017 by Dr. Corliss Skains. Right vertebral artery stenosis s/p revascularization using stent assisted angioplasty 06/20/2017 by Dr. Corliss Skains. Reviewed imaging with patient and wife.  First brought to their attention was patient's right ICA stenosis, both prior to and following intervention. Next brought to their attention was patient's right vertebral artery stenosis, both prior to and following intervention. Explained that they look stable on imaging, however most recent studies are from 2018. Explained that the best course of management at this time is with a current imaging scan to monitor for changes.  Discussed patient's current symptoms. Explained that a current imaging scan could help evaluate patient's new symptom of dizziness. Instructed patient to call us immediately if he notices worsening vertigo or gait disturbances.  Patient states that he recently tore his rotator cuff. He is planned for surgery in 10/2018. He has questions regarding holding his antiplatelet therapy. Informed patient that it is up to his surgeon which medication to hold, however we recommend that he stays on at least one antiplatelet (either Brilinta or Aspirin) and hold the other for surgery. Instructed patient to let us know what his surgeon has planned.  Plan for follow-up with CTA head/neck (with contrast) ASAP. Informed patient that our schedulers will call him to set up this imaging scan. Instructed patient to continue taking Brilinta 90 mg twice daily and Aspirin 81 mg once daily.  All questions answered and concerns addressed. Patient and wife convey understanding and agree with plan.  Thank you for this interesting consult.  I greatly enjoyed meeting Erik [name]  and look forward to participating in their care.  A copy of this report was sent to the requesting provider on this date.  Electronically Signed: Elwin Mocha, PA-C 08/23/2018, 10:14 AM   I spent a total of 25 Minutes in face to face in clinical consultation, greater than 50% of which was counseling/coordinating care for right ICA stenosis s/p revascularization AND right vertebral artery stenosis s/p revascularization.

## 2018-10-11 ENCOUNTER — Encounter
Admission: RE | Admit: 2018-10-11 | Discharge: 2018-10-11 | Disposition: A | Discharge status: Home / Self Care | Payer: Managed Care, Other (non HMO) | Source: Ambulatory Visit | Attending: Unknown Physician Specialty | Admitting: Unknown Physician Specialty

## 2018-10-11 ENCOUNTER — Other Ambulatory Visit: Payer: Self-pay

## 2018-10-11 DIAGNOSIS — Z01818 Encounter for other preprocedural examination: Secondary | ICD-10-CM | POA: Diagnosis not present

## 2018-10-11 NOTE — Patient Instructions (Signed)
Your procedure is scheduled on: Tues 12/17 Report to Day Surgery. To find out your arrival time please call [phone] between 1PM - 3PM on Mon. 12/16.  Remember: Instructions that are not followed completely may result in serious medical risk,  up to and including death, or upon the discretion of your surgeon and anesthesiologist your  surgery may need to be rescheduled.     _X__ 1. Do not eat food after midnight the night before your procedure.                 No gum chewing or hard candies. You may drink clear liquids up to 2 hours                 before you are scheduled to arrive for your surgery- DO not drink clear                 liquids within 2 hours of the start of your surgery.                 Clear Liquids include:  water,   __X__2.  On the morning of surgery brush your teeth with toothpaste and water, you                may rinse your mouth with mouthwash if you wish.  Do not swallow any toothpaste of mouthwash.     _X__ 3.  No Alcohol for 24 hours before or after surgery.   ___ 4.  Do Not Smoke or use e-cigarettes For 24 Hours Prior to Your Surgery.                 Do not use any chewable tobacco products for at least 6 hours prior to                 surgery.  ____  5.  Bring all medications with you on the day of surgery if instructed.   __x__  6.  Notify your doctor if there is any change in your medical condition      (cold, fever, infections).     Do not wear jewelry, make-up, hairpins, clips or nail polish. Do not wear lotions, powders, or perfumes. You may wear deodorant. Do not shave 48 hours prior to surgery. Men may shave face and neck. Do not bring valuables to the hospital.    [facility] any belongings or valuables.  Contacts, dentures or bridgework may not be worn into surgery. Leave your suitcase in the car. After surgery it may be brought to your room. For patients admitted to the hospital, discharge  time is determined by your treatment team.   Patients discharged the day of surgery will not be allowed to drive home.   Please read over the following fact sheets that you were given:     __x__ Take these medicines the morning of surgery with A SIP OF WATER:    1.pantoprazole (PROTONIX) 40 MG tablet  2. FLUoxetine (PROZAC) 20 MG capsule if needed  3. Tramadol if needed  4.  5.  6.  ____ Fleet Enema (as directed)   __x__ Use CHG Soap as directed  ____ Use inhalers on the day of surgery  __x__ Stop metformin 2 days prior to surgery Last dose on 12/14    ____ Take 1/2 of usual insulin dose the night before surgery. No insulin the morning          of surgery.  __x__ Stop  ticagrelor (BRILINTA) 90 MG TABS tablet  Last dose on 12/9  Continue the aspirin  ____ Stop Anti-inflammatories on    ____ Stop supplements until after surgery.    ____ Bring C-Pap to the hospital.

## 2018-10-22 ENCOUNTER — Ambulatory Visit: Payer: Managed Care, Other (non HMO) | Admitting: Anesthesiology

## 2018-10-22 ENCOUNTER — Ambulatory Visit
Admission: RE | Admit: 2018-10-22 | Discharge: 2018-10-22 | Disposition: A | Discharge status: Home / Self Care | Payer: Managed Care, Other (non HMO) | Source: Ambulatory Visit | Attending: Unknown Physician Specialty | Admitting: Unknown Physician Specialty

## 2018-10-22 ENCOUNTER — Other Ambulatory Visit: Payer: Self-pay

## 2018-10-22 ENCOUNTER — Encounter
Admission: RE | Disposition: A | Discharge status: Home / Self Care | Payer: Self-pay | Source: Ambulatory Visit | Attending: Unknown Physician Specialty

## 2018-10-22 DIAGNOSIS — Z79899 Other long term (current) drug therapy: Secondary | ICD-10-CM | POA: Insufficient documentation

## 2018-10-22 DIAGNOSIS — Z7984 Long term (current) use of oral hypoglycemic drugs: Secondary | ICD-10-CM | POA: Insufficient documentation

## 2018-10-22 DIAGNOSIS — M19012 Primary osteoarthritis, left shoulder: Secondary | ICD-10-CM | POA: Insufficient documentation

## 2018-10-22 DIAGNOSIS — K219 Gastro-esophageal reflux disease without esophagitis: Secondary | ICD-10-CM | POA: Insufficient documentation

## 2018-10-22 DIAGNOSIS — M7522 Bicipital tendinitis, left shoulder: Secondary | ICD-10-CM | POA: Diagnosis not present

## 2018-10-22 DIAGNOSIS — F329 Major depressive disorder, single episode, unspecified: Secondary | ICD-10-CM | POA: Diagnosis not present

## 2018-10-22 DIAGNOSIS — I10 Essential (primary) hypertension: Secondary | ICD-10-CM | POA: Diagnosis not present

## 2018-10-22 DIAGNOSIS — I739 Peripheral vascular disease, unspecified: Secondary | ICD-10-CM | POA: Insufficient documentation

## 2018-10-22 DIAGNOSIS — E119 Type 2 diabetes mellitus without complications: Secondary | ICD-10-CM | POA: Insufficient documentation

## 2018-10-22 DIAGNOSIS — F419 Anxiety disorder, unspecified: Secondary | ICD-10-CM | POA: Insufficient documentation

## 2018-10-22 HISTORY — PX: SHOULDER ARTHROSCOPY WITH ROTATOR CUFF REPAIR AND SUBACROMIAL DECOMPRESSION: SHX5686

## 2018-10-22 LAB — GLUCOSE, CAPILLARY
Glucose-Capillary: 98 mg/dL (ref 70–99)
Glucose-Capillary: 136 mg/dL — ABNORMAL HIGH (ref 70–99)
Glucose-Capillary: 144 mg/dL — ABNORMAL HIGH (ref 70–99)

## 2018-10-22 SURGERY — SHOULDER ARTHROSCOPY WITH ROTATOR CUFF REPAIR AND SUBACROMIAL DECOMPRESSION
Anesthesia: General | Site: Shoulder | Laterality: Left

## 2018-10-22 MED ORDER — CEFAZOLIN SODIUM-DEXTROSE 2-3 GM-%(50ML) IV SOLR
INTRAVENOUS | Status: DC | PRN
  Administered 2018-10-22: 2 g via INTRAVENOUS

## 2018-10-22 MED ORDER — EPHEDRINE SULFATE 50 MG/ML IJ SOLN
INTRAMUSCULAR | Status: AC
  Filled 2018-10-22: qty 1

## 2018-10-22 MED ORDER — LIDOCAINE HCL (PF) 1 % IJ SOLN
INTRAMUSCULAR | Status: AC
  Filled 2018-10-22: qty 5

## 2018-10-22 MED ORDER — EPINEPHRINE PF 1 MG/ML IJ SOLN
INTRAMUSCULAR | Status: DC | PRN
  Administered 2018-10-22: 4 mL

## 2018-10-22 MED ORDER — FENTANYL CITRATE (PF) 100 MCG/2ML IJ SOLN
INTRAMUSCULAR | Status: AC
  Filled 2018-10-22: qty 2

## 2018-10-22 MED ORDER — LIDOCAINE HCL (CARDIAC) PF 100 MG/5ML IV SOSY
PREFILLED_SYRINGE | INTRAVENOUS | Status: DC | PRN
  Administered 2018-10-22: 100 mg via INTRAVENOUS

## 2018-10-22 MED ORDER — NORCO 5-325 MG PO TABS: 1.0000 | ORAL_TABLET | ORAL | 0 refills | Status: AC | PRN

## 2018-10-22 MED ORDER — BUPIVACAINE HCL (PF) 0.5 % IJ SOLN
INTRAMUSCULAR | Status: AC
  Filled 2018-10-22: qty 10

## 2018-10-22 MED ORDER — BUPIVACAINE HCL (PF) 0.5 % IJ SOLN
INTRAMUSCULAR | Status: DC | PRN
  Administered 2018-10-22: 10 mL via PERINEURAL

## 2018-10-22 MED ORDER — EPHEDRINE SULFATE 50 MG/ML IJ SOLN
INTRAMUSCULAR | Status: DC | PRN
  Administered 2018-10-22: 10 mg via INTRAVENOUS
  Administered 2018-10-22 (×3): 5 mg via INTRAVENOUS

## 2018-10-22 MED ORDER — FENTANYL CITRATE (PF) 100 MCG/2ML IJ SOLN
50.0000 ug | Freq: Once | INTRAMUSCULAR | Status: AC
  Administered 2018-10-22: 50 ug via INTRAVENOUS

## 2018-10-22 MED ORDER — BUPIVACAINE LIPOSOME 1.3 % IJ SUSP
INTRAMUSCULAR | Status: AC
  Filled 2018-10-22: qty 20

## 2018-10-22 MED ORDER — LIDOCAINE HCL (PF) 1 % IJ SOLN
INTRAMUSCULAR | Status: DC | PRN
  Administered 2018-10-22: 3 mL

## 2018-10-22 MED ORDER — ONDANSETRON HCL 4 MG/2ML IJ SOLN
INTRAMUSCULAR | Status: AC
  Filled 2018-10-22: qty 2

## 2018-10-22 MED ORDER — BUPIVACAINE LIPOSOME 1.3 % IJ SUSP
INTRAMUSCULAR | Status: DC | PRN
  Administered 2018-10-22: 20 mL via PERINEURAL

## 2018-10-22 MED ORDER — PROPOFOL 10 MG/ML IV BOLUS
INTRAVENOUS | Status: AC
  Filled 2018-10-22: qty 20

## 2018-10-22 MED ORDER — ONDANSETRON HCL 4 MG/2ML IJ SOLN
INTRAMUSCULAR | Status: DC | PRN
  Administered 2018-10-22: 4 mg via INTRAVENOUS

## 2018-10-22 MED ORDER — DEXAMETHASONE SODIUM PHOSPHATE 10 MG/ML IJ SOLN
INTRAMUSCULAR | Status: DC | PRN
  Administered 2018-10-22: 5 mg via INTRAVENOUS

## 2018-10-22 MED ORDER — EPINEPHRINE 30 MG/30ML IJ SOLN
INTRAMUSCULAR | Status: AC
  Filled 2018-10-22: qty 1

## 2018-10-22 MED ORDER — SODIUM CHLORIDE 0.9 % IV SOLN
INTRAVENOUS | Status: DC
  Administered 2018-10-22: 12:00:00 via INTRAVENOUS

## 2018-10-22 MED ORDER — MIDAZOLAM HCL 2 MG/2ML IJ SOLN
INTRAMUSCULAR | Status: AC
  Filled 2018-10-22: qty 2

## 2018-10-22 MED ORDER — CEFAZOLIN SODIUM 1 G IJ SOLR
INTRAMUSCULAR | Status: AC
  Filled 2018-10-22: qty 10

## 2018-10-22 MED ORDER — MIDAZOLAM HCL 2 MG/2ML IJ SOLN
1.0000 mg | Freq: Once | INTRAMUSCULAR | Status: AC
  Administered 2018-10-22: 1 mg via INTRAVENOUS

## 2018-10-22 MED ORDER — SUGAMMADEX SODIUM 200 MG/2ML IV SOLN
INTRAVENOUS | Status: DC | PRN
  Administered 2018-10-22: 200 mg via INTRAVENOUS

## 2018-10-22 MED ORDER — FENTANYL CITRATE (PF) 100 MCG/2ML IJ SOLN
INTRAMUSCULAR | Status: DC | PRN
  Administered 2018-10-22: 50 ug via INTRAVENOUS

## 2018-10-22 MED ORDER — SODIUM CHLORIDE (PF) 0.9 % IJ SOLN
INTRAMUSCULAR | Status: AC
  Filled 2018-10-22: qty 20

## 2018-10-22 MED ORDER — CEFAZOLIN SODIUM 1 G IJ SOLR
INTRAMUSCULAR | Status: AC
  Filled 2018-10-22: qty 20

## 2018-10-22 MED ORDER — ROCURONIUM BROMIDE 100 MG/10ML IV SOLN
INTRAVENOUS | Status: DC | PRN
  Administered 2018-10-22: 50 mg via INTRAVENOUS

## 2018-10-22 MED ORDER — ROCURONIUM BROMIDE 50 MG/5ML IV SOLN
INTRAVENOUS | Status: AC
  Filled 2018-10-22: qty 1

## 2018-10-22 MED ORDER — LIDOCAINE HCL (PF) 2 % IJ SOLN
INTRAMUSCULAR | Status: AC
  Filled 2018-10-22: qty 10

## 2018-10-22 MED ORDER — PROPOFOL 10 MG/ML IV BOLUS
INTRAVENOUS | Status: DC | PRN
  Administered 2018-10-22: 130 mg via INTRAVENOUS

## 2018-10-22 MED ORDER — SUGAMMADEX SODIUM 200 MG/2ML IV SOLN
INTRAVENOUS | Status: AC
  Filled 2018-10-22: qty 2

## 2018-10-22 SURGICAL SUPPLY — 72 items
ADAPTER IRRIG TUBE 2 SPIKE SOL (ADAPTER) ×5 IMPLANT
ARTHROWAND PARAGON T2 (SURGICAL WAND)
BLADE ABRADER 4.5 (BLADE) ×3 IMPLANT
BLADE SHAVER 4.5X7 STR FR (MISCELLANEOUS) ×3 IMPLANT
BLADE SURG 15 STRL LF DISP TIS (BLADE) IMPLANT
BLADE SURG 15 STRL SS (BLADE)
BUR ABRADER 5.5 BLK (MISCELLANEOUS) ×1 IMPLANT
BUR BR 5.5 WIDE MOUTH (BURR) ×3 IMPLANT
BURR ABRADER 5.5 BLK (MISCELLANEOUS) ×3
CANNULA 8.5X75 THRED (CANNULA) IMPLANT
CANNULA SHAVER 8MMX76MM (CANNULA) IMPLANT
CHLORAPREP W/TINT 26ML (MISCELLANEOUS) ×6 IMPLANT
COVER WAND RF STERILE (DRAPES) ×3 IMPLANT
DRAPE STERI 35X30 U-POUCH (DRAPES) ×3 IMPLANT
DRAPE UTILITY 15X26 TOWEL STRL (DRAPES) ×4 IMPLANT
ELECT REM PT RETURN 9FT ADLT (ELECTROSURGICAL) ×3
ELECTRODE REM PT RTRN 9FT ADLT (ELECTROSURGICAL) ×1 IMPLANT
GAUZE SPONGE 4X4 12PLY STRL (GAUZE/BANDAGES/DRESSINGS) ×3 IMPLANT
GLOVE BIO SURGEON STRL SZ8 (GLOVE) ×3 IMPLANT
GLOVE BIOGEL M STRL SZ7.5 (GLOVE) ×3 IMPLANT
GLOVE INDICATOR 8.0 STRL GRN (GLOVE) ×3 IMPLANT
GOWN STRL REUS W/ TWL LRG LVL3 (GOWN DISPOSABLE) ×1 IMPLANT
GOWN STRL REUS W/TWL LRG LVL3 (GOWN DISPOSABLE) ×2
GOWN STRL REUS W/TWL LRG LVL4 (GOWN DISPOSABLE) ×3 IMPLANT
IV LACTATED RINGER IRRG 3000ML (IV SOLUTION) ×2
IV LR IRRIG 3000ML ARTHROMATIC (IV SOLUTION) ×1 IMPLANT
KIT SHOULDER TRACTION (DRAPES) ×3 IMPLANT
KIT SUTURE 2.8 Q-FIX DISP (MISCELLANEOUS) IMPLANT
MANIFOLD NEPTUNE II (INSTRUMENTS) ×3 IMPLANT
NDL MAYO CATGUT SZ 2 (NEEDLE) IMPLANT
NDL SAFETY ECLIPSE 18X1.5 (NEEDLE) IMPLANT
NDL SPNL 18GX3.5 QUINCKE PK (NEEDLE) IMPLANT
NEEDLE HYPO 18GX1.5 SHARP (NEEDLE)
NEEDLE MAYO CATGUT SZ 1.5 (NEEDLE)
NEEDLE MAYO CATGUT SZ 2 (NEEDLE) IMPLANT
NEEDLE SPNL 18GX3.5 QUINCKE PK (NEEDLE) IMPLANT
PACK ARTHROSCOPY SHOULDER (MISCELLANEOUS) ×3 IMPLANT
PASSER SUT CAPTURE FIRST (SUTURE) IMPLANT
SET TUBE SUCT SHAVER OUTFL 24K (TUBING) ×3 IMPLANT
SLING ARM M TX990204 (SOFTGOODS) ×2 IMPLANT
SOL PREP PVP 2OZ (MISCELLANEOUS) ×3
SOLUTION PREP PVP 2OZ (MISCELLANEOUS) ×1 IMPLANT
STAPLER SKIN PROX 35W (STAPLE) IMPLANT
SUT ETHIBOND NAB CT1 #1 30IN (SUTURE) IMPLANT
SUT ETHILON 3-0 FS-10 30 BLK (SUTURE) ×3
SUT PDS AB 1 CT1 27 (SUTURE) IMPLANT
SUT PROLENE 2 0 CT2 30 (SUTURE) IMPLANT
SUT SMART STITCH CARTRIDGE (SUTURE) IMPLANT
SUT TICRON 2-0 30IN 311381 (SUTURE) IMPLANT
SUT VIC AB 0 CT1 36 (SUTURE) IMPLANT
SUT VIC AB 0 CT2 27 (SUTURE) IMPLANT
SUT VIC AB 2-0 CT1 27 (SUTURE)
SUT VIC AB 2-0 CT1 TAPERPNT 27 (SUTURE) IMPLANT
SUT VIC AB 2-0 CT2 27 (SUTURE) IMPLANT
SUT VIC AB 2-0 SH 27 (SUTURE)
SUT VIC AB 2-0 SH 27XBRD (SUTURE) IMPLANT
SUT VIC AB 3-0 SH 27 (SUTURE)
SUT VIC AB 3-0 SH 27X BRD (SUTURE) IMPLANT
SUTURE EHLN 3-0 FS-10 30 BLK (SUTURE) IMPLANT
SUTURE MAGNUM WIRE 2X48 BLK (SUTURE) IMPLANT
SYR 10ML LL (SYRINGE) IMPLANT
TAPE MICROFOAM 4IN (TAPE) ×3 IMPLANT
TUBING ARTHRO INFLOW-ONLY STRL (TUBING) ×3 IMPLANT
WAND 30 DEG SABER W/CORD (SURGICAL WAND) IMPLANT
WAND ARTHRO PARAGON T2 (SURGICAL WAND) IMPLANT
WAND COVAC 50 IFS (MISCELLANEOUS) IMPLANT
WAND COVATOR 20 (MISCELLANEOUS) IMPLANT
WAND HAND CNTRL MULTIVAC 50 (MISCELLANEOUS) IMPLANT
WAND HAND CNTRL MULTIVAC 90 (MISCELLANEOUS) ×2 IMPLANT
WAND TOPAZ EPF  WAS Q (MISCELLANEOUS)
WAND TOPAZ EPF WAS Q (MISCELLANEOUS) IMPLANT
WAND TOPAZ MICRO DEBRIDER (MISCELLANEOUS) IMPLANT

## 2018-10-22 NOTE — Anesthesia Procedure Notes (Signed)
Anesthesia Regional Block: Interscalene brachial plexus block   Pre-Anesthetic Checklist: ,, timeout performed, Correct Patient, Correct Site, Correct Laterality, Correct Procedure, Correct Position, site marked, Risks and benefits discussed,  Surgical consent,  Pre-op evaluation,  At surgeon's request and post-op pain management  Laterality: Left  Prep: chloraprep       Needles:  Injection technique: Single-shot  Needle Type: Stimiplex     Needle Length: 10cm  Needle Gauge: 21     Additional Needles:   Procedures:,,,, ultrasound used (permanent image in chart),,,,  Narrative:  Start time: 10/22/2018 1:00 PM End time: 10/22/2018 1:05 PM Injection made incrementally with aspirations every 5 mL.  Performed by: Personally  Anesthesiologist: Alver FisherPenwarden, Mishka Stegemann, MD  Additional Notes: Functioning IV was confirmed and monitors were applied.  A Stimuplex needle was used. Sterile prep and drape,hand hygiene and sterile gloves were used.  Negative aspiration and negative test dose prior to incremental administration of local anesthetic. The patient tolerated the procedure well.

## 2018-10-22 NOTE — Op Note (Signed)
Expand All Collapse All   06/21/2015  1:32 PM  Patient:[name], Erik BlalockJames W.  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  Findings: As above.   Complications: None  Estimated blood loss: negligible  Tourniquet time: None  Drains: None   Brief clinical note: The patient's symptoms have progressed despite medications, activity modification, etc. The patient's history and examination are consistent with  glenohumeral degenerative changes plus long head of biceps tendinitis. These findings were confirmed by MRI scan. The patient presents at this time for definitive management of these shoulder symptoms.  Procedure: The patient was brought into the operating room and placed in the supine position. The patient then underwent general endotracheal intubation and anesthesia before being repositioned in the lateral decubitus position. The left shoulder was prepped and draped in usual fashion for shoulder procedure. The shoulder was supported with the Acufex shoulder suspension device. 10 pounds of traction was utilized. Preoperative antibiotics were administered. A timeout was performed . A posterior portal was created and the glenohumeral joint thoroughly inspected revealing moderate degenerative arthritis and a frayed labrum. There was a little fraying of the labral attachment of the long head of the biceps tendon.. An anterior portal was created. An ArthroCare wand was inserted and used to obtain hemostasis as well as to perform a limited synovectomy.The biceps tendon was evaluated and then released from its  labral attachment using an ArthroCare wand.  The glenohumeral joint was debrided with a turbo whisker.  The scope was repositioned through the posterior portal into the subacromial space. A separate lateral portal was created using an outside-in technique. An ArthroCare 90 wand followed by a 4.0 full-radius resector was introduced and used to perform a subtotal bursectomy. No obvious rotator cuff tear was appreciated.  The instruments were then removed from the subacromial space.  The portal sites were closed using 3-O nylon sutures. A sterile bulky dressing was applied to the shoulder . This was followed by a sling. The patient was then awakened, extubated, and returned to the recovery room in satisfactory condition after tolerating the procedure well.  Blood loss was negligible.

## 2018-10-22 NOTE — Anesthesia Post-op Follow-up Note (Signed)
Anesthesia QCDR form completed.        

## 2018-10-22 NOTE — Anesthesia Procedure Notes (Signed)
Procedure Name: MAC Date/Time: 10/22/2018 1:50 PM Performed by: Chanetta Marshall, CRNA Pre-anesthesia Checklist: Patient identified, Emergency Drugs available, Suction available and Patient being monitored Patient Re-evaluated:Patient Re-evaluated prior to induction Oxygen Delivery Method: Circle system utilized Preoxygenation: Pre-oxygenation with 100% oxygen Induction Type: IV induction Ventilation: Mask ventilation without difficulty Laryngoscope Size: Mac and 3 Grade View: Grade II Tube size: 7.5 mm Number of attempts: 1 Airway Equipment and Method: Stylet Placement Confirmation: ETT inserted through vocal cords under direct vision,  positive ETCO2,  CO2 detector and breath sounds checked- equal and bilateral Secured at: 22 cm Tube secured with: Tape Dental Injury: Teeth and Oropharynx as per pre-operative assessment

## 2018-10-22 NOTE — H&P (Signed)
  H and P reviewed. No changes. Uploaded at later date. 

## 2018-10-22 NOTE — Anesthesia Preprocedure Evaluation (Signed)
Anesthesia Evaluation  Patient identified by MRN, date of birth, ID band Patient awake    Reviewed: Allergy & Precautions, NPO status , Patient's Chart, lab work & pertinent test results  History of Anesthesia Complications Negative for: history of anesthetic complications  Airway Mallampati: III  TM Distance: >3 FB Neck ROM: Full    Dental no notable dental hx.    Pulmonary neg pulmonary ROS, neg sleep apnea, neg COPD,    breath sounds clear to auscultation- rhonchi (-) wheezing      Cardiovascular hypertension, Pt. on medications + Peripheral Vascular Disease  (-) CAD, (-) Past MI, (-) Cardiac Stents and (-) CABG  Rhythm:Regular Rate:Normal - Systolic murmurs and - Diastolic murmurs    Neuro/Psych neg Seizures PSYCHIATRIC DISORDERS Anxiety Depression CVA (speech abnormalities), Residual Symptoms    GI/Hepatic Neg liver ROS, GERD  ,  Endo/Other  diabetes, Oral Hypoglycemic Agents  Renal/GU negative Renal ROS     Musculoskeletal  (+) Arthritis ,   Abdominal (+) + obese,   Peds  Hematology  (+) anemia ,   Anesthesia Other Findings Past Medical History: No date: Anxiety     Comment:  "stress" No date: Arthritis     Comment:  hands No date: BPH (benign prostatic hyperplasia) No date: Depression 02/2017: Diabetes mellitus without complication (HCC)     Comment:  Type II No date: ED (erectile dysfunction) No date: GERD (gastroesophageal reflux disease) No date: H/O calcium pyrophosphate deposition disease (CPPD) No date: HLD (hyperlipidemia) No date: Hypertension No date: Hypogonadism in male No date: Lower urinary tract symptoms (LUTS) No date: Nocturia No date: Stroke [facility](HCC)     Comment:  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  Right side weakness- improved No date: Testosterone overdose No date: Vitamin D deficiency   Reproductive/Obstetrics                              Anesthesia Physical Anesthesia Plan  ASA: III  Anesthesia Plan: General   Post-op Pain Management:  Regional for Post-op pain   Induction: Intravenous  PONV Risk Score and Plan: 1 and Ondansetron and Midazolam  Airway Management Planned: Oral ETT  Additional Equipment:   Intra-op Plan:   Post-operative Plan: Extubation in OR  Informed Consent: I have reviewed the patients History and Physical, chart, labs and discussed the procedure including the risks, benefits and alternatives for the proposed anesthesia with the patient or authorized representative who has indicated his/her understanding and acceptance.   Dental advisory given  Plan Discussed with: CRNA and Anesthesiologist  Anesthesia Plan Comments:         Anesthesia Quick Evaluation

## 2018-10-22 NOTE — Discharge Instructions (Signed)
General Anesthesia, Care After Refer to this sheet in the next few weeks. These instructions provide you with information on caring for yourself after your procedure. Your health care provider may also give you more specific instructions. Your treatment has been planned according to current medical practices, but problems sometimes occur. Call your health care provider if you have any problems or questions after your procedure. WHAT TO EXPECT AFTER THE PROCEDURE After the procedure, it is typical to experience:  Sleepiness.  Nausea and vomiting. HOME CARE INSTRUCTIONS  For the first 24 hours after general anesthesia:  Have a responsible person with you.  Do not drive a car. If you are alone, do not take public transportation.  Do not drink alcohol.  Do not take medicine that has not been prescribed by your health care provider.  Do not sign important papers or make important decisions.  You may resume a normal diet and activities as directed by your health care provider.  Change bandages (dressings) as directed.  If you have questions or problems that seem related to general anesthesia, call the hospital and ask for the anesthetist or anesthesiologist on call. SEEK MEDICAL CARE IF:  You have nausea and vomiting that continue the day after anesthesia.  You develop a rash. SEEK IMMEDIATE MEDICAL CARE IF:   You have difficulty breathing.  You have chest pain.  You have any allergic problems.  Document Released: 01/29/2001 Document Revised: 10/28/2013 Document Reviewed: 05/08/2013 [facility] Patient Information 2015 [employer], [place]. This information is not intended to replace advice given to you by your health care provider. Make sure you discuss any questions you have with your health care provider. Diet: As you were doing prior to hospitalization   Dressing:  You may remove your dressing 3 days after surgery. Then apply waterproof bandaids. You can shower at this time but need  to change the waterproof bandaids after showering.  Activity:  Increase activity slowly as tolerated. Can drive when comfortable.    Sling: D/C when comfortable    RTC: 1 week  Ice pack as needed   To prevent constipation: you may use a stool softener such as - Miralax (over the counter) for constipation as needed.     Precautions:  If you experience chest pain or shortness of breath - call 911 immediately for transfer to the hospital emergency department!!  If you develop a fever greater that 101 F, purulent drainage from wound, increased redness or drainage from wound, or calf pain -- Call the office at [phone]                                             Follow- Up Appointment:  Please call for an appointment to be seen in 1 wk.

## 2018-10-22 NOTE — Transfer of Care (Signed)
Immediate Anesthesia Transfer of Care Note  Patient: Erik [name]  Procedure(s) Performed: SHOULDER ARTHROSCOPY, GELNOID HUMERAL DEDRIDEMENT, BICEPS TENOTOMY (Left Shoulder)  Patient Location: PACU  Anesthesia Type:General  Level of Consciousness: awake, alert  and oriented  Airway & Oxygen Therapy: Patient Spontanous Breathing and Patient connected to face mask oxygen  Post-op Assessment: Report given to RN and Post -op Vital signs reviewed and stable  Post vital signs: Reviewed and stable  Last Vitals:  Vitals Value Taken Time  BP 146/82 10/22/2018  3:33 PM  Temp 36.3 C 10/22/2018  3:33 PM  Pulse 79 10/22/2018  3:34 PM  Resp 13 10/22/2018  3:34 PM  SpO2 100 % 10/22/2018  3:34 PM  Vitals shown include unvalidated device data.  Last Pain:  Vitals:   10/22/18 1303  TempSrc:   PainSc: 0-No pain         Complications: No apparent anesthesia complications

## 2018-10-23 ENCOUNTER — Encounter: Payer: Self-pay | Admitting: Unknown Physician Specialty

## 2018-10-23 NOTE — Anesthesia Postprocedure Evaluation (Signed)
Anesthesia Post Note  Patient: Erik [name]  Procedure(s) Performed: SHOULDER ARTHROSCOPY, GELNOID HUMERAL DEDRIDEMENT, BICEPS TENOTOMY (Left Shoulder)  Patient location during evaluation: PACU Anesthesia Type: General Level of consciousness: awake and alert and oriented Pain management: pain level controlled Vital Signs Assessment: post-procedure vital signs reviewed and stable Respiratory status: spontaneous breathing, nonlabored ventilation and respiratory function stable Cardiovascular status: blood pressure returned to baseline and stable Postop Assessment: no signs of nausea or vomiting Anesthetic complications: no     Last Vitals:  Vitals:   10/22/18 1617 10/22/18 1645  BP: 108/68 118/68  Pulse: 80 84  Resp: 18 18  Temp: (!) 36.3 C 36.6 C  SpO2: 99% 96%    Last Pain:  Vitals:   10/22/18 1645  TempSrc: Temporal  PainSc: 0-No pain                 Syleena Mchan

## 2018-12-10 ENCOUNTER — Telehealth (HOSPITAL_COMMUNITY): Payer: Self-pay

## 2018-12-10 NOTE — Telephone Encounter (Signed)
Called to schedule f/u cta head/neck, no answer, left vm. AW 

## 2018-12-16 ENCOUNTER — Other Ambulatory Visit (HOSPITAL_COMMUNITY): Payer: Self-pay | Admitting: Interventional Radiology

## 2018-12-16 DIAGNOSIS — I771 Stricture of artery: Secondary | ICD-10-CM

## 2018-12-27 ENCOUNTER — Ambulatory Visit (HOSPITAL_COMMUNITY)
Admission: RE | Admit: 2018-12-27 | Discharge: 2018-12-27 | Disposition: A | Discharge status: Home / Self Care | Payer: Managed Care, Other (non HMO) | Source: Ambulatory Visit | Attending: Interventional Radiology | Admitting: Interventional Radiology

## 2018-12-27 DIAGNOSIS — I771 Stricture of artery: Secondary | ICD-10-CM

## 2018-12-27 LAB — POCT I-STAT CREATININE: Creatinine, Ser: 0.8 mg/dL (ref 0.61–1.24)

## 2018-12-27 MED ORDER — IOPAMIDOL (ISOVUE-370) INJECTION 76%
75.0000 mL | Freq: Once | INTRAVENOUS | Status: AC | PRN
  Administered 2018-12-27: 75 mL via INTRAVENOUS

## 2019-01-01 ENCOUNTER — Telehealth (HOSPITAL_COMMUNITY): Payer: Self-pay

## 2019-01-01 NOTE — Telephone Encounter (Signed)
Pt's wife agreed to f/u in 6 months with cta head/neck. AW  

## 2019-02-05 IMAGING — CT CT HEAD W/O CM
3 of 4 series · 16 of 47 positions shown, 19 images · non-contrast
Comparison: CTA head neck 02/16/2017

CLINICAL DATA: Left MCA stroke, status post intervention.

EXAM:
CT HEAD WITHOUT CONTRAST
TECHNIQUE: Contiguous axial images were obtained from the base of the skull
through the vertex without intravenous contrast.

[Series 201: head w/o, idose (1) · axial · non-contrast · 0.49mm/px · z∈[+117,+267]mm · 10 of 36 slices shown, 13 images]
[im 3/36  brain]
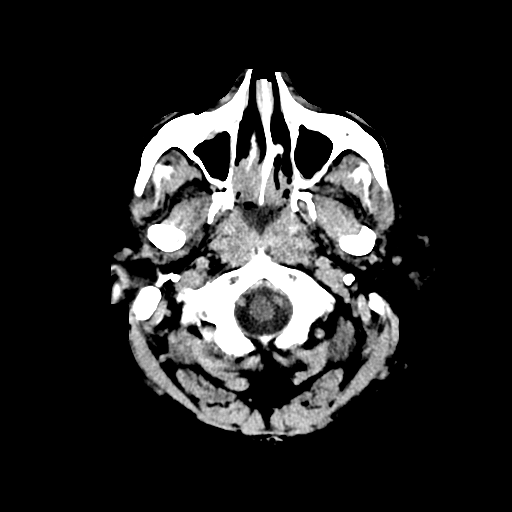
[im 3/36  bone]
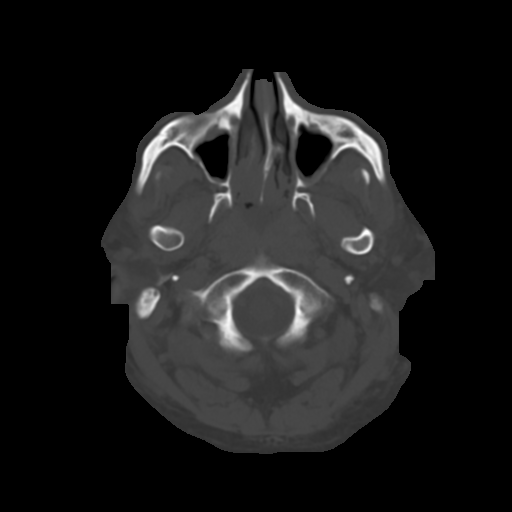
[im 6/36  brain]
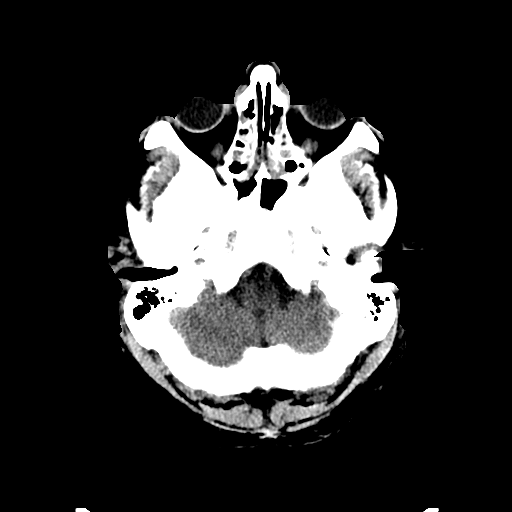
[im 11/36  brain]
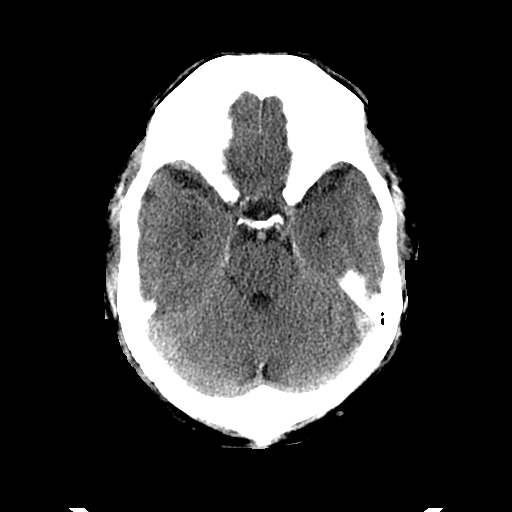
[im 13/36  brain]
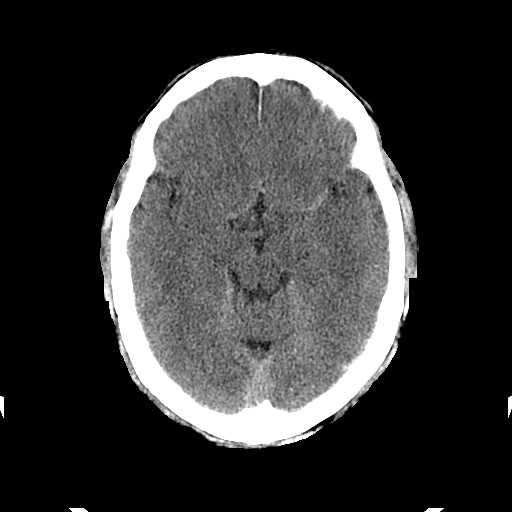
[im 16/36  brain]
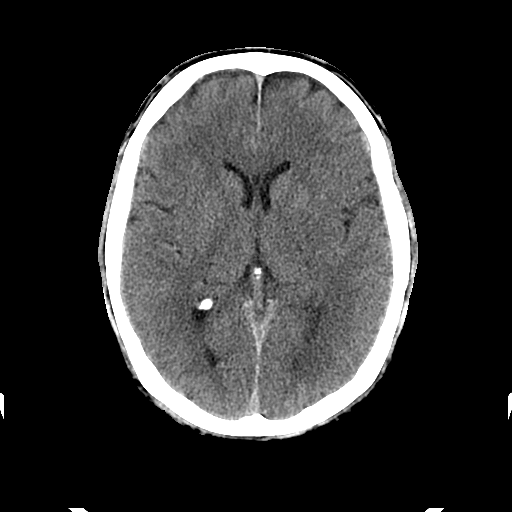
[im 16/36  bone]
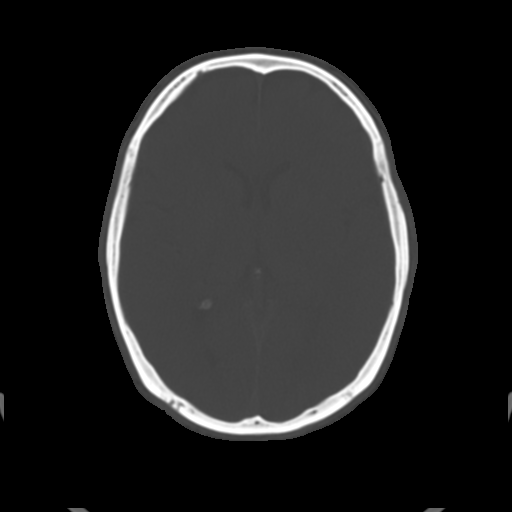
[im 21/36  brain]
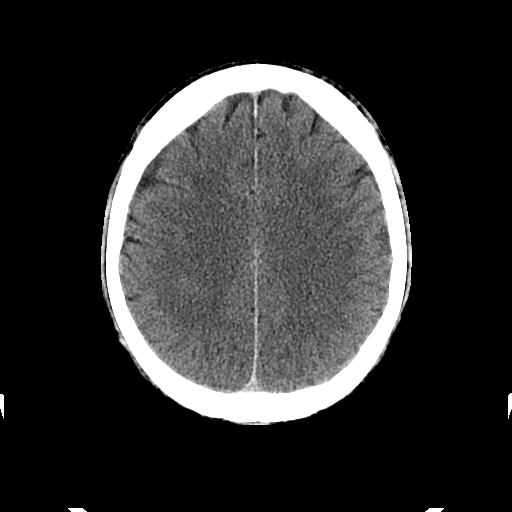
[im 23/36  brain]
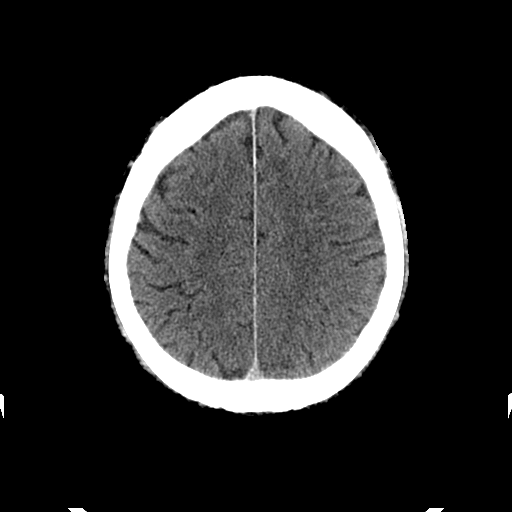
[im 26/36  brain]
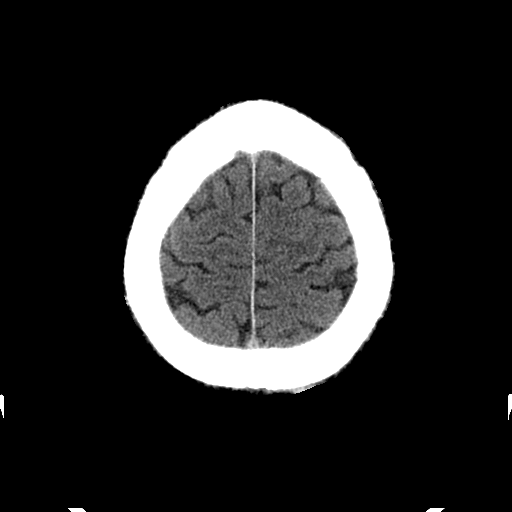
[im 31/36  brain]
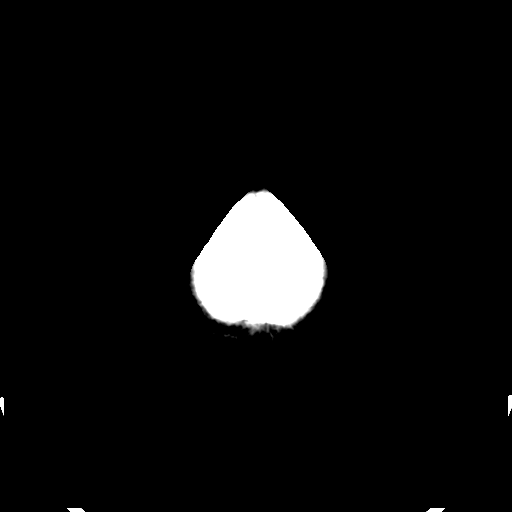
[im 31/36  bone]
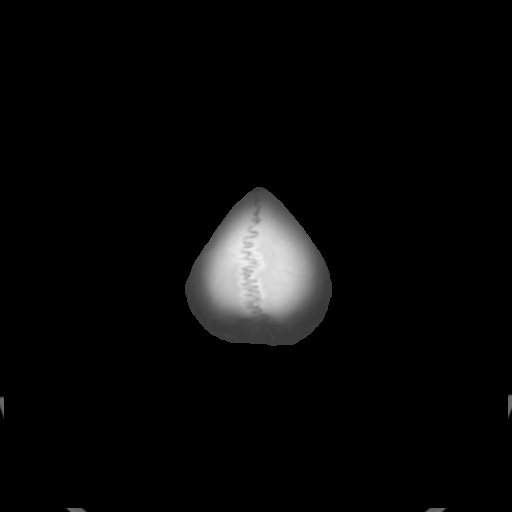
[im 33/36  brain]
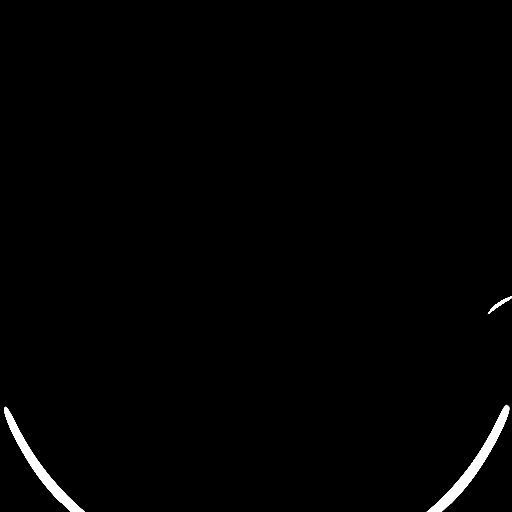

[Series 203: coronal st, idose (1) · coronal · 0.40mm/px · 3 of 78 slices shown]
[im 26/78  brain]
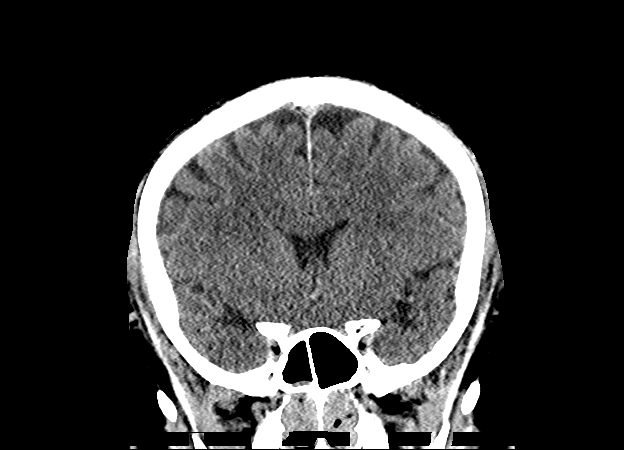
[im 35/78  brain]
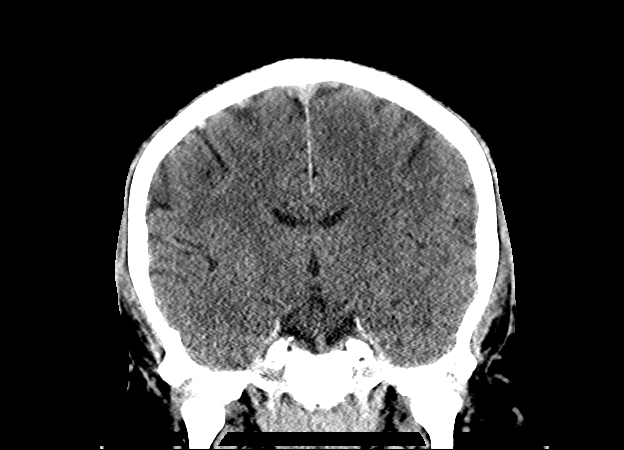
[im 43/78  brain]
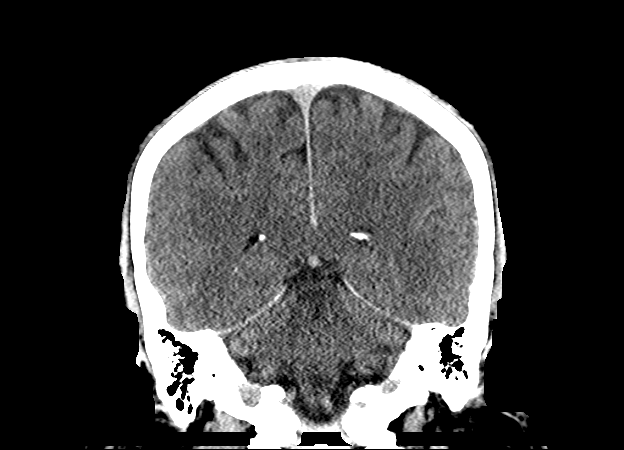

[Series 204: sagittal st, idose (1) · sagittal · 0.40mm/px · 3 of 83 slices shown]
[im 28/83  brain]
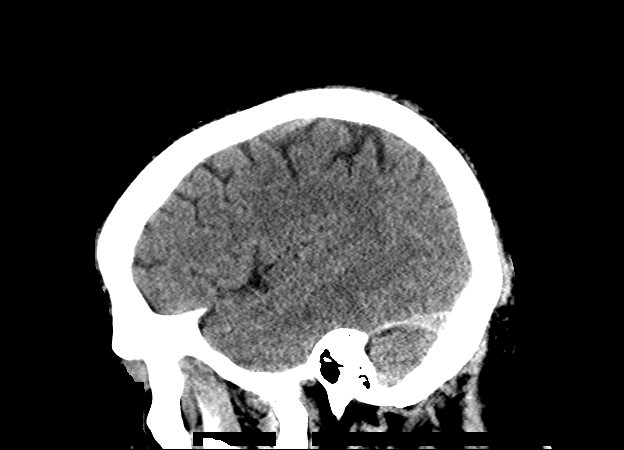
[im 42/83  brain]
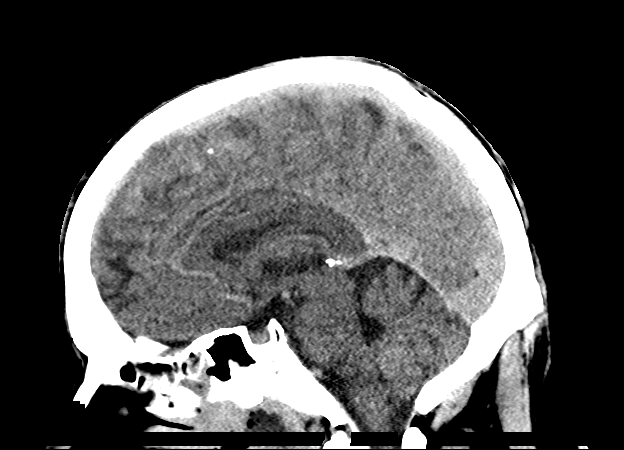
[im 55/83  brain]
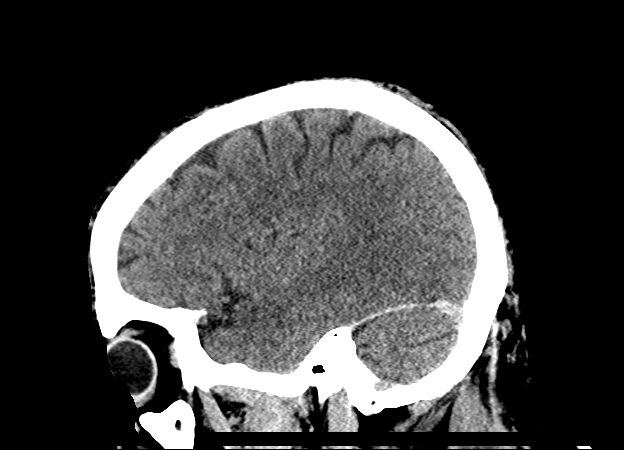

[16 of 47 positions shown; findings below may reference images not displayed]

FINDINGS: Brain: Gray-white differentiation in the left MCA territory is
maintained. The basal ganglia and insular ribbons are preserved.
There is no evidence of acute hemorrhage. There is residual contrast
material opacifying the venous sinuses.

Vascular: The previously seen hyperdense thrombus within the left
middle cerebral artery is no longer present.

Skull: Normal

Sinuses/Orbits: Moderate bilateral maxillary mucosal thickening.
Normal orbits. Mastoids are clear.

Other: None.
IMPRESSION: 1. Status post stroke intervention with resolution of previously
seen hyperdense thrombus in the left middle cerebral artery.
2. No acute hemorrhage or cytotoxic edema.

## 2019-02-05 IMAGING — US IR PERCUTANEOUS ART THORMBECTOMY/INFUSION INTRACRANIAL INCLUDE D
1 series · 1 of 1 positions shown · non-contrast
Comparison: CT 02/16/2017, CT angiogram 02/16/2017

INDICATION: 60-year-old male with acute right-sided weakness secondary to acute
left hemisphere stroke.
TECHNIQUE: Informed written consent was obtained from the patient's family/wife
after a thorough discussion of the procedural risks, benefits and
alternatives. Specific risks discussed include: Bleeding, infection,
contrast reaction, kidney injury/failure, need for further
procedure/surgery, arterial injury or dissection, embolization to
new territory, intracranial hemorrhage (10-15% risk), neurologic
deterioration, cardiopulmonary collapse, death. All questions were
addressed. Maximal Sterile Barrier Technique was utilized including
during the procedure including caps, mask, sterile gowns, sterile
gloves, sterile drape, hand hygiene and skin antiseptic. A timeout
was performed prior to the initiation of the procedure.

[Series 1: ir (id) (id)/(id) · 1 of 1 slices shown]
[im 1/1]
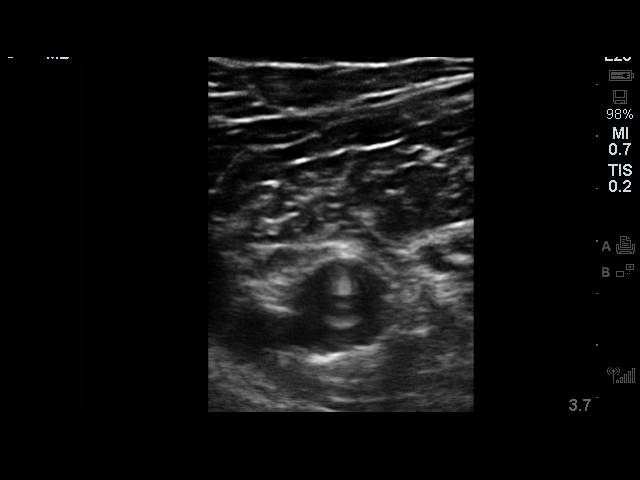

[1 of 1 positions shown; findings below may reference images not displayed]

CT demonstrates left sided emergent large vessel occlusion of the M1
segment.

Last known well [DATE] p.m., and within tPA time frame. IV tPA
administered, and given his baseline function, angiogram for
mechanical thrombectomy was pursued.

CTA demonstrates left ICA occlusion.

EXAM:
ULTRASOUND-GUIDED LEFT COMMON FEMORAL ARTERY ACCESS FOR HEMODYNAMIC
MONITORING

ULTRASOUND-GUIDED RIGHT COMMON FEMORAL ARTERY ACCESS FOR ANGIOGRAM

CERVICO CEREBRAL ANGIOGRAM, WITH COMPLETION CERVICO CEREBRAL
ANGIOGRAM AFTER THROMBECTOMY

ANGIOPLASTY OF LEFT INTERNAL CAROTID ARTERY FOR ACUTE ICA OCCLUSION

MECHANICAL THROMBECTOMY FOR EMERGENT LARGE VESSEL OCCLUSION LEFT MCA
MEDICATIONS:
300 mg Plavix, 325 mg aspirin

ANESTHESIA/SEDATION:
General endotracheal tube anesthesia with the anesthesia team

CONTRAST:  180 cc

FLUOROSCOPY TIME:  Fluoroscopy Time: 59 minutes 24 seconds (8776
mGy).

COMPLICATIONS:
None
Ultrasound survey of the left inguinal region was performed with
images stored and sent to PACs.

A micropuncture needle was used access the left common femoral
artery under ultrasound. With excellent arterial blood flow
returned, an .018 micro wire was passed through the needle, observed
to enter the abdominal aorta under fluoroscopy. The needle was
removed, and a micropuncture sheath was placed over the wire. The
inner dilator and wire were removed, and an 035 Bentson wire was
advanced under fluoroscopy into the abdominal aorta. The sheath was
removed and a standard 4 French vascular sheath was placed for
hemodynamic monitoring. The dilator was removed and the sheath was
flushed.

Ultrasound survey of the right inguinal region was performed with
images stored and sent to PACs.

A micropuncture needle was used access the right common femoral
artery under ultrasound. With excellent arterial blood flow
returned, an .018 micro wire was passed through the needle, observed
to enter the abdominal aorta under fluoroscopy. The needle was
removed, and a micropuncture sheath was placed over the wire. The
inner dilator and wire were removed, and an 035 Bentson wire was
advanced under fluoroscopy into the abdominal aorta. The sheath was
removed and a standard 5 French vascular sheath was placed for
hemodynamic monitoring. The dilator was removed and the sheath was
flushed.

A 5F JB-1 diagnostic catheter was advanced over the wire to the
proximal descending thoracic aorta. Wire was then removed. Double
flush of the catheter was performed. Catheter was then used to
select the right common carotid artery. Angiogram was performed of
the right-sided cervical and cerebral vasculature.

Catheter was then withdrawn into the aortic arch, and the origin of
the left common carotid artery was selected. Road map angiogram was
performed. Standard Glidewire was used to advanced the catheter into
the left common carotid artery.

Formal angiogram was then performed, confirming the left common
carotid artery occlusion and left MCA occlusion. The Glidewire was
then used to advance the JB 1 catheter into the external carotid
artery branches. Glidewire was then exchanged for a Rosen wire.

Exchange length Rosen wire was then passed through the diagnostic
catheter to the distal common carotid artery and the diagnostic
catheter was removed. The 5 French sheath was removed and exchanged
for 8 French 55 centimeter BrightTip sheath. Sheath was flushed and
attached to pressurized and heparinized saline bag for constant
forward flow. Then an 8 French, 85 cm Flowgate balloon tip catheter
was prepared on the back table with inflation of the balloon with
50/50 concentration of dilute contrast. The balloon catheter was
then advanced over the wire, positioned into the distal common
carotid artery. Copious back flush was performed and the balloon
catheter was attached to heparinized and pressurized saline bag for
forward flow.

Rapid transit microcatheter and soft tip Transcend wire were then
used to probe the origin of the common carotid artery. The micro
wiring catheter combination were successful with crossing the acute
occlusion, with the wire catheter combination place distally into
the left ICA. Wire was removed and back flow blood was confirmed to
confirm luminal position. Small contrast injection through the
microcatheter was performed.

Exchange length Transcend wire was then placed through the
microcatheter, it and a rapid transit was removed over the wire.
Balloon angioplasty was then performed at the left ICA occlusion
with a 4mm x 30mm Via-Trak rapid exchange angioplasty balloon. Given
the recalcitrant stenosis, a 5mm x 30mm balloon was selected with
balloon angioplasty to 7 atmosphere.

The balloon was then removed, and NrovueB1 catheter was advanced
over the Transcend wire. Transcend wire was removed and a synchro
soft micro wire was selected.

Micro wire was then advanced into the MCA. The micro wire was then
carefully advanced through the occluded segment. Microcatheter would
not pass beyond the ophthalmic segment, likely secondary to the
incomplete support system with the balloon catheter below the ICA
origin.

The exchange length Transcend wire was then placed through the
microcatheter, and the microcatheter removed. An intermediate CAT 6
was selected, placed over the micro wire into the distal common
carotid artery. The Provue 18 catheter was then advanced through the
intermediate catheter while observing the distal aspect of the wire.

The combination of the microcatheter and the intermediate catheter
were successful in navigating to the skullbase through the
persisting stenosis at the right ICA origin. Single attempt a
passing the balloon catheter into the ICA was unsuccessful. Tip of
the balloon catheter remained in the common carotid artery.

The extra support from the intermediate catheter was useful in
navigating the micro wire and microcatheter through the occlusion of
the MCA into parietal branch. Micro wire was then removed.

Blood was then aspirated through the hub of the microcatheter, and a
gentle contrast injection was performed confirming intraluminal
position.

A rotating hemostatic valve was then attached to the back end of the
microcatheter, and a pressurized and heparinized saline bag was
attached to the catheter.

4 x 40 solitaire device was then selected. Back flush was achieved
at the rotating hemostatic valve, and then the device was gently
advanced through the microcatheter to the distal end. The retriever
was then unsheathed by withdrawing the microcatheter under
fluoroscopy. Once the retriever was completely unsheathed, control
angiogram was performed from the intermediate catheter. 3 minutes of
the stent intercolation.

Intermediate catheter was then advanced through the ophthalmic
segment into the proximal MCA at the proximal aspect of the
solitaire.

Constant aspiration was then performed at the tip of the
intermediate catheter as the retriever was gently and slowly
withdrawn with fluoroscopic observation. Once the retriever was
entirely removed from the system, free aspiration was confirmed at
the hub of the intermediate catheter, with free blood return
confirmed. Significant thrombus was retrieved from the solitaire
device.

Repeat angiogram was then performed through the intermediate
catheter confirming restoration of D959P flow.

The intermediate catheter was then withdrawn into the cervical ICA,
in preparation to address the left ICA stenosis with anticipated
stent placement. Exchange length Transcend wire was placed through
the intermediate catheter which was then removed. Once the exchange
length trans and wire was in place, a control angiogram was
performed, and there was attempted placement of our selected 8-6 x
40mm Exact stent.

The stent would not pass through the lesion at the proximal common
carotid artery.

Stent was removed from the wire and repeat balloon angioplasty with
5 mm balloon was performed. Again, the stent would not pass through
the lesion. Stent was removed from the wire, and a combination of
018 SV8 wire and the rapid transit were used to place the 018 Tamil Tamil
wire adjacent to the 0.014 Transcend wire. This was successful in
straightening the carotid tortuosity, however, when there was
attempt made to pass the stent system with 2 wire in place, the
lumen on the flow gate catheter would not accept the Tamil Tamil wire. The
stent system was then removed, and a final balloon angioplasty was
performed with 5 mm diameter balloon and the 018 SV 8 wire has a
modified cutting balloon technique. Eight atmospheric was achieved
(nominal diameter).

Final attempt at passing the stent across the lesion was
unsuccessful, and attempted stenting was abandoned after discussion
with Dr. [place] of [REDACTED].

Final control angiogram of the left carotid system was performed.

Control angiogram was performed at the right common femoral artery
puncture site, after the 55 centimeter 8 French sheath was exchanged
for a standard 9 French sheath at the common femoral artery puncture
site.

Patient tolerated the procedure well and remained hemodynamically
stable throughout.

No complications were encountered.

Estimated blood loss approximately 150 cc.
FINDINGS: Ultrasound survey of the left common femoral artery demonstrates
wide patency.

Ultrasound survey of the right common femoral artery demonstrates
wide patency.

Initial images:

Right common carotid artery:  Normal course caliber and contour.

Right external carotid artery: Patent with antegrade flow.

Right internal carotid artery: Significant stenosis at the proximal
right internal carotid artery with a string sign. The percent
stenosis estimated greater than 73% fine ask at criteria, however,
this is favored to be greater given there is decreased caliber of
the distal ICA secondary to decreased flow.

Right MCA: M1 segment patent. The arterial, capillary/ parenchymal,
and venous phase are delayed given the significant stenosis
proximally.

Contralateral filling of the left anterior cerebral artery with
patent anterior communicating artery and large right A1 segment.

Right ACA: Large right A1 segment with perfusion of left and right
ACA territory.

Left common carotid artery: Left common carotid artery origin is a
common or drain from the base of the innominate artery. Mild plaque
at the origin. Unremarkable course caliber and contour of the common
carotid artery.

Left external carotid artery: Patent with antegrade flow.

Left internal carotid artery: Advanced calcified and soft plaque at
the carotid bifurcation and the left internal carotid artery with
occlusion at the initiation of the case. Distal segment is patent,
which was proven after crossing of the proximal occlusion.

Left MCA: M1 occlusion secondary to thrombus was confirmed on the
initial angiogram.

Left ACA: Occlusion extended into the left A1 segment, compatible
with a T occlusion

Final images:

After successful thrombectomy, there is restoration of D959P flow of
the left hemisphere. Restoration of flow through the left A1
segment.

Residual high-grade stenosis at the left ICA origin which was
recalcitrant to multiple balloon angioplasty up to 5 mm with a
cutting balloon technique.

Dense plaque at the left ICA origin as well as the geometry of the
ICA origin precluded placement of the selected stent.

Final image demonstrates flow through the left ICA and left MCA
territory

PROCEDURE:
Cervical cerebral angiogram with left MCA thrombectomy with a
combined aspiration and solitaire technique.
IMPRESSION: Status post treatment of tandem occlusion of left ICA and MCA with
balloon angioplasty of advanced proximal ICA disease and mechanical
thrombectomy of acute left M1 occlusion.

Restoration of TICI 3 flow was achieved with 1 pass of solitaire
device and local aspiration, and flow through the left ICA occlusion
was re-established by balloon angioplasty.

Dense calcified plaque and the geometry of the proximal left ICA
precluded placement of the selected Exact stent, and the patient
will remain on medical therapy.

Angiogram of the right carotid system demonstrates high-grade
stenosis of proximal right ICA secondary to advanced atherosclerotic
changes. Although the angiographic measurement of the string sign is
73% stenosis by NASCET criteria, the degree of stenosis is favored
to be greater, as the distal vessel is compromised in diameter given
the low flow.

PLAN:
Noncontrast head CT postprocedure.

ICU admission.

Continue anti-platelet medication.

Right common femoral 9 French sheath maintained which will be
removed after 12 hours.

Left common femoral 4 French sheath maintained for hemodynamic
monitoring.

## 2019-06-16 ENCOUNTER — Other Ambulatory Visit: Payer: Self-pay | Admitting: Surgery

## 2019-06-16 DIAGNOSIS — M7581 Other shoulder lesions, right shoulder: Secondary | ICD-10-CM

## 2019-06-16 DIAGNOSIS — M19111 Post-traumatic osteoarthritis, right shoulder: Secondary | ICD-10-CM

## 2019-06-27 ENCOUNTER — Other Ambulatory Visit: Payer: Self-pay

## 2019-06-27 ENCOUNTER — Ambulatory Visit
Admission: RE | Admit: 2019-06-27 | Discharge: 2019-06-27 | Disposition: A | Discharge status: Home / Self Care | Payer: Managed Care, Other (non HMO) | Source: Ambulatory Visit | Attending: Surgery | Admitting: Surgery

## 2019-06-27 DIAGNOSIS — M19111 Post-traumatic osteoarthritis, right shoulder: Secondary | ICD-10-CM | POA: Insufficient documentation

## 2019-06-27 DIAGNOSIS — M7581 Other shoulder lesions, right shoulder: Secondary | ICD-10-CM | POA: Insufficient documentation

## 2019-10-07 ENCOUNTER — Other Ambulatory Visit: Payer: Self-pay | Admitting: Surgery

## 2019-10-17 ENCOUNTER — Other Ambulatory Visit
Admission: RE | Admit: 2019-10-17 | Discharge: 2019-10-17 | Disposition: A | Discharge status: Home / Self Care | Payer: Managed Care, Other (non HMO) | Source: Ambulatory Visit | Attending: Surgery | Admitting: Surgery

## 2019-10-17 ENCOUNTER — Inpatient Hospital Stay: Admission: RE | Admit: 2019-10-17 | Payer: Managed Care, Other (non HMO) | Source: Ambulatory Visit

## 2019-10-17 ENCOUNTER — Other Ambulatory Visit: Payer: Self-pay

## 2019-10-17 ENCOUNTER — Encounter
Admission: RE | Admit: 2019-10-17 | Discharge: 2019-10-17 | Disposition: A | Discharge status: Home / Self Care | Payer: Managed Care, Other (non HMO) | Source: Ambulatory Visit | Attending: Surgery | Admitting: Surgery

## 2019-10-17 DIAGNOSIS — S46011A Strain of muscle(s) and tendon(s) of the rotator cuff of right shoulder, initial encounter: Secondary | ICD-10-CM | POA: Insufficient documentation

## 2019-10-17 DIAGNOSIS — R9431 Abnormal electrocardiogram [ECG] [EKG]: Secondary | ICD-10-CM | POA: Insufficient documentation

## 2019-10-17 DIAGNOSIS — M778 Other enthesopathies, not elsewhere classified: Secondary | ICD-10-CM | POA: Diagnosis not present

## 2019-10-17 DIAGNOSIS — Z01818 Encounter for other preprocedural examination: Secondary | ICD-10-CM | POA: Diagnosis present

## 2019-10-17 DIAGNOSIS — M19011 Primary osteoarthritis, right shoulder: Secondary | ICD-10-CM | POA: Diagnosis not present

## 2019-10-17 DIAGNOSIS — Z20828 Contact with and (suspected) exposure to other viral communicable diseases: Secondary | ICD-10-CM | POA: Insufficient documentation

## 2019-10-17 LAB — URINALYSIS, ROUTINE W REFLEX MICROSCOPIC
Bacteria, UA: NONE SEEN
Glucose, UA: NEGATIVE mg/dL
Hgb urine dipstick: NEGATIVE
Ketones, ur: NEGATIVE mg/dL
Leukocytes,Ua: NEGATIVE
Nitrite: NEGATIVE
Protein, ur: 30 mg/dL — AB
Specific Gravity, Urine: 1.023 (ref 1.005–1.030)
pH: 5 (ref 5.0–8.0)

## 2019-10-17 LAB — COMPREHENSIVE METABOLIC PANEL
ALT: 24 U/L (ref 0–44)
AST: 27 U/L (ref 15–41)
Albumin: 4.4 g/dL (ref 3.5–5.0)
Alkaline Phosphatase: 31 U/L — ABNORMAL LOW (ref 38–126)
Anion gap: 11 (ref 5–15)
BUN: 27 mg/dL — ABNORMAL HIGH (ref 8–23)
CO2: 26 mmol/L (ref 22–32)
Calcium: 9.4 mg/dL (ref 8.9–10.3)
Chloride: 102 mmol/L (ref 98–111)
Creatinine, Ser: 1.16 mg/dL (ref 0.61–1.24)
GFR calc Af Amer: >60 mL/min (ref >60)
GFR calc non Af Amer: >60 mL/min (ref >60)
Glucose, Bld: 139 mg/dL — ABNORMAL HIGH (ref 70–99)
Potassium: 3.3 mmol/L — ABNORMAL LOW (ref 3.5–5.1)
Sodium: 139 mmol/L (ref 135–145)
Total Bilirubin: 0.8 mg/dL (ref 0.3–1.2)
Total Protein: 7.9 g/dL (ref 6.5–8.1)

## 2019-10-17 LAB — CBC WITH DIFFERENTIAL/PLATELET
Abs Immature Granulocytes: 0.01 10*3/uL (ref 0.00–0.07)
Basophils Absolute: 0 10*3/uL (ref 0.0–0.1)
Basophils Relative: 0 %
Eosinophils Absolute: 0.1 10*3/uL (ref 0.0–0.5)
Eosinophils Relative: 2 %
HCT: 38.8 % — ABNORMAL LOW (ref 39.0–52.0)
Hemoglobin: 13.7 g/dL (ref 13.0–17.0)
Immature Granulocytes: 0 %
Lymphocytes Relative: 27 %
Lymphs Abs: 1.6 10*3/uL (ref 0.7–4.0)
MCH: 31.7 pg (ref 26.0–34.0)
MCHC: 35.3 g/dL (ref 30.0–36.0)
MCV: 89.8 fL (ref 80.0–100.0)
Monocytes Absolute: 0.6 10*3/uL (ref 0.1–1.0)
Monocytes Relative: 10 %
Neutro Abs: 3.6 10*3/uL (ref 1.7–7.7)
Neutrophils Relative %: 61 %
Platelets: 218 10*3/uL (ref 150–400)
RBC: 4.32 MIL/uL (ref 4.22–5.81)
RDW: 12.5 % (ref 11.5–15.5)
WBC: 5.9 10*3/uL (ref 4.0–10.5)
nRBC: 0 % (ref 0.0–0.2)

## 2019-10-17 LAB — TYPE AND SCREEN
ABO/RH(D): A POS
Antibody Screen: NEGATIVE

## 2019-10-17 LAB — SURGICAL PCR SCREEN
MRSA, PCR: NEGATIVE
Staphylococcus aureus: POSITIVE — AB

## 2019-10-17 LAB — SARS CORONAVIRUS 2 (TAT 6-24 HRS): SARS Coronavirus 2: NEGATIVE

## 2019-10-17 NOTE — Pre-Procedure Instructions (Signed)
Incentive spirometry and carbohydrate drink given along with instructions. 

## 2019-10-17 NOTE — Patient Instructions (Addendum)
Your procedure is scheduled on: 10/21/2019 Tues Report to Same Day Surgery 2nd floor medical mall [facility] Entrance-take elevator on left to 2nd floor.  Check in with surgery information desk.) To find out your arrival time please call [phone] between 1PM - 3PM on 10/20/2019 Mon Remember: Instructions that are not followed completely may result in serious medical risk, up to and including death, or upon the discretion of your surgeon and anesthesiologist your surgery may need to be rescheduled.    _x___ 1. Do not eat food after midnight the night before your procedure. You may drink clear liquids up to 2 hours before you are scheduled to arrive at the hospital for your procedure.  Do not drink clear liquids within 2 hours of your scheduled arrival to the hospital.  Clear liquids include  --Water or Apple juice without pulp  --Clear carbohydrate beverage such as ClearFast or Gatorade  --Black Coffee or Clear Tea (No milk, no creamers, do not add anything to                  the coffee or Tea Type 1 and type 2 diabetics should only drink water.   ____Ensure clear carbohydrate drink on the way to the hospital for bariatric patients  __x__Ensure clear carbohydrate drink 3 hours before surgery. Complete carbohydrate drink 1.5 hrs before coming to hospital   No gum chewing or hard candies.     __x__ 2. No Alcohol for 24 hours before or after surgery.   __x__3. No Smoking or e-cigarettes for 24 prior to surgery.  Do not use any chewable tobacco products for at least 6 hour prior to surgery   ____  4. Bring all medications with you on the day of surgery if instructed.    __x__ 5. Notify your doctor if there is any change in your medical condition     (cold, fever, infections).    x___6. On the morning of surgery brush your teeth with toothpaste and water.  You may rinse your mouth with mouth wash if you wish.  Do not swallow any toothpaste or mouthwash.   Do not wear jewelry,  make-up, hairpins, clips or nail polish.  Do not wear lotions, powders, or perfumes. You may wear deodorant.  Do not shave 48 hours prior to surgery. Men may shave face and neck.  Do not bring valuables to the hospital.    [facility] is not responsible for any belongings or valuables.               Contacts, dentures or bridgework may not be worn into surgery.  Leave your suitcase in the car. After surgery it may be brought to your room.  For patients admitted to the hospital, discharge time is determined by your                       treatment team.  _  Patients discharged the day of surgery will not be allowed to drive home.  You will need someone to drive you home and stay with you the night of your procedure.    Please read over the following fact sheets that you were given:   [facility] Preparing for Surgery and or MRSA Information   _x___ Take anti-hypertensive listed below, cardiac, seizure, asthma,     anti-reflux and psychiatric medicines. These include:  1. atorvastatin (LIPITOR) 80 MG tablet  2.FLUoxetine (PROZAC) 20 MG capsule  3.memantine (NAMENDA) 5 MG tablet  4.pantoprazole (PROTONIX) 40 MG tablet  5.  6.  ____Fleets enema or Magnesium Citrate as directed.   _x___ Use CHG Soap or sage wipes as directed on instruction sheet   ____ Use inhalers on the day of surgery and bring to hospital day of surgery  __x__ Stop Metformin and Janumet 2 days prior to surgery.    ____ Take 1/2 of usual insulin dose the night before surgery and none on the morning     surgery.   _x___ Follow recommendations from Cardiologist, Pulmonologist or PCP regarding          stopping Aspirin, Coumadin, Plavix ,Eliquis, Effient, or Pradaxa, and Pletal.  X____Stop Anti-inflammatories such as Advil, Aleve, Ibuprofen, Motrin, Naproxen, Naprosyn, Goodies powders or aspirin products. OK to take Tylenol and                          Celebrex.   _x___ Stop supplements until after surgery.  But may  continue Vitamin D, Vitamin B,       and multivitamin.   ____ Bring C-Pap to the hospital.

## 2019-10-21 ENCOUNTER — Ambulatory Visit
Admission: RE | Admit: 2019-10-21 | Discharge: 2019-10-21 | Disposition: A | Discharge status: Home / Self Care | Payer: Managed Care, Other (non HMO) | Attending: Surgery | Admitting: Surgery

## 2019-10-21 ENCOUNTER — Ambulatory Visit: Payer: Managed Care, Other (non HMO)

## 2019-10-21 ENCOUNTER — Ambulatory Visit: Payer: Managed Care, Other (non HMO) | Admitting: Certified Registered Nurse Anesthetist

## 2019-10-21 ENCOUNTER — Encounter: Payer: Self-pay | Admitting: Surgery

## 2019-10-21 ENCOUNTER — Encounter
Admission: RE | Disposition: A | Discharge status: Home / Self Care | Payer: Self-pay | Source: Home / Self Care | Attending: Surgery

## 2019-10-21 DIAGNOSIS — Z7982 Long term (current) use of aspirin: Secondary | ICD-10-CM | POA: Diagnosis not present

## 2019-10-21 DIAGNOSIS — E785 Hyperlipidemia, unspecified: Secondary | ICD-10-CM | POA: Diagnosis not present

## 2019-10-21 DIAGNOSIS — I69928 Other speech and language deficits following unspecified cerebrovascular disease: Secondary | ICD-10-CM | POA: Diagnosis not present

## 2019-10-21 DIAGNOSIS — Z9189 Other specified personal risk factors, not elsewhere classified: Secondary | ICD-10-CM

## 2019-10-21 DIAGNOSIS — Z7984 Long term (current) use of oral hypoglycemic drugs: Secondary | ICD-10-CM | POA: Diagnosis not present

## 2019-10-21 DIAGNOSIS — Z96611 Presence of right artificial shoulder joint: Secondary | ICD-10-CM

## 2019-10-21 DIAGNOSIS — F419 Anxiety disorder, unspecified: Secondary | ICD-10-CM | POA: Insufficient documentation

## 2019-10-21 DIAGNOSIS — K219 Gastro-esophageal reflux disease without esophagitis: Secondary | ICD-10-CM | POA: Diagnosis not present

## 2019-10-21 DIAGNOSIS — E1149 Type 2 diabetes mellitus with other diabetic neurological complication: Secondary | ICD-10-CM | POA: Diagnosis not present

## 2019-10-21 DIAGNOSIS — I69351 Hemiplegia and hemiparesis following cerebral infarction affecting right dominant side: Secondary | ICD-10-CM | POA: Insufficient documentation

## 2019-10-21 DIAGNOSIS — Z79899 Other long term (current) drug therapy: Secondary | ICD-10-CM | POA: Diagnosis not present

## 2019-10-21 DIAGNOSIS — F329 Major depressive disorder, single episode, unspecified: Secondary | ICD-10-CM | POA: Insufficient documentation

## 2019-10-21 DIAGNOSIS — M19011 Primary osteoarthritis, right shoulder: Secondary | ICD-10-CM | POA: Insufficient documentation

## 2019-10-21 DIAGNOSIS — I10 Essential (primary) hypertension: Secondary | ICD-10-CM | POA: Diagnosis not present

## 2019-10-21 DIAGNOSIS — M75101 Unspecified rotator cuff tear or rupture of right shoulder, not specified as traumatic: Secondary | ICD-10-CM | POA: Diagnosis not present

## 2019-10-21 HISTORY — PX: REVERSE SHOULDER ARTHROPLASTY: SHX5054

## 2019-10-21 LAB — GLUCOSE, CAPILLARY
Glucose-Capillary: 139 mg/dL — ABNORMAL HIGH (ref 70–99)
Glucose-Capillary: 190 mg/dL — ABNORMAL HIGH (ref 70–99)

## 2019-10-21 SURGERY — ARTHROPLASTY, SHOULDER, TOTAL, REVERSE
Anesthesia: General | Site: Shoulder | Laterality: Right

## 2019-10-21 MED ORDER — SUCCINYLCHOLINE CHLORIDE 20 MG/ML IJ SOLN
INTRAMUSCULAR | Status: AC
  Filled 2019-10-21: qty 1

## 2019-10-21 MED ORDER — FENTANYL CITRATE (PF) 100 MCG/2ML IJ SOLN
50.0000 ug | Freq: Once | INTRAMUSCULAR | Status: AC
  Administered 2019-10-21: 50 ug via INTRAVENOUS

## 2019-10-21 MED ORDER — KETOROLAC TROMETHAMINE 30 MG/ML IJ SOLN
30.0000 mg | Freq: Once | INTRAMUSCULAR | Status: AC
  Administered 2019-10-21: 30 mg via INTRAVENOUS

## 2019-10-21 MED ORDER — LIDOCAINE HCL (CARDIAC) PF 100 MG/5ML IV SOSY
PREFILLED_SYRINGE | INTRAVENOUS | Status: DC | PRN
  Administered 2019-10-21: 100 mg via INTRAVENOUS

## 2019-10-21 MED ORDER — TRANEXAMIC ACID 1000 MG/10ML IV SOLN
INTRAVENOUS | Status: DC | PRN
  Administered 2019-10-21: 1000 mg via INTRAVENOUS

## 2019-10-21 MED ORDER — BUPIVACAINE LIPOSOME 1.3 % IJ SUSP
INTRAMUSCULAR | Status: AC
  Filled 2019-10-21: qty 20

## 2019-10-21 MED ORDER — FENTANYL CITRATE (PF) 100 MCG/2ML IJ SOLN
INTRAMUSCULAR | Status: AC
  Filled 2019-10-21: qty 2

## 2019-10-21 MED ORDER — PROPOFOL 10 MG/ML IV BOLUS
INTRAVENOUS | Status: DC | PRN
  Administered 2019-10-21: 130 mg via INTRAVENOUS

## 2019-10-21 MED ORDER — BUPIVACAINE-EPINEPHRINE (PF) 0.5% -1:200000 IJ SOLN
INTRAMUSCULAR | Status: DC | PRN
  Administered 2019-10-21: 30 mL via PERINEURAL

## 2019-10-21 MED ORDER — SODIUM CHLORIDE 0.9 % IV SOLN
INTRAVENOUS | Status: DC | PRN
  Administered 2019-10-21: 08:00:00 20 ug/min via INTRAVENOUS

## 2019-10-21 MED ORDER — METOCLOPRAMIDE HCL 10 MG PO TABS: 5.0000 mg | ORAL_TABLET | Freq: Three times a day (TID) | ORAL | Status: DC | PRN

## 2019-10-21 MED ORDER — ONDANSETRON HCL 4 MG/2ML IJ SOLN: 4.0000 mg | Freq: Four times a day (QID) | INTRAMUSCULAR | Status: DC | PRN

## 2019-10-21 MED ORDER — EPINEPHRINE PF 1 MG/ML IJ SOLN
INTRAMUSCULAR | Status: AC
  Filled 2019-10-21: qty 1

## 2019-10-21 MED ORDER — DEXAMETHASONE SODIUM PHOSPHATE 10 MG/ML IJ SOLN
INTRAMUSCULAR | Status: AC
  Filled 2019-10-21: qty 1

## 2019-10-21 MED ORDER — PROPOFOL 10 MG/ML IV BOLUS
INTRAVENOUS | Status: AC
  Filled 2019-10-21: qty 20

## 2019-10-21 MED ORDER — TRANEXAMIC ACID 1000 MG/10ML IV SOLN
INTRAVENOUS | Status: AC
  Filled 2019-10-21: qty 10

## 2019-10-21 MED ORDER — CEFAZOLIN SODIUM-DEXTROSE 2-4 GM/100ML-% IV SOLN
2.0000 g | INTRAVENOUS | Status: AC
  Administered 2019-10-21: 2 g via INTRAVENOUS

## 2019-10-21 MED ORDER — ACETAMINOPHEN 500 MG PO TABS: 1000.0000 mg | ORAL_TABLET | Freq: Four times a day (QID) | ORAL | Status: DC

## 2019-10-21 MED ORDER — BUPIVACAINE HCL (PF) 0.5 % IJ SOLN
INTRAMUSCULAR | Status: AC
  Filled 2019-10-21: qty 10

## 2019-10-21 MED ORDER — LIDOCAINE HCL (PF) 1 % IJ SOLN
INTRAMUSCULAR | Status: DC | PRN
  Administered 2019-10-21: 3 mL via INTRADERMAL

## 2019-10-21 MED ORDER — METOCLOPRAMIDE HCL 5 MG/ML IJ SOLN: 5.0000 mg | Freq: Three times a day (TID) | INTRAMUSCULAR | Status: DC | PRN

## 2019-10-21 MED ORDER — ROCURONIUM BROMIDE 100 MG/10ML IV SOLN
INTRAVENOUS | Status: DC | PRN
  Administered 2019-10-21: 10 mg via INTRAVENOUS
  Administered 2019-10-21: 50 mg via INTRAVENOUS
  Administered 2019-10-21: 10 mg via INTRAVENOUS

## 2019-10-21 MED ORDER — PHENYLEPHRINE HCL (PRESSORS) 10 MG/ML IV SOLN: INTRAVENOUS | Status: DC | PRN

## 2019-10-21 MED ORDER — ROCURONIUM BROMIDE 50 MG/5ML IV SOLN
INTRAVENOUS | Status: AC
  Filled 2019-10-21: qty 1

## 2019-10-21 MED ORDER — ONDANSETRON HCL 4 MG/2ML IJ SOLN
INTRAMUSCULAR | Status: AC
  Filled 2019-10-21: qty 2

## 2019-10-21 MED ORDER — LIDOCAINE HCL (PF) 1 % IJ SOLN
INTRAMUSCULAR | Status: AC
  Filled 2019-10-21: qty 5

## 2019-10-21 MED ORDER — BUPIVACAINE HCL (PF) 0.5 % IJ SOLN
INTRAMUSCULAR | Status: DC | PRN
  Administered 2019-10-21: 10 mL via PERINEURAL

## 2019-10-21 MED ORDER — CEFAZOLIN SODIUM-DEXTROSE 2-4 GM/100ML-% IV SOLN
INTRAVENOUS | Status: AC
  Administered 2019-10-21: 2 g via INTRAVENOUS
  Filled 2019-10-21: qty 100

## 2019-10-21 MED ORDER — POTASSIUM CHLORIDE IN NACL 20-0.9 MEQ/L-% IV SOLN
INTRAVENOUS | Status: DC
  Filled 2019-10-21 (×3): qty 1000

## 2019-10-21 MED ORDER — ONDANSETRON HCL 4 MG/2ML IJ SOLN: 4.0000 mg | Freq: Once | INTRAMUSCULAR | Status: DC | PRN

## 2019-10-21 MED ORDER — KETOROLAC TROMETHAMINE 30 MG/ML IJ SOLN: 15.0000 mg | Freq: Four times a day (QID) | INTRAMUSCULAR | Status: DC

## 2019-10-21 MED ORDER — FENTANYL CITRATE (PF) 100 MCG/2ML IJ SOLN
INTRAMUSCULAR | Status: DC | PRN
  Administered 2019-10-21: 50 ug via INTRAVENOUS
  Administered 2019-10-21 (×3): 25 ug via INTRAVENOUS
  Administered 2019-10-21: 50 ug via INTRAVENOUS

## 2019-10-21 MED ORDER — OXYCODONE HCL 5 MG PO TABS: 5.0000 mg | ORAL_TABLET | ORAL | 0 refills | Status: AC | PRN

## 2019-10-21 MED ORDER — CEFAZOLIN SODIUM-DEXTROSE 2-4 GM/100ML-% IV SOLN
INTRAVENOUS | Status: AC
  Filled 2019-10-21: qty 100

## 2019-10-21 MED ORDER — DEXAMETHASONE SODIUM PHOSPHATE 10 MG/ML IJ SOLN
INTRAMUSCULAR | Status: DC | PRN
  Administered 2019-10-21: 10 mg via INTRAVENOUS

## 2019-10-21 MED ORDER — OXYCODONE HCL 5 MG PO TABS
5.0000 mg | ORAL_TABLET | ORAL | Status: DC | PRN
  Administered 2019-10-21: 5 mg via ORAL

## 2019-10-21 MED ORDER — LIDOCAINE HCL (PF) 2 % IJ SOLN
INTRAMUSCULAR | Status: AC
  Filled 2019-10-21: qty 10

## 2019-10-21 MED ORDER — SUGAMMADEX SODIUM 200 MG/2ML IV SOLN
INTRAVENOUS | Status: DC | PRN
  Administered 2019-10-21: 200 mg via INTRAVENOUS

## 2019-10-21 MED ORDER — PHENYLEPHRINE HCL (PRESSORS) 10 MG/ML IV SOLN
INTRAVENOUS | Status: DC | PRN
  Administered 2019-10-21: 100 ug via INTRAVENOUS

## 2019-10-21 MED ORDER — ONDANSETRON HCL 4 MG PO TABS: 4.0000 mg | ORAL_TABLET | Freq: Four times a day (QID) | ORAL | Status: DC | PRN

## 2019-10-21 MED ORDER — ACETAMINOPHEN 500 MG PO TABS
ORAL_TABLET | ORAL | Status: AC
  Administered 2019-10-21: 1000 mg via ORAL
  Filled 2019-10-21: qty 2

## 2019-10-21 MED ORDER — OXYCODONE HCL 5 MG PO TABS
ORAL_TABLET | ORAL | Status: AC
  Filled 2019-10-21: qty 1

## 2019-10-21 MED ORDER — CEFAZOLIN SODIUM-DEXTROSE 2-4 GM/100ML-% IV SOLN: 2.0000 g | Freq: Four times a day (QID) | INTRAVENOUS | Status: DC

## 2019-10-21 MED ORDER — SODIUM CHLORIDE 0.9 % IV SOLN
INTRAVENOUS | Status: DC
  Administered 2019-10-21: 250 mL via INTRAVENOUS

## 2019-10-21 MED ORDER — BUPIVACAINE LIPOSOME 1.3 % IJ SUSP
INTRAMUSCULAR | Status: DC | PRN
  Administered 2019-10-21: 20 mL via PERINEURAL

## 2019-10-21 MED ORDER — MIDAZOLAM HCL 2 MG/2ML IJ SOLN
INTRAMUSCULAR | Status: AC
  Filled 2019-10-21: qty 2

## 2019-10-21 MED ORDER — PHENYLEPHRINE HCL (PRESSORS) 10 MG/ML IV SOLN
INTRAVENOUS | Status: AC
  Filled 2019-10-21: qty 1

## 2019-10-21 MED ORDER — SODIUM CHLORIDE (PF) 0.9 % IJ SOLN
INTRAMUSCULAR | Status: AC
  Filled 2019-10-21: qty 50

## 2019-10-21 MED ORDER — CHLORHEXIDINE GLUCONATE 4 % EX LIQD
60.0000 mL | Freq: Once | CUTANEOUS | Status: AC
  Administered 2019-10-21: 4 via TOPICAL

## 2019-10-21 MED ORDER — BUPIVACAINE HCL (PF) 0.5 % IJ SOLN
INTRAMUSCULAR | Status: AC
  Filled 2019-10-21: qty 30

## 2019-10-21 MED ORDER — OXYCODONE HCL 5 MG PO TABS
ORAL_TABLET | ORAL | Status: AC
  Administered 2019-10-21: 5 mg via ORAL
  Filled 2019-10-21: qty 1

## 2019-10-21 MED ORDER — SODIUM CHLORIDE 0.9 % IV BOLUS
500.0000 mL | Freq: Once | INTRAVENOUS | Status: AC
  Administered 2019-10-21: 250 mL via INTRAVENOUS

## 2019-10-21 MED ORDER — ONDANSETRON HCL 4 MG/2ML IJ SOLN
INTRAMUSCULAR | Status: DC | PRN
  Administered 2019-10-21: 4 mg via INTRAVENOUS

## 2019-10-21 MED ORDER — KETOROLAC TROMETHAMINE 30 MG/ML IJ SOLN
INTRAMUSCULAR | Status: AC
  Filled 2019-10-21: qty 1

## 2019-10-21 MED ORDER — EPHEDRINE SULFATE 50 MG/ML IJ SOLN
INTRAMUSCULAR | Status: DC | PRN
  Administered 2019-10-21: 5 mg via INTRAVENOUS
  Administered 2019-10-21: 10 mg via INTRAVENOUS
  Administered 2019-10-21: 2.5 mg via INTRAVENOUS
  Administered 2019-10-21: 5 mg via INTRAVENOUS
  Administered 2019-10-21: 10 mg via INTRAVENOUS

## 2019-10-21 MED ORDER — FENTANYL CITRATE (PF) 100 MCG/2ML IJ SOLN: 25.0000 ug | INTRAMUSCULAR | Status: DC | PRN

## 2019-10-21 MED ORDER — SUGAMMADEX SODIUM 200 MG/2ML IV SOLN
INTRAVENOUS | Status: AC
  Filled 2019-10-21: qty 2

## 2019-10-21 SURGICAL SUPPLY — 69 items
BASEPLATE GLENOSPHERE 25 (Plate) ×2 IMPLANT
BASEPLATE GLENOSPHERE 25MM (Plate) ×1 IMPLANT
BEARING HUMERAL 40 STD VITE (Joint) ×3 IMPLANT
BIT DRILL TWIST 2.7 (BIT) ×2 IMPLANT
BIT DRILL TWIST 2.7MM (BIT) ×1
BLADE SAW SAG 25X90X1.19 (BLADE) ×3 IMPLANT
CANISTER SUCT 1200ML W/VALVE (MISCELLANEOUS) ×3 IMPLANT
CANISTER SUCT 3000ML PPV (MISCELLANEOUS) ×6 IMPLANT
CHLORAPREP W/TINT 26 (MISCELLANEOUS) ×3 IMPLANT
COOLER ICEMAN CLASSIC (MISCELLANEOUS) ×3 IMPLANT
COVER BACK TABLE REUSABLE LG (DRAPES) ×3 IMPLANT
COVER WAND RF STERILE (DRAPES) ×3 IMPLANT
CRADLE LAMINECT ARM (MISCELLANEOUS) ×3 IMPLANT
DIAL VERSA SHOULDER 40 STD (Joint) ×3 IMPLANT
DRAPE 3/4 80X56 (DRAPES) ×6 IMPLANT
DRAPE IMP U-DRAPE 54X76 (DRAPES) ×6 IMPLANT
DRAPE INCISE IOBAN 66X45 STRL (DRAPES) ×6 IMPLANT
DRAPE SPLIT 6X30 W/TAPE (DRAPES) ×6 IMPLANT
DRSG OPSITE POSTOP 4X8 (GAUZE/BANDAGES/DRESSINGS) ×3 IMPLANT
ELECT BLADE 6.5 EXT (BLADE) IMPLANT
ELECT CAUTERY BLADE 6.4 (BLADE) ×3 IMPLANT
GLOVE BIO SURGEON STRL SZ7.5 (GLOVE) ×12 IMPLANT
GLOVE BIO SURGEON STRL SZ8 (GLOVE) ×15 IMPLANT
GLOVE BIOGEL PI IND STRL 8 (GLOVE) ×1 IMPLANT
GLOVE BIOGEL PI INDICATOR 8 (GLOVE) ×2
GLOVE INDICATOR 8.0 STRL GRN (GLOVE) ×3 IMPLANT
GLOVE SURG SYN 6.5 ES PF (GLOVE) ×3 IMPLANT
GOWN STRL REUS W/ TWL LRG LVL3 (GOWN DISPOSABLE) ×1 IMPLANT
GOWN STRL REUS W/ TWL XL LVL3 (GOWN DISPOSABLE) ×1 IMPLANT
GOWN STRL REUS W/TWL LRG LVL3 (GOWN DISPOSABLE) ×2
GOWN STRL REUS W/TWL XL LVL3 (GOWN DISPOSABLE) ×2
HOOD PEEL AWAY FLYTE STAYCOOL (MISCELLANEOUS) ×9 IMPLANT
ILLUMINATOR WAVEGUIDE N/F (MISCELLANEOUS) ×3 IMPLANT
KIT STABILIZATION SHOULDER (MISCELLANEOUS) ×3 IMPLANT
KIT TURNOVER KIT A (KITS) ×3 IMPLANT
MASK FACE SPIDER DISP (MASK) ×3 IMPLANT
MAT ABSORB  FLUID 56X50 GRAY (MISCELLANEOUS) ×2
MAT ABSORB FLUID 56X50 GRAY (MISCELLANEOUS) ×1 IMPLANT
NDL SAFETY ECLIPSE 18X1.5 (NEEDLE) ×1 IMPLANT
NEEDLE HYPO 18GX1.5 SHARP (NEEDLE) ×2
NEEDLE HYPO 22GX1.5 SAFETY (NEEDLE) ×3 IMPLANT
NEEDLE SPNL 20GX3.5 QUINCKE YW (NEEDLE) ×3 IMPLANT
NS IRRIG 500ML POUR BTL (IV SOLUTION) ×3 IMPLANT
PACK ARTHROSCOPY SHOULDER (MISCELLANEOUS) ×3 IMPLANT
PAD WRAPON POLAR SHDR UNIV (MISCELLANEOUS) ×1 IMPLANT
PENCIL SMOKE EVACUATOR (MISCELLANEOUS) ×3 IMPLANT
PIN THREADED REVERSE (PIN) ×3 IMPLANT
PULSAVAC PLUS IRRIG FAN TIP (DISPOSABLE) ×3
SCREW BONE LOCKING 4.75X30X3.5 (Screw) ×3 IMPLANT
SCREW BONE LOCKING 4.75X35X3.5 (Screw) ×3 IMPLANT
SCREW CENTRAL 6.5X20MM (Screw) ×3 IMPLANT
SCREW LOCKING NS 4.75MMX20MM (Screw) ×6 IMPLANT
SLING ULTRA II M (MISCELLANEOUS) ×3 IMPLANT
SOL .9 NS 3000ML IRR  AL (IV SOLUTION) ×2
SOL .9 NS 3000ML IRR UROMATIC (IV SOLUTION) ×1 IMPLANT
SPONGE LAP 18X18 RF (DISPOSABLE) ×3 IMPLANT
STAPLER SKIN PROX 35W (STAPLE) ×3 IMPLANT
STEM HUMERAL STRL 15MMX140MM (Stem) ×3 IMPLANT
SUT ETHIBOND 0 MO6 C/R (SUTURE) ×3 IMPLANT
SUT FIBERWIRE #2 38 BLUE 1/2 (SUTURE) ×12
SUT VIC AB 0 CT1 36 (SUTURE) ×3 IMPLANT
SUT VIC AB 2-0 CT1 27 (SUTURE) ×4
SUT VIC AB 2-0 CT1 TAPERPNT 27 (SUTURE) ×2 IMPLANT
SUTURE FIBERWR #2 38 BLUE 1/2 (SUTURE) ×4 IMPLANT
SYR 10ML LL (SYRINGE) ×3 IMPLANT
SYR 30ML LL (SYRINGE) IMPLANT
TIP FAN IRRIG PULSAVAC PLUS (DISPOSABLE) ×1 IMPLANT
TRAY HUM MINI SHOULDER +0 40D (Shoulder) ×3 IMPLANT
WRAPON POLAR PAD SHDR UNIV (MISCELLANEOUS) ×3

## 2019-10-21 NOTE — Discharge Instructions (Addendum)
Orthopedic discharge instructions: May shower with intact OpSite dressing.  Apply ice frequently to shoulder or use Polar Care device. Take Lodine 500 mg BID OR ibuprofen 600-800 mg TID with meals for 7-10 days, then as necessary. Take oxycodone as prescribed when needed.  May supplement with ES Tylenol if necessary. Keep shoulder immobilizer on at all times except may remove for bathing purposes and for exercises. Follow-up in 10-14 days or as scheduled.    AMBULATORY SURGERY  DISCHARGE INSTRUCTIONS   1) The drugs that you were given will stay in your system until tomorrow so for the next 24 hours you should not:  A) Drive an automobile B) Make any legal decisions C) Drink any alcoholic beverage   2) You may resume regular meals tomorrow.  Today it is better to start with liquids and gradually work up to solid foods.  You may eat anything you prefer, but it is better to start with liquids, then soup and crackers, and gradually work up to solid foods.   3) Please notify your doctor immediately if you have any unusual bleeding, trouble breathing, redness and pain at the surgery site, drainage, fever, or pain not relieved by medication.    4) Additional Instructions:        Please contact your physician with any problems or Same Day Surgery at [phone], Monday through Friday 6 am to 4 pm, or [place] [facility] number at [phone].      Interscalene Nerve Block with Exparel  1.  For your surgery you have received an Interscalene Nerve Block with Exparel. 2. Nerve Blocks affect many types of nerves, including nerves that control movement, pain and normal sensation.  You may experience feelings such as numbness, tingling, heaviness, weakness or the inability to move your arm or the feeling or sensation that your arm has "fallen asleep". 3. A nerve block with Exparel can last up to 5 days.  Usually the weakness wears off first.  The tingling and heaviness  usually wear off next.  Finally you may start to notice pain.  Keep in mind that this may occur in any order.  Once a nerve block starts to wear off it is usually completely gone within 60 minutes. 4. ISNB may cause mild shortness of breath, a hoarse voice, blurry vision, unequal pupils, or drooping of the face on the same side as the nerve block.  These symptoms will usually resolve with the numbness.  Very rarely the procedure itself can cause mild seizures. 5. If needed, your surgeon will give you a prescription for pain medication.  It will take about 60 minutes for the oral pain medication to become fully effective.  So, it is recommended that you start taking this medication before the nerve block first begins to wear off, or when you first begin to feel discomfort. 6. Take your pain medication only as prescribed.  Pain medication can cause sedation and decrease your breathing if you take more than you need for the level of pain that you have. 7. Nausea is a common side effect of many pain medications.  You may want to eat something before taking your pain medicine to prevent nausea. 8. After an Interscalene nerve block, you cannot feel pain, pressure or extremes in temperature in the effected arm.  Because your arm is numb it is at an increased risk for injury.  To decrease the possibility of injury, please practice the following:  a. While you are awake change the position  of your arm frequently to prevent too much pressure on any one area for prolonged periods of time. b.  If you have a cast or tight dressing, check the color or your fingers every couple of hours.  Call your surgeon with the appearance of any discoloration (white or blue). c. If you are given a sling to wear before you go home, please wear it  at all times until the block has completely worn off.  Do not get up at night without your sling. d. Please contact [facility] or your surgeon if you do not begin to regain sensation  after 7 days from the surgery.  Anesthesia may be contacted by calling the Same Day Surgery Department, Mon. through Fri., 6 am to 4 pm at [phone].   e. If you experience any other problems or concerns, please contact your surgeon's office. f. If you experience severe or prolonged shortness of breath go to the nearest emergency department.

## 2019-10-21 NOTE — Anesthesia Preprocedure Evaluation (Signed)
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  JA CVA, Residual Symptoms    GI/Hepatic Neg liver ROS, GERD  Medicated,  Endo/Other  negative endocrine ROSdiabetes, Well Controlled, Type 2, Oral Hypoglycemic Agents  Renal/GU negative Renal ROS  negative genitourinary   Musculoskeletal   Abdominal   Peds  Hematology  (+) Blood dyscrasia, anemia ,   Anesthesia Other Findings Past Medical History: No date: Anxiety     Comment:  "stress" No date: Arthritis     Comment:  hands No date: BPH (benign prostatic hyperplasia) No date: Depression 02/2017: Diabetes mellitus without complication (HCC)     Comment:  Type II No date: ED (erectile dysfunction) No date: GERD (gastroesophageal reflux disease) No date: H/O calcium pyrophosphate deposition disease (CPPD) No date: HLD (hyperlipidemia) No date: Hypertension No date: Hypogonadism in male No date: Lower urinary tract symptoms (LUTS) No date: Nocturia No date: Stroke [facility])     Comment:  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  Right side weakness- improved No date: Testosterone overdose No date: Vitamin D deficiency Past Surgical History: 1986: BACK SURGERY No date: COLON SURGERY     Comment:  Colon Resection No date:  COLONOSCOPY 06/14/2017: IR ANGIO INTRA EXTRACRAN SEL COM CAROTID INNOMINATE BILAT  MOD SED 03/07/2017: IR ANGIO INTRA EXTRACRAN SEL COM CAROTID INNOMINATE UNI L  MOD SED 02/16/2017: IR ANGIO INTRA EXTRACRAN SEL COM CAROTID INNOMINATE UNI R  MOD SED 03/07/2017: IR ANGIO VERTEBRAL SEL SUBCLAVIAN INNOMINATE UNI L MOD SED 06/14/2017: IR ANGIO VERTEBRAL SEL SUBCLAVIAN INNOMINATE UNI L MOD SED 03/07/2017: IR ANGIO VERTEBRAL SEL VERTEBRAL UNI R MOD SED 06/14/2017: IR ANGIO VERTEBRAL SEL VERTEBRAL UNI R MOD SED 03/07/2017: IR INFUSION THROMBOL ARTERIAL INITIAL (MS) 03/07/2017: IR INTRAVSC STENT CERV CAROTID W/EMB-PROT MOD SED INCL ANGIO 02/16/2017: IR PERCUTANEOUS ART THROMBECTOMY/INFUSION INTRACRANIAL INC  DIAG ANGIO 02/16/2017: IR PTA NON CORO-LOWER EXTREM 04/04/2017: IR RADIOLOGIST EVAL & MGMT 07/05/2017: IR RADIOLOGIST EVAL & MGMT 12/07/2017: IR RADIOLOGIST EVAL & MGMT 06/20/2017: IR TRANSCATH EXCRAN VERT OR CAR A STENT 02/16/2017: IR US GUIDE VASC ACCESS LEFT 02/16/2017: IR US GUIDE VASC ACCESS RIGHT No date: KNEE ARTHROSCOPY; Bilateral 02/16/2017: RADIOLOGY WITH ANESTHESIA; N/A     Comment:  Procedure: RADIOLOGY WITH ANESTHESIA;  Surgeon: Luanne Bras, MD;  Location: [place];  Service: Radiology;                Laterality: N/A; 03/07/2017: RADIOLOGY WITH ANESTHESIA; N/A     Comment:  Procedure: CAROTID STENT;  Surgeon: Luanne Bras,               MD;  Location: [place];  Service: Radiology;  Laterality:  N/A; 06/20/2017: RADIOLOGY WITH ANESTHESIA; N/A     Comment:  Procedure: RADIOLOGY WITH ANESTHESIA STENTING;  Surgeon:              Julieanne Cotton, MD;  Location: [facility];  Service:               Radiology;  Laterality: N/A; No date: ROTATOR CUFF REPAIR; Right     Comment:  x 2 10/22/2018: SHOULDER ARTHROSCOPY WITH ROTATOR CUFF REPAIR AND  SUBACROMIAL DECOMPRESSION; Left     Comment:  Procedure: SHOULDER ARTHROSCOPY, GELNOID HUMERAL               DEDRIDEMENT, BICEPS  TENOTOMY;  Surgeon: Erin Sons,              MD;  Location: [facility];  Service: Orthopedics;                Laterality: Left;   Reproductive/Obstetrics negative OB ROS                             Anesthesia Physical Anesthesia Plan  ASA: III  Anesthesia Plan: General ETT   Post-op Pain Management:  Regional for Post-op pain   Induction:   PONV Risk Score and Plan:   Airway Management Planned:   Additional Equipment:   Intra-op Plan:   Post-operative Plan:   Informed Consent: I have reviewed the patients History and Physical, chart, labs and discussed the procedure including the risks, benefits and alternatives for the proposed anesthesia with the patient or authorized representative who has indicated his/her understanding and acceptance.     Dental Advisory Given  Plan Discussed with: CRNA  Anesthesia Plan Comments: (Secondary to residual stroke related upper extr. Weakness and based on our conversation with the pt this am., we will defer on isnb unless I feel the block is indicated in  pacu for pain management...  Patient agrees with this plan.  JA)        Anesthesia Quick Evaluation

## 2019-10-21 NOTE — Op Note (Signed)
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  Post-Op Diagnosis:   Same  Procedure:   Reverse right total shoulder arthroplasty.  Surgeon:   Pascal Lux, MD  Assistant:   Cameron Proud, PA-C  Anesthesia:   GET  Findings:   As above.  Complications:   None  EBL:    250 cc  Fluids:   700 cc crystalloid  UOP:   None  TT:   None  Drains:   None  Closure:   Staples  Implants:   All press-fit Biomet Comprehensive system with a #15 micro-humeral stem, a 40 mm humeral tray with a standard insert, and a mini-base plate with a 40 mm glenosphere.  Brief Clinical Note:   The patient is a 63 year old male with a long history of progressively worsening right shoulder pain. He is status post an open [employer] repair as well as a mini open rotator cuff repair in the 1990s. He was doing well following these procedures until he reinjured his shoulder over 2 years ago after suffering a stroke and falling onto her shoulder. His symptoms have progressed despite medications, activity modification, etc. His history and examination consistent with advanced cuff arthropathy with a massive irreparable rotator cuff tear, all of which were suggested by plain radiographs and confirmed by a preoperative MRI scan. The patient presents at this time for a reverse right total shoulder arthroplasty.  Procedure:   The patient underwent placement of an interscalene block by the anesthesiologist in the preoperative holding area before being brought into the operating room and lain in the supine position. The patient then underwent general endotracheal intubation and anesthesia before the patient was repositioned in the beach chair position using the beach chair positioner. The right shoulder and upper extremity were prepped with ChloraPrep solution before being draped sterilely. Preoperative antibiotics  were administered. A standard anterior approach to the shoulder was made through an approximately 4-5 inch incision. The incision was carried down through the subcutaneous tissues to expose the deltopectoral fascia. The interval between the deltoid and pectoralis muscles was identified and this plane developed, retracting the cephalic vein laterally with the deltoid muscle. The conjoined tendon was identified. Its lateral margin was dissected and the Kolbel self-retraining retractor inserted. The "three sisters" were identified and cauterized. Bursal tissues were removed to improve visualization. The subscapularis tendon was not existent, having chronically torn and retracted medially, so this could not be salvaged. The inferior capsule was released with care after identifying and protecting the axillary nerve. The proximal humeral cut was made at approximately 25 of retroversion using the extra-medullary guide.   Attention was redirected to the glenoid. The labrum was debrided circumferentially before the center of the glenoid was marked with electrocautery. The guidewire was drilled into the glenoid neck using the appropriate guide. After verifying its position, it was overreamed with the mini-baseplate reamer to create a flat surface. The permanent mini-baseplate was impacted into place. It was stabilized with a 20 x 6.5 mm central screw and four peripheral locking screws. The permanent 40 mm glenosphere was then impacted into place and its Morse taper locking mechanism verified using manual distraction.  Attention was directed to the humeral side. The humeral canal was reamed sequentially beginning with the end-cutting reamer then progressing from a 4 mm reamer up to a 15 mm reamer. This provided excellent circumferential chatter. The canal was broached  beginning with a #13 broach and progressing to a #15 broach. This was left in place and a trial reduction performed using the standard trial humeral  platform. The arm demonstrated excellent range of motion as the hand could be brought across the chest to the opposite shoulder and brought to the top of the patient's head and to the patient's ear. The shoulder appeared stable throughout this range of motion. The joint was dislocated and the trial components removed. The permanent #15 micro-stem was impacted into place with care taken to maintain the appropriate version. The permanent 40 mm humeral platform with the standard insert was put together on the back table and impacted into place. Again, the [facility] taper locking mechanism was verified using manual distraction. The shoulder was relocated using two finger pressure and again placed through a range of motion with the findings as described above.  The wound was copiously irrigated with sterile saline solution using the jet lavage system before a total of 30 cc of 0.5% Sensorcaine with epinephrine was injected into the pericapsular and peri-incisional tissues to help with postoperative analgesia. The deltopectoral interval was closed using #0 Vicryl interrupted sutures before the subcutaneous tissues were closed using 2-0 Vicryl interrupted sutures. The skin was closed using staples. Prior to closing the skin, 1 g of transexemic acid in 10 cc of normal saline was injected intra-articularly to help with postoperative bleeding. A sterile occlusive dressing was applied to the wound before the arm was placed into a shoulder immobilizer with an abduction pillow. A Polar Care system also was applied to the shoulder. The patient was then transferred back to a hospital bed before being awakened, extubated, and returned to the recovery room in satisfactory condition after tolerating the procedure well.

## 2019-10-21 NOTE — Progress Notes (Signed)
Right hand pink in color, warm to touch and patient able to move fingers wnl.

## 2019-10-21 NOTE — Evaluation (Signed)
Physical Therapy Evaluation Patient Details Name: Erik [name] MRN: [id number] DOB: [birth year] Today's Date: 10/21/2019   History of Present Illness  63 y/o male s/p R reverse total shoulder this morning 12/15.  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  L shld RTC repair lat year.  Clinical Impression  Pt seen POD0 in post-op.  He had gotten bolus and pain meds and generally was feeling well post R reverse total shoulder.  He has good mobility, balance and activity tolerance despite some residual R sided weakness from CVA a few years ago.  Pt had L RTC repair last year and reports he feels confident about precautions, exercises, dressing, ADLs, etc - issued HEP and educational handouts.  Pt was able to ambulate 150-200 ft w/o AD and w/o safety issues as well as negotiate up/down steps with single rail and no assist.  Pt did well and is eager to go home.     Follow Up Recommendations Follow surgeon's recommendation for DC plan and follow-up therapies(isn't interested in HHPT knows he'll need outpt in a few wks)    Equipment Recommendations    none recommended by PT   Recommendations for Other Services       Precautions / Restrictions Precautions Precautions: Shoulder Type of Shoulder Precautions: total shoulder Precaution Booklet Issued: Yes (comment) Required Braces or Orthoses: Sling Restrictions Weight Bearing Restrictions: Yes RUE Weight Bearing: Non weight bearing      Mobility  Bed Mobility               General bed mobility comments: In recliner on arrival  Transfers Overall transfer level: Modified independent Equipment used: None             General transfer comment: Pt able to rise from seated X5 t/o session w/o phyiscal assist and only minimal cuing and CGA  Ambulation/Gait Ambulation/Gait assistance: Supervision Gait Distance (Feet): 150 Feet Assistive device: None       General Gait Details: Pt was able to ambulate  ~50 ft, then do steps and an additional 150 ft w/o AD, w/o LOBs and w/o need for direct assist.  Pt reports feeling essentially back to baseline with some expected post-op hesitancy but no overt safety issues. He does have R sided limp that is apparently his baseline since CVA.  Stairs Stairs: Yes Stairs assistance: Supervision Stair Management: One rail Left;Forwards Number of Stairs: 4 General stair comments: PT able to negotiate up/down steps w/ good confidence, no assist and w/o safety issues  Wheelchair Mobility    Modified Rankin (Stroke Patients Only)       Balance Overall balance assessment: Modified Independent                                           Pertinent Vitals/Pain Pain Location: reports a little bit of general R sided pain with movement but no functional limitations    Home Living Family/patient expects to be discharged to:: Private residence Living Arrangements: Spouse/significant other Available Help at Discharge: Family(daughter lives near by and can/will assist as needed) Type of Home: House Home Access: Stairs to enter Entrance Stairs-Rails: Chemical engineer of Steps: 5 Home Layout: Two level;Full bath on main level Home Equipment: Cane - single point      Prior Function Level of Independence: Independent         Comments:  Pt with some residual R sided weakness from CVA 2-3 years ago and mild limp, but is independent with driving/working/etc w/o assist     Hand Dominance        Extremity/Trunk Assessment   Upper Extremity Assessment Upper Extremity Assessment: Overall WFL for tasks assessed(R in sling, L functional t/o)    Lower Extremity Assessment Lower Extremity Assessment: Overall WFL for tasks assessed(functional t/o, R grossly weaker than L since CVA)       Communication   Communication: No difficulties(chronic slurred speech from CVA)  Cognition Arousal/Alertness: Awake/alert Behavior  During Therapy: WFL for tasks assessed/performed Overall Cognitive Status: Within Functional Limits for tasks assessed                                        General Comments      Exercises     Assessment/Plan    PT Assessment Patient needs continued PT services  PT Problem List Decreased strength;Decreased range of motion;Decreased activity tolerance;Decreased balance;Decreased coordination;Decreased mobility;Decreased knowledge of use of DME;Decreased safety awareness;Pain       PT Treatment Interventions Gait training;Stair training;Functional mobility training;Therapeutic activities;Therapeutic exercise;Balance training;Neuromuscular re-education;Patient/family education    PT Goals (Current goals can be found in the Care Plan section)  Acute Rehab PT Goals Patient Stated Goal: go home PT Goal Formulation: With patient Time For Goal Achievement: 11/04/19 Potential to Achieve Goals: Good    Frequency BID   Barriers to discharge        Co-evaluation               AM-PAC PT "6 Clicks" Mobility  Outcome Measure Help needed turning from your back to your side while in a flat bed without using bedrails?: A Little Help needed moving from lying on your back to sitting on the side of a flat bed without using bedrails?: A Little Help needed moving to and from a bed to a chair (including a wheelchair)?: None Help needed standing up from a chair using your arms (e.g., wheelchair or bedside chair)?: None Help needed to walk in hospital room?: None Help needed climbing 3-5 steps with a railing? : None 6 Click Score: 22    End of Session   Activity Tolerance: Patient tolerated treatment well Patient left: in chair;with nursing/sitter in room Nurse Communication: Mobility status PT Visit Diagnosis: Muscle weakness (generalized) (M62.81);Pain Pain - Right/Left: Right Pain - part of body: Shoulder    Time: 5681-2751 PT Time Calculation (min) (ACUTE  ONLY): 27 min   Charges:   PT Evaluation $PT Eval Low Complexity: 1 Low PT Treatments $Gait Training: 8-22 mins        Malachi Pro, DPT 10/21/2019, 2:38 PM

## 2019-10-21 NOTE — Anesthesia Procedure Notes (Signed)
Procedure Name: Intubation Date/Time: 10/21/2019 8:06 AM Performed by: Lily Peer, Summer, RN Pre-anesthesia Checklist: Patient identified, Patient being monitored, Timeout performed, Emergency Drugs available and Suction available Patient Re-evaluated:Patient Re-evaluated prior to induction Oxygen Delivery Method: Circle system utilized Preoxygenation: Pre-oxygenation with 100% oxygen Induction Type: IV induction Ventilation: Mask ventilation without difficulty Laryngoscope Size: McGraph and 4 Grade View: Grade I Tube type: Oral Tube size: 7.5 mm Number of attempts: 1 Airway Equipment and Method: Stylet Placement Confirmation: ETT inserted through vocal cords under direct vision,  positive ETCO2 and breath sounds checked- equal and bilateral Secured at: 21 cm Tube secured with: Tape Dental Injury: Teeth and Oropharynx as per pre-operative assessment

## 2019-10-21 NOTE — Anesthesia Procedure Notes (Signed)
Anesthesia Regional Block: Interscalene brachial plexus block   Pre-Anesthetic Checklist: ,, timeout performed, Correct Patient, Correct Site, Correct Laterality, Correct Procedure, Correct Position, site marked, Risks and benefits discussed,  Surgical consent,  Pre-op evaluation,  At surgeon's request and post-op pain management  Laterality: Right  Prep: chloraprep       Needles:  Injection technique: Single-shot  Needle Type: Echogenic Stimulator Needle     Needle Length: 10cm  Needle Gauge: 20     Additional Needles:   Procedures:, nerve stimulator,,, ultrasound used (permanent image in chart),,,,  Narrative:  Start time: 10/21/2019 11:30 AM End time: 10/21/2019 11:36 AM Injection made incrementally with aspirations every 5 mL.  Performed by: Personally   Additional Notes: Request by Dr. Benson Setting intraop to perform block to aid in postop pain management.  As discussed and okd by patient preop and in pacu prior to injection, we will comply with request and place an isnb.  Functioning IV was confirmed and monitors were applied. Sterile prep and drape,hand hygiene and sterile gloves were used.  Negative aspiration and negative test dose prior to incremental administration of local anesthetic. The patient tolerated the procedure well.

## 2019-10-21 NOTE — Anesthesia Post-op Follow-up Note (Deleted)
Anesthesia QCDR form completed.        

## 2019-10-21 NOTE — H&P (Signed)
Paper H&P to be scanned into permanent record. H&P reviewed and patient re-examined. No changes. 

## 2019-10-21 NOTE — OR Nursing (Signed)
Dr. Roland Rack in to see pt Erik [name], advises ok to d/c to home.

## 2019-10-21 NOTE — Progress Notes (Signed)
Xray completed

## 2019-10-21 NOTE — Anesthesia Post-op Follow-up Note (Signed)
Anesthesia QCDR form completed.        

## 2019-10-21 NOTE — Transfer of Care (Signed)
Immediate Anesthesia Transfer of Care Note  Patient: JAGAR LUA  Procedure(s) Performed: REVERSE SHOULDER ARTHROPLASTY (Right Shoulder)  Patient Location: PACU  Anesthesia Type:General  Level of Consciousness: sedated  Airway & Oxygen Therapy: Patient Spontanous Breathing and Patient connected to face mask oxygen  Post-op Assessment: Report given to RN and Post -op Vital signs reviewed and stable  Post vital signs: Reviewed  Last Vitals:  Vitals Value Taken Time  BP 117/63 10/21/19 1035  Temp    Pulse 67 10/21/19 1036  Resp 12 10/21/19 1035  SpO2 99 % 10/21/19 1036  Vitals shown include unvalidated device data.  Last Pain:  Vitals:   10/21/19 0615  TempSrc: Temporal  PainSc: 0-No pain         Complications: No apparent anesthesia complications

## 2019-10-21 NOTE — OR Nursing (Signed)
Physical Therapy in for evaluation 140 pm.

## 2019-10-22 LAB — SURGICAL PATHOLOGY

## 2019-10-24 ENCOUNTER — Other Ambulatory Visit: Payer: Managed Care, Other (non HMO)

## 2019-10-27 NOTE — Anesthesia Postprocedure Evaluation (Signed)
Anesthesia Post Note  Patient: Erik [name]  Procedure(s) Performed: REVERSE SHOULDER ARTHROPLASTY (Right Shoulder)  Patient location during evaluation: PACU Anesthesia Type: General Level of consciousness: awake and alert Pain management: pain level controlled Vital Signs Assessment: post-procedure vital signs reviewed and stable Respiratory status: spontaneous breathing, nonlabored ventilation, respiratory function stable and patient connected to nasal cannula oxygen Cardiovascular status: blood pressure returned to baseline and stable Postop Assessment: no apparent nausea or vomiting Anesthetic complications: no     Last Vitals:  Vitals:   10/21/19 1448 10/21/19 1531  BP: (!) 108/55 131/60  Pulse: 78 77  Resp: 16 14  Temp:    SpO2: 95% 96%    Last Pain:  Vitals:   10/22/19 0953  TempSrc:   PainSc: 0-No pain                 Molli Barrows

## 2022-07-21 DIAGNOSIS — Z125 Encounter for screening for malignant neoplasm of prostate: Secondary | ICD-10-CM | POA: Diagnosis not present

## 2022-07-21 DIAGNOSIS — E1149 Type 2 diabetes mellitus with other diabetic neurological complication: Secondary | ICD-10-CM | POA: Diagnosis not present

## 2022-07-26 ENCOUNTER — Other Ambulatory Visit: Payer: Self-pay

## 2022-07-28 DIAGNOSIS — Z23 Encounter for immunization: Secondary | ICD-10-CM | POA: Diagnosis not present

## 2022-07-28 DIAGNOSIS — K219 Gastro-esophageal reflux disease without esophagitis: Secondary | ICD-10-CM | POA: Diagnosis not present

## 2022-07-28 DIAGNOSIS — Z0000 Encounter for general adult medical examination without abnormal findings: Secondary | ICD-10-CM | POA: Diagnosis not present

## 2022-07-28 DIAGNOSIS — I10 Essential (primary) hypertension: Secondary | ICD-10-CM | POA: Diagnosis not present

## 2022-07-28 DIAGNOSIS — I69351 Hemiplegia and hemiparesis following cerebral infarction affecting right dominant side: Secondary | ICD-10-CM | POA: Diagnosis not present

## 2022-07-28 DIAGNOSIS — Z0001 Encounter for general adult medical examination with abnormal findings: Secondary | ICD-10-CM | POA: Diagnosis not present

## 2022-07-28 DIAGNOSIS — F419 Anxiety disorder, unspecified: Secondary | ICD-10-CM | POA: Diagnosis not present

## 2022-07-28 DIAGNOSIS — E785 Hyperlipidemia, unspecified: Secondary | ICD-10-CM | POA: Diagnosis not present

## 2022-07-28 DIAGNOSIS — F32A Depression, unspecified: Secondary | ICD-10-CM | POA: Diagnosis not present

## 2022-07-28 DIAGNOSIS — E1149 Type 2 diabetes mellitus with other diabetic neurological complication: Secondary | ICD-10-CM | POA: Diagnosis not present

## 2022-10-02 DIAGNOSIS — M50122 Cervical disc disorder at C5-C6 level with radiculopathy: Secondary | ICD-10-CM | POA: Diagnosis not present

## 2022-10-02 DIAGNOSIS — M9903 Segmental and somatic dysfunction of lumbar region: Secondary | ICD-10-CM | POA: Diagnosis not present

## 2022-10-02 DIAGNOSIS — M5134 Other intervertebral disc degeneration, thoracic region: Secondary | ICD-10-CM | POA: Diagnosis not present

## 2022-10-02 DIAGNOSIS — M9901 Segmental and somatic dysfunction of cervical region: Secondary | ICD-10-CM | POA: Diagnosis not present

## 2022-10-02 DIAGNOSIS — M9905 Segmental and somatic dysfunction of pelvic region: Secondary | ICD-10-CM | POA: Diagnosis not present

## 2022-10-02 DIAGNOSIS — M9902 Segmental and somatic dysfunction of thoracic region: Secondary | ICD-10-CM | POA: Diagnosis not present

## 2022-10-03 DIAGNOSIS — M5134 Other intervertebral disc degeneration, thoracic region: Secondary | ICD-10-CM | POA: Diagnosis not present

## 2022-10-03 DIAGNOSIS — M9901 Segmental and somatic dysfunction of cervical region: Secondary | ICD-10-CM | POA: Diagnosis not present

## 2022-10-03 DIAGNOSIS — M9905 Segmental and somatic dysfunction of pelvic region: Secondary | ICD-10-CM | POA: Diagnosis not present

## 2022-10-03 DIAGNOSIS — M9902 Segmental and somatic dysfunction of thoracic region: Secondary | ICD-10-CM | POA: Diagnosis not present

## 2022-10-03 DIAGNOSIS — M9903 Segmental and somatic dysfunction of lumbar region: Secondary | ICD-10-CM | POA: Diagnosis not present

## 2022-10-03 DIAGNOSIS — M50122 Cervical disc disorder at C5-C6 level with radiculopathy: Secondary | ICD-10-CM | POA: Diagnosis not present

## 2022-10-04 DIAGNOSIS — M9903 Segmental and somatic dysfunction of lumbar region: Secondary | ICD-10-CM | POA: Diagnosis not present

## 2022-10-04 DIAGNOSIS — M50122 Cervical disc disorder at C5-C6 level with radiculopathy: Secondary | ICD-10-CM | POA: Diagnosis not present

## 2022-10-04 DIAGNOSIS — M9902 Segmental and somatic dysfunction of thoracic region: Secondary | ICD-10-CM | POA: Diagnosis not present

## 2022-10-04 DIAGNOSIS — M9901 Segmental and somatic dysfunction of cervical region: Secondary | ICD-10-CM | POA: Diagnosis not present

## 2022-10-04 DIAGNOSIS — M9905 Segmental and somatic dysfunction of pelvic region: Secondary | ICD-10-CM | POA: Diagnosis not present

## 2022-10-04 DIAGNOSIS — M5134 Other intervertebral disc degeneration, thoracic region: Secondary | ICD-10-CM | POA: Diagnosis not present

## 2022-10-09 DIAGNOSIS — M9905 Segmental and somatic dysfunction of pelvic region: Secondary | ICD-10-CM | POA: Diagnosis not present

## 2022-10-09 DIAGNOSIS — M9901 Segmental and somatic dysfunction of cervical region: Secondary | ICD-10-CM | POA: Diagnosis not present

## 2022-10-09 DIAGNOSIS — M9903 Segmental and somatic dysfunction of lumbar region: Secondary | ICD-10-CM | POA: Diagnosis not present

## 2022-10-09 DIAGNOSIS — M50122 Cervical disc disorder at C5-C6 level with radiculopathy: Secondary | ICD-10-CM | POA: Diagnosis not present

## 2022-10-09 DIAGNOSIS — M5134 Other intervertebral disc degeneration, thoracic region: Secondary | ICD-10-CM | POA: Diagnosis not present

## 2022-10-09 DIAGNOSIS — M9902 Segmental and somatic dysfunction of thoracic region: Secondary | ICD-10-CM | POA: Diagnosis not present

## 2022-10-11 DIAGNOSIS — M9905 Segmental and somatic dysfunction of pelvic region: Secondary | ICD-10-CM | POA: Diagnosis not present

## 2022-10-11 DIAGNOSIS — M5134 Other intervertebral disc degeneration, thoracic region: Secondary | ICD-10-CM | POA: Diagnosis not present

## 2022-10-11 DIAGNOSIS — M9902 Segmental and somatic dysfunction of thoracic region: Secondary | ICD-10-CM | POA: Diagnosis not present

## 2022-10-11 DIAGNOSIS — M50122 Cervical disc disorder at C5-C6 level with radiculopathy: Secondary | ICD-10-CM | POA: Diagnosis not present

## 2022-10-11 DIAGNOSIS — M9903 Segmental and somatic dysfunction of lumbar region: Secondary | ICD-10-CM | POA: Diagnosis not present

## 2022-10-11 DIAGNOSIS — M9901 Segmental and somatic dysfunction of cervical region: Secondary | ICD-10-CM | POA: Diagnosis not present

## 2022-10-16 DIAGNOSIS — M5134 Other intervertebral disc degeneration, thoracic region: Secondary | ICD-10-CM | POA: Diagnosis not present

## 2022-10-16 DIAGNOSIS — M50122 Cervical disc disorder at C5-C6 level with radiculopathy: Secondary | ICD-10-CM | POA: Diagnosis not present

## 2022-10-16 DIAGNOSIS — M9905 Segmental and somatic dysfunction of pelvic region: Secondary | ICD-10-CM | POA: Diagnosis not present

## 2022-10-16 DIAGNOSIS — M9903 Segmental and somatic dysfunction of lumbar region: Secondary | ICD-10-CM | POA: Diagnosis not present

## 2022-10-16 DIAGNOSIS — M9902 Segmental and somatic dysfunction of thoracic region: Secondary | ICD-10-CM | POA: Diagnosis not present

## 2022-10-16 DIAGNOSIS — M9901 Segmental and somatic dysfunction of cervical region: Secondary | ICD-10-CM | POA: Diagnosis not present

## 2023-01-19 DIAGNOSIS — E1149 Type 2 diabetes mellitus with other diabetic neurological complication: Secondary | ICD-10-CM | POA: Diagnosis not present

## 2023-01-26 DIAGNOSIS — F419 Anxiety disorder, unspecified: Secondary | ICD-10-CM | POA: Diagnosis not present

## 2023-01-26 DIAGNOSIS — E1149 Type 2 diabetes mellitus with other diabetic neurological complication: Secondary | ICD-10-CM | POA: Diagnosis not present

## 2023-01-26 DIAGNOSIS — Z0000 Encounter for general adult medical examination without abnormal findings: Secondary | ICD-10-CM | POA: Diagnosis not present

## 2023-01-26 DIAGNOSIS — K219 Gastro-esophageal reflux disease without esophagitis: Secondary | ICD-10-CM | POA: Diagnosis not present

## 2023-01-26 DIAGNOSIS — Z125 Encounter for screening for malignant neoplasm of prostate: Secondary | ICD-10-CM | POA: Diagnosis not present

## 2023-01-26 DIAGNOSIS — F32A Depression, unspecified: Secondary | ICD-10-CM | POA: Diagnosis not present

## 2023-01-26 DIAGNOSIS — E785 Hyperlipidemia, unspecified: Secondary | ICD-10-CM | POA: Diagnosis not present

## 2023-01-26 DIAGNOSIS — I10 Essential (primary) hypertension: Secondary | ICD-10-CM | POA: Diagnosis not present

## 2023-01-26 DIAGNOSIS — I69351 Hemiplegia and hemiparesis following cerebral infarction affecting right dominant side: Secondary | ICD-10-CM | POA: Diagnosis not present

## 2023-07-27 DIAGNOSIS — E1149 Type 2 diabetes mellitus with other diabetic neurological complication: Secondary | ICD-10-CM | POA: Diagnosis not present

## 2023-07-27 DIAGNOSIS — Z125 Encounter for screening for malignant neoplasm of prostate: Secondary | ICD-10-CM | POA: Diagnosis not present

## 2023-08-01 DIAGNOSIS — F419 Anxiety disorder, unspecified: Secondary | ICD-10-CM | POA: Diagnosis not present

## 2023-08-01 DIAGNOSIS — E785 Hyperlipidemia, unspecified: Secondary | ICD-10-CM | POA: Diagnosis not present

## 2023-08-01 DIAGNOSIS — E1149 Type 2 diabetes mellitus with other diabetic neurological complication: Secondary | ICD-10-CM | POA: Diagnosis not present

## 2023-08-01 DIAGNOSIS — R4701 Aphasia: Secondary | ICD-10-CM | POA: Diagnosis not present

## 2023-08-01 DIAGNOSIS — F32A Depression, unspecified: Secondary | ICD-10-CM | POA: Diagnosis not present

## 2023-08-01 DIAGNOSIS — I10 Essential (primary) hypertension: Secondary | ICD-10-CM | POA: Diagnosis not present

## 2023-08-01 DIAGNOSIS — Z0001 Encounter for general adult medical examination with abnormal findings: Secondary | ICD-10-CM | POA: Diagnosis not present

## 2023-10-24 DIAGNOSIS — J029 Acute pharyngitis, unspecified: Secondary | ICD-10-CM | POA: Diagnosis not present

## 2024-01-23 DIAGNOSIS — E1149 Type 2 diabetes mellitus with other diabetic neurological complication: Secondary | ICD-10-CM | POA: Diagnosis not present

## 2024-01-30 DIAGNOSIS — I69322 Dysarthria following cerebral infarction: Secondary | ICD-10-CM | POA: Diagnosis not present

## 2024-01-30 DIAGNOSIS — K219 Gastro-esophageal reflux disease without esophagitis: Secondary | ICD-10-CM | POA: Diagnosis not present

## 2024-01-30 DIAGNOSIS — I10 Essential (primary) hypertension: Secondary | ICD-10-CM | POA: Diagnosis not present

## 2024-01-30 DIAGNOSIS — Z0000 Encounter for general adult medical examination without abnormal findings: Secondary | ICD-10-CM | POA: Diagnosis not present

## 2024-01-30 DIAGNOSIS — E785 Hyperlipidemia, unspecified: Secondary | ICD-10-CM | POA: Diagnosis not present

## 2024-01-30 DIAGNOSIS — F32A Depression, unspecified: Secondary | ICD-10-CM | POA: Diagnosis not present

## 2024-01-30 DIAGNOSIS — I69351 Hemiplegia and hemiparesis following cerebral infarction affecting right dominant side: Secondary | ICD-10-CM | POA: Diagnosis not present

## 2024-01-30 DIAGNOSIS — F419 Anxiety disorder, unspecified: Secondary | ICD-10-CM | POA: Diagnosis not present

## 2024-01-30 DIAGNOSIS — Z125 Encounter for screening for malignant neoplasm of prostate: Secondary | ICD-10-CM | POA: Diagnosis not present

## 2024-01-30 DIAGNOSIS — E1149 Type 2 diabetes mellitus with other diabetic neurological complication: Secondary | ICD-10-CM | POA: Diagnosis not present

## 2024-03-06 DIAGNOSIS — Z6830 Body mass index (BMI) 30.0-30.9, adult: Secondary | ICD-10-CM | POA: Diagnosis not present

## 2024-03-06 DIAGNOSIS — M65342 Trigger finger, left ring finger: Secondary | ICD-10-CM | POA: Diagnosis not present

## 2024-03-06 DIAGNOSIS — E1149 Type 2 diabetes mellitus with other diabetic neurological complication: Secondary | ICD-10-CM | POA: Diagnosis not present

## 2024-08-01 DIAGNOSIS — E1149 Type 2 diabetes mellitus with other diabetic neurological complication: Secondary | ICD-10-CM | POA: Diagnosis not present

## 2024-08-01 DIAGNOSIS — Z125 Encounter for screening for malignant neoplasm of prostate: Secondary | ICD-10-CM | POA: Diagnosis not present

## 2024-08-08 DIAGNOSIS — K219 Gastro-esophageal reflux disease without esophagitis: Secondary | ICD-10-CM | POA: Diagnosis not present

## 2024-08-08 DIAGNOSIS — I69351 Hemiplegia and hemiparesis following cerebral infarction affecting right dominant side: Secondary | ICD-10-CM | POA: Diagnosis not present

## 2024-08-08 DIAGNOSIS — F32A Depression, unspecified: Secondary | ICD-10-CM | POA: Diagnosis not present

## 2024-08-08 DIAGNOSIS — Z1331 Encounter for screening for depression: Secondary | ICD-10-CM | POA: Diagnosis not present

## 2024-08-08 DIAGNOSIS — E1149 Type 2 diabetes mellitus with other diabetic neurological complication: Secondary | ICD-10-CM | POA: Diagnosis not present

## 2024-08-08 DIAGNOSIS — E785 Hyperlipidemia, unspecified: Secondary | ICD-10-CM | POA: Diagnosis not present

## 2024-08-08 DIAGNOSIS — I69920 Aphasia following unspecified cerebrovascular disease: Secondary | ICD-10-CM | POA: Diagnosis not present

## 2024-08-08 DIAGNOSIS — Z0001 Encounter for general adult medical examination with abnormal findings: Secondary | ICD-10-CM | POA: Diagnosis not present

## 2024-08-08 DIAGNOSIS — I10 Essential (primary) hypertension: Secondary | ICD-10-CM | POA: Diagnosis not present

## 2024-08-08 DIAGNOSIS — F419 Anxiety disorder, unspecified: Secondary | ICD-10-CM | POA: Diagnosis not present

## 2024-08-25 DIAGNOSIS — M65342 Trigger finger, left ring finger: Secondary | ICD-10-CM | POA: Diagnosis not present

## 2024-08-25 DIAGNOSIS — M720 Palmar fascial fibromatosis [Dupuytren]: Secondary | ICD-10-CM | POA: Diagnosis not present

## 2024-08-25 DIAGNOSIS — E1149 Type 2 diabetes mellitus with other diabetic neurological complication: Secondary | ICD-10-CM | POA: Diagnosis not present

## 2024-08-25 DIAGNOSIS — Z6830 Body mass index (BMI) 30.0-30.9, adult: Secondary | ICD-10-CM | POA: Diagnosis not present
